# Patient Record
Sex: Female | Born: 1935 | Race: White | Hispanic: No | Smoking: Never smoker
Health system: Southern US, Community
[De-identification: ages and names within clinical notes are randomized; demographics above are authoritative.]

## PROBLEM LIST (undated history)

## (undated) DIAGNOSIS — I10 Essential (primary) hypertension: Secondary | ICD-10-CM

## (undated) DIAGNOSIS — E7800 Pure hypercholesterolemia, unspecified: Secondary | ICD-10-CM

## (undated) DIAGNOSIS — R251 Tremor, unspecified: Secondary | ICD-10-CM

## (undated) DIAGNOSIS — E119 Type 2 diabetes mellitus without complications: Secondary | ICD-10-CM

## (undated) HISTORY — PX: EYE SURGERY: SHX253

## (undated) HISTORY — PX: CHOLECYSTECTOMY: SHX55

## (undated) HISTORY — PX: BLADDER SUSPENSION: SHX72

## (undated) HISTORY — PX: BACK SURGERY: SHX140

## (undated) HISTORY — PX: TONSILLECTOMY: SUR1361

## (undated) HISTORY — PX: APPENDECTOMY: SHX54

## (undated) HISTORY — PX: ABDOMINAL HYSTERECTOMY: SHX81

---

## 2013-12-07 ENCOUNTER — Emergency Department (HOSPITAL_BASED_OUTPATIENT_CLINIC_OR_DEPARTMENT_OTHER)
Admission: EM | Admit: 2013-12-07 | Discharge: 2013-12-07 | Disposition: A | Discharge status: Home / Self Care | Payer: Medicare Other | Attending: Emergency Medicine | Admitting: Emergency Medicine

## 2013-12-07 ENCOUNTER — Emergency Department (HOSPITAL_BASED_OUTPATIENT_CLINIC_OR_DEPARTMENT_OTHER): Payer: Medicare Other

## 2013-12-07 ENCOUNTER — Encounter (HOSPITAL_BASED_OUTPATIENT_CLINIC_OR_DEPARTMENT_OTHER): Payer: Self-pay | Admitting: Emergency Medicine

## 2013-12-07 DIAGNOSIS — E119 Type 2 diabetes mellitus without complications: Secondary | ICD-10-CM | POA: Insufficient documentation

## 2013-12-07 DIAGNOSIS — S0993XA Unspecified injury of face, initial encounter: Secondary | ICD-10-CM

## 2013-12-07 DIAGNOSIS — Z79899 Other long term (current) drug therapy: Secondary | ICD-10-CM | POA: Insufficient documentation

## 2013-12-07 DIAGNOSIS — Z23 Encounter for immunization: Secondary | ICD-10-CM | POA: Insufficient documentation

## 2013-12-07 DIAGNOSIS — I10 Essential (primary) hypertension: Secondary | ICD-10-CM | POA: Insufficient documentation

## 2013-12-07 DIAGNOSIS — R930 Abnormal findings on diagnostic imaging of skull and head, not elsewhere classified: Secondary | ICD-10-CM | POA: Insufficient documentation

## 2013-12-07 DIAGNOSIS — E7800 Pure hypercholesterolemia, unspecified: Secondary | ICD-10-CM | POA: Insufficient documentation

## 2013-12-07 DIAGNOSIS — S199XXA Unspecified injury of neck, initial encounter: Secondary | ICD-10-CM

## 2013-12-07 DIAGNOSIS — W1809XA Striking against other object with subsequent fall, initial encounter: Secondary | ICD-10-CM | POA: Insufficient documentation

## 2013-12-07 DIAGNOSIS — S51809A Unspecified open wound of unspecified forearm, initial encounter: Secondary | ICD-10-CM | POA: Insufficient documentation

## 2013-12-07 DIAGNOSIS — Y9289 Other specified places as the place of occurrence of the external cause: Secondary | ICD-10-CM | POA: Insufficient documentation

## 2013-12-07 DIAGNOSIS — S51819A Laceration without foreign body of unspecified forearm, initial encounter: Secondary | ICD-10-CM

## 2013-12-07 DIAGNOSIS — Y9389 Activity, other specified: Secondary | ICD-10-CM | POA: Insufficient documentation

## 2013-12-07 HISTORY — DX: Type 2 diabetes mellitus without complications: E11.9

## 2013-12-07 HISTORY — DX: Pure hypercholesterolemia, unspecified: E78.00

## 2013-12-07 HISTORY — DX: Essential (primary) hypertension: I10

## 2013-12-07 MED ORDER — ACETAMINOPHEN 500 MG PO TABS
1000.0000 mg | ORAL_TABLET | Freq: Once | ORAL | Status: AC
  Administered 2013-12-07: 1000 mg via ORAL
  Filled 2013-12-07: qty 2

## 2013-12-07 MED ORDER — TETANUS-DIPHTH-ACELL PERTUSSIS 5-2.5-18.5 LF-MCG/0.5 IM SUSP
0.5000 mL | Freq: Once | INTRAMUSCULAR | Status: AC
  Administered 2013-12-07: 0.5 mL via INTRAMUSCULAR
  Filled 2013-12-07: qty 0.5

## 2013-12-07 NOTE — ED Notes (Signed)
Patient fell in her bathroom and has a wound to her right arm with pain in arm and shoulder, hit her head on tile floor.

## 2013-12-07 NOTE — Discharge Instructions (Signed)
Take tylenol for pain.   Wound check with your doctor in a week. Steri strips may fall off on its own.   Keep wound clean and dry.   Return to ER if you have purulent drainage from the wound, fever, severe pain, headache.

## 2013-12-07 NOTE — ED Notes (Signed)
I irrigated wound with normal saline, then weak solution of iodine with saline under slight pressure with tubing from i.v. and syringe. I pat dried the wound with 4x4 material, I then pulled skin flaps back into place with Steri-strips. I completed dressing the wound with non-adherant pad with a heavy wrap of large cotton webril, secured with tape. Patient's comfort greatly improved.

## 2013-12-07 NOTE — ED Provider Notes (Signed)
CSN: [id number]     Arrival date & time 12/07/13  1841 History  This chart was scribed for Richardean Canal, MD by Danella Maiers, ED Scribe. This patient was seen in room MH11/MH11 and the patient's care was started at 8:29 PM.    Chief Complaint  Patient presents with  . Fall   The history is provided by the patient. No language interpreter was used.   HPI Comments: Kiara Blair is a 80 y.o. female with a h/o DM who presents to the Emergency Department complaining of falling on the floor while getting out of the bathtub today. She states she hit her head on the bathroom tile floor but denies LOC. She also reports a wound to her left forearm. She is unsure of the date of her last tetanus vaccine. She is not on any blood thinners. She states her sugars have been good.   Past Medical History  Diagnosis Date  . Diabetes mellitus without complication   . Hypertension   . Hypercholesteremia    No past surgical history on file. No family history on file. History  Substance Use Topics  . Smoking status: Not on file  . Smokeless tobacco: Not on file  . Alcohol Use: Not on file   OB History   Grav Para Term Preterm Abortions TAB SAB Ect Mult Living                 Review of Systems  Skin: Positive for wound.  All other systems reviewed and are negative.      Allergies  Review of patient's allergies indicates no known allergies.  Home Medications   Current Outpatient Rx  Name  Route  Sig  Dispense  Refill  . amLODipine-benazepril (LOTREL) 5-20 MG per capsule   Oral   Take 1 capsule by mouth daily.         Marland Kitchen glyBURIDE-metformin (GLUCOVANCE) 5-500 MG per tablet   Oral   Take 1 tablet by mouth daily with breakfast.          BP 105/88  Pulse 82  Temp(Src) 98.6 F (37 C) (Oral)  Resp 18  Ht 5\' 3"  (1.6 m)  Wt 137 lb (62.143 kg)  BMI 24.27 kg/m2  SpO2 99% Physical Exam  Nursing note and vitals reviewed. Constitutional: She is oriented to person, place, and time. She  appears well-developed and well-nourished. No distress.  HENT:  Head: Normocephalic and atraumatic.  Mild tenderness on the left forehead and left cheek area but no obvious deformity.  Eyes: EOM are normal.  Neck: Normal range of motion. Neck supple.  Cardiovascular: Normal rate.   Pulmonary/Chest: Effort normal and breath sounds normal. No respiratory distress.  Musculoskeletal: Normal range of motion.  Bilateral hips normal ROM  Lymphadenopathy:    She has no cervical adenopathy.  Neurological: She is alert and oriented to person, place, and time.  Skin: Skin is warm and dry.  Wound to the left forearm 1 inch that goes down into the fat with surrounding skin abrasion. muscle fascia is intact.  Psychiatric: She has a normal mood and affect. Her behavior is normal.    ED Course  Procedures (including critical care time) Medications  Tdap (BOOSTRIX) injection 0.5 mL (0.5 mLs Intramuscular Given 12/07/13 2113)  acetaminophen (TYLENOL) tablet 1,000 mg (1,000 mg Oral Given 12/07/13 2112)    DIAGNOSTIC STUDIES: Oxygen Saturation is 99% on RA, normal by my interpretation.    COORDINATION OF CARE: 8:36 PM- Discussed treatment plan with  pt. Pt agrees to plan.  LACERATION REPAIR Performed by: Chaney Mallingavid Otis Burress, MD Consent: Verbal consent obtained. Risks and benefits: risks, benefits and alternatives were discussed Patient identity confirmed: provided demographic data Time out performed prior to procedure Prepped and Draped in normal sterile fashion Wound explored Laceration Location: left forearm Laceration Length: 3 cm No Foreign Bodies seen or palpated Anesthesia: none Irrigation method: syringe Amount of cleaning: standard Skin closure: steri strips Patient tolerance: Patient tolerated the procedure well with no immediate complications.     Labs Review Labs Reviewed - No data to display Imaging Review Dg Forearm Left  12/07/2013   CLINICAL DATA:  Fall.  Left forearm injury.  Left  forearm pain.  EXAM: LEFT FOREARM - 2 VIEW  COMPARISON:  None.  FINDINGS: Bandage is present over the radial aspect of the forearm. Osteopenia. Radius and ulna intact. No fracture is identified. 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  IMPRESSION: No osseous injury.   Electronically Signed   By: Andreas NewportGeoffrey  Lamke M.D.   On: 12/07/2013 19:30   Ct Head Wo Contrast  12/07/2013   CLINICAL DATA:  Fall.  EXAM: CT HEAD WITHOUT CONTRAST  CT MAXILLOFACIAL WITHOUT CONTRAST  TECHNIQUE: Multidetector CT imaging of the head and maxillofacial structures were performed using the standard protocol without intravenous contrast. Multiplanar CT image reconstructions of the maxillofacial structures were also generated.  COMPARISON:  None.  FINDINGS: CT HEAD FINDINGS  Skull and Sinuses:No calvarial fracture.  Sinuses described below.  Orbits: No acute abnormality.  Brain: No evidence of acute abnormality, such as acute infarction, hemorrhage, hydrocephalus, or mass lesion/mass effect. Age appropriate generalized cerebral volume loss.  CT MAXILLOFACIAL FINDINGS  Negative for acute facial fracture. No acute globe or retrobulbar injury. No significant soft tissue findings in the face.  Inflammatory mucosal thickening the paranasal sinuses, especially the frontoethmoidal recesses and anterior ethmoids. No sinus effusion. No bony erosion.  IMPRESSION: 1. No evidence of acute intracranial injury or facial fracture. 2. Chronic inflammatory paranasal sinus disease.   Electronically Signed   By: Tiburcio PeaJonathan  Watts M.D.   On: 12/07/2013 21:31   Ct Maxillofacial Wo Cm  12/07/2013   CLINICAL DATA:  Fall.  EXAM: CT HEAD WITHOUT CONTRAST  CT MAXILLOFACIAL WITHOUT CONTRAST  TECHNIQUE: Multidetector CT imaging of the head and maxillofacial structures were performed using the standard protocol without intravenous contrast. Multiplanar CT image reconstructions of the maxillofacial structures were also generated.   COMPARISON:  None.  FINDINGS: CT HEAD FINDINGS  Skull and Sinuses:No calvarial fracture.  Sinuses described below.  Orbits: No acute abnormality.  Brain: No evidence of acute abnormality, such as acute infarction, hemorrhage, hydrocephalus, or mass lesion/mass effect. Age appropriate generalized cerebral volume loss.  CT MAXILLOFACIAL FINDINGS  Negative for acute facial fracture. No acute globe or retrobulbar injury. No significant soft tissue findings in the face.  Inflammatory mucosal thickening the paranasal sinuses, especially the frontoethmoidal recesses and anterior ethmoids. No sinus effusion. No bony erosion.  IMPRESSION: 1. No evidence of acute intracranial injury or facial fracture. 2. Chronic inflammatory paranasal sinus disease.   Electronically Signed   By: Tiburcio PeaJonathan  Watts M.D.   On: 12/07/2013 21:31     EKG Interpretation None      MDM   Final diagnoses:  None  Kiara Blair is a 80 y.o. female here with mechanical fall with facial injury and L forearm laceration. CT head/face unremarkable. L forearm laceration had some skin missing. It approximates well so steri  strips applied after irrigation. Wound check in a week. Tetanus updated.   I personally performed the services described in this documentation, which was scribed in my presence. The recorded information has been reviewed and is accurate.   Richardean Canal, MD 12/07/13 2139

## 2014-07-15 ENCOUNTER — Emergency Department (HOSPITAL_BASED_OUTPATIENT_CLINIC_OR_DEPARTMENT_OTHER)
Admission: EM | Admit: 2014-07-15 | Discharge: 2014-07-15 | Disposition: A | Discharge status: Home / Self Care | Payer: Medicare Other | Attending: Emergency Medicine | Admitting: Emergency Medicine

## 2014-07-15 ENCOUNTER — Emergency Department (HOSPITAL_BASED_OUTPATIENT_CLINIC_OR_DEPARTMENT_OTHER): Payer: Medicare Other

## 2014-07-15 ENCOUNTER — Encounter (HOSPITAL_BASED_OUTPATIENT_CLINIC_OR_DEPARTMENT_OTHER): Payer: Self-pay | Admitting: Emergency Medicine

## 2014-07-15 DIAGNOSIS — W1830XA Fall on same level, unspecified, initial encounter: Secondary | ICD-10-CM | POA: Diagnosis not present

## 2014-07-15 DIAGNOSIS — R51 Headache: Secondary | ICD-10-CM | POA: Insufficient documentation

## 2014-07-15 DIAGNOSIS — S0993XA Unspecified injury of face, initial encounter: Secondary | ICD-10-CM | POA: Diagnosis present

## 2014-07-15 DIAGNOSIS — I10 Essential (primary) hypertension: Secondary | ICD-10-CM | POA: Diagnosis not present

## 2014-07-15 DIAGNOSIS — E041 Nontoxic single thyroid nodule: Secondary | ICD-10-CM | POA: Diagnosis not present

## 2014-07-15 DIAGNOSIS — S0083XA Contusion of other part of head, initial encounter: Secondary | ICD-10-CM

## 2014-07-15 DIAGNOSIS — Y92000 Kitchen of unspecified non-institutional (private) residence as  the place of occurrence of the external cause: Secondary | ICD-10-CM | POA: Insufficient documentation

## 2014-07-15 DIAGNOSIS — S0093XA Contusion of unspecified part of head, initial encounter: Secondary | ICD-10-CM | POA: Insufficient documentation

## 2014-07-15 DIAGNOSIS — Y9301 Activity, walking, marching and hiking: Secondary | ICD-10-CM | POA: Insufficient documentation

## 2014-07-15 DIAGNOSIS — E119 Type 2 diabetes mellitus without complications: Secondary | ICD-10-CM | POA: Insufficient documentation

## 2014-07-15 MED ORDER — TRAMADOL HCL 50 MG PO TABS: 50.0000 mg | ORAL_TABLET | Freq: Four times a day (QID) | ORAL | Status: DC | PRN

## 2014-07-15 NOTE — ED Notes (Addendum)
Pt reports falling at home and hit face against floor. Pt presents with left side bruises and injury to upper maxillary. Bleeding is controled. Pt denies dizziness, loss of consciousness or taking anticoagulants,. Hx of HTN and diabetes.

## 2014-07-15 NOTE — Discharge Instructions (Signed)
Contusion °A contusion is a deep bruise. Contusions are the result of an injury that caused bleeding under the skin. The contusion may turn blue, purple, or yellow. Minor injuries will give you a painless contusion, but more severe contusions may stay painful and swollen for a few weeks.  °CAUSES  °A contusion is usually caused by a blow, trauma, or direct force to an area of the body. °SYMPTOMS  °· Swelling and redness of the injured area. °· Bruising of the injured area. °· Tenderness and soreness of the injured area. °· Pain. °DIAGNOSIS  °The diagnosis can be made by taking a history and physical exam. An X-ray, CT scan, or MRI may be needed to determine if there were any associated injuries, such as fractures. °TREATMENT  °Specific treatment will depend on what area of the body was injured. In general, the best treatment for a contusion is resting, icing, elevating, and applying cold compresses to the injured area. Over-the-counter medicines may also be recommended for pain control. Ask your caregiver what the best treatment is for your contusion. °HOME CARE INSTRUCTIONS  °· Put ice on the injured area. °¨ Put ice in a plastic bag. °¨ Place a towel between your skin and the bag. °¨ Leave the ice on for 15-20 minutes, 3-4 times a day, or as directed by your health care provider. °· Only take over-the-counter or prescription medicines for pain, discomfort, or fever as directed by your caregiver. Your caregiver may recommend avoiding anti-inflammatory medicines (aspirin, ibuprofen, and naproxen) for 48 hours because these medicines may increase bruising. °· Rest the injured area. °· If possible, elevate the injured area to reduce swelling. °SEEK IMMEDIATE MEDICAL CARE IF:  °· You have increased bruising or swelling. °· You have pain that is getting worse. °· Your swelling or pain is not relieved with medicines. °MAKE SURE YOU:  °· Understand these instructions. °· Will watch your condition. °· Will get help right  away if you are not doing well or get worse. °Document Released: 07/05/2005 Document Revised: 09/30/2013 Document Reviewed: 07/31/2011 °[employer]® Patient Information ©2015 [employer]. This information is not intended to replace advice given to you by your health care provider. Make sure you discuss any questions you have with your health care provider. ° °

## 2014-07-15 NOTE — ED Provider Notes (Signed)
CSN: [id number]     Arrival date & time 07/15/14  4098 History   First MD Initiated Contact with Patient 07/15/14 [phone]     Chief Complaint  Patient presents with  . Fall  . Facial Injury    HPI Comments: The patient was walking around this morning in the kitchen. She was walking around on a linoleum floor in her stocking feet. She slipped and fell striking her face on the ground. She has a mild headache but primarily is having pain on the right side of her mouth in the maxillary region. She had blood in her mouth when she first got up. She denies any neck pain, confusion, numbness or weakness. She denies any other pain in her extremities. She has been able to walk without difficulty.  Patient is a 80 y.o. female presenting with fall and facial injury. The history is provided by the patient.  Fall This is a new problem. Episode onset: This morning. Associated symptoms include headaches.  Facial Injury Mechanism of injury:  Fall Location:  Face and mouth Mouth location:  Lip(s), teeth and gum(s) Pain details:    Quality:  Aching and sharp   Severity:  Mild   Timing:  Constant Chronicity:  New Foreign body present:  No foreign bodies Worsened by:  Pressure Associated symptoms: headaches   Associated symptoms: no altered mental status, no difficulty breathing, no double vision, no epistaxis, no malocclusion, no neck pain, no trismus and no vomiting  Loss of consciousness: she is not sure.     Past Medical History  Diagnosis Date  . Diabetes mellitus without complication   . Hypertension   . Hypercholesteremia    No past surgical history on file. No family history on file. History  Substance Use Topics  . Smoking status: Never Smoker   . Smokeless tobacco: Not on file  . Alcohol Use: Not on file   OB History   Grav Para Term Preterm Abortions TAB SAB Ect Mult Living                 Review of Systems  HENT: Negative for nosebleeds.   Eyes: Negative for double vision.    Gastrointestinal: Negative for vomiting.  Musculoskeletal: Negative for neck pain.  Neurological: Positive for headaches. Loss of consciousness: she is not sure.  All other systems reviewed and are negative.     Allergies  Review of patient's allergies indicates no known allergies.  Home Medications   Prior to Admission medications   Medication Sig Start Date End Date Taking? Authorizing Provider  amLODipine-benazepril (LOTREL) 5-20 MG per capsule Take 1 capsule by mouth daily.    Historical Provider, MD  glyBURIDE-metformin (GLUCOVANCE) 5-500 MG per tablet Take 1 tablet by mouth daily with breakfast.    Historical Provider, MD  traMADol (ULTRAM) 50 MG tablet Take 1 tablet (50 mg total) by mouth every 6 (six) hours as needed. 07/15/14   Linwood Dibbles, MD   BP 155/72  Pulse 78  Temp(Src) 98.2 F (36.8 C) (Oral)  Resp 16  Ht 5\' 3"  (1.6 m)  Wt 129 lb (58.514 kg)  BMI 22.86 kg/m2  SpO2 97% Physical Exam  Nursing note and vitals reviewed. Constitutional: She appears well-developed and well-nourished. No distress.  HENT:  Head: Normocephalic and atraumatic.    Right Ear: External ear normal.  Left Ear: External ear normal.  Small area of bruising and edema of the right upper lip, small superficial mucosal laceration in that same area, no gingival  bleeding, no displaced teeth  Eyes: Conjunctivae are normal. Right eye exhibits no discharge. Left eye exhibits no discharge. No scleral icterus.  Neck: Neck supple. No spinous process tenderness present. No tracheal deviation present.  Cardiovascular: Normal rate, regular rhythm and intact distal pulses.   Pulmonary/Chest: Effort normal and breath sounds normal. No stridor. No respiratory distress. She has no wheezes. She has no rales.  Abdominal: Soft. Bowel sounds are normal. She exhibits no distension. There is no tenderness. There is no rebound and no guarding.  Musculoskeletal: She exhibits no edema and no tenderness.       Cervical  back: Normal.       Thoracic back: Normal.       Lumbar back: Normal.  Full range of motion in all extremities without pain or discomfort  Neurological: She is alert. She has normal strength. No cranial nerve deficit (no facial droop, extraocular movements intact, no slurred speech) or sensory deficit. She exhibits normal muscle tone. She displays no seizure activity. Coordination normal.  Skin: Skin is warm and dry. No rash noted.  Psychiatric: She has a normal mood and affect.    ED Course  Procedures (including critical care time) Labs Review Labs Reviewed - No data to display  Imaging Review Ct Head Wo Contrast  07/15/2014   CLINICAL DATA:  Status post fall this morning striking the right side of the faint now will now with swelling of the right cheek and upper lip  EXAM: CT HEAD WITHOUT CONTRAST  CT MAXILLOFACIAL WITHOUT CONTRAST  CT CERVICAL SPINE WITHOUT CONTRAST  TECHNIQUE: Multidetector CT imaging of the head, cervical spine, and maxillofacial structures were performed using the standard protocol without intravenous contrast. Multiplanar CT image reconstructions of the cervical spine and maxillofacial structures were also generated.  COMPARISON:  CT of the brain and facial bones dated December 07, 2013.  FINDINGS: CT HEAD FINDINGS  The ventricles are normal in size and position. There is no intracranial hemorrhage nor intracranial mass effect. There is no acute ischemic change. The cerebellum and brainstem are normal.  The observed paranasal sinuses exhibit no air-fluid levels. There is mucoperiosteal thickening within the right and left frontal, several ethmoid, and sphenoid sinus cells. The mastoid air cells are well pneumatized. There is no acute skull fracture.  CT MAXILLOFACIAL FINDINGS  The nasal bone and nasal septum are intact. The zygomatic arches and pterygoid plates are intact. The bony orbits are intact. The paranasal sinus walls are normal. There are degenerative changes of the  temporomandibular joints bilaterally. There is no acute mandibular fracture.  CT CERVICAL SPINE FINDINGS  The cervical vertebral bodies are preserved in height. The prevertebral soft tissue spaces are normal. There is degenerative disc space narrowing at C2-3, C4-5, and C5-6. There are small endplate osteophytes anteriorly and laterally at these levels. There is no perched facet nor spinous process fracture. The odontoid is intact. The pulmonary apices are clear. There is marked enlargement of the right thyroid lobe with a dominant hypodense structure occupying much of its volume measuring 2.9 cm AP x 2.3 cm transversely x 3.6 cm longitudinally.  IMPRESSION: 1. There is no acute intracranial hemorrhage nor other acute intracranial abnormality. There is no acute skull fracture. 2. There is no acute fracture of the bony orbits nor of the other facial bones. 3. There is no acute cervical spine fracture nor dislocation. There is mild degenerative disc change at multiple levels. 4. There is enlargement of the right thyroid lobe consistent with a multinodular  goiter. The most recent ultrasound all record is from August of 2012. Elective follow-up ultrasound is recommended.   Electronically Signed   By: David  [place]   On: 07/15/2014 11:14   Ct Cervical Spine Wo Contrast  07/15/2014   CLINICAL DATA:  Initial encounter for fall this morning with swelling to right cheek and upper lip. Also with shoulder pain.  EXAM: CT CERVICAL SPINE WITHOUT CONTRAST  TECHNIQUE: Multidetector CT imaging of the cervical spine was performed without intravenous contrast. Multiplanar CT image reconstructions were also generated.  COMPARISON:  None.  FINDINGS: Imaging was obtained from the skullbase through the T2 vertebral body. No fracture. No subluxation. Loss of disc height is seen at C4-5 and C5-6. There is facet degeneration bilaterally at C4-5 with bilateral uncinate spurring at C5-6 causing bilateral neural foraminal encroachment at  C5-6. No prevertebral soft tissue swelling. Normal cervical lordosis is preserved.  3.0 cm right thyroid mass is evident. There is probably a smaller 10 mm nodule in the left thyroid gland.  IMPRESSION: Degenerative changes in the midcervical spine without cervical spine fracture.  3 cm right thyroid mass. Thyroid ultrasound recommended to further evaluate.   Electronically Signed   By: Kennith Center M.D.   On: 07/15/2014 11:12   Ct Maxillofacial Wo Cm  07/15/2014   CLINICAL DATA:  Status post fall this morning striking the right side of the faint now will now with swelling of the right cheek and upper lip  EXAM: CT HEAD WITHOUT CONTRAST  CT MAXILLOFACIAL WITHOUT CONTRAST  CT CERVICAL SPINE WITHOUT CONTRAST  TECHNIQUE: Multidetector CT imaging of the head, cervical spine, and maxillofacial structures were performed using the standard protocol without intravenous contrast. Multiplanar CT image reconstructions of the cervical spine and maxillofacial structures were also generated.  COMPARISON:  CT of the brain and facial bones dated December 07, 2013.  FINDINGS: CT HEAD FINDINGS  The ventricles are normal in size and position. There is no intracranial hemorrhage nor intracranial mass effect. There is no acute ischemic change. The cerebellum and brainstem are normal.  The observed paranasal sinuses exhibit no air-fluid levels. There is mucoperiosteal thickening within the right and left frontal, several ethmoid, and sphenoid sinus cells. The mastoid air cells are well pneumatized. There is no acute skull fracture.  CT MAXILLOFACIAL FINDINGS  The nasal bone and nasal septum are intact. The zygomatic arches and pterygoid plates are intact. The bony orbits are intact. The paranasal sinus walls are normal. There are degenerative changes of the temporomandibular joints bilaterally. There is no acute mandibular fracture.  CT CERVICAL SPINE FINDINGS  The cervical vertebral bodies are preserved in height. The prevertebral soft  tissue spaces are normal. There is degenerative disc space narrowing at C2-3, C4-5, and C5-6. There are small endplate osteophytes anteriorly and laterally at these levels. There is no perched facet nor spinous process fracture. The odontoid is intact. The pulmonary apices are clear. There is marked enlargement of the right thyroid lobe with a dominant hypodense structure occupying much of its volume measuring 2.9 cm AP x 2.3 cm transversely x 3.6 cm longitudinally.  IMPRESSION: 1. There is no acute intracranial hemorrhage nor other acute intracranial abnormality. There is no acute skull fracture. 2. There is no acute fracture of the bony orbits nor of the other facial bones. 3. There is no acute cervical spine fracture nor dislocation. There is mild degenerative disc change at multiple levels. 4. There is enlargement of the right thyroid lobe consistent with a multinodular  goiter. The most recent ultrasound all record is from August of 2012. Elective follow-up ultrasound is recommended.   Electronically Signed   By: David  [place]   On: 07/15/2014 11:14     EKG Interpretation None      MDM   Final diagnoses:  Facial contusion, initial encounter  Thyroid nodule    Follow up with PCP regarding thyroid nodule.  No facial fracture or serious injury associated with her fall.  Ultram prn.  Ice to help with swelling    Linwood Dibbles, MD 07/15/14 1126

## 2015-01-02 ENCOUNTER — Emergency Department (HOSPITAL_COMMUNITY): Payer: Medicare Other

## 2015-01-02 ENCOUNTER — Emergency Department (HOSPITAL_COMMUNITY)
Admission: EM | Admit: 2015-01-02 | Discharge: 2015-01-02 | Disposition: A | Discharge status: Home / Self Care | Payer: Medicare Other | Attending: Emergency Medicine | Admitting: Emergency Medicine

## 2015-01-02 ENCOUNTER — Encounter (HOSPITAL_COMMUNITY): Payer: Self-pay | Admitting: *Deleted

## 2015-01-02 DIAGNOSIS — I10 Essential (primary) hypertension: Secondary | ICD-10-CM | POA: Diagnosis not present

## 2015-01-02 DIAGNOSIS — E119 Type 2 diabetes mellitus without complications: Secondary | ICD-10-CM | POA: Insufficient documentation

## 2015-01-02 DIAGNOSIS — Z79899 Other long term (current) drug therapy: Secondary | ICD-10-CM | POA: Insufficient documentation

## 2015-01-02 DIAGNOSIS — R220 Localized swelling, mass and lump, head: Secondary | ICD-10-CM | POA: Insufficient documentation

## 2015-01-02 LAB — CBC
HCT: 39.9 % (ref 36.0–46.0)
Hemoglobin: 13 g/dL (ref 12.0–15.0)
MCH: 28.5 pg (ref 26.0–34.0)
MCHC: 32.6 g/dL (ref 30.0–36.0)
MCV: 87.5 fL (ref 78.0–100.0)
Platelets: 227 10*3/uL (ref 150–400)
RBC: 4.56 MIL/uL (ref 3.87–5.11)
RDW: 14.1 % (ref 11.5–15.5)
WBC: 6.4 10*3/uL (ref 4.0–10.5)

## 2015-01-02 LAB — BASIC METABOLIC PANEL
ANION GAP: 7 (ref 5–15)
BUN: 22 mg/dL (ref 6–23)
CO2: 26 mmol/L (ref 19–32)
CREATININE: 0.77 mg/dL (ref 0.50–1.10)
Calcium: 9.6 mg/dL (ref 8.4–10.5)
Chloride: 104 mmol/L (ref 96–112)
GFR calc Af Amer: 88 mL/min — ABNORMAL LOW (ref >90)
GFR calc non Af Amer: 76 mL/min — ABNORMAL LOW (ref >90)
GLUCOSE: 155 mg/dL — AB (ref 70–99)
Potassium: 4 mmol/L (ref 3.5–5.1)
SODIUM: 137 mmol/L (ref 135–145)

## 2015-01-02 MED ORDER — IOHEXOL 300 MG/ML  SOLN
100.0000 mL | Freq: Once | INTRAMUSCULAR | Status: AC | PRN
  Administered 2015-01-02: 100 mL via INTRAVENOUS

## 2015-01-02 MED ORDER — PENICILLIN V POTASSIUM 500 MG PO TABS: 500.0000 mg | ORAL_TABLET | Freq: Three times a day (TID) | ORAL | Status: DC

## 2015-01-02 NOTE — Discharge Instructions (Signed)
Return to the ED with any concerns including fever/chills, increased swelling, redness of face or eye, vomiting and not able to keep down liquids or pain medication, decreased level of alertness/lethargy, or any other alarming symptoms

## 2015-01-02 NOTE — ED Provider Notes (Signed)
CSN: [id number]     Arrival date & time 01/02/15  [id number] History   First MD Initiated Contact with Patient 01/02/15 1032     Chief Complaint  Patient presents with  . Facial Swelling     (Consider location/radiation/quality/duration/timing/severity/associated sxs/prior Treatment) HPI  Pt presenting with c/o right sided facial swelling when she awoke this morning.  She saw her right cheek swollen and puffy as well as her right eyelid and right upper lip.  She has a small amount of pain behind her right eye and small amount of pain in her right upper gum.  No fever/chills.  No changes in vision.  No pain with eye movements.  She has not had any treatment prior to arrival.  The swelling has mostly resolved after waking up today.  No blurry or double vision.  No focal weakness or numbness.  There are no other associated systemic symptoms, there are no other alleviating or modifying factors.   Past Medical History  Diagnosis Date  . Diabetes mellitus without complication   . Hypertension   . Hypercholesteremia    Past Surgical History  Procedure Laterality Date  . Abdominal hysterectomy    . Tonsillectomy    . Bladder suspension    . Appendectomy    . Cholecystectomy     No family history on file. History  Substance Use Topics  . Smoking status: Never Smoker   . Smokeless tobacco: Not on file  . Alcohol Use: No   OB History    No data available     Review of Systems  ROS reviewed and all otherwise negative except for mentioned in HPI    Allergies  Review of patient's allergies indicates no known allergies.  Home Medications   Prior to Admission medications   Medication Sig Start Date End Date Taking? Authorizing Provider  amLODipine-benazepril (LOTREL) 5-20 MG per capsule Take 1 capsule by mouth daily.   Yes Historical Provider, MD  Multiple Vitamin (MULTIVITAMIN WITH MINERALS) TABS tablet Take 1 tablet by mouth daily. Dr. Linton FlemingsWhitakers Vitamins.   Yes Historical Provider, MD   PROTEIN PO Take 1 each by mouth daily. Premium Protein shake.   Yes Historical Provider, MD  glyBURIDE-metformin (GLUCOVANCE) 5-500 MG per tablet Take 1 tablet by mouth daily with breakfast.    Historical Provider, MD  penicillin v potassium (VEETID) 500 MG tablet Take 1 tablet (500 mg total) by mouth 3 (three) times daily. 01/02/15   Jerelyn ScottMartha Linker, MD  traMADol (ULTRAM) 50 MG tablet Take 1 tablet (50 mg total) by mouth every 6 (six) hours as needed. Patient not taking: Reported on 01/02/2015 07/15/14   Linwood DibblesJon Knapp, MD   BP 148/51 mmHg  Pulse 69  Temp(Src) 98.4 F (36.9 C) (Oral)  Resp 17  SpO2 98%  Vitals reviewed Physical Exam  Physical Examination: General appearance - alert, well appearing, and in no distress Mental status - alert, oriented to person, place, and time Eyes - pupils equal and reactive, extraocular eye movements intact, no conjunctival injection Face- slight puffiness of right side of face- cheek/lip/eyelid, no erythema Mouth - mucous membranes moist, pharynx normal without lesions, ttp at superior margin of gingiva over lateral incisor- no frank abscess Neck - supple, no significant adenopathy Chest - clear to auscultation, no wheezes, rales or rhonchi, symmetric air entry Heart - normal rate, regular rhythm, normal S1, S2, no murmurs, rubs, clicks or gallops Neurological - alert, oriented x3, normal speech, no cranial nerve defect, strength 5/5 in extremities x  4, sensation intact Extremities - peripheral pulses normal, no pedal edema, no clubbing or cyanosis Skin - normal coloration and turgor, no rashes  ED Course  Procedures (including critical care time)  2:47 PM CT scan read did not cross over into epic.  D/w radiology- they have read CT scan as normal.  Labs Review Labs Reviewed  BASIC METABOLIC PANEL - Abnormal; Notable for the following:    Glucose, Bld 155 (*)    GFR calc non Af Amer 76 (*)    GFR calc Af Amer 88 (*)    All other components within normal  limits  CBC    Imaging Review Ct Orbits W/cm  01/02/2015   CLINICAL DATA:  Patient with facial swelling and right eye pain.  EXAM: CT ORBITS WITH CONTRAST  TECHNIQUE: Multidetector CT imaging of the orbits was performed following the bolus administration of intravenous contrast.  CONTRAST:  OMNIPAQUE IOHEXOL 300 MG/ML  SOLN  COMPARISON:  CT brain 07/15/2014  FINDINGS: The orbits are unremarkable bilaterally. There is no periorbital soft tissue swelling. The optic nerves are symmetric in size. No retrocrural fat stranding. There is mucosal thickening involving the frontal sinus, ethmoid air cells and right maxillary sinus. The calvarium is intact.  IMPRESSION: Grossly unremarkable examination. No periorbital or retrocrural soft tissue stranding/swelling.  Paranasal sinus mucosal thickening.   Electronically Signed   By: Annia Belt M.D.   On: 01/02/2015 13:20     EKG Interpretation None      MDM   Final diagnoses:  Facial swelling    Pt awoke this morning with facial swelling which has mostly resolved at this time.  She did have some eye redness- CT orbits show no orbital cellulitis.  There is small area of pain in upper gingiva just under nose- this may be  Source of infection or small abscess that caused swelling.  Pt placed on penicillin and advised to f/u with dentist.  Neuro exam is intact, no cranial nerve defect to suggest stroke.  No facial cellulitis.  Discharged with strict return precautions.  Pt agreeable with plan.    Jerelyn Scott, MD 01/03/15 [phone]

## 2015-01-02 NOTE — ED Notes (Addendum)
Pt reports waking up with R sided facial swelling and numbness and R eye "just not feeling right." Sts swelling has gone down and numbness has lessened but remains around R mouth and nose. Felt fine going to bed last night. Pt was concerned because her husband recently died shortly after having a stroke. Stroke screen negative in triage.

## 2015-10-22 DIAGNOSIS — G250 Essential tremor: Secondary | ICD-10-CM | POA: Insufficient documentation

## 2015-10-22 DIAGNOSIS — E785 Hyperlipidemia, unspecified: Secondary | ICD-10-CM | POA: Diagnosis present

## 2016-01-10 DIAGNOSIS — E1142 Type 2 diabetes mellitus with diabetic polyneuropathy: Secondary | ICD-10-CM | POA: Diagnosis present

## 2016-01-31 ENCOUNTER — Emergency Department (HOSPITAL_BASED_OUTPATIENT_CLINIC_OR_DEPARTMENT_OTHER): Payer: Medicare Other

## 2016-01-31 ENCOUNTER — Encounter (HOSPITAL_BASED_OUTPATIENT_CLINIC_OR_DEPARTMENT_OTHER): Payer: Self-pay | Admitting: *Deleted

## 2016-01-31 DIAGNOSIS — I10 Essential (primary) hypertension: Secondary | ICD-10-CM | POA: Insufficient documentation

## 2016-01-31 DIAGNOSIS — E119 Type 2 diabetes mellitus without complications: Secondary | ICD-10-CM | POA: Diagnosis not present

## 2016-01-31 DIAGNOSIS — Y939 Activity, unspecified: Secondary | ICD-10-CM | POA: Diagnosis not present

## 2016-01-31 DIAGNOSIS — S5011XA Contusion of right forearm, initial encounter: Secondary | ICD-10-CM | POA: Insufficient documentation

## 2016-01-31 DIAGNOSIS — Y929 Unspecified place or not applicable: Secondary | ICD-10-CM | POA: Diagnosis not present

## 2016-01-31 DIAGNOSIS — Y999 Unspecified external cause status: Secondary | ICD-10-CM | POA: Diagnosis not present

## 2016-01-31 DIAGNOSIS — W010XXA Fall on same level from slipping, tripping and stumbling without subsequent striking against object, initial encounter: Secondary | ICD-10-CM | POA: Insufficient documentation

## 2016-01-31 DIAGNOSIS — R42 Dizziness and giddiness: Secondary | ICD-10-CM | POA: Diagnosis present

## 2016-01-31 NOTE — ED Notes (Signed)
Dizziness x 2 weeks. She tripped and fell 2 weeks ago and sustained an injury to her right arm. Bruising and abrasion noted. She has had 20 pound weight loss since February. She came to the ED tonight because she feels nervous and not quite right in the head. No confusion just feels dizzy.

## 2016-02-01 ENCOUNTER — Emergency Department (HOSPITAL_BASED_OUTPATIENT_CLINIC_OR_DEPARTMENT_OTHER)
Admission: EM | Admit: 2016-02-01 | Discharge: 2016-02-01 | Disposition: A | Discharge status: Home / Self Care | Payer: Medicare Other | Attending: Emergency Medicine | Admitting: Emergency Medicine

## 2016-02-01 ENCOUNTER — Emergency Department (HOSPITAL_BASED_OUTPATIENT_CLINIC_OR_DEPARTMENT_OTHER): Payer: Medicare Other

## 2016-02-01 DIAGNOSIS — R42 Dizziness and giddiness: Secondary | ICD-10-CM

## 2016-02-01 DIAGNOSIS — S5011XA Contusion of right forearm, initial encounter: Secondary | ICD-10-CM | POA: Diagnosis not present

## 2016-02-01 LAB — COMPREHENSIVE METABOLIC PANEL
ALT: 16 U/L (ref 14–54)
ANION GAP: 9 (ref 5–15)
AST: 22 U/L (ref 15–41)
Albumin: 4.1 g/dL (ref 3.5–5.0)
Alkaline Phosphatase: 96 U/L (ref 38–126)
BUN: 23 mg/dL — ABNORMAL HIGH (ref 6–20)
CHLORIDE: 106 mmol/L (ref 101–111)
CO2: 22 mmol/L (ref 22–32)
CREATININE: 0.69 mg/dL (ref 0.44–1.00)
Calcium: 9.7 mg/dL (ref 8.9–10.3)
GFR calc Af Amer: >60 mL/min (ref >60)
GFR calc non Af Amer: >60 mL/min (ref >60)
Glucose, Bld: 126 mg/dL — ABNORMAL HIGH (ref 65–99)
Potassium: 3.7 mmol/L (ref 3.5–5.1)
SODIUM: 137 mmol/L (ref 135–145)
Total Bilirubin: 0.5 mg/dL (ref 0.3–1.2)
Total Protein: 6.9 g/dL (ref 6.5–8.1)

## 2016-02-01 LAB — CBC WITH DIFFERENTIAL/PLATELET
BASOS PCT: 1 %
Basophils Absolute: 0 10*3/uL (ref 0.0–0.1)
Eosinophils Absolute: 0.3 10*3/uL (ref 0.0–0.7)
Eosinophils Relative: 4 %
HEMATOCRIT: 38.4 % (ref 36.0–46.0)
HEMOGLOBIN: 13 g/dL (ref 12.0–15.0)
LYMPHS ABS: 2.4 10*3/uL (ref 0.7–4.0)
Lymphocytes Relative: 35 %
MCH: 28.8 pg (ref 26.0–34.0)
MCHC: 33.9 g/dL (ref 30.0–36.0)
MCV: 85 fL (ref 78.0–100.0)
MONOS PCT: 10 %
Monocytes Absolute: 0.7 10*3/uL (ref 0.1–1.0)
NEUTROS ABS: 3.3 10*3/uL (ref 1.7–7.7)
NEUTROS PCT: 50 %
Platelets: 242 10*3/uL (ref 150–400)
RBC: 4.52 MIL/uL (ref 3.87–5.11)
RDW: 14.7 % (ref 11.5–15.5)
WBC: 6.6 10*3/uL (ref 4.0–10.5)

## 2016-02-01 LAB — URINE MICROSCOPIC-ADD ON: RBC / HPF: NONE SEEN RBC/hpf (ref 0–5)

## 2016-02-01 LAB — URINALYSIS, ROUTINE W REFLEX MICROSCOPIC
Bilirubin Urine: NEGATIVE
Glucose, UA: NEGATIVE mg/dL
Hgb urine dipstick: NEGATIVE
Ketones, ur: NEGATIVE mg/dL
Nitrite: NEGATIVE
PROTEIN: NEGATIVE mg/dL
Specific Gravity, Urine: 1.007 (ref 1.005–1.030)
pH: 6.5 (ref 5.0–8.0)

## 2016-02-01 LAB — TROPONIN I

## 2016-02-01 MED ORDER — MECLIZINE HCL 25 MG PO TABS
25.0000 mg | ORAL_TABLET | Freq: Once | ORAL | Status: AC
  Administered 2016-02-01: 25 mg via ORAL
  Filled 2016-02-01: qty 1

## 2016-02-01 MED ORDER — SODIUM CHLORIDE 0.9 % IV BOLUS (SEPSIS)
500.0000 mL | Freq: Once | INTRAVENOUS | Status: AC
  Administered 2016-02-01: 500 mL via INTRAVENOUS

## 2016-02-01 MED ORDER — MECLIZINE HCL 25 MG PO TABS: 25.0000 mg | ORAL_TABLET | Freq: Three times a day (TID) | ORAL | Status: DC | PRN

## 2016-02-01 NOTE — Discharge Instructions (Signed)
Meclizine as prescribed as needed for dizziness.  Follow-up with your primary Dr. if not improving in the next 3-4 days, and return to the ER if symptoms significantly worsen or change.   Dizziness Dizziness is a common problem. It is a feeling of unsteadiness or light-headedness. You may feel like you are about to faint. Dizziness can lead to injury if you stumble or fall. Anyone can become dizzy, but dizziness is more common in older adults. This condition can be caused by a number of things, including medicines, dehydration, or illness. HOME CARE INSTRUCTIONS Taking these steps may help with your condition: Eating and Drinking  Drink enough fluid to keep your urine clear or pale yellow. This helps to keep you from becoming dehydrated. Try to drink more clear fluids, such as water.  Do not drink alcohol.  Limit your caffeine intake if directed by your health care provider.  Limit your salt intake if directed by your health care provider. Activity  Avoid making quick movements.  Rise slowly from chairs and steady yourself until you feel okay.  In the morning, first sit up on the side of the bed. When you feel okay, stand slowly while you hold onto something until you know that your balance is fine.  Move your legs often if you need to stand in one place for a long time. Tighten and relax your muscles in your legs while you are standing.  Do not drive or operate heavy machinery if you feel dizzy.  Avoid bending down if you feel dizzy. Place items in your home so that they are easy for you to reach without leaning over. Lifestyle  Do not use any tobacco products, including cigarettes, chewing tobacco, or electronic cigarettes. If you need help quitting, ask your health care provider.  Try to reduce your stress level, such as with yoga or meditation. Talk with your health care provider if you need help. General Instructions  Watch your dizziness for any changes.  Take medicines  only as directed by your health care provider. Talk with your health care provider if you think that your dizziness is caused by a medicine that you are taking.  Tell a friend or a family member that you are feeling dizzy. If he or she notices any changes in your behavior, have this person call your health care provider.  Keep all follow-up visits as directed by your health care provider. This is important. SEEK MEDICAL CARE IF:  Your dizziness does not go away.  Your dizziness or light-headedness gets worse.  You feel nauseous.  You have reduced hearing.  You have new symptoms.  You are unsteady on your feet or you feel like the room is spinning. SEEK IMMEDIATE MEDICAL CARE IF:  You vomit or have diarrhea and are unable to eat or drink anything.  You have problems talking, walking, swallowing, or using your arms, hands, or legs.  You feel generally weak.  You are not thinking clearly or you have trouble forming sentences. It may take a friend or family member to notice this.  You have chest pain, abdominal pain, shortness of breath, or sweating.  Your vision changes.  You notice any bleeding.  You have a headache.  You have neck pain or a stiff neck.  You have a fever.   This information is not intended to replace advice given to you by your health care provider. Make sure you discuss any questions you have with your health care provider.   Document  Released: 03/21/2001 Document Revised: 02/09/2015 Document Reviewed: 09/21/2014 Elsevier Interactive Patient Education [employer].

## 2016-02-01 NOTE — ED Provider Notes (Signed)
CSN: [id number]     Arrival date & time 01/31/16  2208 History   First MD Initiated Contact with Patient 02/01/16 0117     Chief Complaint  Patient presents with  . Dizziness     (Consider location/radiation/quality/duration/timing/severity/associated sxs/prior Treatment) HPI Comments: Patient is an 82 year old female with past mental history of diabetes, hypertension, high cholesterol. She presents for evaluation of dizziness. This has been ongoing for the past 2 weeks and is worsening. It occurs intermittently, usually when she stands or changes position. She is unable to describe the quality of her dizziness, just that she feels off balance. She denies any syncope or loss of consciousness. She denies any palpitations.  Her husband passed away approximately one year ago and since that time the patient has lost her appetite and has not been eating. Her son at bedside states that she has lost proximally 20 pounds since this time.  Patient is a 82 y.o. female presenting with dizziness. The history is provided by the patient.  Dizziness Quality:  Head spinning and imbalance Severity:  Moderate Onset quality:  Gradual Duration:  2 weeks Timing:  Intermittent Progression:  Worsening Chronicity:  New Relieved by:  Nothing Worsened by:  Movement, standing up and turning head Ineffective treatments:  None tried Associated symptoms: no headaches, no nausea, no palpitations, no shortness of breath and no vision changes     Past Medical History  Diagnosis Date  . Diabetes mellitus without complication (HCC)   . Hypertension   . Hypercholesteremia    Past Surgical History  Procedure Laterality Date  . Abdominal hysterectomy    . Tonsillectomy    . Bladder suspension    . Appendectomy    . Cholecystectomy     No family history on file. Social History  Substance Use Topics  . Smoking status: Never Smoker   . Smokeless tobacco: None  . Alcohol Use: No   OB History    No data  available     Review of Systems  Respiratory: Negative for shortness of breath.   Cardiovascular: Negative for palpitations.  Gastrointestinal: Negative for nausea.  Neurological: Positive for dizziness. Negative for headaches.  All other systems reviewed and are negative.     Allergies  Review of patient's allergies indicates no known allergies.  Home Medications   Prior to Admission medications   Medication Sig Start Date End Date Taking? Authorizing Provider  amLODipine-benazepril (LOTREL) 5-20 MG per capsule Take 1 capsule by mouth daily.    Historical Provider, MD  glyBURIDE-metformin (GLUCOVANCE) 5-500 MG per tablet Take 1 tablet by mouth daily with breakfast.    Historical Provider, MD  Multiple Vitamin (MULTIVITAMIN WITH MINERALS) TABS tablet Take 1 tablet by mouth daily. Dr. Linton FlemingsWhitakers Vitamins.    Historical Provider, MD  penicillin v potassium (VEETID) 500 MG tablet Take 1 tablet (500 mg total) by mouth 3 (three) times daily. 01/02/15   Jerelyn ScottMartha Linker, MD  PROTEIN PO Take 1 each by mouth daily. Premium Protein shake.    Historical Provider, MD  traMADol (ULTRAM) 50 MG tablet Take 1 tablet (50 mg total) by mouth every 6 (six) hours as needed. Patient not taking: Reported on 01/02/2015 07/15/14   Linwood DibblesJon Knapp, MD   BP 167/84 mmHg  Pulse 69  Temp(Src) 97.8 F (36.6 C) (Oral)  Resp 20  Ht 5' 1.5" (1.562 m)  Wt 118 lb (53.524 kg)  BMI 21.94 kg/m2  SpO2 97% Physical Exam  Constitutional: She is oriented to person, place, and  time. She appears well-developed and well-nourished. No distress.  HENT:  Head: Normocephalic and atraumatic.  Mouth/Throat: Oropharynx is clear and moist.  TMs are clear bilaterally.  Eyes: EOM are normal. Pupils are equal, round, and reactive to light.  Neck: Normal range of motion. Neck supple.  Cardiovascular: Normal rate and regular rhythm.  Exam reveals no gallop and no friction rub.   No murmur heard. Pulmonary/Chest: Effort normal and breath  sounds normal. No respiratory distress. She has no wheezes.  Abdominal: Soft. Bowel sounds are normal. She exhibits no distension. There is no tenderness.  Musculoskeletal: Normal range of motion.  Neurological: She is alert and oriented to person, place, and time. No cranial nerve deficit. She exhibits normal muscle tone. Coordination normal.  Skin: Skin is warm and dry. She is not diaphoretic.  Nursing note and vitals reviewed.   ED Course  Procedures (including critical care time) Labs Review Labs Reviewed  COMPREHENSIVE METABOLIC PANEL  CBC WITH DIFFERENTIAL/PLATELET  TROPONIN I  URINALYSIS, ROUTINE W REFLEX MICROSCOPIC (NOT AT [facility])    Imaging Review Dg Elbow Complete Right  01/31/2016  CLINICAL DATA:  Fall 4 days ago with posterior forearm nonhealing wound. Initial encounter. EXAM: RIGHT ELBOW - COMPLETE 3+ VIEW COMPARISON:  None. FINDINGS: There is no evidence of fracture, dislocation, or joint effusion. No opaque foreign body. IMPRESSION: Negative. Electronically Signed   By: Marnee Spring M.D.   On: 01/31/2016 22:58   Dg Forearm Right  01/31/2016  CLINICAL DATA:  Fall 4 days ago with nonhealing posterior forearm wound. Initial encounter. EXAM: RIGHT FOREARM - 2 VIEW COMPARISON:  None. FINDINGS: No acute fracture or dislocation. Ulnar negative variance with ulnar head spurring. No opaque foreign body. Osteopenia. IMPRESSION: No fracture or opaque foreign body. Electronically Signed   By: Marnee Spring M.D.   On: 01/31/2016 22:59   I have personally reviewed and evaluated these images and lab results as part of my medical decision-making.   EKG Interpretation   Date/Time:  Monday January 31 2016 22:33:26 EDT Ventricular Rate:  61 PR Interval:  132 QRS Duration: 100 QT Interval:  400 QTC Calculation: 402 R Axis:   75 Text Interpretation:  Normal sinus rhythm Normal ECG Confirmed by Saanvi Hakala   MD, Rechel Delosreyes ([id number]) on 02/01/2016 2:49:46 AM      MDM   Final diagnoses:   None    Patient presents with complaints of dizziness and disequilibrium that has been worsening over the past 2 weeks. It occurs worse with motion and change in position. Her workup reveals no anemia or electrolyte disturbance, urinalysis is clear, and head CT is negative. This may well be related to a peripheral vertigo. She will be treated with meclizine and when necessary return.    Geoffery Lyons, MD 02/01/16 [phone]

## 2016-06-26 DIAGNOSIS — K219 Gastro-esophageal reflux disease without esophagitis: Secondary | ICD-10-CM | POA: Diagnosis present

## 2016-12-31 ENCOUNTER — Encounter (HOSPITAL_BASED_OUTPATIENT_CLINIC_OR_DEPARTMENT_OTHER): Payer: Self-pay | Admitting: Emergency Medicine

## 2016-12-31 ENCOUNTER — Emergency Department (HOSPITAL_BASED_OUTPATIENT_CLINIC_OR_DEPARTMENT_OTHER)
Admission: EM | Admit: 2016-12-31 | Discharge: 2016-12-31 | Disposition: A | Discharge status: Home / Self Care | Payer: Medicare Other | Attending: Emergency Medicine | Admitting: Emergency Medicine

## 2016-12-31 ENCOUNTER — Emergency Department (HOSPITAL_BASED_OUTPATIENT_CLINIC_OR_DEPARTMENT_OTHER): Payer: Medicare Other

## 2016-12-31 DIAGNOSIS — Z79899 Other long term (current) drug therapy: Secondary | ICD-10-CM | POA: Insufficient documentation

## 2016-12-31 DIAGNOSIS — B9789 Other viral agents as the cause of diseases classified elsewhere: Secondary | ICD-10-CM

## 2016-12-31 DIAGNOSIS — I10 Essential (primary) hypertension: Secondary | ICD-10-CM | POA: Diagnosis not present

## 2016-12-31 DIAGNOSIS — E119 Type 2 diabetes mellitus without complications: Secondary | ICD-10-CM | POA: Insufficient documentation

## 2016-12-31 DIAGNOSIS — Z7984 Long term (current) use of oral hypoglycemic drugs: Secondary | ICD-10-CM | POA: Insufficient documentation

## 2016-12-31 DIAGNOSIS — J069 Acute upper respiratory infection, unspecified: Secondary | ICD-10-CM | POA: Diagnosis not present

## 2016-12-31 DIAGNOSIS — R05 Cough: Secondary | ICD-10-CM | POA: Diagnosis present

## 2016-12-31 MED ORDER — HYDROCOD POLST-CPM POLST ER 10-8 MG/5ML PO SUER
5.0000 mL | Freq: Once | ORAL | Status: AC
  Administered 2016-12-31: 5 mL via ORAL
  Filled 2016-12-31: qty 5

## 2016-12-31 MED ORDER — BENZONATATE 100 MG PO CAPS: 100.0000 mg | ORAL_CAPSULE | Freq: Three times a day (TID) | ORAL | 0 refills | Status: DC

## 2016-12-31 MED ORDER — ACETAMINOPHEN 500 MG PO TABS
1000.0000 mg | ORAL_TABLET | Freq: Once | ORAL | Status: AC
  Administered 2016-12-31: 1000 mg via ORAL
  Filled 2016-12-31: qty 2

## 2016-12-31 MED ORDER — IBUPROFEN 800 MG PO TABS
800.0000 mg | ORAL_TABLET | Freq: Once | ORAL | Status: AC
  Administered 2016-12-31: 800 mg via ORAL
  Filled 2016-12-31: qty 1

## 2016-12-31 NOTE — ED Notes (Signed)
ED Provider at bedside. 

## 2016-12-31 NOTE — ED Provider Notes (Signed)
[facility] Provider Note   CSN: [id number] Arrival date & time: 12/31/16  1217     History   Chief Complaint Chief Complaint  Patient presents with  . Sore Throat  . Nasal Congestion  . Cough    HPI Kiara Blair is a 83 y.o. female.  83 yo F with a chief complaint of cough congestion and sore throat subjective fevers. Going on for the past couple days. Denies myalgias denies vomiting or diarrhea. Denies abdominal pain. Patient has some left-sided sharp chest pain when she coughs. Denies sick contacts.   The history is provided by the patient.  Sore Throat  This is a new problem. The current episode started 2 days ago. The problem occurs constantly. The problem has not changed since onset.Pertinent negatives include no chest pain, no headaches and no shortness of breath. Nothing aggravates the symptoms. Nothing relieves the symptoms. She has tried nothing for the symptoms. The treatment provided no relief.  Cough  Associated symptoms include sore throat. Pertinent negatives include no chest pain, no chills, no headaches, no rhinorrhea, no myalgias, no shortness of breath, no wheezing and no eye redness.    Past Medical History:  Diagnosis Date  . Diabetes mellitus without complication (HCC)   . Hypercholesteremia   . Hypertension     There are no active problems to display for this patient.   Past Surgical History:  Procedure Laterality Date  . ABDOMINAL HYSTERECTOMY    . APPENDECTOMY    . BLADDER SUSPENSION    . CHOLECYSTECTOMY    . TONSILLECTOMY      OB History    No data available       Home Medications    Prior to Admission medications   Medication Sig Start Date End Date Taking? Authorizing Provider  amLODipine-benazepril (LOTREL) 5-20 MG per capsule Take 1 capsule by mouth daily.    Historical Provider, MD  benzonatate (TESSALON) 100 MG capsule Take 1 capsule (100 mg total) by mouth every 8 (eight) hours. 12/31/16   Melene Planan Zsofia Prout, DO    glyBURIDE-metformin (GLUCOVANCE) 5-500 MG per tablet Take 1 tablet by mouth daily with breakfast.    Historical Provider, MD  meclizine (ANTIVERT) 25 MG tablet Take 1 tablet (25 mg total) by mouth 3 (three) times daily as needed for dizziness. 02/01/16   Geoffery Lyonsouglas Delo, MD  Multiple Vitamin (MULTIVITAMIN WITH MINERALS) TABS tablet Take 1 tablet by mouth daily. Dr. Linton FlemingsWhitakers Vitamins.    Historical Provider, MD  penicillin v potassium (VEETID) 500 MG tablet Take 1 tablet (500 mg total) by mouth 3 (three) times daily. 01/02/15   Jerelyn ScottMartha Linker, MD  PROTEIN PO Take 1 each by mouth daily. Premium Protein shake.    Historical Provider, MD  traMADol (ULTRAM) 50 MG tablet Take 1 tablet (50 mg total) by mouth every 6 (six) hours as needed. Patient not taking: Reported on 01/02/2015 07/15/14   Linwood DibblesJon Knapp, MD    Family History History reviewed. No pertinent family history.  Social History Social History  Substance Use Topics  . Smoking status: Never Smoker  . Smokeless tobacco: Never Used  . Alcohol use No     Allergies   Patient has no known allergies.   Review of Systems Review of Systems  Constitutional: Positive for fever. Negative for chills.  HENT: Positive for congestion and sore throat. Negative for rhinorrhea.   Eyes: Negative for redness and visual disturbance.  Respiratory: Positive for cough. Negative for shortness of breath and wheezing.  Cardiovascular: Negative for chest pain and palpitations.  Gastrointestinal: Negative for nausea and vomiting.  Genitourinary: Negative for dysuria and urgency.  Musculoskeletal: Negative for arthralgias and myalgias.  Skin: Negative for pallor and wound.  Neurological: Negative for dizziness and headaches.     Physical Exam Updated Vital Signs BP 124/75 (BP Location: Right Arm)   Pulse 80   Temp 99.1 F (37.3 C) (Oral) Comment: Took tylenol this AM for fever  Resp 20   Ht 5\' 1"  (1.549 m)   Wt 115 lb (52.2 kg)   SpO2 97%   BMI 21.73  kg/m   Physical Exam  Constitutional: She is oriented to person, place, and time. She appears well-developed and well-nourished. No distress.  HENT:  Head: Normocephalic and atraumatic.  Swollen turbinates, posterior nasal drip, mild erythema to posterior oropharynx, no noted sinus ttp, tm normal bilaterally.    Eyes: EOM are normal. Pupils are equal, round, and reactive to light.  Neck: Normal range of motion. Neck supple.  Cardiovascular: Normal rate and regular rhythm.  Exam reveals no gallop and no friction rub.   No murmur heard. Pulmonary/Chest: Effort normal. She has no wheezes. She has no rales. She exhibits no tenderness.  Abdominal: Soft. She exhibits no distension. There is no tenderness.  Musculoskeletal: She exhibits no edema or tenderness.  Neurological: She is alert and oriented to person, place, and time.  Skin: Skin is warm and dry. She is not diaphoretic.  Psychiatric: She has a normal mood and affect. Her behavior is normal.  Nursing note and vitals reviewed.    ED Treatments / Results  Labs (all labs ordered are listed, but only abnormal results are displayed) Labs Reviewed - No data to display  EKG  EKG Interpretation None       Radiology Dg Chest 2 View  Result Date: 12/31/2016 CLINICAL DATA:  Patient with cough.  Fever. EXAM: CHEST  2 VIEW COMPARISON:  None. FINDINGS: Normal cardiac and mediastinal contours. No consolidative pulmonary opacities. No pleural effusion or pneumothorax. Thoracic spine degenerative changes. Aortic vascular calcifications. IMPRESSION: No acute cardiopulmonary process.  Aortic vascular calcifications. Electronically Signed   By: Annia Belt M.D.   On: 12/31/2016 13:21    Procedures Procedures (including critical care time)  Medications Ordered in ED Medications  acetaminophen (TYLENOL) tablet 1,000 mg (1,000 mg Oral Given 12/31/16 1256)  ibuprofen (ADVIL,MOTRIN) tablet 800 mg (800 mg Oral Given 12/31/16 1256)    chlorpheniramine-HYDROcodone (TUSSIONEX) 10-8 MG/5ML suspension 5 mL (5 mLs Oral Given 12/31/16 1256)     Initial Impression / Assessment and Plan / ED Course  I have reviewed the triage vital signs and the nursing notes.  Pertinent labs & imaging results that were available during my care of the patient were reviewed by me and considered in my medical decision making (see chart for details).     83 yo F With a chief complaint of an upper respiratory infection. Well-appearing and nontoxic on my exam. Clear lung sounds. Will obtain a chest x-ray to rule out pneumonia. Treat symptomatically.  CXR negative.  D/c home.   2:41 PM:  I have discussed the diagnosis/risks/treatment options with the patient and family and believe the pt to be eligible for discharge home to follow-up with PCP. We also discussed returning to the ED immediately if new or worsening sx occur. We discussed the sx which are most concerning (e.g., sudden worsening pain, fever, inability to tolerate by mouth) that necessitate immediate return. Medications administered to the  patient during their visit and any new prescriptions provided to the patient are listed below.  Medications given during this visit Medications  acetaminophen (TYLENOL) tablet 1,000 mg (1,000 mg Oral Given 12/31/16 1256)  ibuprofen (ADVIL,MOTRIN) tablet 800 mg (800 mg Oral Given 12/31/16 1256)  chlorpheniramine-HYDROcodone (TUSSIONEX) 10-8 MG/5ML suspension 5 mL (5 mLs Oral Given 12/31/16 1256)     The patient appears reasonably screen and/or stabilized for discharge and I doubt any other medical condition or other [facility] requiring further screening, evaluation, or treatment in the ED at this time prior to discharge.    Final Clinical Impressions(s) / ED Diagnoses   Final diagnoses:  Viral URI with cough    New Prescriptions Discharge Medication List as of 12/31/2016  1:24 PM    START taking these medications   Details  benzonatate (TESSALON) 100  MG capsule Take 1 capsule (100 mg total) by mouth every 8 (eight) hours., Starting Sun 12/31/2016, Print         Melene Plan, DO 12/31/16 1441

## 2016-12-31 NOTE — ED Triage Notes (Signed)
Patient reports Thursday night she began having bilateral ear pain, sore throat, states "my head doesn't feel right".  Reports congestion and cough as well.  Reports episodes of vomiting from coughing so much.  Reports low grade fevers at home.

## 2016-12-31 NOTE — Discharge Instructions (Signed)
Take tylenol 2 pills 4 times a day and motrin 2 pills 3 times a day.  Drink plenty of fluids.  Return for worsening shortness of breath, headache, confusion. Follow up with your family doctor.   

## 2017-01-08 ENCOUNTER — Emergency Department (HOSPITAL_BASED_OUTPATIENT_CLINIC_OR_DEPARTMENT_OTHER)
Admission: EM | Admit: 2017-01-08 | Discharge: 2017-01-08 | Disposition: A | Discharge status: Home / Self Care | Payer: Medicare Other | Attending: Emergency Medicine | Admitting: Emergency Medicine

## 2017-01-08 ENCOUNTER — Encounter (HOSPITAL_BASED_OUTPATIENT_CLINIC_OR_DEPARTMENT_OTHER): Payer: Self-pay | Admitting: Emergency Medicine

## 2017-01-08 DIAGNOSIS — Z79899 Other long term (current) drug therapy: Secondary | ICD-10-CM | POA: Insufficient documentation

## 2017-01-08 DIAGNOSIS — R42 Dizziness and giddiness: Secondary | ICD-10-CM | POA: Insufficient documentation

## 2017-01-08 DIAGNOSIS — I10 Essential (primary) hypertension: Secondary | ICD-10-CM | POA: Insufficient documentation

## 2017-01-08 DIAGNOSIS — E119 Type 2 diabetes mellitus without complications: Secondary | ICD-10-CM | POA: Insufficient documentation

## 2017-01-08 MED ORDER — MECLIZINE HCL 25 MG PO TABS: 25.0000 mg | ORAL_TABLET | Freq: Three times a day (TID) | ORAL | 0 refills | Status: DC | PRN

## 2017-01-08 MED ORDER — MECLIZINE HCL 25 MG PO TABS
25.0000 mg | ORAL_TABLET | Freq: Once | ORAL | Status: AC
  Administered 2017-01-08: 25 mg via ORAL
  Filled 2017-01-08: qty 1

## 2017-01-08 NOTE — ED Provider Notes (Signed)
[facility] Provider Note: Kiara Dell, MD, FACEP  CSN: [id number] MRN: [id number] ARRIVAL: 01/08/17 at 0048 ROOM: [facility]09/[facility]09   CHIEF COMPLAINT  Dizziness   HISTORY OF PRESENT ILLNESS  Kiara Blair is a 83 y.o. female who complains of dizziness that began yesterday afternoon. The dizziness has been intermittent. It is described as the room spinning and a sense of being off balance. Symptoms are exacerbated by movement of the head or standing. Symptoms are improved by lying still. She is asymptomatic at the present time. Symptoms have been severe enough that she felt she would fall if she tried to walk. She has not taken any medication for this. There is been no associated nausea or vomiting. She denies tinnitus. 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 She was recently treated for cough with benzonatate.   Past Medical History:  Diagnosis Date  . Diabetes mellitus without complication (HCC)   . Hypercholesteremia   . Hypertension     Past Surgical History:  Procedure Laterality Date  . ABDOMINAL HYSTERECTOMY    . APPENDECTOMY    . BLADDER SUSPENSION    . CHOLECYSTECTOMY    . TONSILLECTOMY      No family history on file.  Social History  Substance Use Topics  . Smoking status: Never Smoker  . Smokeless tobacco: Never Used  . Alcohol use No    Prior to Admission medications   Medication Sig Start Date End Date Taking? Authorizing Provider  amLODipine-benazepril (LOTREL) 5-20 MG per capsule Take 1 capsule by mouth daily.    Historical Provider, MD  benzonatate (TESSALON) 100 MG capsule Take 1 capsule (100 mg total) by mouth every 8 (eight) hours. 12/31/16   Melene Plan, DO  glyBURIDE-metformin (GLUCOVANCE) 5-500 MG per tablet Take 1 tablet by mouth daily with breakfast.    Historical Provider, MD  meclizine (ANTIVERT) 25 MG tablet Take 1 tablet (25 mg total) by mouth 3 (three) times daily as needed for dizziness. 02/01/16    Geoffery Lyons, MD  Multiple Vitamin (MULTIVITAMIN WITH MINERALS) TABS tablet Take 1 tablet by mouth daily. Dr. Linton Flemings Vitamins.    Historical Provider, MD  penicillin v potassium (VEETID) 500 MG tablet Take 1 tablet (500 mg total) by mouth 3 (three) times daily. 01/02/15   Jerelyn Scott, MD  PROTEIN PO Take 1 each by mouth daily. Premium Protein shake.    Historical Provider, MD  traMADol (ULTRAM) 50 MG tablet Take 1 tablet (50 mg total) by mouth every 6 (six) hours as needed. Patient not taking: Reported on 01/02/2015 07/15/14   Linwood Dibbles, MD    Allergies Patient has no known allergies.   REVIEW OF SYSTEMS  Negative except as noted here or in the History of Present Illness.   PHYSICAL EXAMINATION  Initial Vital Signs Blood pressure (!) 165/61, pulse 71, temperature 98.1 F (36.7 C), temperature source Oral, resp. rate 20, SpO2 96 %.  Examination General: Well-developed, well-nourished female in no acute distress; appearance consistent with age of record HENT: normocephalic; atraumatic; TMs normal; deaf in right ear Eyes: pupils equal, round and reactive to light; extraocular muscles intact; no nystagmus Neck: supple Heart: regular rate and rhythm Lungs: clear to auscultation bilaterally Abdomen: soft; nondistended; nontender; no masses or hepatosplenomegaly; bowel sounds present Extremities: Arthritic changes; pulses normal Neurologic: Awake, alert and oriented; motor function intact in all extremities and symmetric; no facial droop; bilateral tremor Skin: Warm and dry Psychiatric: Normal  mood and affect   RESULTS  Summary of this visit's results, reviewed by myself:   EKG Interpretation  Date/Time:  Monday January 08 2017 00:55:59 EDT Ventricular Rate:  69 PR Interval:  124 QRS Duration: 94 QT Interval:  374 QTC Calculation: 400 R Axis:   75 Text Interpretation:  Normal sinus rhythm Normal ECG No significant change was found Confirmed by Khiya Friese  MD, Jonny Ruiz ([id number]) on  01/08/2017 1:28:40 AM      Laboratory Studies: No results found for this or any previous visit (from the past 24 hour(s)). Imaging Studies: No results found.  ED COURSE  Nursing notes and initial vitals signs, including pulse oximetry, reviewed.  Vitals:   01/08/17 0057 01/08/17 0130  BP: (!) 165/61 (!) 148/84  Pulse: 71 73  Resp: 20 16  Temp: 98.1 F (36.7 C)   TempSrc: Oral   SpO2: 96% 93%   2:19 AM Vertigo improved after meclizine. She has been able to ambulate with only mild symptomatology. We will prescribe meclizine and have her follow-up with her primary care physician.  PROCEDURES    ED DIAGNOSES     ICD-9-CM ICD-10-CM   1. Vertigo 780.4 R42        Paula Libra, MD 01/08/17 [phone]

## 2017-01-08 NOTE — ED Notes (Addendum)
EDP in to see pt. Pt/family deny questions or needs. Reports dizziness and cough improved. Denies nausea. Given Rx x1. VSS.

## 2017-01-08 NOTE — ED Triage Notes (Signed)
PT reports dizziness that started around 8pm tonight.

## 2017-01-08 NOTE — ED Notes (Signed)
Alert, NAD, calm, interactive, resps e/u, speaking in clear complete sentences, no dyspnea noted, skin W&D, VSS, c/o dizziness, being treated for eye infection and cough (septra and prednisone), (denies: pain, sob, nausea). Family at [facility]. Pt up to b/r with assist.

## 2017-01-08 NOTE — ED Notes (Signed)
EDP into room, prior to RN assessment, see MD notes, orders received and initiated.   

## 2017-03-13 DIAGNOSIS — H903 Sensorineural hearing loss, bilateral: Secondary | ICD-10-CM | POA: Insufficient documentation

## 2017-06-09 ENCOUNTER — Emergency Department (HOSPITAL_BASED_OUTPATIENT_CLINIC_OR_DEPARTMENT_OTHER): Payer: Medicare Other

## 2017-06-09 ENCOUNTER — Emergency Department (HOSPITAL_BASED_OUTPATIENT_CLINIC_OR_DEPARTMENT_OTHER)
Admission: EM | Admit: 2017-06-09 | Discharge: 2017-06-09 | Disposition: A | Discharge status: Home / Self Care | Payer: Medicare Other | Attending: Emergency Medicine | Admitting: Emergency Medicine

## 2017-06-09 ENCOUNTER — Encounter (HOSPITAL_BASED_OUTPATIENT_CLINIC_OR_DEPARTMENT_OTHER): Payer: Self-pay | Admitting: *Deleted

## 2017-06-09 DIAGNOSIS — I10 Essential (primary) hypertension: Secondary | ICD-10-CM | POA: Diagnosis not present

## 2017-06-09 DIAGNOSIS — R197 Diarrhea, unspecified: Secondary | ICD-10-CM | POA: Diagnosis not present

## 2017-06-09 DIAGNOSIS — T383X5A Adverse effect of insulin and oral hypoglycemic [antidiabetic] drugs, initial encounter: Secondary | ICD-10-CM | POA: Insufficient documentation

## 2017-06-09 DIAGNOSIS — T50905A Adverse effect of unspecified drugs, medicaments and biological substances, initial encounter: Secondary | ICD-10-CM

## 2017-06-09 DIAGNOSIS — Z79899 Other long term (current) drug therapy: Secondary | ICD-10-CM | POA: Insufficient documentation

## 2017-06-09 DIAGNOSIS — E119 Type 2 diabetes mellitus without complications: Secondary | ICD-10-CM | POA: Diagnosis not present

## 2017-06-09 DIAGNOSIS — Z7984 Long term (current) use of oral hypoglycemic drugs: Secondary | ICD-10-CM | POA: Insufficient documentation

## 2017-06-09 DIAGNOSIS — R1084 Generalized abdominal pain: Secondary | ICD-10-CM | POA: Diagnosis present

## 2017-06-09 LAB — COMPREHENSIVE METABOLIC PANEL
ALK PHOS: 101 U/L (ref 38–126)
ALT: 18 U/L (ref 14–54)
AST: 25 U/L (ref 15–41)
Albumin: 4 g/dL (ref 3.5–5.0)
Anion gap: 9 (ref 5–15)
BILIRUBIN TOTAL: 0.6 mg/dL (ref 0.3–1.2)
BUN: 25 mg/dL — AB (ref 6–20)
CALCIUM: 9.6 mg/dL (ref 8.9–10.3)
CO2: 25 mmol/L (ref 22–32)
CREATININE: 0.71 mg/dL (ref 0.44–1.00)
Chloride: 102 mmol/L (ref 101–111)
GFR calc Af Amer: >60 mL/min (ref >60)
Glucose, Bld: 238 mg/dL — ABNORMAL HIGH (ref 65–99)
Potassium: 3.9 mmol/L (ref 3.5–5.1)
Sodium: 136 mmol/L (ref 135–145)
TOTAL PROTEIN: 6.4 g/dL — AB (ref 6.5–8.1)

## 2017-06-09 LAB — CBC
HCT: 37.5 % (ref 36.0–46.0)
Hemoglobin: 12.6 g/dL (ref 12.0–15.0)
MCH: 28.1 pg (ref 26.0–34.0)
MCHC: 33.6 g/dL (ref 30.0–36.0)
MCV: 83.5 fL (ref 78.0–100.0)
PLATELETS: 227 10*3/uL (ref 150–400)
RBC: 4.49 MIL/uL (ref 3.87–5.11)
RDW: 14.1 % (ref 11.5–15.5)
WBC: 7.4 10*3/uL (ref 4.0–10.5)

## 2017-06-09 LAB — URINALYSIS, ROUTINE W REFLEX MICROSCOPIC
Bilirubin Urine: NEGATIVE
GLUCOSE, UA: 500 mg/dL — AB
Hgb urine dipstick: NEGATIVE
KETONES UR: NEGATIVE mg/dL
LEUKOCYTES UA: NEGATIVE
NITRITE: NEGATIVE
PROTEIN: NEGATIVE mg/dL
Specific Gravity, Urine: 1.01 (ref 1.005–1.030)
pH: 6.5 (ref 5.0–8.0)

## 2017-06-09 LAB — CBG MONITORING, ED: Glucose-Capillary: 221 mg/dL — ABNORMAL HIGH (ref 65–99)

## 2017-06-09 LAB — LIPASE, BLOOD: Lipase: 27 U/L (ref 11–51)

## 2017-06-09 LAB — TROPONIN I: Troponin I: <0.03 ng/mL (ref <0.03)

## 2017-06-09 LAB — I-STAT CG4 LACTIC ACID, ED: LACTIC ACID, VENOUS: 0.77 mmol/L (ref 0.5–1.9)

## 2017-06-09 MED ORDER — SODIUM CHLORIDE 0.9 % IV BOLUS (SEPSIS)
1000.0000 mL | Freq: Once | INTRAVENOUS | Status: AC
Start: 2017-06-09 — End: 2017-06-09
  Administered 2017-06-09: 1000 mL via INTRAVENOUS

## 2017-06-09 MED ORDER — IOPAMIDOL (ISOVUE-300) INJECTION 61%
100.0000 mL | Freq: Once | INTRAVENOUS | Status: AC | PRN
  Administered 2017-06-09: 80 mL via INTRAVENOUS

## 2017-06-09 NOTE — Discharge Instructions (Signed)
Your diarrhea is likely related to the metformin that you just started taking. Your CT scan and blood work is very reassuring.   Please stop taking your new diabetes medication, and please call your primary care doctor on Tuesday to discuss what you should do regarding your diabetes medication  Return without fail for worsening symptoms, including fever, confusion, escalating pain, or any other symptoms concerning to you.

## 2017-06-09 NOTE — ED Notes (Signed)
Pt is drinking her contrast, notified her that urine sample is needed, she states that she voided PTA.  I will check with her after fluid bolus

## 2017-06-09 NOTE — ED Provider Notes (Signed)
[facility] Provider [facility]   CSN: [id number] Arrival date & time: 06/09/17  1318     History   Chief Complaint Chief Complaint  Patient presents with  . Abdominal Pain    HPI Kiara Blair is a 83 y.o. female.  The history is provided by the patient.  Abdominal Pain   This is a new problem. The current episode started yesterday. The problem occurs hourly. The problem has been gradually worsening. The pain is associated with an unknown factor. The pain is located in the generalized abdominal region and epigastric region. The pain is moderate. Associated symptoms include diarrhea. Pertinent negatives include fever, hematochezia, melena, nausea, vomiting, constipation and frequency. Nothing aggravates the symptoms.   83 year old female who presents with abdominal pain. She has a history of hypertension, hyperlipidemia and diabetes. Also has previous history of abdominal hysterectomy, appendectomy and cholecystectomy. Reports that she was started on a new diabetes medication, glyburide/metformin yesterday. She took her first dose at 7:45 AM, and shortly afterwards began having severe abdominal cramping and diarrhea. Cramping gradually worsening, associated with diaphoresis. Denies any nausea, vomiting, fevers or chills. Denies any melena or hematochezia. States that pain generalized and in the epigastrium. Denies any current pain. Denies dysuria, urinary frequency. Reports back pain, but nothing severe or different than chronic back pain. Has not tried any medications for her symptoms. No alleviating or aggravating factors. Past Medical History:  Diagnosis Date  . Diabetes mellitus without complication (HCC)   . Hypercholesteremia   . Hypertension     There are no active problems to display for this patient.   Past Surgical History:  Procedure Laterality Date  . ABDOMINAL HYSTERECTOMY    . APPENDECTOMY    . BLADDER SUSPENSION    . CHOLECYSTECTOMY    . TONSILLECTOMY       OB History    No data available       Home Medications    Prior to Admission medications   Medication Sig Start Date End Date Taking? Authorizing Provider  glyBURIDE-metformin (GLUCOVANCE) 2.5-500 MG tablet Take 1 tablet by mouth.   Yes [provider]  amLODipine-benazepril (LOTREL) 5-20 MG per capsule Take 1 capsule by mouth daily.    [provider]  benzonatate (TESSALON) 100 MG capsule Take 1 capsule (100 mg total) by mouth every 8 (eight) hours. 12/31/16   Melene PlanFloyd, Dan, DO  glyBURIDE-metformin (GLUCOVANCE) 5-500 MG per tablet Take 1 tablet by mouth daily with breakfast.    [provider]  meclizine (ANTIVERT) 25 MG tablet Take 1 tablet (25 mg total) by mouth 3 (three) times daily as needed for dizziness. 02/01/16   Geoffery Lyonselo, Douglas, MD  meclizine (ANTIVERT) 25 MG tablet Take 1 tablet (25 mg total) by mouth 3 (three) times daily as needed for dizziness. 01/08/17   Molpus, John, MD  Multiple Vitamin (MULTIVITAMIN WITH MINERALS) TABS tablet Take 1 tablet by mouth daily. Dr. Linton FlemingsWhitakers Vitamins.    [provider]  penicillin v potassium (VEETID) 500 MG tablet Take 1 tablet (500 mg total) by mouth 3 (three) times daily. 01/02/15   Phillis HaggisMabe, Martha L, MD  PROTEIN PO Take 1 each by mouth daily. Premium Protein shake.    [provider]    Family History No family history on file.  Social History Social History  Substance Use Topics  . Smoking status: Never Smoker  . Smokeless tobacco: Never Used  . Alcohol use No     Allergies   Patient has no known  allergies.   Review of Systems Review of Systems  Constitutional: Negative for fever.  Gastrointestinal: Positive for abdominal pain and diarrhea. Negative for constipation, hematochezia, melena, nausea and vomiting.  Genitourinary: Negative for frequency.  All other systems reviewed and are negative.    Physical Exam Updated Vital Signs BP (!) 136/93 (BP Location: Right Arm)   Pulse 74    Temp 97.7 F (36.5 C) (Oral)   Resp 17   SpO2 98%   Physical Exam Physical Exam  Nursing note and vitals reviewed. Constitutional: Elderly woman, non-toxic, and in no acute distress Head: Normocephalic and atraumatic.  Mouth/Throat: Oropharynx is clear and moist.  Neck: Normal range of motion. Neck supple.  Cardiovascular: Normal rate and regular rhythm.   Pulmonary/Chest: Effort normal and breath sounds normal.  Abdominal: Soft. There is no tenderness. There is no rebound and no guarding.  Musculoskeletal: Normal range of motion. no edema Neurological: Alert, no facial droop, fluent speech, moves all extremities symmetrically Skin: Skin is warm and dry.  Psychiatric: Cooperative   ED Treatments / Results  Labs (all labs ordered are listed, but only abnormal results are displayed) Labs Reviewed  COMPREHENSIVE METABOLIC PANEL - Abnormal; Notable for the following:       Result Value   Glucose, Bld 238 (*)    BUN 25 (*)    Total Protein 6.4 (*)    All other components within normal limits  URINALYSIS, ROUTINE W REFLEX MICROSCOPIC - Abnormal; Notable for the following:    Color, Urine GREEN (*)    Glucose, UA 500 (*)    All other components within normal limits  CBG MONITORING, ED - Abnormal; Notable for the following:    Glucose-Capillary 221 (*)    All other components within normal limits  LIPASE, BLOOD  CBC  TROPONIN I  I-STAT CG4 LACTIC ACID, ED    EKG  EKG Interpretation  Date/Time:  Saturday June 09 2017 13:29:25 EDT Ventricular Rate:  74 PR Interval:    QRS Duration: 125 QT Interval:  396 QTC Calculation: 440 R Axis:   102 Text Interpretation:  Sinus rhythm Nonspecific intraventricular conduction delay Probable anteroseptal infarct, old Borderline ST depression, diffuse leads Baseline wander in lead(s) I III aVL similar to previous EKG  Confirmed by Crista Curb [id number] on 06/09/2017 1:45:17 PM       Radiology Ct Abdomen Pelvis W Contrast  Result  Date: 06/09/2017 CLINICAL DATA:  Acute abdominal pain with diarrhea. Previous cholecystectomy, appendectomy and hysterectomy EXAM: CT ABDOMEN AND PELVIS WITH CONTRAST TECHNIQUE: Multidetector CT imaging of the abdomen and pelvis was performed using the standard protocol following bolus administration of intravenous contrast. CONTRAST:  80mL ISOVUE-300 IOPAMIDOL (ISOVUE-300) INJECTION 61% COMPARISON:  None available FINDINGS: Lower chest: Minor basilar atelectasis. Moderately large hiatal hernia. Normal heart size. No pericardial or pleural effusion. Hepatobiliary: Remote cholecystectomy. Diffuse biliary prominence, suspect postcholecystectomy related. Patent hepatic and portal veins. No other focal hepatic abnormality. Pancreas: Diffuse pancreatic atrophy. No inflammatory process or fluid collection. No ductal dilatation. Spleen: Normal in size without focal abnormality. Adrenals/Urinary Tract: Normal adrenal glands for age. No focal renal abnormality or obstruction. No hydronephrosis or hydroureter. Negative for obstructing ureteral calculus. Urinary bladder unremarkable. Stomach/Bowel: Negative for bowel obstruction, significant dilatation, ileus, or free air. Scattered colonic diverticulosis. Several portions of the colon are underdistended, accounting for wall prominence. This remains nonspecific. No definite acute inflammatory process. No fluid collection or abscess. Vascular/Lymphatic: Mesenteric and renal vasculature appear patent. Aortoiliac atherosclerosis and tortuosity noted. Negative  for aneurysm or occlusive process. No retroperitoneal hemorrhage or hematoma. No adenopathy. Reproductive: Remote hysterectomy. No adnexal mass. No pelvic free fluid or fluid collection. Other: No inguinal or abdominal wall hernia. Musculoskeletal: Bones are osteopenic. Degenerative changes of the spine. No acute osseous finding. IMPRESSION: No acute intra-abdominal or pelvic finding by CT. Large hiatal hernia Remote  cholecystectomy, appendectomy and hysterectomy Aortoiliac atherosclerosis Biliary prominence, suspect postcholecystectomy related. No definite obstruction. Scattered colonic diverticulosis without definite acute inflammatory process. Several portions of the colon are underdistended accounting for wall prominence. This remains nonspecific. Electronically Signed   By: Judie Petit.  Shick M.D.   On: 06/09/2017 15:19    Procedures Procedures (including critical care time)  Medications Ordered in ED Medications  sodium chloride 0.9 % bolus 1,000 mL (1,000 mLs Intravenous New Bag/Given 06/09/17 1354)  iopamidol (ISOVUE-300) 61 % injection 100 mL (80 mLs Intravenous Contrast Given 06/09/17 1441)     Initial Impression / Assessment and Plan / ED Course  I have reviewed the triage vital signs and the nursing notes.  Pertinent labs & imaging results that were available during my care of the patient were reviewed by me and considered in my medical decision making (see chart for details).     Thinning with intermittent abdominal pain perfused diarrhea after taking metformin. She states that she had the same reaction to this medication before, but was recommended by her daughter to try this combination pill.  At this time my evaluation, she is well-appearing and in no acute distress with normal vital signs. Her abdomen is soft and she is nontender. Her when CT abdomen and pelvis, which is visualized and shows no acute intra-abdominal process. Blood work is reassuring. There is normal lactic acid and no signs of major metabolic or electrolyte derangements.  She has remained pain free in ED, and continues to be well appearing.  Since she has had similar reaction to metformin in the past with likely adverse medication reaction. She is instructed to discontinue this medication, and call her primary care doctor's office on Tuesday to discuss changes in her medication. Strict return and follow-up instructions reviewed. She  expressed understanding of all discharge instructions and felt comfortable with the plan of care.   Final Clinical Impressions(s) / ED Diagnoses   Final diagnoses:  Adverse effect of drug, initial encounter  Diarrhea, unspecified type  Generalized abdominal pain    New Prescriptions New Prescriptions   No medications on file     Lavera Guise, MD 06/09/17 [phone]

## 2017-06-09 NOTE — ED Triage Notes (Signed)
Pt states that she thinks she is having a adverse reaction to a new medication.  She states that she started taking Glyburide/Metformin 2.5/500mg  yesterday.  She took her first dose in the am ( and took a second dose in the pm) and at noon she began having abdominal pain and diarrhea.  Pt states that her stomach was hurting very "hard and sharp". Pt states that she had 10 episodes of diarrhea and the pain was so severe that it caused her to cry.  Pt states that she is having generalized abdominal pain and epigastric pain.  Pt also states that she is so shaky that she can barely stand.  Pt also states that she had some diaphoresis.  Pt denies any GU symptoms, no nausea.  Pt is otherwise in good health and lives on her own and still drives and is independent.

## 2017-07-13 ENCOUNTER — Encounter (HOSPITAL_BASED_OUTPATIENT_CLINIC_OR_DEPARTMENT_OTHER): Payer: Self-pay | Admitting: *Deleted

## 2017-07-13 ENCOUNTER — Emergency Department (HOSPITAL_BASED_OUTPATIENT_CLINIC_OR_DEPARTMENT_OTHER): Payer: Medicare Other

## 2017-07-13 ENCOUNTER — Emergency Department (HOSPITAL_BASED_OUTPATIENT_CLINIC_OR_DEPARTMENT_OTHER)
Admission: EM | Admit: 2017-07-13 | Discharge: 2017-07-13 | Disposition: A | Discharge status: Home / Self Care | Payer: Medicare Other | Attending: Emergency Medicine | Admitting: Emergency Medicine

## 2017-07-13 DIAGNOSIS — Z79899 Other long term (current) drug therapy: Secondary | ICD-10-CM | POA: Diagnosis not present

## 2017-07-13 DIAGNOSIS — Y9389 Activity, other specified: Secondary | ICD-10-CM | POA: Insufficient documentation

## 2017-07-13 DIAGNOSIS — Z7984 Long term (current) use of oral hypoglycemic drugs: Secondary | ICD-10-CM | POA: Insufficient documentation

## 2017-07-13 DIAGNOSIS — W01198A Fall on same level from slipping, tripping and stumbling with subsequent striking against other object, initial encounter: Secondary | ICD-10-CM | POA: Insufficient documentation

## 2017-07-13 DIAGNOSIS — Y999 Unspecified external cause status: Secondary | ICD-10-CM | POA: Diagnosis not present

## 2017-07-13 DIAGNOSIS — S46912A Strain of unspecified muscle, fascia and tendon at shoulder and upper arm level, left arm, initial encounter: Secondary | ICD-10-CM | POA: Insufficient documentation

## 2017-07-13 DIAGNOSIS — I10 Essential (primary) hypertension: Secondary | ICD-10-CM | POA: Insufficient documentation

## 2017-07-13 DIAGNOSIS — Y92017 Garden or yard in single-family (private) house as the place of occurrence of the external cause: Secondary | ICD-10-CM | POA: Insufficient documentation

## 2017-07-13 DIAGNOSIS — S0003XA Contusion of scalp, initial encounter: Secondary | ICD-10-CM | POA: Insufficient documentation

## 2017-07-13 DIAGNOSIS — S80211A Abrasion, right knee, initial encounter: Secondary | ICD-10-CM | POA: Diagnosis not present

## 2017-07-13 DIAGNOSIS — E119 Type 2 diabetes mellitus without complications: Secondary | ICD-10-CM | POA: Diagnosis not present

## 2017-07-13 DIAGNOSIS — S0990XA Unspecified injury of head, initial encounter: Secondary | ICD-10-CM | POA: Diagnosis present

## 2017-07-13 DIAGNOSIS — W19XXXA Unspecified fall, initial encounter: Secondary | ICD-10-CM

## 2017-07-13 LAB — CBG MONITORING, ED: Glucose-Capillary: 201 mg/dL — ABNORMAL HIGH (ref 65–99)

## 2017-07-13 MED ORDER — DICLOFENAC SODIUM 1 % TD GEL: 2.0000 g | Freq: Two times a day (BID) | TRANSDERMAL | 0 refills | Status: DC | PRN

## 2017-07-13 MED ORDER — ACETAMINOPHEN 325 MG PO TABS
650.0000 mg | ORAL_TABLET | Freq: Once | ORAL | Status: AC
  Administered 2017-07-13: 650 mg via ORAL
  Filled 2017-07-13: qty 2

## 2017-07-13 NOTE — ED Triage Notes (Signed)
She tripped over a hose while in the garage and fell. She hit the back of her head on the garage floor. No LOC. Pain in her right knee and right elbow. Pain in her back. She was able to call her family to come help her get off the floor.

## 2017-07-13 NOTE — ED Notes (Signed)
Pt verbalizes understanding of d/c instructions and denies any further needs at this time. 

## 2017-07-13 NOTE — ED Notes (Signed)
Patient transported to X-ray 

## 2017-07-13 NOTE — Discharge Instructions (Signed)
Continue tylenol as needed for pain/.   Use voltaren gel as needed for back pain.   See your doctor. Follow up with orthopedic doctor regarding your shoulder   Return to ER if you have worse back and shoulder pain, trouble walking, headaches, vomiting, weakness.

## 2017-07-13 NOTE — ED Provider Notes (Signed)
[facility] Provider Note   CSN: [id number] Arrival date & time: 07/13/17  1545     History   Chief Complaint Chief Complaint  Patient presents with  . Fall    HPI Kiara Blair is a 83 y.o. female history of diabetes, high cholesterol, hypertension here presenting with fall. Patient states that she was in the garden and got tripped up with a hose and fell backwards and hit her head and her right knee and right elbow. Patient denies passing out or chest pain prior to this. Complains of right elbow pain, right knee pain, left shoulder pain and back pain and neck pain. No meds prior to arrival.  The history is provided by the patient.    Past Medical History:  Diagnosis Date  . Diabetes mellitus without complication (HCC)   . Hypercholesteremia   . Hypertension     There are no active problems to display for this patient.   Past Surgical History:  Procedure Laterality Date  . ABDOMINAL HYSTERECTOMY    . APPENDECTOMY    . BLADDER SUSPENSION    . CHOLECYSTECTOMY    . TONSILLECTOMY      OB History    No data available       Home Medications    Prior to Admission medications   Medication Sig Start Date End Date Taking? Authorizing Provider  amLODipine-benazepril (LOTREL) 5-20 MG per capsule Take 1 capsule by mouth daily.    [provider]  benzonatate (TESSALON) 100 MG capsule Take 1 capsule (100 mg total) by mouth every 8 (eight) hours. 12/31/16   Melene Plan, DO  glyBURIDE-metformin (GLUCOVANCE) 2.5-500 MG tablet Take 1 tablet by mouth.    [provider]  glyBURIDE-metformin (GLUCOVANCE) 5-500 MG per tablet Take 1 tablet by mouth daily with breakfast.    [provider]  meclizine (ANTIVERT) 25 MG tablet Take 1 tablet (25 mg total) by mouth 3 (three) times daily as needed for dizziness. 02/01/16   Geoffery Lyons, MD  meclizine (ANTIVERT) 25 MG tablet Take 1 tablet (25 mg total) by mouth 3 (three) times daily as needed for  dizziness. 01/08/17   Molpus, John, MD  Multiple Vitamin (MULTIVITAMIN WITH MINERALS) TABS tablet Take 1 tablet by mouth daily. Dr. Linton Flemings Vitamins.    [provider]  penicillin v potassium (VEETID) 500 MG tablet Take 1 tablet (500 mg total) by mouth 3 (three) times daily. 01/02/15   Phillis Haggis, MD  PROTEIN PO Take 1 each by mouth daily. Premium Protein shake.    [provider]    Family History No family history on file.  Social History Social History  Substance Use Topics  . Smoking status: Never Smoker  . Smokeless tobacco: Never Used  . Alcohol use No     Allergies   Patient has no known allergies.   Review of Systems Review of Systems  Musculoskeletal:       R elbow, R knee, L shoulder pain   All other systems reviewed and are negative.    Physical Exam Updated Vital Signs BP (!) 148/65 (BP Location: Right Arm)   Pulse 63   Temp 98.6 F (37 C) (Oral)   Resp 18   Ht 5' (1.524 m)   Wt 51.3 kg (113 lb)   SpO2 98%   BMI 22.07 kg/m   Physical Exam  Constitutional: She is oriented to person, place, and time. She appears well-developed.  HENT:  Head: Normocephalic.  Mouth/Throat: Oropharynx is  clear and moist.  ? Small posterior scalp hematoma, no laceration   Eyes: Pupils are equal, round, and reactive to light. Conjunctivae and EOM are normal.  Neck: Normal range of motion. Neck supple.  Cardiovascular: Normal rate, regular rhythm and normal heart sounds.   Pulmonary/Chest: Effort normal and breath sounds normal. No respiratory distress. She has no wheezes.  Abdominal: Soft. Bowel sounds are normal. She exhibits no distension. There is no tenderness.  Musculoskeletal:  Mild tenderness L trapezius, dec ROM L shoulder but no deformity. Abrasion R elbow but nl ROM. Abrasion R knee but nl ROM. No obvious spinal tenderness   Neurological: She is alert and oriented to person, place, and time. No cranial nerve deficit. Coordination normal.    Skin: Skin is warm.  Psychiatric: She has a normal mood and affect.  Nursing note and vitals reviewed.    ED Treatments / Results  Labs (all labs ordered are listed, but only abnormal results are displayed) Labs Reviewed  CBG MONITORING, ED - Abnormal; Notable for the following:       Result Value   Glucose-Capillary 201 (*)    All other components within normal limits    EKG  EKG Interpretation None       Radiology Dg Chest 2 View  Result Date: 07/13/2017 CLINICAL DATA:  Fall EXAM: CHEST  2 VIEW COMPARISON:  12/31/2016 FINDINGS: Hyperinflation. No acute consolidation or effusion. Normal cardiomediastinal silhouette with aortic atherosclerosis. No pneumothorax. Degenerative changes of the spine. Surgical clips in the upper abdomen IMPRESSION: No active cardiopulmonary disease. Electronically Signed   By: Jasmine Pang M.D.   On: 07/13/2017 18:43   Dg Pelvis 1-2 Views  Result Date: 07/13/2017 CLINICAL DATA:  Fall EXAM: PELVIS - 1-2 VIEW COMPARISON:  CT 06/09/2017 FINDINGS: SI joints are symmetric. Mild SI joint arthritis. Pubic symphysis and rami appear intact. No fracture or malalignment IMPRESSION: No acute osseous abnormality Electronically Signed   By: Jasmine Pang M.D.   On: 07/13/2017 18:44   Dg Elbow Complete Right  Result Date: 07/13/2017 CLINICAL DATA:  Right elbow pain after fall. EXAM: RIGHT ELBOW - COMPLETE 3+ VIEW COMPARISON:  01/31/2016 right elbow radiographs. FINDINGS: No fracture, joint effusion or dislocation. No significant arthropathy. Tiny enthesophytes at the medial and lateral epicondyles of the right distal humerus. No radiopaque foreign body. IMPRESSION: No right elbow fracture, joint effusion or malalignment. Electronically Signed   By: Delbert Phenix M.D.   On: 07/13/2017 16:49   Ct Head Wo Contrast  Result Date: 07/13/2017 CLINICAL DATA:  Larey Seat today, got caught up in a hose in the garage, fell striking head, no loss of consciousness, history diabetes  mellitus, hypertension EXAM: CT HEAD WITHOUT CONTRAST CT CERVICAL SPINE WITHOUT CONTRAST TECHNIQUE: Multidetector CT imaging of the head and cervical spine was performed following the standard protocol without intravenous contrast. Multiplanar CT image reconstructions of the cervical spine were also generated. COMPARISON:  CT head 02/01/2016, CT cervical spine 07/15/2014 FINDINGS: CT HEAD FINDINGS Brain: Generalized atrophy. Normal ventricular morphology. No midline shift or mass effect. Small vessel chronic ischemic changes of deep cerebral white matter. No intracranial hemorrhage, mass lesion, evidence of acute infarction, or extra-axial fluid collection. Vascular: Atherosclerotic calcification of internal carotid and vertebral arteries at skullbase Skull: Intact Sinuses/Orbits: Clear Other: N/A CT CERVICAL SPINE FINDINGS Alignment: Normal Skull base and vertebrae: Visualized skullbase intact. Diffuse osseous demineralization. Vertebral body more heights maintained. No fracture, subluxation or bone destruction. Scattered mild facet degenerative changes. Soft tissues and spinal  canal: Prevertebral soft tissues normal thickness. Scattered beam hardening artifacts of dental origin. Large mass within RIGHT thyroid lobe again seen 3.7 x 2.5 cm slightly increased. Disc levels: Multilevel disc space narrowing and endplate spur formation. Uncovertebral spurs encroach upon the C5-C6 neural foramina bilaterally greater on LEFT. Upper chest: Lung apices clear Other: Atherosclerotic calcifications at the aortic arch and in the proximal great vessels. IMPRESSION: Atrophy with small vessel chronic ischemic changes of deep cerebral white matter. No acute intracranial abnormalities. Osseous demineralization with degenerative disc and facet disease changes cervical spine. No acute cervical spine abnormalities. Extensive atherosclerotic calcifications as above. Aortic Atherosclerosis (ICD10-I70.0). Electronically Signed   By: Ulyses Southward M.D.   On: 07/13/2017 18:32   Ct Cervical Spine Wo Contrast  Result Date: 07/13/2017 CLINICAL DATA:  Larey Seat today, got caught up in a hose in the garage, fell striking head, no loss of consciousness, history diabetes mellitus, hypertension EXAM: CT HEAD WITHOUT CONTRAST CT CERVICAL SPINE WITHOUT CONTRAST TECHNIQUE: Multidetector CT imaging of the head and cervical spine was performed following the standard protocol without intravenous contrast. Multiplanar CT image reconstructions of the cervical spine were also generated. COMPARISON:  CT head 02/01/2016, CT cervical spine 07/15/2014 FINDINGS: CT HEAD FINDINGS Brain: Generalized atrophy. Normal ventricular morphology. No midline shift or mass effect. Small vessel chronic ischemic changes of deep cerebral white matter. No intracranial hemorrhage, mass lesion, evidence of acute infarction, or extra-axial fluid collection. Vascular: Atherosclerotic calcification of internal carotid and vertebral arteries at skullbase Skull: Intact Sinuses/Orbits: Clear Other: N/A CT CERVICAL SPINE FINDINGS Alignment: Normal Skull base and vertebrae: Visualized skullbase intact. Diffuse osseous demineralization. Vertebral body more heights maintained. No fracture, subluxation or bone destruction. Scattered mild facet degenerative changes. Soft tissues and spinal canal: Prevertebral soft tissues normal thickness. Scattered beam hardening artifacts of dental origin. Large mass within RIGHT thyroid lobe again seen 3.7 x 2.5 cm slightly increased. Disc levels: Multilevel disc space narrowing and endplate spur formation. Uncovertebral spurs encroach upon the C5-C6 neural foramina bilaterally greater on LEFT. Upper chest: Lung apices clear Other: Atherosclerotic calcifications at the aortic arch and in the proximal great vessels. IMPRESSION: Atrophy with small vessel chronic ischemic changes of deep cerebral white matter. No acute intracranial abnormalities. Osseous demineralization  with degenerative disc and facet disease changes cervical spine. No acute cervical spine abnormalities. Extensive atherosclerotic calcifications as above. Aortic Atherosclerosis (ICD10-I70.0). Electronically Signed   By: Ulyses Southward M.D.   On: 07/13/2017 18:32   Dg Shoulder Left  Result Date: 07/13/2017 CLINICAL DATA:  Left shoulder injury EXAM: LEFT SHOULDER - 2+ VIEW COMPARISON:  None. FINDINGS: No acute displaced fracture or malalignment. Glenohumeral and AC joint degenerative change. High-riding humeral head suggesting rotator cuff disease IMPRESSION: 1. No acute fracture or dislocation 2. High-riding humeral head suggesting rotator cuff disease 3. Degenerative changes Electronically Signed   By: Jasmine Pang M.D.   On: 07/13/2017 18:42   Dg Knee Complete 4 Views Right  Result Date: 07/13/2017 CLINICAL DATA:  Fall. EXAM: RIGHT KNEE - COMPLETE 4+ VIEW COMPARISON:  None. FINDINGS: No acute fracture or malalignment. No significant joint effusion. Mild tricompartmental degenerative changes with joint space narrowing and small marginal osteophytes. The bones are diffusely osteopenic. Atherosclerotic vascular calcifications. IMPRESSION: No acute osseous abnormality. Mild tricompartmental degenerative changes. Electronically Signed   By: Obie Dredge M.D.   On: 07/13/2017 16:49    Procedures Procedures (including critical care time)  Medications Ordered in ED Medications  acetaminophen (TYLENOL) tablet 650 mg (650  mg Oral Given 07/13/17 1753)     Initial Impression / Assessment and Plan / ED Course  I have reviewed the triage vital signs and the nursing notes.  Pertinent labs & imaging results that were available during my care of the patient were reviewed by me and considered in my medical decision making (see chart for details).     Kiara Blair is a 83 y.o. female here with fall. Mechanical fall. Will get CT head/neck, xrays.   7:16 PM Xrays showed no fracture. CT head/neck  unremarkable. Given tylenol and felt better. Request Voltaren gel for pain.    Final Clinical Impressions(s) / ED Diagnoses   Final diagnoses:  None    New Prescriptions New Prescriptions   No medications on file     Charlynne Pander, MD 07/13/17 1916

## 2018-03-29 DIAGNOSIS — H2512 Age-related nuclear cataract, left eye: Secondary | ICD-10-CM | POA: Insufficient documentation

## 2018-04-26 ENCOUNTER — Emergency Department (HOSPITAL_BASED_OUTPATIENT_CLINIC_OR_DEPARTMENT_OTHER): Payer: Medicare Other

## 2018-04-26 ENCOUNTER — Other Ambulatory Visit: Payer: Self-pay

## 2018-04-26 ENCOUNTER — Encounter (HOSPITAL_BASED_OUTPATIENT_CLINIC_OR_DEPARTMENT_OTHER): Payer: Self-pay | Admitting: *Deleted

## 2018-04-26 ENCOUNTER — Emergency Department (HOSPITAL_BASED_OUTPATIENT_CLINIC_OR_DEPARTMENT_OTHER)
Admission: EM | Admit: 2018-04-26 | Discharge: 2018-04-26 | Disposition: A | Discharge status: Home / Self Care | Payer: Medicare Other | Attending: Emergency Medicine | Admitting: Emergency Medicine

## 2018-04-26 DIAGNOSIS — E119 Type 2 diabetes mellitus without complications: Secondary | ICD-10-CM | POA: Insufficient documentation

## 2018-04-26 DIAGNOSIS — Y929 Unspecified place or not applicable: Secondary | ICD-10-CM | POA: Insufficient documentation

## 2018-04-26 DIAGNOSIS — Y999 Unspecified external cause status: Secondary | ICD-10-CM | POA: Insufficient documentation

## 2018-04-26 DIAGNOSIS — Y939 Activity, unspecified: Secondary | ICD-10-CM | POA: Diagnosis not present

## 2018-04-26 DIAGNOSIS — Z79899 Other long term (current) drug therapy: Secondary | ICD-10-CM | POA: Diagnosis not present

## 2018-04-26 DIAGNOSIS — W01198A Fall on same level from slipping, tripping and stumbling with subsequent striking against other object, initial encounter: Secondary | ICD-10-CM | POA: Diagnosis not present

## 2018-04-26 DIAGNOSIS — S0000XA Unspecified superficial injury of scalp, initial encounter: Secondary | ICD-10-CM | POA: Diagnosis present

## 2018-04-26 DIAGNOSIS — I10 Essential (primary) hypertension: Secondary | ICD-10-CM | POA: Diagnosis not present

## 2018-04-26 DIAGNOSIS — S0003XA Contusion of scalp, initial encounter: Secondary | ICD-10-CM | POA: Insufficient documentation

## 2018-04-26 DIAGNOSIS — W19XXXA Unspecified fall, initial encounter: Secondary | ICD-10-CM

## 2018-04-26 NOTE — ED Triage Notes (Signed)
Pt c/o fall from standing x 1 hr ago landing on wood floor, hitting head on baseboard and left shoulder

## 2018-04-26 NOTE — ED Provider Notes (Signed)
[facility] EMERGENCY DEPARTMENT Provider Note   CSN: [id number] Arrival date & time: 04/26/18  1222     History   Chief Complaint Chief Complaint  Patient presents with  . Fall    HPI Kiara Blair is a 84 y.o. female with a past medical history, hypertension hypercholesterolemia who presents the emergency department for mechanical fall.  Patient states that she got her foot caught on a step and fell hard against the baseboard of her wall.  The pain in the left shoulder and a little bit of a "funny feeling" in her head.  She denies any headache, changes in vision, loss of consciousness, neck pain, paresthesia of the upper extremities, weakness, nausea, vomiting, changes in vision, unilateral weakness, difficulty with speech or swallowing.  She has a recent cataract surgery.  She denies syncope as the cause.  She denies chest pain or shortness of breath.  Patient is not on any blood thinning medications  HPI  Past Medical History:  Diagnosis Date  . Diabetes mellitus without complication (HCC)   . Hypercholesteremia   . Hypertension     There are no active problems to display for this patient.   Past Surgical History:  Procedure Laterality Date  . ABDOMINAL HYSTERECTOMY    . APPENDECTOMY    . BLADDER SUSPENSION    . CHOLECYSTECTOMY    . TONSILLECTOMY       OB History   None      Home Medications    Prior to Admission medications   Medication Sig Start Date End Date Taking? Authorizing Provider  amLODipine-benazepril (LOTREL) 5-20 MG per capsule Take 1 capsule by mouth daily.    [provider]  benzonatate (TESSALON) 100 MG capsule Take 1 capsule (100 mg total) by mouth every 8 (eight) hours. 12/31/16   Melene Plan, DO  diclofenac sodium (VOLTAREN) 1 % GEL Apply 2 g topically 2 (two) times daily as needed. 07/13/17   Charlynne Pander, MD  glyBURIDE-metformin (GLUCOVANCE) 2.5-500 MG tablet Take 1 tablet by mouth.    [provider]    glyBURIDE-metformin (GLUCOVANCE) 5-500 MG per tablet Take 1 tablet by mouth daily with breakfast.    [provider]  meclizine (ANTIVERT) 25 MG tablet Take 1 tablet (25 mg total) by mouth 3 (three) times daily as needed for dizziness. 02/01/16   Geoffery Lyons, MD  meclizine (ANTIVERT) 25 MG tablet Take 1 tablet (25 mg total) by mouth 3 (three) times daily as needed for dizziness. 01/08/17   Molpus, John, MD  Multiple Vitamin (MULTIVITAMIN WITH MINERALS) TABS tablet Take 1 tablet by mouth daily. Dr. Linton Flemings Vitamins.    [provider]  PROTEIN PO Take 1 each by mouth daily. Premium Protein shake.    [provider]    Family History History reviewed. No pertinent family history.  Social History Social History   Tobacco Use  . Smoking status: Never Smoker  . Smokeless tobacco: Never Used  Substance Use Topics  . Alcohol use: No  . Drug use: No     Allergies   Patient has no known allergies.   Review of Systems Review of Systems Ten systems reviewed and are negative for acute change, except as noted in the HPI.    Physical Exam Updated Vital Signs BP (!) 165/54   Pulse 65   Temp 98.4 F (36.9 C)   Resp 18   Ht 5\' 1"  (1.549 m)   Wt 46.7 kg (103 lb)   SpO2  100%   BMI 19.46 kg/m   Physical Exam  Constitutional: She is oriented to person, place, and time. She appears well-developed and well-nourished. No distress.  HENT:  Head: Normocephalic and atraumatic.  No obvious bruising or hematomas  Eyes: Pupils are equal, round, and reactive to light. Conjunctivae and EOM are normal. No scleral icterus.  Neck: Normal range of motion.  Cardiovascular: Normal rate, regular rhythm, normal heart sounds and intact distal pulses. Exam reveals no gallop and no friction rub.  No murmur heard. Pulmonary/Chest: Effort normal and breath sounds normal. No respiratory distress. She exhibits no tenderness.  Abdominal: Soft. Bowel sounds are normal. She exhibits  no distension and no mass. There is no tenderness. There is no guarding.  Musculoskeletal: Normal range of motion. She exhibits no edema, tenderness or deformity.  Neurological: She is alert and oriented to person, place, and time.  Skin: Skin is warm and dry. She is not diaphoretic.  Psychiatric: Her behavior is normal.  Nursing note and vitals reviewed.    ED Treatments / Results  Labs (all labs ordered are listed, but only abnormal results are displayed) Labs Reviewed - No data to display  EKG None  Radiology Ct Head Wo Contrast  Result Date: 04/26/2018 CLINICAL DATA:  Pain following fall EXAM: CT HEAD WITHOUT CONTRAST CT CERVICAL SPINE WITHOUT CONTRAST TECHNIQUE: Multidetector CT imaging of the head and cervical spine was performed following the standard protocol without intravenous contrast. Multiplanar CT image reconstructions of the cervical spine were also generated. COMPARISON:  CT head and CT cervical spine July 13, 2017 FINDINGS: CT HEAD FINDINGS Brain: Age related volume loss is stable. There is no intracranial mass, hemorrhage, extra-axial fluid collection, or midline shift. There is mild small vessel disease in the centra semiovale bilaterally. Elsewhere gray-white compartments appear normal. No evident acute infarct. Vascular: No hyperdense vessel. There is calcification in each distal vertebral artery and carotid siphon region. Skull: Bony calvarium appears intact. Sinuses/Orbits: There is mucosal thickening in several ethmoid air cells bilaterally with opacification in anterior ethmoid air cells bilaterally. Other visualized paranasal sinuses are clear. Orbits appear symmetric bilaterally. Other: Mastoid air cells are clear. CT CERVICAL SPINE FINDINGS Alignment: There is 2 mm of anterolisthesis of C7 on T1. No other spondylolisthesis evident. Skull base and vertebrae: Skull base and craniocervical junction regions appear normal. There is mild pannus posterior to the odontoid  which is not causing appreciable impression on the craniocervical junction. No fracture is demonstrable. There are no blastic or lytic bone lesions. Soft tissues and spinal canal: Prevertebral soft tissues and predental space regions are normal. No paraspinous lesion is evident. There is no cord or canal hematoma. Disc levels: There is moderate disc space narrowing at C4-5, C5-6, and C7-T1. There is multilevel facet hypertrophy. There is exit foraminal narrowing at C4-5, C5-6, and C6-7 bilaterally were effacement of exiting nerve roots at C5-6 bilaterally due to bony hypertrophy. There is no frank disc extrusion or high-grade stenosis. Upper chest: Visualized upper lung regions are clear. Other: There are foci of carotid artery calcification bilaterally. There is again noted a dominant mass in the right lobe of the thyroid measuring 2.8 x 2.3 cm which has been noted previously and by report has undergone previous fine-needle aspiration in 2016. IMPRESSION: CT head: Stable age related volume loss with mild patchy periventricular small vessel disease. No mass or hemorrhage. No acute infarct. There are foci of arterial vascular calcification. There is ethmoid sinus disease. CT cervical spine: No fracture. Slight  spondylolisthesis at C7-T1 is felt to be due to underlying spondylosis. Multiple foci of osteoarthritic change noted facet hypertrophy at multiple levels. Effacement of exiting nerve roots at C5-6 bilaterally due to bony hypertrophy. There are foci carotid artery calcification bilaterally. Dominant right lobe thyroid mass by report has undergone previous fine-needle aspiration in 2016. Mass appears similar to prior study. Electronically Signed   By: Bretta Bang III M.D.   On: 04/26/2018 13:54   Ct Cervical Spine Wo Contrast  Result Date: 04/26/2018 CLINICAL DATA:  Pain following fall EXAM: CT HEAD WITHOUT CONTRAST CT CERVICAL SPINE WITHOUT CONTRAST TECHNIQUE: Multidetector CT imaging of the head and  cervical spine was performed following the standard protocol without intravenous contrast. Multiplanar CT image reconstructions of the cervical spine were also generated. COMPARISON:  CT head and CT cervical spine July 13, 2017 FINDINGS: CT HEAD FINDINGS Brain: Age related volume loss is stable. There is no intracranial mass, hemorrhage, extra-axial fluid collection, or midline shift. There is mild small vessel disease in the centra semiovale bilaterally. Elsewhere gray-white compartments appear normal. No evident acute infarct. Vascular: No hyperdense vessel. There is calcification in each distal vertebral artery and carotid siphon region. Skull: Bony calvarium appears intact. Sinuses/Orbits: There is mucosal thickening in several ethmoid air cells bilaterally with opacification in anterior ethmoid air cells bilaterally. Other visualized paranasal sinuses are clear. Orbits appear symmetric bilaterally. Other: Mastoid air cells are clear. CT CERVICAL SPINE FINDINGS Alignment: There is 2 mm of anterolisthesis of C7 on T1. No other spondylolisthesis evident. Skull base and vertebrae: Skull base and craniocervical junction regions appear normal. There is mild pannus posterior to the odontoid which is not causing appreciable impression on the craniocervical junction. No fracture is demonstrable. There are no blastic or lytic bone lesions. Soft tissues and spinal canal: Prevertebral soft tissues and predental space regions are normal. No paraspinous lesion is evident. There is no cord or canal hematoma. Disc levels: There is moderate disc space narrowing at C4-5, C5-6, and C7-T1. There is multilevel facet hypertrophy. There is exit foraminal narrowing at C4-5, C5-6, and C6-7 bilaterally were effacement of exiting nerve roots at C5-6 bilaterally due to bony hypertrophy. There is no frank disc extrusion or high-grade stenosis. Upper chest: Visualized upper lung regions are clear. Other: There are foci of carotid artery  calcification bilaterally. There is again noted a dominant mass in the right lobe of the thyroid measuring 2.8 x 2.3 cm which has been noted previously and by report has undergone previous fine-needle aspiration in 2016. IMPRESSION: CT head: Stable age related volume loss with mild patchy periventricular small vessel disease. No mass or hemorrhage. No acute infarct. There are foci of arterial vascular calcification. There is ethmoid sinus disease. CT cervical spine: No fracture. Slight spondylolisthesis at C7-T1 is felt to be due to underlying spondylosis. Multiple foci of osteoarthritic change noted facet hypertrophy at multiple levels. Effacement of exiting nerve roots at C5-6 bilaterally due to bony hypertrophy. There are foci carotid artery calcification bilaterally. Dominant right lobe thyroid mass by report has undergone previous fine-needle aspiration in 2016. Mass appears similar to prior study. Electronically Signed   By: Bretta Bang III M.D.   On: 04/26/2018 13:54   Dg Shoulder Left  Result Date: 04/26/2018 CLINICAL DATA: Fall. EXAM: LEFT SHOULDER - 2+ VIEW COMPARISON:  Left shoulder series. FINDINGS: Acromioclavicular and glenohumeral degenerative change. Subacromial spurring. High-riding left shoulder consistent chronic rotator cuff tear. No acute bony abnormality identified. No evidence of fracture, dislocation, or  separation. IMPRESSION: 1. Acromioclavicular and glenohumeral degenerative change. Subacromial spurring. High-riding left shoulder consistent chronic rotator cuff tear. 2. No acute bony or joint abnormality. No evidence of fracture or dislocation. Electronically Signed   By: Maisie Fus  Register   On: 04/26/2018 13:56    Procedures Procedures (including critical care time)  Medications Ordered in ED Medications - No data to display   Initial Impression / Assessment and Plan / ED Course  I have reviewed the triage vital signs and the nursing notes.  Pertinent labs & imaging  results that were available during my care of the patient were reviewed by me and considered in my medical decision making (see chart for details).     Patient imaging negative for any acute abnormality.  Patient appears appropriate for discharge at this time.  Seen and shared visit with Dr. Anitra Lauth who agrees with assessment and plan for discharge  Final Clinical Impressions(s) / ED Diagnoses   Final diagnoses:  None    ED Discharge Orders    None       Arthor Captain, PA-C 04/26/18 1949    Gwyneth Sprout, MD 04/27/18 [phone]

## 2018-08-27 ENCOUNTER — Encounter (HOSPITAL_BASED_OUTPATIENT_CLINIC_OR_DEPARTMENT_OTHER): Payer: Self-pay | Admitting: *Deleted

## 2018-08-27 ENCOUNTER — Emergency Department (HOSPITAL_BASED_OUTPATIENT_CLINIC_OR_DEPARTMENT_OTHER): Payer: Medicare Other

## 2018-08-27 ENCOUNTER — Emergency Department (HOSPITAL_BASED_OUTPATIENT_CLINIC_OR_DEPARTMENT_OTHER)
Admission: EM | Admit: 2018-08-27 | Discharge: 2018-08-27 | Disposition: A | Discharge status: Home / Self Care | Payer: Medicare Other | Attending: Emergency Medicine | Admitting: Emergency Medicine

## 2018-08-27 ENCOUNTER — Other Ambulatory Visit: Payer: Self-pay

## 2018-08-27 DIAGNOSIS — Y9301 Activity, walking, marching and hiking: Secondary | ICD-10-CM | POA: Diagnosis not present

## 2018-08-27 DIAGNOSIS — Y999 Unspecified external cause status: Secondary | ICD-10-CM | POA: Insufficient documentation

## 2018-08-27 DIAGNOSIS — M25562 Pain in left knee: Secondary | ICD-10-CM

## 2018-08-27 DIAGNOSIS — E119 Type 2 diabetes mellitus without complications: Secondary | ICD-10-CM | POA: Diagnosis not present

## 2018-08-27 DIAGNOSIS — M25561 Pain in right knee: Secondary | ICD-10-CM | POA: Insufficient documentation

## 2018-08-27 DIAGNOSIS — W0110XA Fall on same level from slipping, tripping and stumbling with subsequent striking against unspecified object, initial encounter: Secondary | ICD-10-CM | POA: Diagnosis not present

## 2018-08-27 DIAGNOSIS — S299XXA Unspecified injury of thorax, initial encounter: Secondary | ICD-10-CM | POA: Diagnosis present

## 2018-08-27 DIAGNOSIS — Y92000 Kitchen of unspecified non-institutional (private) residence as  the place of occurrence of the external cause: Secondary | ICD-10-CM | POA: Insufficient documentation

## 2018-08-27 DIAGNOSIS — S20212A Contusion of left front wall of thorax, initial encounter: Secondary | ICD-10-CM

## 2018-08-27 DIAGNOSIS — W19XXXA Unspecified fall, initial encounter: Secondary | ICD-10-CM

## 2018-08-27 DIAGNOSIS — R51 Headache: Secondary | ICD-10-CM | POA: Insufficient documentation

## 2018-08-27 DIAGNOSIS — Y92009 Unspecified place in unspecified non-institutional (private) residence as the place of occurrence of the external cause: Secondary | ICD-10-CM

## 2018-08-27 DIAGNOSIS — S0990XA Unspecified injury of head, initial encounter: Secondary | ICD-10-CM

## 2018-08-27 DIAGNOSIS — I10 Essential (primary) hypertension: Secondary | ICD-10-CM | POA: Insufficient documentation

## 2018-08-27 MED ORDER — LIDOCAINE 5 % EX PTCH: 1.0000 | MEDICATED_PATCH | CUTANEOUS | 0 refills | Status: DC

## 2018-08-27 NOTE — Discharge Instructions (Signed)
We believe that your symptoms are caused by musculoskeletal strain.  Please read through the included information about additional care such as heating pads, over-the-counter pain medicine.  If you were provided a prescription please use it only as needed and as instructed.  Remember that early mobility and using the affected part of your body is actually better than keeping it immobile.  Use the incentive spirometer at least once every 1-2 hours while awake. Return to the ED with worsening pain, fever, chills, or coughing.   Follow-up with the doctor listed as recommended or return to the emergency department with new or worsening symptoms that concern you.

## 2018-08-27 NOTE — ED Provider Notes (Signed)
Emergency Department Provider Note   I have reviewed the triage vital signs and the nursing notes.   HISTORY  Chief Complaint Fall   HPI Kiara Blair is a 84 y.o. female with PMH of DM, HLD, and HTN presents to the emergency department for evaluation after mechanical fall last night.  The patient states that she turned to suddenly walked into her kitchen.  There is a single step up into the kitchen which she caught with her right foot and fell forward.  She was holding a book in both hands when she fell so did not have a FOOSH.  She did strike her forehead on the book she fell but did not experience a loss of consciousness.  She landed on her knees and has been having left lateral chest wall pain since the fall yesterday.  The patient was eventually able to get up under her own power.  She applied ice through the evening but is had continued pain in multiple areas.  No fevers, chills, shortness of breath.  She did have a fall in the last month which also resulted in head injury.  No vision changes.   Past Medical History:  Diagnosis Date  . Diabetes mellitus without complication (HCC)   . Hypercholesteremia   . Hypertension     There are no active problems to display for this patient.   Past Surgical History:  Procedure Laterality Date  . ABDOMINAL HYSTERECTOMY    . APPENDECTOMY    . BLADDER SUSPENSION    . CHOLECYSTECTOMY    . TONSILLECTOMY     Allergies Patient has no allergy information on record.  No family history on file.  Social History Social History   Tobacco Use  . Smoking status: Never Smoker  . Smokeless tobacco: Never Used  Substance Use Topics  . Alcohol use: No  . Drug use: No    Review of Systems  Constitutional: No fever/chills Eyes: No visual changes. ENT: No sore throat. Cardiovascular: Denies chest pain. Respiratory: Denies shortness of breath. Gastrointestinal: No abdominal pain.  No nausea, no vomiting.  No diarrhea.  No  constipation. Genitourinary: Negative for dysuria. Musculoskeletal: Negative for back pain. Positive left chest wall pain and bilateral knee pain.  Skin: Negative for rash. Neurological: Negative for focal weakness or numbness. Positive HA.   10-point ROS otherwise negative.  ____________________________________________   PHYSICAL EXAM:  VITAL SIGNS: ED Triage Vitals  Enc Vitals Group     BP 08/27/18 1721 (!) 153/64     Pulse Rate 08/27/18 1721 77     Resp 08/27/18 1721 20     Temp 08/27/18 1721 98.4 F (36.9 C)     Temp Source 08/27/18 1721 Oral     SpO2 08/27/18 1721 98 %     Weight 08/27/18 1718 106 lb (48.1 kg)     Height 08/27/18 1718 5\' 1"  (1.549 m)     Pain Score 08/27/18 1717 0   Constitutional: Alert and oriented. Well appearing and in no acute distress. Eyes: Conjunctivae are normal. PERRL.  Head: Atraumatic. Mild tenderness over the forehead. No hematoma or laceration.  Nose: No congestion/rhinnorhea. Mouth/Throat: Mucous membranes are moist.  Neck: No stridor. No cervical spine tenderness to palpation. Cardiovascular: Normal rate, regular rhythm. Good peripheral circulation. Grossly normal heart sounds.   Respiratory: Normal respiratory effort.  No retractions. Lungs CTAB. Gastrointestinal: Soft and nontender. No distention.  Musculoskeletal: Bilateral knee tenderness without effusion, bruising, or laceration. Normal ROM of bilateral hips. Tenderness to  palpation of the left lateral chest wall. No bruising or crepitus.  Neurologic:  Normal speech and language. No gross focal neurologic deficits are appreciated.  Skin:  Skin is warm, dry and intact. No rash noted.  ____________________________________________  RADIOLOGY  Dg Ribs Unilateral W/chest Left  Result Date: 08/27/2018 CLINICAL DATA:  Patient fell yesterday hitting forehead and left anterior chest wall. EXAM: LEFT RIBS AND CHEST - 3+ VIEW COMPARISON:  CXR 07/13/2017 FINDINGS: No fracture or other bone  lesions are seen involving the ribs. There is no evidence of pneumothorax or pleural effusion. Both lungs are clear. Heart size and mediastinal contours are within normal limits. Atherosclerosis of the aortic arch without aneurysm. 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 Thoracic spondylosis is. IMPRESSION: 1. No acute displaced rib fracture. Should symptoms persist without improvement, repeat imaging in 7-10 days may help reveal a radiographically occult fracture. 2. No active pulmonary disease. 3. Aortic atherosclerosis. Electronically Signed   By: Tollie Eth M.D.   On: 08/27/2018 18:36   Ct Head Wo Contrast  Result Date: 08/27/2018 CLINICAL DATA:  Patient fell at home last evening hitting head. EXAM: CT HEAD WITHOUT CONTRAST TECHNIQUE: Contiguous axial images were obtained from the base of the skull through the vertex without intravenous contrast. COMPARISON:  04/26/2018 FINDINGS: Brain: Chronic stable involutional changes of brain with small vessel ischemic disease. No acute intracranial hemorrhage, midline shift or edema. No hydrocephalus. Basal cisterns are patent. Midline fourth ventricle. Cerebellum brainstem are intact. No large vascular territory infarct. No extra-axial fluid collections. Vascular: Atherosclerosis of carotid siphons and both vertebral arteries. No hyperdense vessel sign. Skull: Intact Sinuses/Orbits: No acute finding.  Bilateral lens replacements. Other: No significant calvarial soft tissue swelling. IMPRESSION: Chronic stable involutional changes of the brain with chronic small vessel ischemic disease. No acute intracranial abnormality. Electronically Signed   By: Tollie Eth M.D.   On: 08/27/2018 18:11   Dg Knee Complete 4 Views Left  Result Date: 08/27/2018 CLINICAL DATA:  Pain after fall yesterday. EXAM: LEFT KNEE - COMPLETE 4+ VIEW COMPARISON:  None. FINDINGS: Mild femorotibial and patellofemoral joint space narrowing without fracture or  joint effusion. Enthesopathy off the upper pole of patella. Popliteal arteriosclerosis is noted. No soft tissue mass nor mineralization. No significant soft tissue swelling. IMPRESSION: Mild degenerative joint space narrowing of the femorotibial and patellofemoral compartments. No acute osseous abnormality. Electronically Signed   By: Tollie Eth M.D.   On: 08/27/2018 18:37   Dg Knee Complete 4 Views Right  Result Date: 08/27/2018 CLINICAL DATA:  Pain after fall EXAM: RIGHT KNEE - COMPLETE 4+ VIEW COMPARISON:  07/13/2017 FINDINGS: Moderate femorotibial and patellofemoral joint space narrowing with trace suprapatellar joint effusion. No fracture, intra-articular loose body nor bone destruction. No joint dislocation. Soft tissues are unremarkable apart from minimal calcifications along the course of the distal patellar tendon seen on lateral view. IMPRESSION: Moderate femorotibial and patellofemoral joint space narrowing with trace suprapatellar joint effusion. No acute osseous abnormality. Suspect mild patellar calcific tendinopathy versus overlying soft tissue debris. Electronically Signed   By: Tollie Eth M.D.   On: 08/27/2018 18:38    ____________________________________________   PROCEDURES  Procedure(s) performed:   Procedures  None ____________________________________________   INITIAL IMPRESSION / ASSESSMENT AND PLAN / ED COURSE  Pertinent labs & imaging results that were available during my care of the patient were reviewed by me and considered in my medical decision making (see chart for details).  Patient presents to the emergency department for  evaluation after mechanical fall.  She is having pain in the left lateral chest wall without bruising, crepitus, or obvious deformity.  She is also having bilateral knee pain.  Patient struck her head while falling.  She is not anticoagulated.  Plan for CT imaging of the head with multiple recent falls resulting in head injury along with  plain films of the bilateral knees and chest with lateral ribs.   Imaging reviewed. No acute findings. Patient feeling well. Incentive spirometer given and lidoderm patch prescribed.   At this time, I do not feel there is any life-threatening condition present. I have reviewed and discussed all results (EKG, imaging, lab, urine as appropriate), exam findings with patient. I have reviewed nursing notes and appropriate previous records.  I feel the patient is safe to be discharged home without further emergent workup. Discussed usual and customary return precautions. Patient and family (if present) verbalize understanding and are comfortable with this plan.  Patient will follow-up with their primary care provider. If they do not have a primary care provider, information for follow-up has been provided to them. All questions have been answered.  ____________________________________________  FINAL CLINICAL IMPRESSION(S) / ED DIAGNOSES  Final diagnoses:  Injury of head, initial encounter  Contusion of left chest wall, initial encounter  Acute pain of both knees  Fall in home, initial encounter    NEW OUTPATIENT MEDICATIONS STARTED DURING THIS VISIT:  Discharge Medication List as of 08/27/2018  7:02 PM    START taking these medications   Details  lidocaine (LIDODERM) 5 % Place 1 patch onto the skin daily. Remove & Discard patch within 12 hours or as directed by MD, Starting Tue 08/27/2018, Normal        Note:  This document was prepared using Dragon voice recognition software and may include unintentional dictation errors.  Alona BeneJoshua Wiley Magan, MD Emergency Medicine    Jermiah Soderman, Arlyss RepressJoshua G, MD 08/28/18 [phone]

## 2018-08-27 NOTE — ED Triage Notes (Signed)
She turned and fell last night. Injury to her right knee and left ribs. She hit her forehead but does not have an injury. She is alert and oriented.

## 2018-08-27 NOTE — ED Notes (Signed)
Patient transported to X-ray 

## 2018-08-30 IMAGING — DX DG CHEST 2V
2 series · 2 of 2 positions shown · non-contrast
Comparison: 12/31/2016

CLINICAL DATA: Fall

EXAM:
CHEST  2 VIEW

[chest pa]
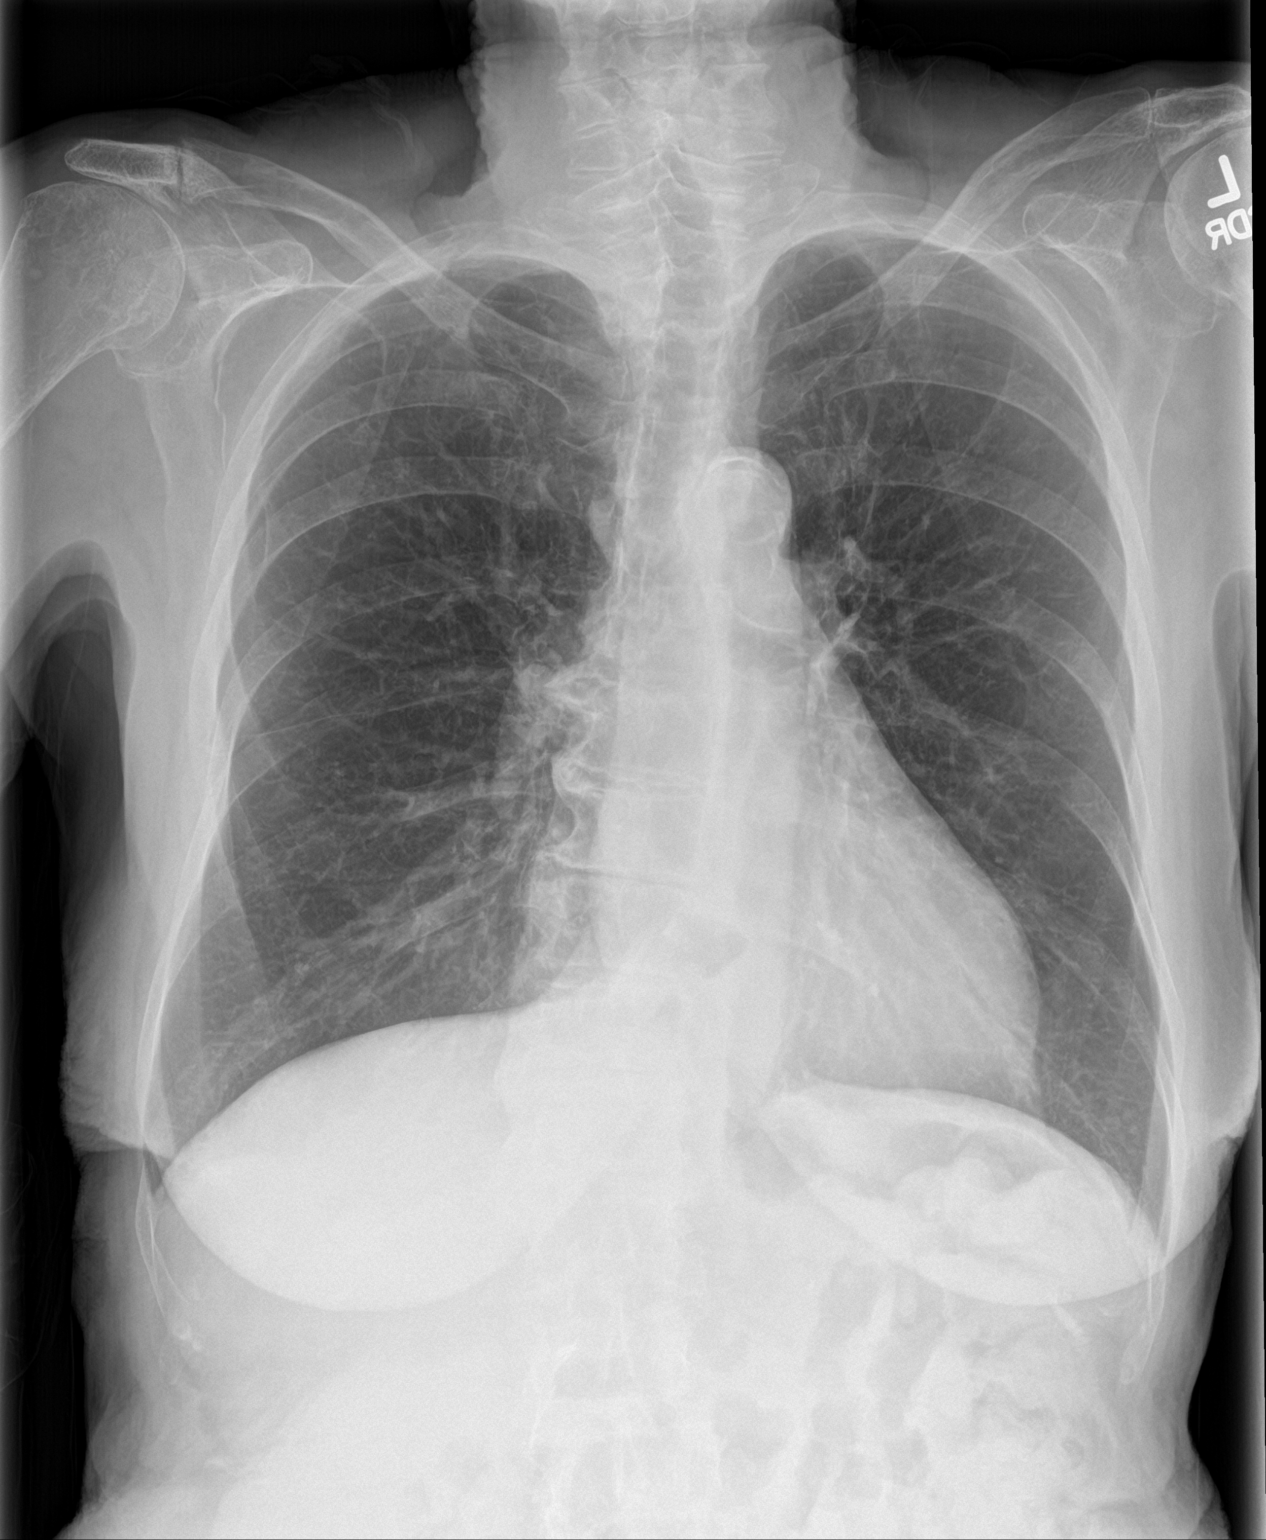

[chest lat]
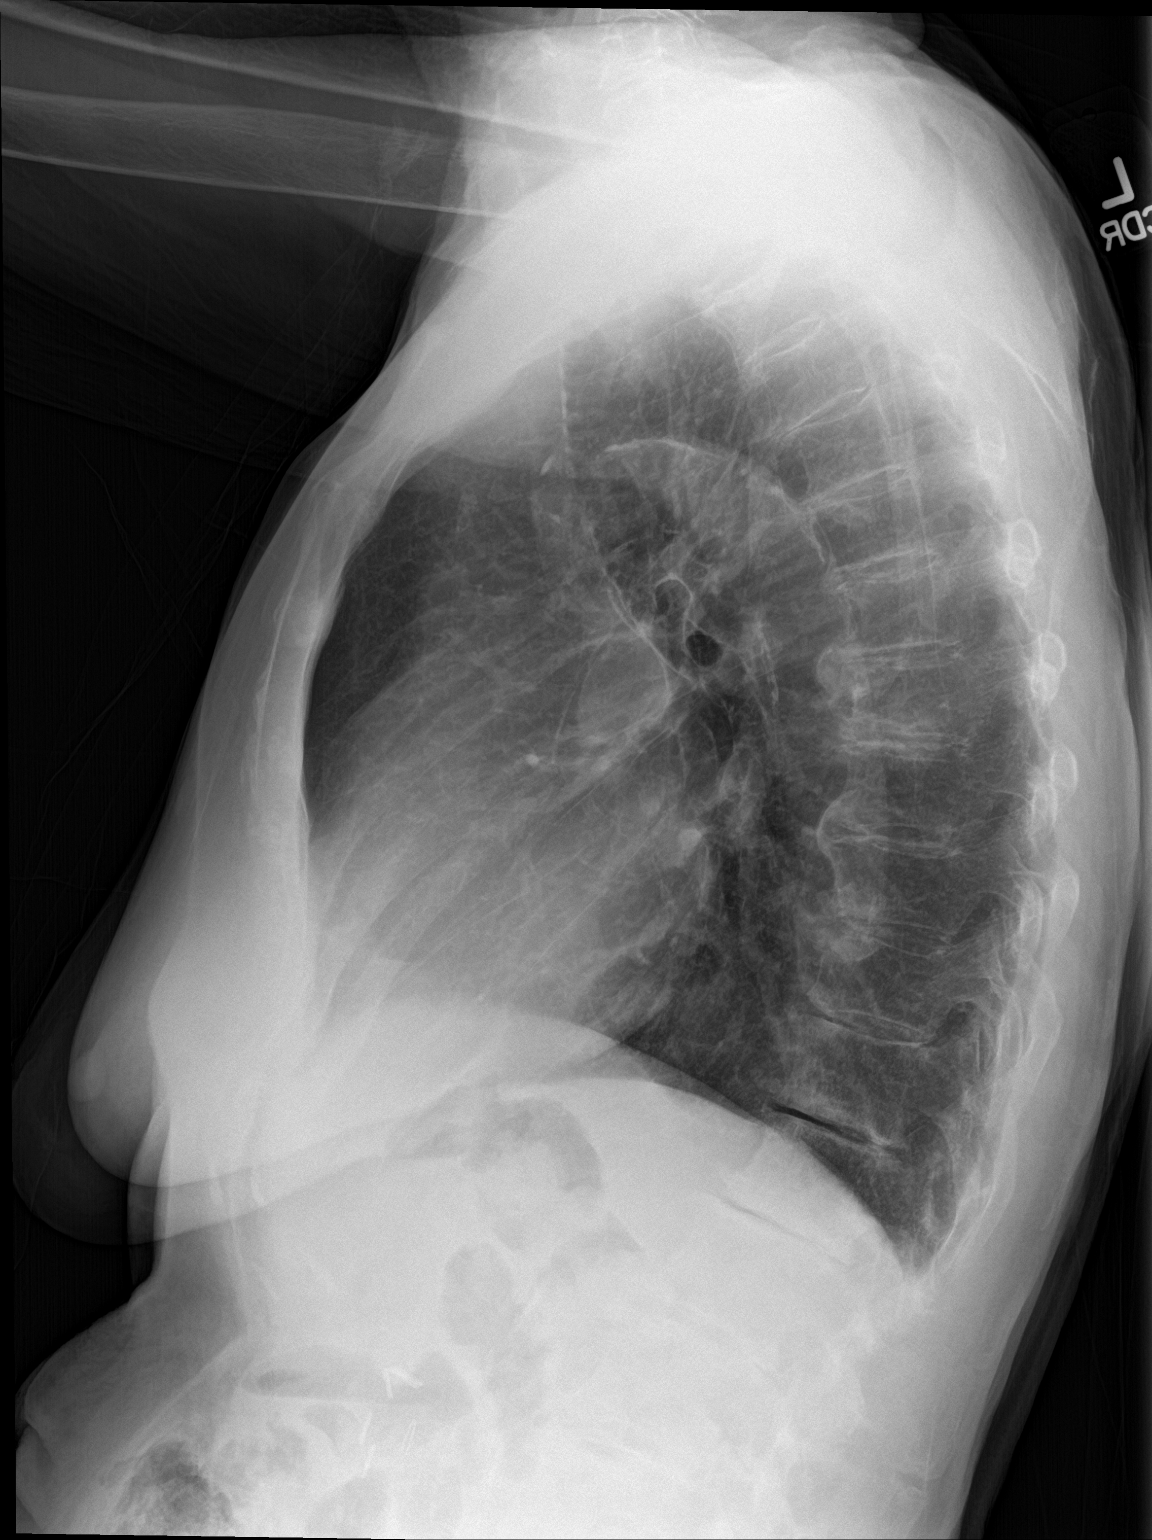

[2 of 2 positions shown; findings below may reference images not displayed]

FINDINGS: Hyperinflation. No acute consolidation or effusion. Normal
cardiomediastinal silhouette with aortic atherosclerosis. No
pneumothorax. Degenerative changes of the spine. Surgical clips in
the upper abdomen
IMPRESSION: No active cardiopulmonary disease.

## 2018-08-30 IMAGING — DX DG ELBOW COMPLETE 3+V*R*
4 series · 4 of 4 positions shown · non-contrast
Comparison: 01/31/2016 right elbow radiographs.

CLINICAL DATA: Right elbow pain after fall.

EXAM:
RIGHT ELBOW - COMPLETE 3+ VIEW

[elbow ap]
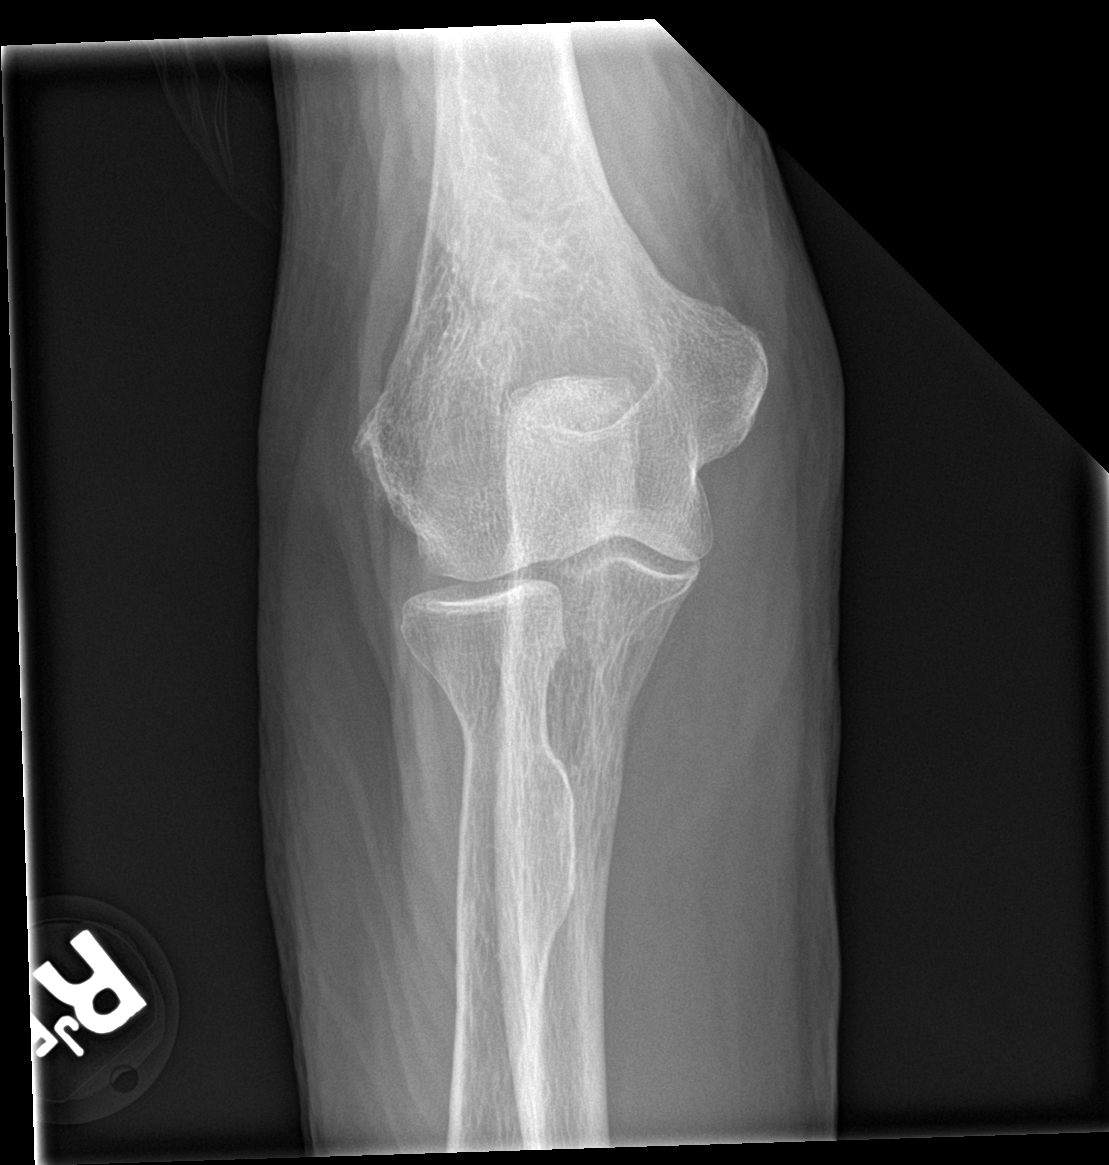

[elbow obl (1 of 2)]
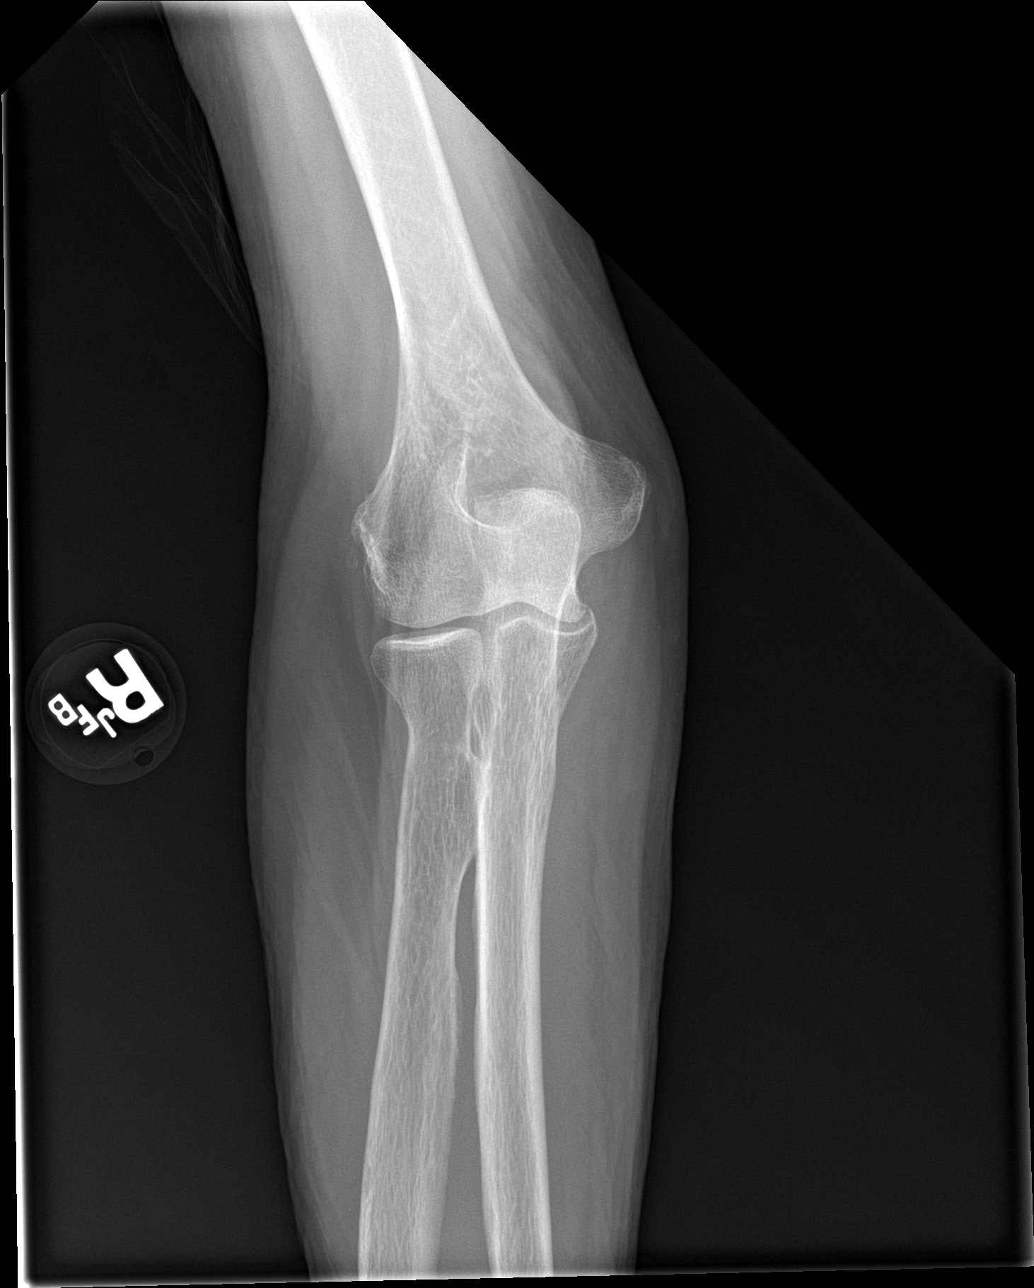

[elbow obl (2 of 2)]
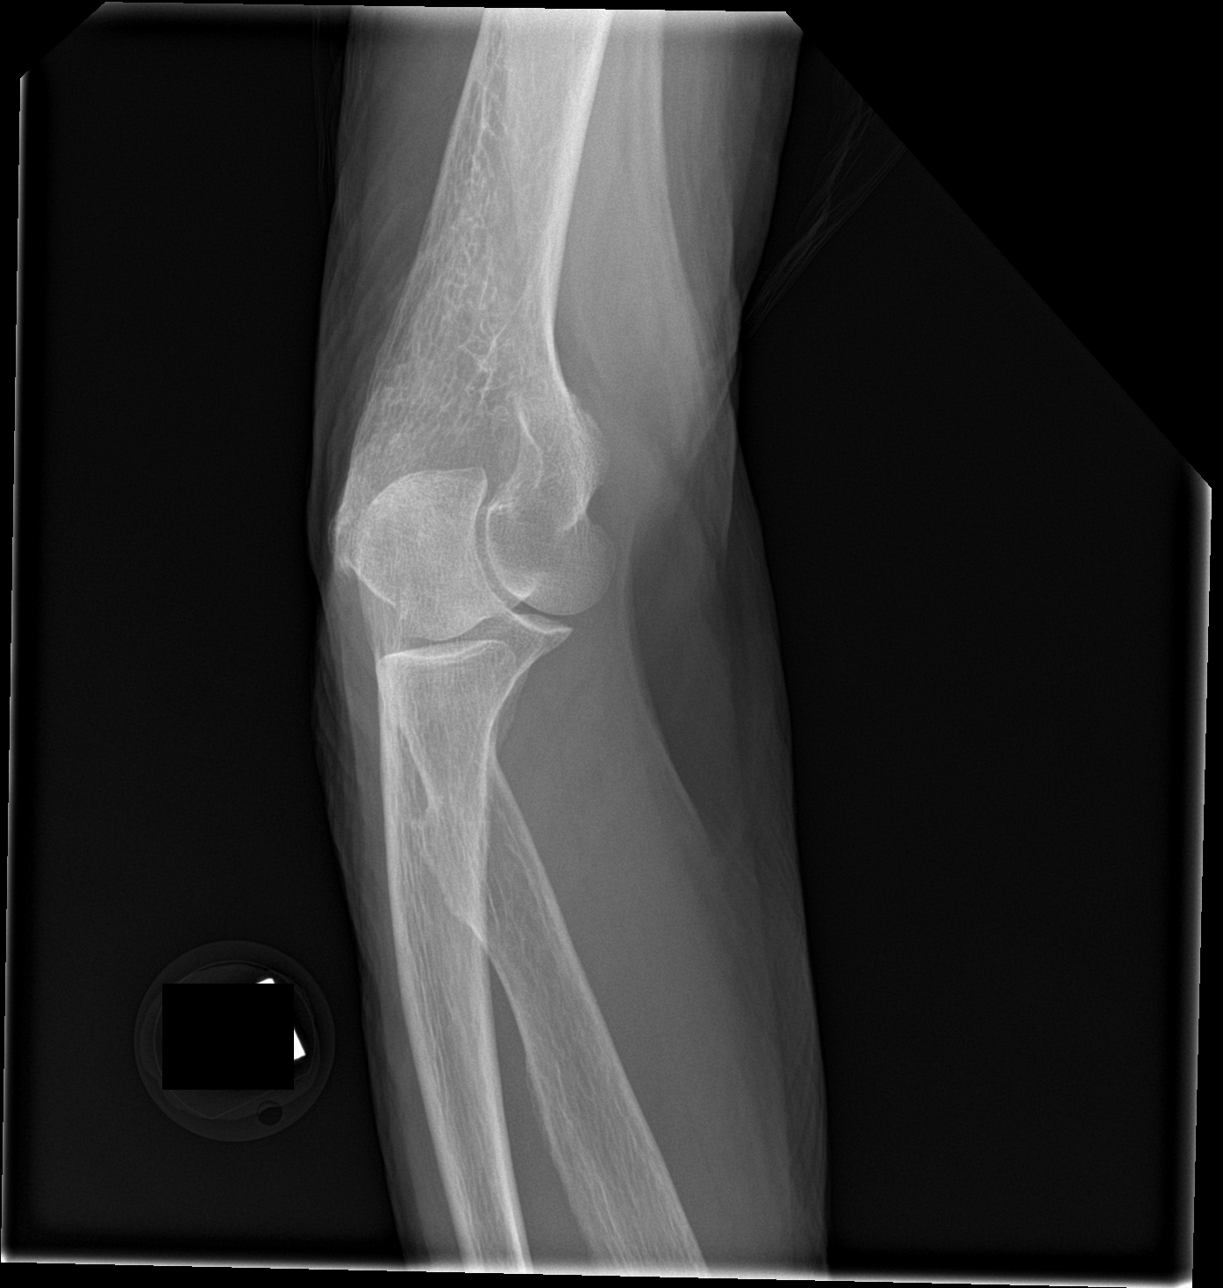

[elbow lat]
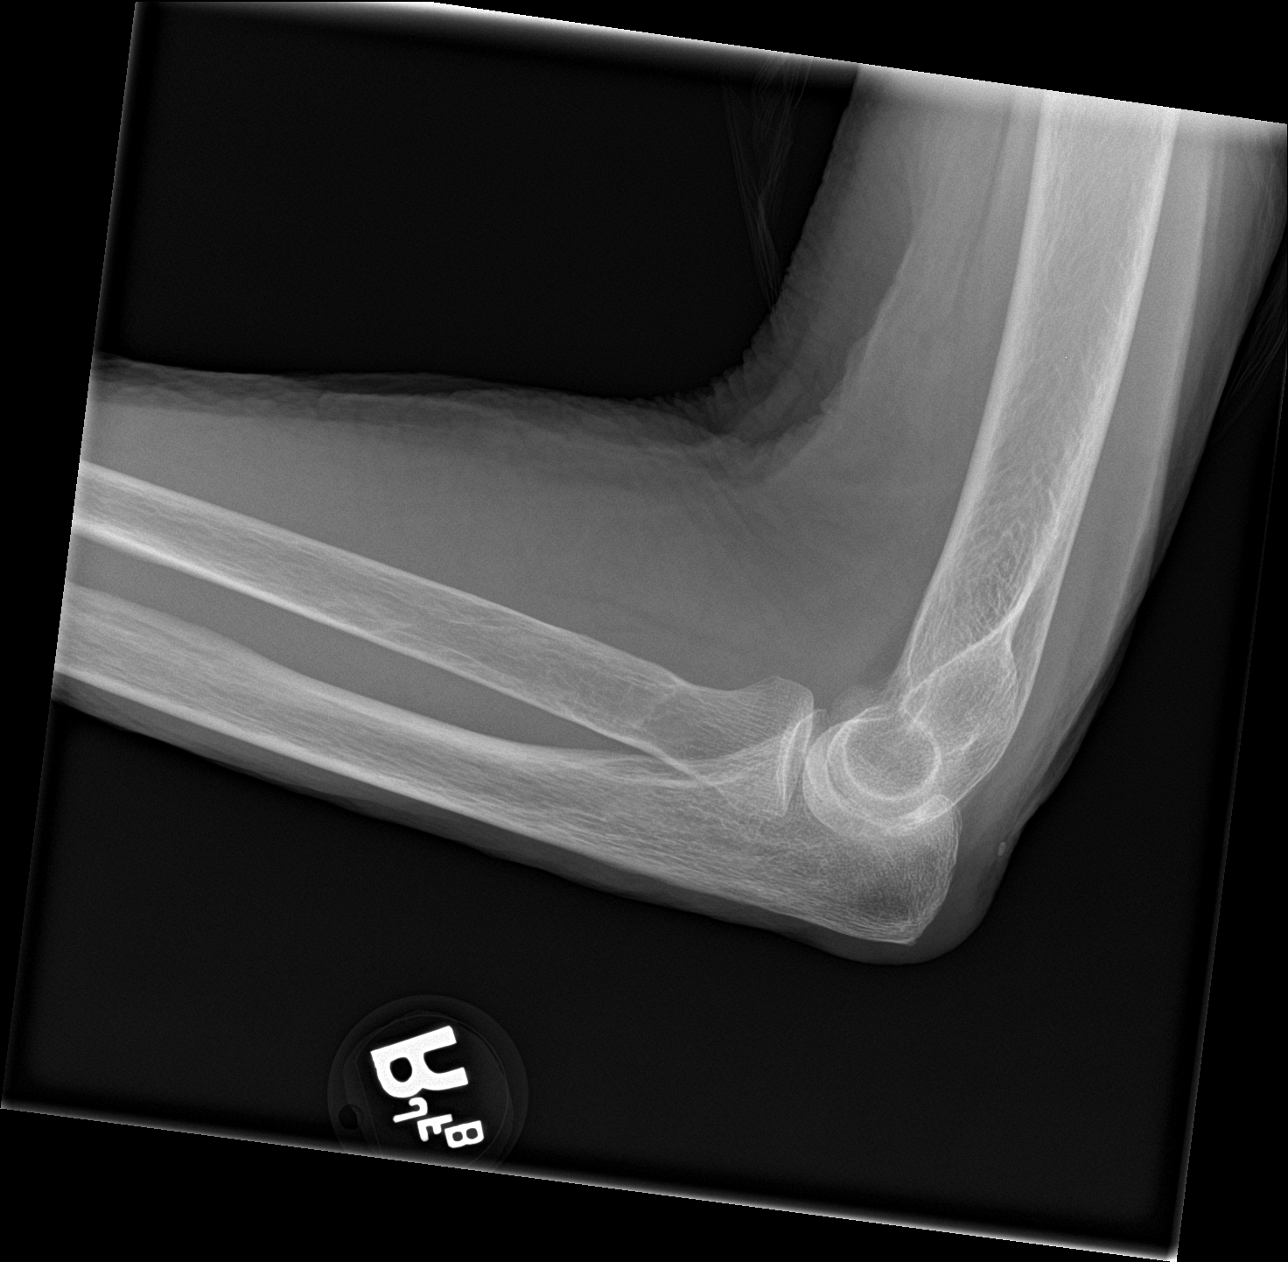

[4 of 4 positions shown; findings below may reference images not displayed]

FINDINGS: No fracture, joint effusion or dislocation. No significant
arthropathy. Tiny enthesophytes at the medial and lateral
epicondyles of the right distal humerus. No radiopaque foreign body.
IMPRESSION: No right elbow fracture, joint effusion or malalignment.

## 2018-08-30 IMAGING — CT CT CERVICAL SPINE W/O CM
4 of 7 series · 15 of 33 positions shown, 16 images · non-contrast
Comparison: CT head 02/01/2016, CT cervical spine 07/15/2014

ADDENDUM:
Omitted from the impression of the report is presence of a RIGHT
thyroid mass slightly increased in size since 2348. This lesion has
been previously assessed by sonography and underwent
ultrasound-guided FNA on 09/22/2015; recommend correlation with
prior cytology.
CLINICAL DATA: Fell today, got caught up in a hose in the garage,
fell striking head, no loss of consciousness, history diabetes
mellitus, hypertension

EXAM:
CT HEAD WITHOUT CONTRAST
CT CERVICAL SPINE WITHOUT CONTRAST
TECHNIQUE: Multidetector CT imaging of the head and cervical spine was
performed following the standard protocol without intravenous
contrast. Multiplanar CT image reconstructions of the cervical spine
were also generated.

[Series 4: head 3.0 mpr cor · coronal · 0.31mm/px · 3 of 62 slices shown]
[im 16/62  bone]
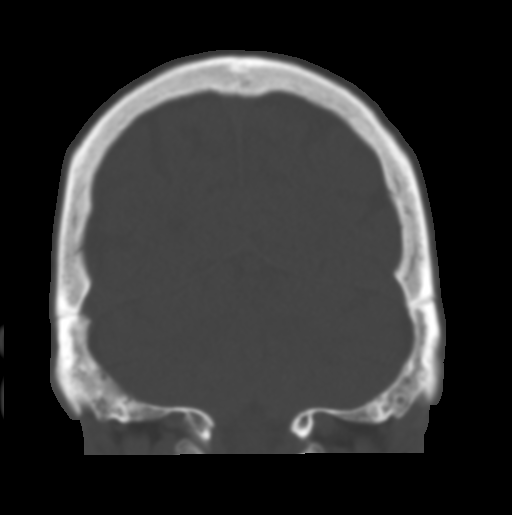
[im 31/62  bone]
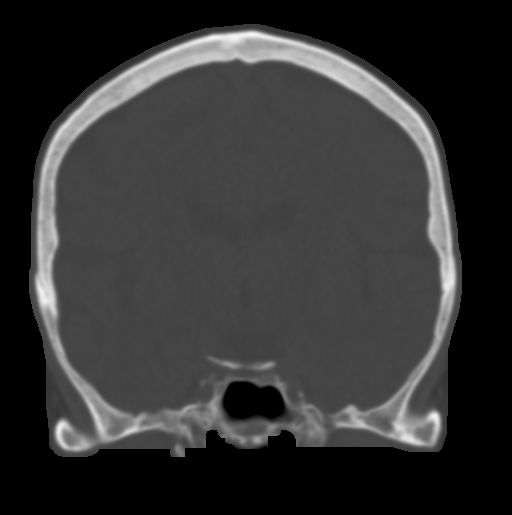
[im 46/62  bone]
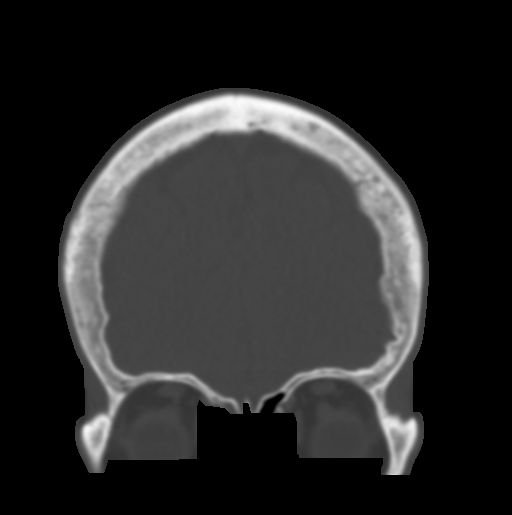

[Series 7: c_spine 2.0 i30s 3 · axial · 0.52mm/px · z∈[-287,-179]mm · 4 of 90 slices shown, 5 images]
[im 18/90  soft-tissue]
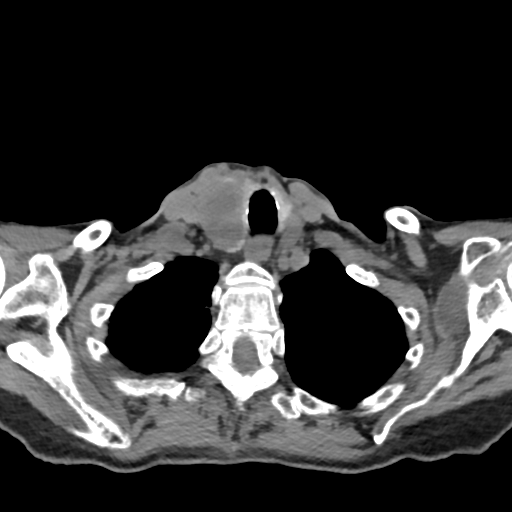
[im 18/90  bone]
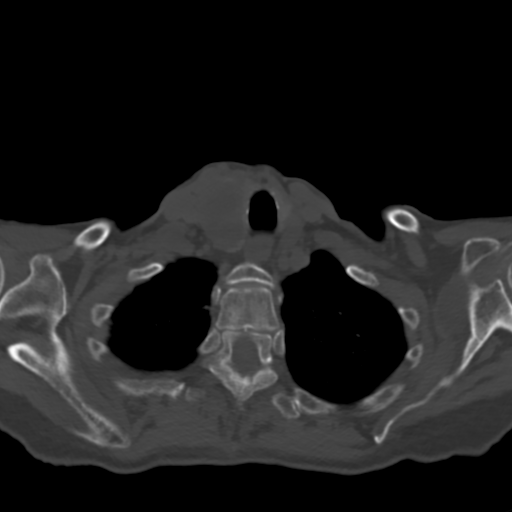
[im 36/90  bone]
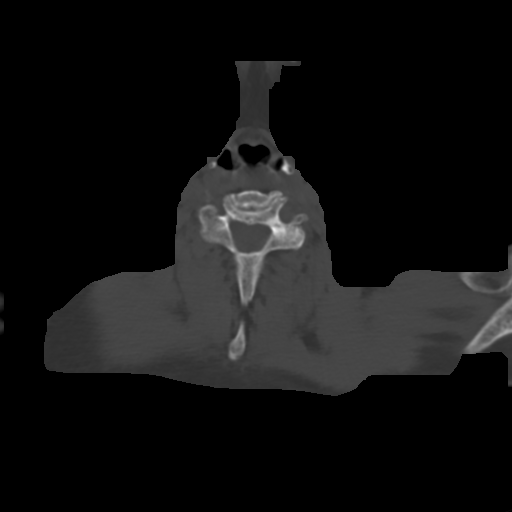
[im 54/90  bone]
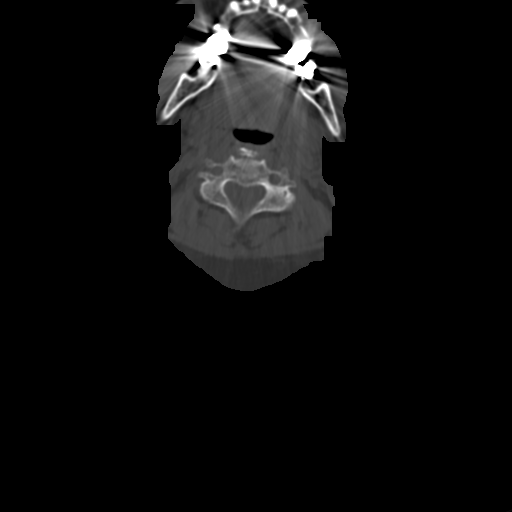
[im 72/90  bone]
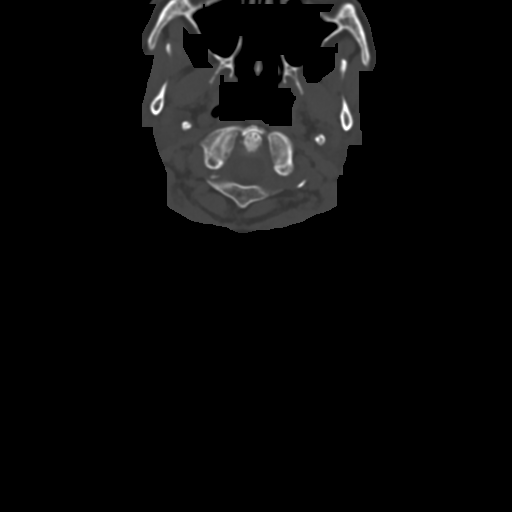

[Series 10: sagittals · sagittal · 0.35mm/px · 5 of 63 slices shown]
[im 11/63  bone]
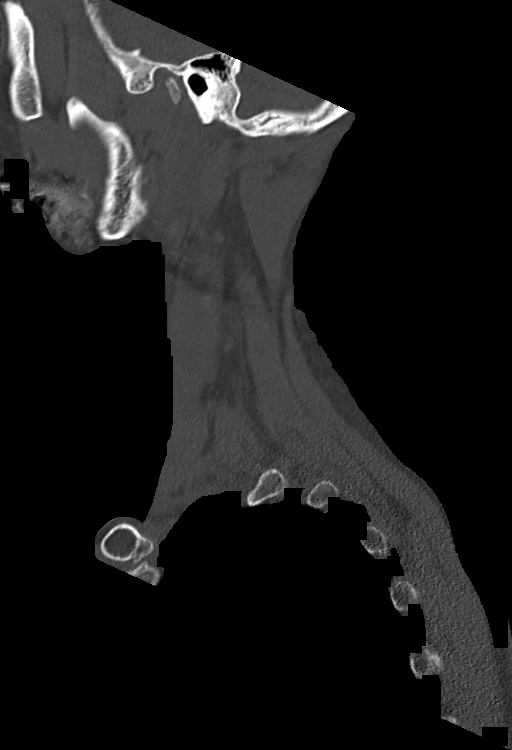
[im 21/63  bone]
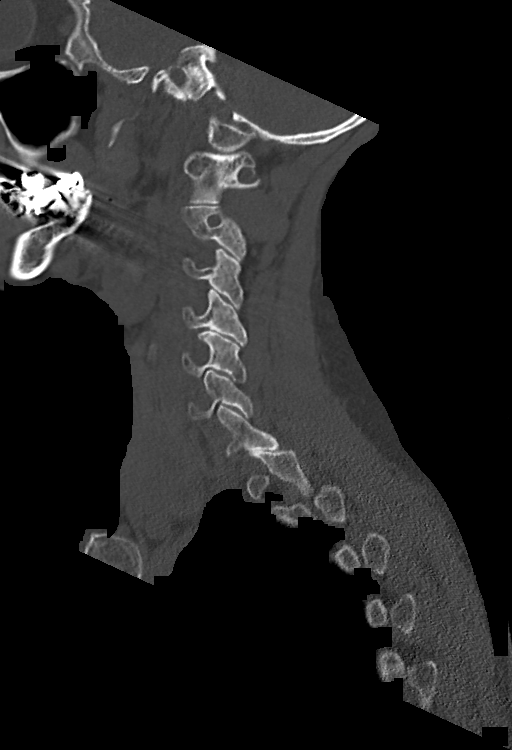
[im 32/63  bone]
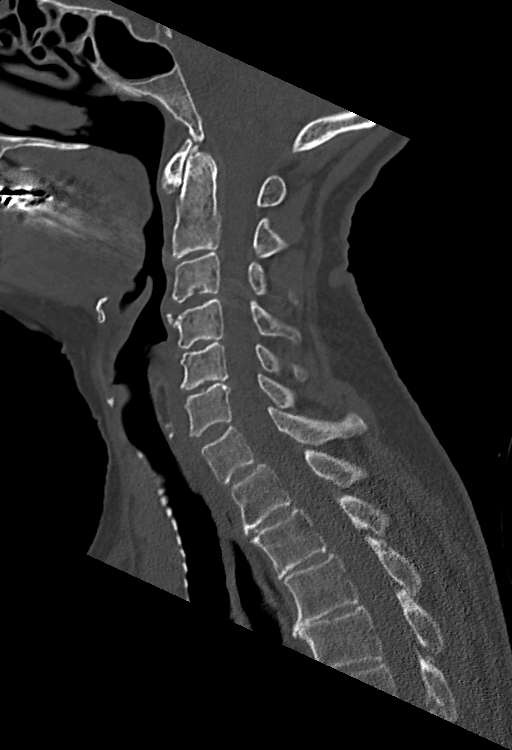
[im 42/63  bone]
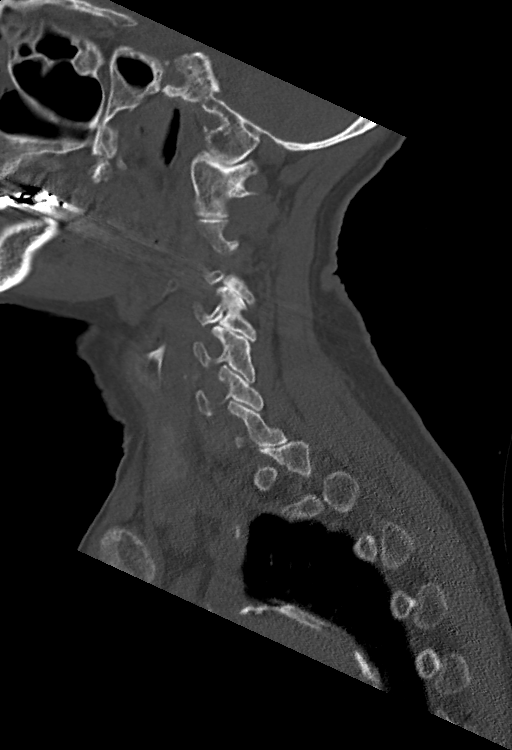
[im 52/63  bone]
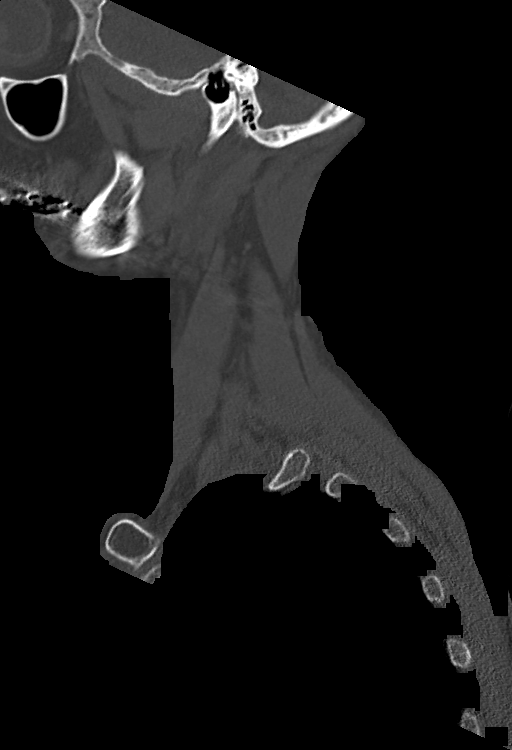

[Series 11: orthogonals · axial · 0.30mm/px · z∈[-298,-230]mm · 3 of 93 slices shown]
[im 19/93  bone]
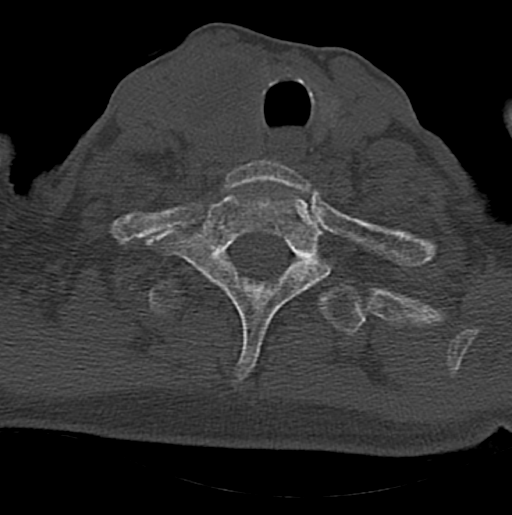
[im 37/93  bone]
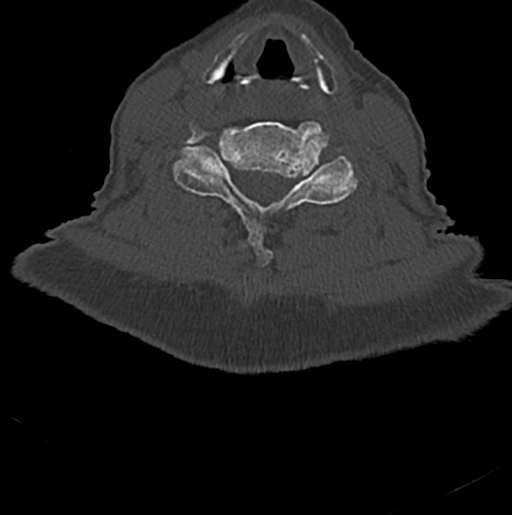
[im 56/93  bone]
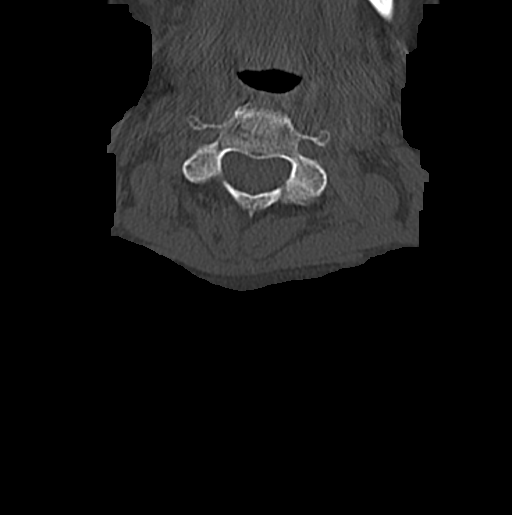

[15 of 33 positions shown; findings below may reference images not displayed]

FINDINGS: CT HEAD FINDINGS

Brain: Generalized atrophy. Normal ventricular morphology. No
midline shift or mass effect. Small vessel chronic ischemic changes
of deep cerebral white matter. No intracranial hemorrhage, mass
lesion, evidence of acute infarction, or extra-axial fluid
collection.

Vascular: Atherosclerotic calcification of internal carotid and
vertebral arteries at skullbase

Skull: Intact

Sinuses/Orbits: Clear

Other: N/A

CT CERVICAL SPINE FINDINGS

Alignment: Normal

Skull base and vertebrae: Visualized skullbase intact. Diffuse
osseous demineralization. Vertebral body more heights maintained. No
fracture, subluxation or bone destruction. Scattered mild facet
degenerative changes.

Soft tissues and spinal canal: Prevertebral soft tissues normal
thickness. Scattered beam hardening artifacts of dental origin.
Large mass within RIGHT thyroid lobe again seen 3.7 x 2.5 cm
slightly increased.

Disc levels: Multilevel disc space narrowing and endplate spur
formation. Uncovertebral spurs encroach upon the C5-C6 neural
foramina bilaterally greater on LEFT.

Upper chest: Lung apices clear

Other: Atherosclerotic calcifications at the aortic arch and in the
proximal great vessels.
IMPRESSION: Atrophy with small vessel chronic ischemic changes of deep cerebral
white matter.

No acute intracranial abnormalities.

Osseous demineralization with degenerative disc and facet disease
changes cervical spine.

No acute cervical spine abnormalities.

Extensive atherosclerotic calcifications as above.

Aortic Atherosclerosis ([id number]).

## 2018-09-02 ENCOUNTER — Emergency Department (HOSPITAL_BASED_OUTPATIENT_CLINIC_OR_DEPARTMENT_OTHER): Payer: Medicare Other

## 2018-09-02 ENCOUNTER — Other Ambulatory Visit: Payer: Self-pay

## 2018-09-02 ENCOUNTER — Encounter (HOSPITAL_BASED_OUTPATIENT_CLINIC_OR_DEPARTMENT_OTHER): Payer: Self-pay

## 2018-09-02 ENCOUNTER — Emergency Department (HOSPITAL_BASED_OUTPATIENT_CLINIC_OR_DEPARTMENT_OTHER)
Admission: EM | Admit: 2018-09-02 | Discharge: 2018-09-02 | Disposition: A | Discharge status: Home / Self Care | Payer: Medicare Other | Attending: Emergency Medicine | Admitting: Emergency Medicine

## 2018-09-02 DIAGNOSIS — I10 Essential (primary) hypertension: Secondary | ICD-10-CM | POA: Insufficient documentation

## 2018-09-02 DIAGNOSIS — Z7984 Long term (current) use of oral hypoglycemic drugs: Secondary | ICD-10-CM | POA: Diagnosis not present

## 2018-09-02 DIAGNOSIS — W01198A Fall on same level from slipping, tripping and stumbling with subsequent striking against other object, initial encounter: Secondary | ICD-10-CM | POA: Insufficient documentation

## 2018-09-02 DIAGNOSIS — Y92000 Kitchen of unspecified non-institutional (private) residence as  the place of occurrence of the external cause: Secondary | ICD-10-CM | POA: Insufficient documentation

## 2018-09-02 DIAGNOSIS — R519 Headache, unspecified: Secondary | ICD-10-CM

## 2018-09-02 DIAGNOSIS — R51 Headache: Secondary | ICD-10-CM | POA: Diagnosis not present

## 2018-09-02 DIAGNOSIS — S0990XD Unspecified injury of head, subsequent encounter: Secondary | ICD-10-CM

## 2018-09-02 DIAGNOSIS — Z79899 Other long term (current) drug therapy: Secondary | ICD-10-CM | POA: Diagnosis not present

## 2018-09-02 DIAGNOSIS — Y998 Other external cause status: Secondary | ICD-10-CM | POA: Diagnosis not present

## 2018-09-02 DIAGNOSIS — E119 Type 2 diabetes mellitus without complications: Secondary | ICD-10-CM | POA: Diagnosis not present

## 2018-09-02 DIAGNOSIS — Z9049 Acquired absence of other specified parts of digestive tract: Secondary | ICD-10-CM | POA: Diagnosis not present

## 2018-09-02 DIAGNOSIS — Y9389 Activity, other specified: Secondary | ICD-10-CM | POA: Diagnosis not present

## 2018-09-02 NOTE — ED Notes (Signed)
Pt reports she fell a week ago and 2 days after the fall she started to have a throbbing pain that feels like her heartbeat in her head.

## 2018-09-02 NOTE — Discharge Instructions (Addendum)
Please see the information and instructions below regarding your visit.  Your diagnoses today include:  1. Injury of head, initial encounter   2. Nonintractable headache, unspecified chronicity pattern, unspecified headache type     You were seen and treated in the emergency department today for headache. Fortunately, your vitals, exam, and work-up is reassuring with no apparent emergent cause for your headache at this time.  Tests performed today include: See side panel of your discharge paperwork for testing performed today. Vital signs are listed at the bottom of these instructions.   Medications prescribed:    I recommend Tylenol for headaches.  You may take 650 mg every 6 hours as needed for pain.  Do not exceed 4000 mg in 1 day.  Take any prescribed medications only as prescribed, and any over the counter medications only as directed on the packaging.  Home care instructions:   Drink plenty of fluids at home. This will help with your headache. Be cautious with caffeine use, as this can cause your headache to rebound when the effects wear off. If you drink more than 2 cups of coffee/caffeinated tea, or caffeinated soda per day, I suggest you wean down that amount.  Please follow any educational materials contained in this packet.   Follow-up instructions: Please follow-up with your primary care provider in 5-7 days for further evaluation of your symptoms if they are not completely improved.   Return instructions:  Please return to the Emergency Department if you experience worsening symptoms. It is VERY important that you monitor your symptoms at home. If you develop worsening headache, new fever, new neck stiffness, rash, focal weakness or numbness, or any other new or concerning symptoms, please return to the ED immediately, as these may be signs that your headache has become a potentially serious and life-threatening condition.  Please return if you have any other emergent  concerns.  Additional Information:   Your vital signs today were: BP (!) 169/68    Pulse 68    Temp 98 F (36.7 C) (Oral)    Resp 18    Ht 5\' 1"  (1.549 m)    Wt 46.7 kg    SpO2 97%    BMI 19.46 kg/m  If your blood pressure (BP) was elevated on multiple readings during this visit above 130 for the top number or above 80 for the bottom number, please have this repeated by your primary care provider within one month. --------------  Thank you for allowing us to participate in your care today.

## 2018-09-02 NOTE — ED Notes (Signed)
ED Provider at bedside. 

## 2018-09-02 NOTE — ED Triage Notes (Signed)
Pt states she has cont'd to have pain to right parietal area and having posterior neck pain since head injury 11/19-pt NAD-steady gait

## 2018-09-02 NOTE — ED Notes (Signed)
Patient verbalizes understanding of discharge instructions. Opportunity for questioning and answers were provided. Armband removed by staff, pt discharged from ED via POV with family.  

## 2018-09-03 NOTE — ED Provider Notes (Signed)
[facility] EMERGENCY DEPARTMENT Provider Note   CSN: [id number] Arrival date & time: 09/02/18  1825     History   Chief Complaint Chief Complaint  Patient presents with  . Head Injury    HPI Kiara Blair is a 84 y.o. female.  HPI   Patient is a 38 female with a history of diabetes mellitus, hypercholesterolemia, hypertension presenting for head pain to palpation in the right parietal region.  Patient reports that this began 1 day if she was evaluated for a head injury after a mechanical fall in her kitchen.  She reports that late in the evening on 08-26-2018, she had a mechanical fall when she missed the step going up into her kitchen.  Patient but she was holding a book at the time, and fell on outstretched hands.  She reports that she hit the front of her head on the ground.  She reports that her musculoskeletal pains have resolved, and she sustained no fractures in incident based on her evaluation on 08-27-2018 in the ED, however the following day, she began experience and daily headaches.  She reports that they begin in the right parietal region, radiate down the neck, and are worse when palpated.  They do not interfere with sleep.  Patient denies any visual disturbance, diplopia, vertigo, lightheadedness, speech disturbance, weakness or numbness in extremities, difficulty walking, nausea or vomiting..  Patient's son does report that she has had some slight short-term memory difficulty particularly with word finding.  Patient does not take any blood thinners nor antiplatelet agents.  Patient denies taking any medications for pain, she reports that she does not like to take medication.  Past Medical History:  Diagnosis Date  . Diabetes mellitus without complication (HCC)   . Hypercholesteremia   . Hypertension     There are no active problems to display for this patient.   Past Surgical History:  Procedure Laterality Date  . ABDOMINAL HYSTERECTOMY    . APPENDECTOMY     . BLADDER SUSPENSION    . CHOLECYSTECTOMY    . TONSILLECTOMY       OB History   None      Home Medications    Prior to Admission medications   Medication Sig Start Date End Date Taking? Authorizing Provider  amLODipine-benazepril (LOTREL) 5-20 MG per capsule Take 1 capsule by mouth daily.    [provider]  benzonatate (TESSALON) 100 MG capsule Take 1 capsule (100 mg total) by mouth every 8 (eight) hours. 12/31/16   Melene Plan, DO  diclofenac sodium (VOLTAREN) 1 % GEL Apply 2 g topically 2 (two) times daily as needed. 07/13/17   Charlynne Pander, MD  glyBURIDE-metformin (GLUCOVANCE) 2.5-500 MG tablet Take 1 tablet by mouth.    [provider]  glyBURIDE-metformin (GLUCOVANCE) 5-500 MG per tablet Take 1 tablet by mouth daily with breakfast.    [provider]  lidocaine (LIDODERM) 5 % Place 1 patch onto the skin daily. Remove & Discard patch within 12 hours or as directed by MD 08/27/18   Long, Arlyss Repress, MD  meclizine (ANTIVERT) 25 MG tablet Take 1 tablet (25 mg total) by mouth 3 (three) times daily as needed for dizziness. 02/01/16   Geoffery Lyons, MD  meclizine (ANTIVERT) 25 MG tablet Take 1 tablet (25 mg total) by mouth 3 (three) times daily as needed for dizziness. 01/08/17   Molpus, John, MD  Multiple Vitamin (MULTIVITAMIN WITH MINERALS) TABS tablet Take 1 tablet by mouth daily. Dr. Linton Flemings Vitamins.  [provider]  PROTEIN PO Take 1 each by mouth daily. Premium Protein shake.    [provider]    Family History No family history on file.  Social History Social History   Tobacco Use  . Smoking status: Never Smoker  . Smokeless tobacco: Never Used  Substance Use Topics  . Alcohol use: No  . Drug use: No     Allergies   Other   Review of Systems Review of Systems  Constitutional: Negative for chills and fever.  HENT: Negative for congestion and sore throat.   Eyes: Negative for visual disturbance.  Respiratory:  Negative for cough, chest tightness and shortness of breath.   Cardiovascular: Negative for chest pain, palpitations and leg swelling.  Gastrointestinal: Negative for abdominal pain, nausea and vomiting.  Genitourinary: Negative for dysuria and flank pain.  Musculoskeletal: Positive for neck pain. Negative for back pain and myalgias.  Skin: Negative for rash.  Neurological: Positive for headaches. Negative for dizziness, syncope and light-headedness.     Physical Exam Updated Vital Signs BP (!) 169/68   Pulse 68   Temp 98 F (36.7 C) (Oral)   Resp 18   Ht 5\' 1"  (1.549 m)   Wt 46.7 kg   SpO2 97%   BMI 19.46 kg/m   Physical Exam  Constitutional: She is oriented to person, place, and time. She appears well-developed and well-nourished. No distress.  HENT:  Head: Normocephalic and atraumatic.  Mouth/Throat: Oropharynx is clear and moist.  Patient is tenderness to palpation of the scalp over the right parietal region.  Does not appear to have any traumatic lesions underlying. No pain over temporal arteries bilaterally. No battle sign.  Eyes: Pupils are equal, round, and reactive to light. Conjunctivae and EOM are normal.  Neck: Normal range of motion. Neck supple.  No nuchal rigidity.  No meningismus. No erythematous lesions extending down into the neck.  Cardiovascular: Normal rate, regular rhythm, S1 normal and S2 normal.  No murmur heard. Pulmonary/Chest: Effort normal and breath sounds normal. She has no wheezes. She has no rales.  Abdominal: Soft. She exhibits no distension. There is no tenderness. There is no guarding.  Musculoskeletal: Normal range of motion. She exhibits no edema or deformity.  Neurological: She is alert and oriented to person, place, and time.  Mental Status:  Alert, oriented, thought content appropriate, able to give a coherent history. Speech fluent without evidence of aphasia. Able to follow 2 step commands without difficulty.  Cranial Nerves:  II:   Peripheral visual fields grossly normal, pupils equal, round, reactive to light III,IV, VI: ptosis not present, extra-ocular motions intact bilaterally  V,VII: smile symmetric, facial light touch sensation equal VIII: hearing grossly normal to voice  X: uvula elevates symmetrically  XI: bilateral shoulder shrug symmetric and strong XII: midline tongue extension without fassiculations Motor:  Normal tone. 5/5 in upper and lower extremities bilaterally including strong and equal grip strength and dorsiflexion/plantar flexion Sensory: Pinprick and light touch normal in all extremities.  Cerebellar: normal finger-to-nose with bilateral upper extremities Gait: normal gait and balance. Ambulates with assistance of her son. Stance: No pronator drift and good coordination, strength, and position sense with tapping of bilateral arms (performed in sitting position). CV: distal pulses palpable throughout   Skin: Skin is warm and dry. No rash noted. No erythema.  Psychiatric: She has a normal mood and affect. Her behavior is normal. Judgment and thought content normal.  Nursing note and vitals reviewed.    ED  Treatments / Results  Labs (all labs ordered are listed, but only abnormal results are displayed) Labs Reviewed - No data to display  EKG None  Radiology Ct Head Wo Contrast  Result Date: 09/02/2018 CLINICAL DATA:  Fall with headache EXAM: CT HEAD WITHOUT CONTRAST CT CERVICAL SPINE WITHOUT CONTRAST TECHNIQUE: Multidetector CT imaging of the head and cervical spine was performed following the standard protocol without intravenous contrast. Multiplanar CT image reconstructions of the cervical spine were also generated. COMPARISON:  CT brain 08/27/2018, CT cervical spine 04/26/2018 FINDINGS: CT HEAD FINDINGS Brain: No acute territorial infarction, hemorrhage or intracranial mass. Stable ventricle size. Mild atrophy and small vessel ischemic changes of the white matter Vascular: No hyperdense  vessels.  Carotid vascular calcification Skull: Normal. Negative for fracture or focal lesion. Sinuses/Orbits: No acute finding. Other: None CT CERVICAL SPINE FINDINGS Alignment: Trace retrolisthesis C4 on C5 and anterolisthesis C7 on T1. Facet alignment is normal Skull base and vertebrae: No acute fracture. No primary bone lesion or focal pathologic process. Soft tissues and spinal canal: No prevertebral fluid or swelling. No visible canal hematoma. Disc levels: Moderate degenerative changes C2-C3, C4-C5 and C5-C6 with mild degenerative change at C3-C4. Multiple level bilateral facet arthropathy results in bilateral foraminal stenosis. Upper chest: Large hypodense mass in the right lobe of the thyroid, stable. Negative lung apices Other: None IMPRESSION: 1. No CT evidence for acute intracranial abnormality. Atrophy and small vessel ischemic changes of the white matter 2. Stable cervical spine alignment without acute fracture. 3. Again noted is a large hypodense mass in the right lobe of thyroid, corresponding to abnormalities noted on prior thyroid ultrasound. Electronically Signed   By: Jasmine Pang M.D.   On: 09/02/2018 20:49   Ct Cervical Spine Wo Contrast  Result Date: 09/02/2018 CLINICAL DATA:  Fall with headache EXAM: CT HEAD WITHOUT CONTRAST CT CERVICAL SPINE WITHOUT CONTRAST TECHNIQUE: Multidetector CT imaging of the head and cervical spine was performed following the standard protocol without intravenous contrast. Multiplanar CT image reconstructions of the cervical spine were also generated. COMPARISON:  CT brain 08/27/2018, CT cervical spine 04/26/2018 FINDINGS: CT HEAD FINDINGS Brain: No acute territorial infarction, hemorrhage or intracranial mass. Stable ventricle size. Mild atrophy and small vessel ischemic changes of the white matter Vascular: No hyperdense vessels.  Carotid vascular calcification Skull: Normal. Negative for fracture or focal lesion. Sinuses/Orbits: No acute finding. Other:  None CT CERVICAL SPINE FINDINGS Alignment: Trace retrolisthesis C4 on C5 and anterolisthesis C7 on T1. Facet alignment is normal Skull base and vertebrae: No acute fracture. No primary bone lesion or focal pathologic process. Soft tissues and spinal canal: No prevertebral fluid or swelling. No visible canal hematoma. Disc levels: Moderate degenerative changes C2-C3, C4-C5 and C5-C6 with mild degenerative change at C3-C4. Multiple level bilateral facet arthropathy results in bilateral foraminal stenosis. Upper chest: Large hypodense mass in the right lobe of the thyroid, stable. Negative lung apices Other: None IMPRESSION: 1. No CT evidence for acute intracranial abnormality. Atrophy and small vessel ischemic changes of the white matter 2. Stable cervical spine alignment without acute fracture. 3. Again noted is a large hypodense mass in the right lobe of thyroid, corresponding to abnormalities noted on prior thyroid ultrasound. Electronically Signed   By: Jasmine Pang M.D.   On: 09/02/2018 20:49    Procedures Procedures (including critical care time)  Medications Ordered in ED Medications - No data to display   Initial Impression / Assessment and Plan / ED Course  I have reviewed  the triage vital signs and the nursing notes.  Pertinent labs & imaging results that were available during my care of the patient were reviewed by me and considered in my medical decision making (see chart for details).   Patient well-appearing, neurologically intact, and in no acute distress on my examination.  Differential diagnosis includes subdural hematoma, concussion, scalp hyperesthesia secondary to neuropathy, tension headache.  Doubt temporal arteritis, as patient is having no pain over temporal arteries, and no visual disturbance, and patient symptoms have been occurring for approximately 5 days without change.  Doubt meningitis, as patient is afebrile, nontoxic-appearing, and has no nuchal rigidity.  Doubt  subarachnoid hemorrhage given the 5-day course of symptoms, normal neurologic exam without changes in mental status, and the focal nature of her headache.  Repeat CT scan without any evidence of intracranial hemorrhage.  CT scan of cervical spine with extensive degenerative changes but no evidence of acute fracture.  Patient has a known large right thyroid nodule, which is followed by her endocrinologist.  With normal imaging, normal neurologic exam, suspect the patient is experiencing concussion given the association with her fall.  Alternatively, suspect possible scalp hyperesthesia from a neuropathic cause.  Herpes zoster lesions are not visualized, and would suspect patient to begin to have zoster lesions at this point in her symptoms, however cannot fully rule this out as cause.  No evidence of Ramsay Hunt syndrome.  Patient is referred to neurology for further work-up.  Recommended to patient to take Tylenol as needed for her symptoms.  Return precautions were given for any worsening headache, headache with any neurologic change such as visual disturbance, changes in mentation, weakness or numbness in extremities, or vertigo.  Patient and her son are in understanding and agree with the plan of care.  This is a shared visit with Dr. Kennis CarinaMichael Bero. Patient was independently evaluated by this attending physician. Attending physician consulted in evaluation and discharge management.  Final Clinical Impressions(s) / ED Diagnoses   Final diagnoses:  Injury of head, initial encounter  Nonintractable headache, unspecified chronicity pattern, unspecified headache type    ED Discharge Orders         Ordered    Ambulatory referral to Neurology    Comments:  An appointment is requested in approximately: 2 weeks   09/02/18 2224           Delia ChimesMurray, Kinta Martis B, PA-C 09/03/18 [id number]    Sabas SousBero, Michael M, MD 09/03/18 2317

## 2018-09-09 ENCOUNTER — Telehealth: Payer: Self-pay | Admitting: *Deleted

## 2018-09-09 ENCOUNTER — Ambulatory Visit: Payer: Medicare Other | Admitting: Neurology

## 2018-09-09 NOTE — Telephone Encounter (Signed)
Appt canceled by son @ 07:56 AM on 09/09/2018 per front office staff.

## 2018-09-10 ENCOUNTER — Encounter: Payer: Self-pay | Admitting: Neurology

## 2018-11-06 ENCOUNTER — Ambulatory Visit: Payer: Medicare Other | Admitting: Neurology

## 2019-09-02 ENCOUNTER — Encounter (HOSPITAL_BASED_OUTPATIENT_CLINIC_OR_DEPARTMENT_OTHER): Payer: Self-pay

## 2019-09-02 ENCOUNTER — Other Ambulatory Visit: Payer: Self-pay

## 2019-09-02 ENCOUNTER — Emergency Department (HOSPITAL_BASED_OUTPATIENT_CLINIC_OR_DEPARTMENT_OTHER)
Admission: EM | Admit: 2019-09-02 | Discharge: 2019-09-02 | Disposition: A | Discharge status: Home / Self Care | Payer: Medicare Other | Attending: Emergency Medicine | Admitting: Emergency Medicine

## 2019-09-02 ENCOUNTER — Emergency Department (HOSPITAL_BASED_OUTPATIENT_CLINIC_OR_DEPARTMENT_OTHER): Payer: Medicare Other

## 2019-09-02 DIAGNOSIS — M25511 Pain in right shoulder: Secondary | ICD-10-CM | POA: Insufficient documentation

## 2019-09-02 DIAGNOSIS — R030 Elevated blood-pressure reading, without diagnosis of hypertension: Secondary | ICD-10-CM

## 2019-09-02 DIAGNOSIS — Z79899 Other long term (current) drug therapy: Secondary | ICD-10-CM | POA: Diagnosis not present

## 2019-09-02 DIAGNOSIS — I10 Essential (primary) hypertension: Secondary | ICD-10-CM | POA: Diagnosis not present

## 2019-09-02 DIAGNOSIS — E119 Type 2 diabetes mellitus without complications: Secondary | ICD-10-CM | POA: Insufficient documentation

## 2019-09-02 DIAGNOSIS — Z7984 Long term (current) use of oral hypoglycemic drugs: Secondary | ICD-10-CM | POA: Insufficient documentation

## 2019-09-02 LAB — PROTIME-INR
INR: 0.9 (ref 0.8–1.2)
Prothrombin Time: 11.9 seconds (ref 11.4–15.2)

## 2019-09-02 LAB — COMPREHENSIVE METABOLIC PANEL
ALT: 17 U/L (ref 0–44)
AST: 24 U/L (ref 15–41)
Albumin: 4 g/dL (ref 3.5–5.0)
Alkaline Phosphatase: 108 U/L (ref 38–126)
Anion gap: 7 (ref 5–15)
BUN: 25 mg/dL — ABNORMAL HIGH (ref 8–23)
CO2: 25 mmol/L (ref 22–32)
Calcium: 9.8 mg/dL (ref 8.9–10.3)
Chloride: 101 mmol/L (ref 98–111)
Creatinine, Ser: 0.62 mg/dL (ref 0.44–1.00)
GFR calc Af Amer: >60 mL/min (ref >60)
GFR calc non Af Amer: >60 mL/min (ref >60)
Glucose, Bld: 139 mg/dL — ABNORMAL HIGH (ref 70–99)
Potassium: 4.2 mmol/L (ref 3.5–5.1)
Sodium: 133 mmol/L — ABNORMAL LOW (ref 135–145)
Total Bilirubin: 0.5 mg/dL (ref 0.3–1.2)
Total Protein: 6.4 g/dL — ABNORMAL LOW (ref 6.5–8.1)

## 2019-09-02 LAB — CBC WITH DIFFERENTIAL/PLATELET
Abs Immature Granulocytes: 0.03 10*3/uL (ref 0.00–0.07)
Basophils Absolute: 0 10*3/uL (ref 0.0–0.1)
Basophils Relative: 0 %
Eosinophils Absolute: 0.1 10*3/uL (ref 0.0–0.5)
Eosinophils Relative: 1 %
HCT: 38.8 % (ref 36.0–46.0)
Hemoglobin: 12.4 g/dL (ref 12.0–15.0)
Immature Granulocytes: 0 %
Lymphocytes Relative: 24 %
Lymphs Abs: 1.8 10*3/uL (ref 0.7–4.0)
MCH: 28.3 pg (ref 26.0–34.0)
MCHC: 32 g/dL (ref 30.0–36.0)
MCV: 88.6 fL (ref 80.0–100.0)
Monocytes Absolute: 0.6 10*3/uL (ref 0.1–1.0)
Monocytes Relative: 8 %
Neutro Abs: 5 10*3/uL (ref 1.7–7.7)
Neutrophils Relative %: 67 %
Platelets: 223 10*3/uL (ref 150–400)
RBC: 4.38 MIL/uL (ref 3.87–5.11)
RDW: 14.2 % (ref 11.5–15.5)
WBC: 7.6 10*3/uL (ref 4.0–10.5)
nRBC: 0 % (ref 0.0–0.2)

## 2019-09-02 LAB — CK: Total CK: 89 U/L (ref 38–234)

## 2019-09-02 MED ORDER — ACETAMINOPHEN 325 MG PO TABS
650.0000 mg | ORAL_TABLET | Freq: Once | ORAL | Status: AC
  Administered 2019-09-02: 650 mg via ORAL
  Filled 2019-09-02: qty 2

## 2019-09-02 NOTE — ED Notes (Signed)
Vitals not obtained per MD, States patient has been having pain in arms and was fine with baseline BP

## 2019-09-02 NOTE — ED Notes (Signed)
Patient transported to XR. 

## 2019-09-02 NOTE — ED Notes (Signed)
ED Provider at bedside. 

## 2019-09-02 NOTE — Discharge Instructions (Addendum)
You have been diagnosed today with right shoulder pain.  At this time there does not appear to be the presence of an emergent medical condition, however there is always the potential for conditions to change. Please read and follow the below instructions.  Please return to the Emergency Department immediately for any new or worsening symptoms. Please be sure to follow up with your Primary Care Provider within one week regarding your visit today; please call their office to schedule an appointment even if you are feeling better for a follow-up visit.  Please use your MyChart account to review all labs and imaging with your primary care provider that were performed today at your next visit. 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  Please discuss these findings with your primary care provider at your next visit.  You have also been given referral to an orthopedic doctor, Dr. Erlinda Hong, you may call today to schedule a follow-up appointment for your chronic right shoulder pain. You may use the sling provided to you today to help with your pain.  You may use over-the-counter anti-inflammatories such as Tylenol as directed on the packaging to help with your symptoms. Additionally your blood pressure was elevated today, please be sure to take your medications as prescribed by your primary care doctor.  Please call your primary care doctor today to schedule a follow-up appointment for blood pressure recheck and medication management within 1 week.  Get help right away if: Your arm, hand, or fingers: Tingle. Are numb. Are swollen. Are painful. Turn white or blue. Get a very bad headache. Start to feel mixed up (confused). Feel weak or numb. Feel faint. Have very bad pain in your: Chest. Belly (abdomen). Throw up more than once. Have trouble breathing. You have any new/concerning or worsening symptoms  Please read the  additional information packets attached to your discharge summary.  Do not take your medicine if  develop an itchy rash, swelling in your mouth or lips, or difficulty breathing; call 911 and seek immediate emergency medical attention if this occurs.  Note: Portions of this text may have been transcribed using voice recognition software. Every effort was made to ensure accuracy; however, inadvertent computerized transcription errors may still be present.

## 2019-09-02 NOTE — ED Triage Notes (Signed)
Pt c/o pain to right UE-states pain started 4-5 days after injection to right shoulder joint at ortho 11/5 for rotator cuff pain-noticed bruising and swelling to UE this am-NAD-slow gait-son with pt

## 2019-09-02 NOTE — ED Provider Notes (Addendum)
[facility] EMERGENCY DEPARTMENT Provider Note   CSN: [id number] Arrival date & time: 09/02/19  1340     History   Chief Complaint Chief Complaint  Patient presents with  . Arm Pain    HPI Kiara Blair is a 85 y.o. female history diabetes, high cholesterol, hypertension presents today for acute on chronic right shoulder pain.  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  She reports that her shoulder has "bone-on-bone" causing her pain.  She describes a sharp pain that is normally mild-moderate in intensity worsened with movement and improved with rest, nonradiating.  She reports that on August 14, 2019 she received a steroid injection in the right shoulder for her chronic pain, she reports that she receives these injections every 3 months.  She reports that on August 19, 2019 she developed increasing pain.  She now describes her pain as severe sharp constant worsened with movement nonradiating and without alleviating factors.  She has attempted Voltaren gel without relief of her symptoms, no other medications.  Additionally patient reports that over the past few days she has noticed bruising and swelling to her right upper arm that is new for her.  Patient denies fever/chills, fall/injury, numbness/tingling, weakness, chest pain/shortness of breath, neck pain, back pain, blood thinner use or any additional concerns today.     HPI  Past Medical History:  Diagnosis Date  . Diabetes mellitus without complication (HCC)   . Hypercholesteremia   . Hypertension     There are no active problems to display for this patient.   Past Surgical History:  Procedure Laterality Date  . ABDOMINAL HYSTERECTOMY    . APPENDECTOMY    . BLADDER SUSPENSION    . CHOLECYSTECTOMY    . TONSILLECTOMY       OB History   No obstetric history on file.      Home Medications    Prior to Admission medications   Medication Sig Start Date End  Date Taking? Authorizing Provider  amLODipine-benazepril (LOTREL) 5-20 MG per capsule Take 1 capsule by mouth daily.    [provider]  benzonatate (TESSALON) 100 MG capsule Take 1 capsule (100 mg total) by mouth every 8 (eight) hours. 12/31/16   Melene PlanFloyd, Dan, DO  diclofenac sodium (VOLTAREN) 1 % GEL Apply 2 g topically 2 (two) times daily as needed. 07/13/17   Charlynne PanderYao, David Hsienta, MD  glyBURIDE-metformin (GLUCOVANCE) 2.5-500 MG tablet Take 1 tablet by mouth.    [provider]  glyBURIDE-metformin (GLUCOVANCE) 5-500 MG per tablet Take 1 tablet by mouth daily with breakfast.    [provider]  lidocaine (LIDODERM) 5 % Place 1 patch onto the skin daily. Remove & Discard patch within 12 hours or as directed by MD 08/27/18   Long, Arlyss RepressJoshua G, MD  meclizine (ANTIVERT) 25 MG tablet Take 1 tablet (25 mg total) by mouth 3 (three) times daily as needed for dizziness. 02/01/16   Geoffery Lyonselo, Douglas, MD  meclizine (ANTIVERT) 25 MG tablet Take 1 tablet (25 mg total) by mouth 3 (three) times daily as needed for dizziness. 01/08/17   Molpus, John, MD  Multiple Vitamin (MULTIVITAMIN WITH MINERALS) TABS tablet Take 1 tablet by mouth daily. Dr. Linton FlemingsWhitakers Vitamins.    [provider]  PROTEIN PO Take 1 each by mouth daily. Premium Protein shake.    [provider]    Family History No family history on file.  Social History Social History   Tobacco Use  .  Smoking status: Never Smoker  . Smokeless tobacco: Never Used  Substance Use Topics  . Alcohol use: No  . Drug use: No     Allergies   Other   Review of Systems Review of Systems Ten systems are reviewed and are negative for acute change except as noted in the HPI   Physical Exam Updated Vital Signs BP (!) 176/76 (BP Location: Left Arm)   Pulse 72   Temp 98.2 F (36.8 C) (Oral)   Resp 18   Ht 5' (1.524 m)   Wt 46.7 kg   SpO2 99%   BMI 20.12 kg/m   Physical Exam Constitutional:      General: She is not  in acute distress.    Appearance: Normal appearance. She is well-developed. She is not ill-appearing or diaphoretic.  HENT:     Head: Normocephalic and atraumatic.     Right Ear: External ear normal.     Left Ear: External ear normal.     Nose: Nose normal.  Eyes:     General: Vision grossly intact. Gaze aligned appropriately.     Pupils: Pupils are equal, round, and reactive to light.  Neck:     Musculoskeletal: Normal range of motion.     Trachea: Trachea and phonation normal. No tracheal deviation.  Cardiovascular:     Rate and Rhythm: Normal rate and regular rhythm.     Pulses:          Radial pulses are 2+ on the right side and 2+ on the left side.  Pulmonary:     Effort: Pulmonary effort is normal. No respiratory distress.  Abdominal:     General: There is no distension.     Palpations: Abdomen is soft.     Tenderness: There is no abdominal tenderness. There is no guarding or rebound.  Musculoskeletal:     Right shoulder: She exhibits decreased range of motion and tenderness. She exhibits no swelling and no deformity.     Right elbow: Normal.    Right wrist: Normal.     Cervical back: Normal.     Thoracic back: Normal.       Arms:     Right hand: Normal.     Comments: No midline C/T/L spinal tenderness to palpation, no paraspinal muscle tenderness, no deformity, crepitus, or step-off noted. No sign of injury to the neck or back. - Right shoulder: Brown/yellow ecchymosis of the anterior right shoulder.  Tenderness to palpation of the deltoid muscle, decreased range of motion secondary to pain.  No induration, fluctuance or increased warmth.  No noticeable swelling when compared to the left shoulder.  Range of motion of elbow, wrist and hand intact without pain.  Radial pulses equal and intact bilaterally.  Capillary refill and sensation intact to all fingers.  Compartments are soft.  Skin:    General: Skin is warm and dry.  Neurological:     Mental Status: She is alert.      GCS: GCS eye subscore is 4. GCS verbal subscore is 5. GCS motor subscore is 6.     Comments: Speech is clear and goal oriented, follows commands Major Cranial nerves without deficit, no facial droop Moves extremities without ataxia, coordination intact  Psychiatric:        Behavior: Behavior normal.    ED Treatments / Results  Labs (all labs ordered are listed, but only abnormal results are displayed) Labs Reviewed  COMPREHENSIVE METABOLIC PANEL - Abnormal; Notable for the following components:  Result Value   Sodium 133 (*)    Glucose, Bld 139 (*)    BUN 25 (*)    Total Protein 6.4 (*)    All other components within normal limits  CBC WITH DIFFERENTIAL/PLATELET  PROTIME-INR  CK    EKG None  Radiology Dg Shoulder Right  Result Date: 09/02/2019 CLINICAL DATA:  Chronic pain with acute exacerbation EXAM: RIGHT SHOULDER - 2+ VIEW COMPARISON:  None. FINDINGS: Oblique, Y scapular, and axillary images were obtained. There is no acute fracture or dislocation. There is superior migration of the right humeral head. There is moderate generalized osteoarthritic change in the glenohumeral and acromioclavicular joints. There is no erosive change or intra-articular calcification. Visualized right lung clear. IMPRESSION: Generalized osteoarthritic change. Superior migration of the right humeral head is likely indicative of chronic rotator cuff tear. No acute fracture or dislocation. Electronically Signed   By: Bretta Bang III M.D.   On: 09/02/2019 14:45    Procedures Procedures (including critical care time)  Medications Ordered in ED Medications  acetaminophen (TYLENOL) tablet 650 mg (650 mg Oral Given 09/02/19 1447)     Initial Impression / Assessment and Plan / ED Course  I have reviewed the triage vital signs and the nursing notes.  Pertinent labs & imaging results that were available during my care of the patient were reviewed by me and considered in my medical decision  making (see chart for details).     DG Right Shoulder:  IMPRESSION:  Generalized osteoarthritic change. Superior migration of the right  humeral head is likely indicative of chronic rotator cuff tear. No  acute fracture or dislocation.   CBC within normal limits PT INR within normal limits CK within normal limits CMP sodium 133; glucose 139; BUN 25; Protein 6.4 - On reevaluation patient is well-appearing no acute distress.  She states understanding of work-up today and is agreeable for discharge and outpatient PCP and orthopedics follow-up.  She has no further questions or concerns.  There is no evidence of DVT, cellulitis, septic arthritis, compartment syndrome or other acute pathology at this time, I suspect acute on chronic pain secondary to arthritis possibly exacerbated by injection 19 days ago, no evidence of infection or other acute pathologies requiring further evaluation or treatment at this time.  Additionally patient noted to be hypertensive today, she is asymptomatic.  She has been informed to take blood pressure medications as prescribed by PCP and follow-up with them for blood pressure recheck and medication management within 1 week.  Patient informed of signs/symptoms of hypertensive urgency/emergency and to return to the ER immediately if they occur.  At this time there does not appear to be any evidence of an acute emergency medical condition and the patient appears stable for discharge with appropriate outpatient follow up. Diagnosis was discussed with patient who verbalizes understanding of care plan and is agreeable to discharge. I have discussed return precautions with patient and family who verbalizes understanding of return precautions. Patient encouraged to follow-up with their PCP and ortho. All questions answered. Patient has been discharged in good condition.  Patient was seen and evaluated by Dr. Renaye Rakers during this visit who agrees with discharge with sling and  orthopedic/PCP follow-up.  Note: Portions of this report may have been transcribed using voice recognition software. Every effort was made to ensure accuracy; however, inadvertent computerized transcription errors may still be present. Final Clinical Impressions(s) / ED Diagnoses   Final diagnoses:  Right shoulder pain, unspecified chronicity  Elevated blood pressure  reading    ED Discharge Orders    None        Elizabeth Palau 09/02/19 1542    Terald Sleeper, MD 09/02/19 1606

## 2020-03-14 ENCOUNTER — Emergency Department (HOSPITAL_BASED_OUTPATIENT_CLINIC_OR_DEPARTMENT_OTHER)
Admission: EM | Admit: 2020-03-14 | Discharge: 2020-03-14 | Disposition: A | Discharge status: Home / Self Care | Payer: Medicare Other | Attending: Emergency Medicine | Admitting: Emergency Medicine

## 2020-03-14 ENCOUNTER — Other Ambulatory Visit: Payer: Self-pay

## 2020-03-14 ENCOUNTER — Encounter (HOSPITAL_BASED_OUTPATIENT_CLINIC_OR_DEPARTMENT_OTHER): Payer: Self-pay | Admitting: Emergency Medicine

## 2020-03-14 ENCOUNTER — Emergency Department (HOSPITAL_BASED_OUTPATIENT_CLINIC_OR_DEPARTMENT_OTHER): Payer: Medicare Other

## 2020-03-14 DIAGNOSIS — Y9301 Activity, walking, marching and hiking: Secondary | ICD-10-CM | POA: Diagnosis not present

## 2020-03-14 DIAGNOSIS — W109XXA Fall (on) (from) unspecified stairs and steps, initial encounter: Secondary | ICD-10-CM | POA: Insufficient documentation

## 2020-03-14 DIAGNOSIS — I10 Essential (primary) hypertension: Secondary | ICD-10-CM | POA: Insufficient documentation

## 2020-03-14 DIAGNOSIS — Z79899 Other long term (current) drug therapy: Secondary | ICD-10-CM | POA: Insufficient documentation

## 2020-03-14 DIAGNOSIS — E119 Type 2 diabetes mellitus without complications: Secondary | ICD-10-CM | POA: Diagnosis not present

## 2020-03-14 DIAGNOSIS — S40021A Contusion of right upper arm, initial encounter: Secondary | ICD-10-CM | POA: Insufficient documentation

## 2020-03-14 DIAGNOSIS — S0990XA Unspecified injury of head, initial encounter: Secondary | ICD-10-CM | POA: Insufficient documentation

## 2020-03-14 DIAGNOSIS — Y929 Unspecified place or not applicable: Secondary | ICD-10-CM | POA: Diagnosis not present

## 2020-03-14 DIAGNOSIS — W19XXXA Unspecified fall, initial encounter: Secondary | ICD-10-CM

## 2020-03-14 DIAGNOSIS — Z7984 Long term (current) use of oral hypoglycemic drugs: Secondary | ICD-10-CM | POA: Diagnosis not present

## 2020-03-14 DIAGNOSIS — Y999 Unspecified external cause status: Secondary | ICD-10-CM | POA: Insufficient documentation

## 2020-03-14 NOTE — ED Triage Notes (Signed)
S/p mechanical fall. Pt states she did hit her head and is unsure about loss of consciousness. States "when I came to I couldn't get up". Pt states she was down for ~20-30 minutes, she thinks. She does report hitting her head, scratch to R elbow. Denies blood thinners.

## 2020-03-14 NOTE — ED Notes (Signed)
Pt to room, undressed into gown.

## 2020-03-14 NOTE — ED Provider Notes (Signed)
[name] EMERGENCY DEPARTMENT Provider Note   CSN: [id number] Arrival date & time: 03/14/20  1903     History Chief Complaint  Patient presents with  . Fall    Kiara Blair is a 86 y.o. female.  Patient is 86 year old female who presents after a fall.  She said she was going up some steps and her feet got tripped up on the last step and she fell forward hitting her head on a bench.  She had a possible loss of consciousness.  She is not sure.  She does have a headache.  She denies any neck pain.  She has some lower back pain that she says is chronic and not sure if it is changed.  She has some pain in her right arm.  She denies any other injuries from the fall.  She is not on anticoagulants.        Past Medical History:  Diagnosis Date  . Diabetes mellitus without complication ([place])   . Hypercholesteremia   . Hypertension     There are no problems to display for this patient.   Past Surgical History:  Procedure Laterality Date  . ABDOMINAL HYSTERECTOMY    . APPENDECTOMY    . BLADDER SUSPENSION    . CHOLECYSTECTOMY    . TONSILLECTOMY       OB History   No obstetric history on file.     No family history on file.  Social History   Tobacco Use  . Smoking status: Never Smoker  . Smokeless tobacco: Never Used  Substance Use Topics  . Alcohol use: No  . Drug use: No    Home Medications Prior to Admission medications   Medication Sig Start Date End Date Taking? Authorizing Provider  propranolol (INDERAL) 10 MG tablet Take 10 mg by mouth 3 (three) times daily.   Yes [provider]  sitaGLIPtin-metformin (JANUMET) 50-500 MG tablet Take 1 tablet by mouth 2 (two) times daily with a meal.   Yes [provider]  amLODipine-benazepril (LOTREL) 5-20 MG per capsule Take 1 capsule by mouth daily.    [provider]  benzonatate (TESSALON) 100 MG capsule Take 1 capsule (100 mg total) by mouth every 8 (eight) hours. 12/31/16   Deno Etienne, DO  diclofenac sodium (VOLTAREN) 1 % GEL Apply 2 g topically 2 (two) times daily as needed. 07/13/17   Drenda Freeze, MD  glyBURIDE-metformin (GLUCOVANCE) 2.5-500 MG tablet Take 1 tablet by mouth.    [provider]  glyBURIDE-metformin (GLUCOVANCE) 5-500 MG per tablet Take 1 tablet by mouth daily with breakfast.    [provider]  lidocaine (LIDODERM) 5 % Place 1 patch onto the skin daily. Remove & Discard patch within 12 hours or as directed by MD 08/27/18   Long, Wonda Olds, MD  meclizine (ANTIVERT) 25 MG tablet Take 1 tablet (25 mg total) by mouth 3 (three) times daily as needed for dizziness. 02/01/16   Veryl Speak, MD  meclizine (ANTIVERT) 25 MG tablet Take 1 tablet (25 mg total) by mouth 3 (three) times daily as needed for dizziness. 01/08/17   Molpus, John, MD  Multiple Vitamin (MULTIVITAMIN WITH MINERALS) TABS tablet Take 1 tablet by mouth daily. Dr. Lenon Oms Vitamins.    [provider]  PROTEIN PO Take 1 each by mouth daily. Premium Protein shake.    [provider]    Allergies    Other  Review of Systems   Review of Systems  Constitutional: Negative  for chills, diaphoresis, fatigue and fever.  HENT: Negative for congestion, rhinorrhea and sneezing.   Eyes: Negative.   Respiratory: Negative for cough, chest tightness and shortness of breath.   Cardiovascular: Negative for chest pain and leg swelling.  Gastrointestinal: Negative for abdominal pain, blood in stool, diarrhea, nausea and vomiting.  Genitourinary: Negative for difficulty urinating, flank pain, frequency and hematuria.  Musculoskeletal: Positive for arthralgias and back pain.  Skin: Negative for rash.  Neurological: Positive for headaches. Negative for dizziness, speech difficulty, weakness and numbness.    Physical Exam Updated Vital Signs BP (!) 159/63   Pulse 65   Resp 16   Ht 5' (1.524 m)   Wt 46.7 kg   SpO2 100%   BMI 20.12 kg/m   Physical  Exam Constitutional:      Appearance: She is well-developed.  HENT:     Head: Normocephalic and atraumatic.  Eyes:     Pupils: Pupils are equal, round, and reactive to light.  Neck:     Comments: Positive tenderness in the lower cervical spine.  No pain to thoracic spine.  There are some mild tenderness to the mid and lower lumbosacral spine.  No step-offs or deformities are noted. Cardiovascular:     Rate and Rhythm: Normal rate and regular rhythm.     Heart sounds: Normal heart sounds.  Pulmonary:     Effort: Pulmonary effort is normal. No respiratory distress.     Breath sounds: Normal breath sounds. No wheezing or rales.  Chest:     Chest wall: No tenderness.  Abdominal:     General: Bowel sounds are normal.     Palpations: Abdomen is soft.     Tenderness: There is no abdominal tenderness. There is no guarding or rebound.  Musculoskeletal:        General: Normal range of motion.     Comments: She complains of pain throughout her right arm although I do not elicit any specific area of tenderness.  She points to the area of her upper arm and then also her lower arm.  There is no specific tenderness on range of motion of the elbow or shoulder.  No pain in the hand or wrist.  There is no other pain on palpation or range of motion of the joints.  Lymphadenopathy:     Cervical: No cervical adenopathy.  Skin:    General: Skin is warm and dry.     Findings: No rash.  Neurological:     General: No focal deficit present.     Mental Status: She is alert and oriented to person, place, and time.     ED Results / Procedures / Treatments   Labs (all labs ordered are listed, but only abnormal results are displayed) Labs Reviewed - No data to display  EKG None  Radiology DG Lumbar Spine Complete  Result Date: 03/14/2020 CLINICAL DATA:  Larey Seat, low back pain EXAM: LUMBAR SPINE - COMPLETE 4+ VIEW COMPARISON:  None. FINDINGS: Frontal, bilateral oblique, and lateral views of the lumbar  spine are obtained. There are 5 non-rib-bearing lumbar type vertebral bodies identified, with mild right convex scoliosis at the thoracolumbar junction. Otherwise alignment is anatomic. There are no acute displaced fractures. There is extensive spondylosis throughout the visualized thoracolumbar spine, greatest at L1/L2 and L5/S1. There is diffuse facet hypertrophy. Sacroiliac joints are normal. IMPRESSION: 1. No acute fracture. 2. Extensive multilevel spondylosis and facet hypertrophy, with mild right convex scoliosis at the thoracolumbar junction. Electronically Signed  By: Sharlet Salina M.D.   On: 03/14/2020 20:49   DG Forearm Right  Result Date: 03/14/2020 CLINICAL DATA:  Larey Seat, pain EXAM: RIGHT FOREARM - 2 VIEW COMPARISON:  07/13/2017 FINDINGS: Frontal and lateral views of the right forearm are obtained. Bones are osteopenic. No acute displaced fracture. Alignment of the right elbow and wrist is anatomic. Soft tissues are normal. IMPRESSION: 1. Unremarkable right forearm. Electronically Signed   By: Sharlet Salina M.D.   On: 03/14/2020 20:48   CT Head Wo Contrast  Result Date: 03/14/2020 CLINICAL DATA:  Larey Seat, headache EXAM: CT HEAD WITHOUT CONTRAST TECHNIQUE: Contiguous axial images were obtained from the base of the skull through the vertex without intravenous contrast. COMPARISON:  08/27/2018 FINDINGS: Brain: No acute infarct or hemorrhage. Lateral ventricles and midline structures are unremarkable. No acute extra-axial fluid collections. No mass effect. Vascular: No hyperdense vessel or unexpected calcification. Skull: Normal. Negative for fracture or focal lesion. Sinuses/Orbits: No acute finding. Other: None. IMPRESSION: 1. Stable exam, no acute process. Electronically Signed   By: Sharlet Salina M.D.   On: 03/14/2020 20:51   DG Humerus Right  Result Date: 03/14/2020 CLINICAL DATA:  Larey Seat, arm pain EXAM: RIGHT HUMERUS - 2+ VIEW COMPARISON:  09/02/2019 FINDINGS: Frontal and lateral views of the  right humerus demonstrate no acute displaced fracture. Complete loss of acromial humeral interval again noted consistent with chronic rotator cuff tear. Soft tissues are unremarkable. Right chest is clear. IMPRESSION: 1. No acute displaced fracture. Electronically Signed   By: Sharlet Salina M.D.   On: 03/14/2020 20:47    Procedures Procedures (including critical care time)  Medications Ordered in ED Medications - No data to display  ED Course  I have reviewed the triage vital signs and the nursing notes.  Pertinent labs & imaging results that were available during my care of the patient were reviewed by me and considered in my medical decision making (see chart for details).    MDM Rules/Calculators/A&P                      Patient presents after mechanical fall.  Head CT shows no evidence of acute abnormality.  She had x-rays of her right arm and lower back which showed no bony injuries.  This was reviewed by me as well.  I had wanted to do a CT scan of her cervical spine but she states she was told because of some gross that she has in her throat that she is not supposed to have any kind of x-rays or radiation to her neck.  I explained to her that we were able to to evaluate whether she has a broken bone in her neck without doing the imaging studies.  I explained to her the risk of an undetected cervical spine fracture.  She does not feel like it hurts very much and does not want any imaging studies done of her neck.  She is excepting the risk and refusing imaging.  Her family members also at bedside.  She was discharged home in good condition.  She was advised in symptomatic care.  Return precautions were given. Final Clinical Impression(s) / ED Diagnoses Final diagnoses:  Fall, initial encounter  Injury of head, initial encounter  Contusion of right upper extremity, initial encounter    Rx / DC Orders ED Discharge Orders    None       Rolan Bucco, MD 03/14/20 2207

## 2020-05-01 ENCOUNTER — Encounter (HOSPITAL_BASED_OUTPATIENT_CLINIC_OR_DEPARTMENT_OTHER): Payer: Self-pay | Admitting: Emergency Medicine

## 2020-05-01 ENCOUNTER — Other Ambulatory Visit: Payer: Self-pay

## 2020-05-01 ENCOUNTER — Emergency Department (HOSPITAL_BASED_OUTPATIENT_CLINIC_OR_DEPARTMENT_OTHER): Payer: Medicare Other

## 2020-05-01 ENCOUNTER — Emergency Department (HOSPITAL_BASED_OUTPATIENT_CLINIC_OR_DEPARTMENT_OTHER)
Admission: EM | Admit: 2020-05-01 | Discharge: 2020-05-01 | Disposition: A | Discharge status: Home / Self Care | Payer: Medicare Other | Attending: Internal Medicine | Admitting: Internal Medicine

## 2020-05-01 DIAGNOSIS — I10 Essential (primary) hypertension: Secondary | ICD-10-CM | POA: Insufficient documentation

## 2020-05-01 DIAGNOSIS — E119 Type 2 diabetes mellitus without complications: Secondary | ICD-10-CM | POA: Diagnosis not present

## 2020-05-01 DIAGNOSIS — Z79899 Other long term (current) drug therapy: Secondary | ICD-10-CM | POA: Diagnosis not present

## 2020-05-01 DIAGNOSIS — R202 Paresthesia of skin: Secondary | ICD-10-CM | POA: Diagnosis present

## 2020-05-01 DIAGNOSIS — R200 Anesthesia of skin: Secondary | ICD-10-CM | POA: Insufficient documentation

## 2020-05-01 DIAGNOSIS — Z7984 Long term (current) use of oral hypoglycemic drugs: Secondary | ICD-10-CM | POA: Diagnosis not present

## 2020-05-01 HISTORY — DX: Tremor, unspecified: R25.1

## 2020-05-01 LAB — CBC WITH DIFFERENTIAL/PLATELET
Abs Immature Granulocytes: 0.04 10*3/uL (ref 0.00–0.07)
Basophils Absolute: 0 10*3/uL (ref 0.0–0.1)
Basophils Relative: 0 %
Eosinophils Absolute: 0.3 10*3/uL (ref 0.0–0.5)
Eosinophils Relative: 4 %
HCT: 37.8 % (ref 36.0–46.0)
Hemoglobin: 12.4 g/dL (ref 12.0–15.0)
Immature Granulocytes: 1 %
Lymphocytes Relative: 29 %
Lymphs Abs: 1.7 10*3/uL (ref 0.7–4.0)
MCH: 28.2 pg (ref 26.0–34.0)
MCHC: 32.8 g/dL (ref 30.0–36.0)
MCV: 85.9 fL (ref 80.0–100.0)
Monocytes Absolute: 0.7 10*3/uL (ref 0.1–1.0)
Monocytes Relative: 11 %
Neutro Abs: 3.4 10*3/uL (ref 1.7–7.7)
Neutrophils Relative %: 55 %
Platelets: 251 10*3/uL (ref 150–400)
RBC: 4.4 MIL/uL (ref 3.87–5.11)
RDW: 14 % (ref 11.5–15.5)
WBC: 6.1 10*3/uL (ref 4.0–10.5)
nRBC: 0 % (ref 0.0–0.2)

## 2020-05-01 LAB — URINALYSIS, ROUTINE W REFLEX MICROSCOPIC
Bilirubin Urine: NEGATIVE
Glucose, UA: NEGATIVE mg/dL
Hgb urine dipstick: NEGATIVE
Ketones, ur: NEGATIVE mg/dL
Nitrite: NEGATIVE
Protein, ur: NEGATIVE mg/dL
Specific Gravity, Urine: 1.01 (ref 1.005–1.030)
pH: 7 (ref 5.0–8.0)

## 2020-05-01 LAB — COMPREHENSIVE METABOLIC PANEL
ALT: 16 U/L (ref 0–44)
AST: 23 U/L (ref 15–41)
Albumin: 3.8 g/dL (ref 3.5–5.0)
Alkaline Phosphatase: 95 U/L (ref 38–126)
Anion gap: 11 (ref 5–15)
BUN: 19 mg/dL (ref 8–23)
CO2: 26 mmol/L (ref 22–32)
Calcium: 9.1 mg/dL (ref 8.9–10.3)
Chloride: 101 mmol/L (ref 98–111)
Creatinine, Ser: 0.7 mg/dL (ref 0.44–1.00)
GFR calc Af Amer: >60 mL/min (ref >60)
GFR calc non Af Amer: >60 mL/min (ref >60)
Glucose, Bld: 168 mg/dL — ABNORMAL HIGH (ref 70–99)
Potassium: 3.9 mmol/L (ref 3.5–5.1)
Sodium: 138 mmol/L (ref 135–145)
Total Bilirubin: 0.4 mg/dL (ref 0.3–1.2)
Total Protein: 6.6 g/dL (ref 6.5–8.1)

## 2020-05-01 LAB — MAGNESIUM: Magnesium: 1.7 mg/dL (ref 1.7–2.4)

## 2020-05-01 LAB — URINALYSIS, MICROSCOPIC (REFLEX)

## 2020-05-01 MED ORDER — LORAZEPAM 2 MG/ML IJ SOLN
0.5000 mg | Freq: Once | INTRAMUSCULAR | Status: AC
  Administered 2020-05-01: 0.5 mg via INTRAVENOUS
  Filled 2020-05-01: qty 1

## 2020-05-01 NOTE — Consult Note (Signed)
[facility] Tele[facility]urology Consult Services  Stat Consult  Date of Service:   05/01/2020 07:09:55  Impression:     .  Numbness - r/o stroke vs  seizures  Comments/Sign-Out: 86 y/o woman with h/o HTN, DM and hyperlipidemia who presents with waxing and waning and left arm for "about 3 weeks" and now worse over the past week. Possible Jacksonian seizure?  CT HEAD: Reviewed  Metrics: [facility] Notification Time: 05/01/2020 07:07:50 Stamp Time: 05/01/2020 07:09:55 Callback Response Time: 05/01/2020 07:15:01  Our recommendations are outlined below.  Recommendations:     .  EEG     .  ASA     .  Covid testing  Imaging Studies:     .  MRI Head Without Contrast     .  MRA Head Without Contrast  Disposition: Neurology Follow Up Recommended  Sign Out:     .  Discussed with Emergency Department Provider  ----------------------------------------------------------------------------------------------------  Chief Complaint: left arm and face numbness  History of Present Illness:  86 y/o woman with h/o HTN, DM and hyperlipidemia who presents with waxing and waning and left arm for "about 3 weeks" and now worse over the past week. STAT teleneurology consult requested. Son at bedside says this has been a chronic issues where it would start in her left hand and travel to her face. Son says to this has been ongoing since she was 15 years. CT brain reviewed and case discussed with ED attending. Patient not vaccinated against Covid. Patient denies seizure. She denies finger or arm twitching when she has the numbness. NIHSS 0       Examination: BP(128/55), Pulse(68), Blood Glucose(201) 1A: Level of Consciousness - Alert; keenly responsive + 0 1B: Ask Month and Age - Both Questions Right + 0 1C: Blink Eyes & Squeeze Hands - Performs Both Tasks + 0 2: Test Horizontal Extraocular Movements - Normal + 0 3: Test Visual Fields - No Visual Loss + 0 4: Test Facial  Palsy (Use Grimace if Obtunded) - Normal symmetry + 0 5A: Test Left Arm Motor Drift - No Drift for 10 Seconds + 0 5B: Test Right Arm Motor Drift - No Drift for 10 Seconds + 0 6A: Test Left Leg Motor Drift - No Drift for 5 Seconds + 0 6B: Test Right Leg Motor Drift - No Drift for 5 Seconds + 0 7: Test Limb Ataxia (FNF/Heel-Shin) - No Ataxia + 0 8: Test Sensation - Normal; No sensory loss + 0 9: Test Language/Aphasia - Normal; No aphasia + 0 10: Test Dysarthria - Normal + 0 11: Test Extinction/Inattention - No abnormality + 0  NIHSS Score: 0   Patient/Family was informed the Neurology Consult would occur via TeleHealth consult by way of interactive audio and video telecommunications and consented to receiving care in this manner.  Patient is being evaluated for possible acute neurologic impairment and high probability of imminent or life-threatening deterioration. I spent total of 15 minutes providing care to this patient, including time for face to face visit via telemedicine, review of medical records, imaging studies and discussion of findings with providers, the patient and/or family.   Dr Cleatrice Burke   [facility] [phone]  Case [id number]

## 2020-05-01 NOTE — ED Notes (Signed)
Pt transported to MRI 

## 2020-05-01 NOTE — ED Notes (Signed)
ED Provider at bedside. 

## 2020-05-01 NOTE — ED Triage Notes (Signed)
Left arm numbness for about a week. This morning she began to complain of numbness to left face and shoulder. Described as "being asleep."

## 2020-05-01 NOTE — ED Notes (Signed)
Pt and son having questions about admission Kiara Alvine MD made aware.

## 2020-05-01 NOTE — ED Notes (Signed)
Pt reports chronic bilateral shoulder pain. Also reports history of having numbness to left hand radiating into left shoulder states that this numbness was different because it started in her shoulder and went into her left face and down her left arm.

## 2020-05-01 NOTE — ED Notes (Signed)
MRI not ready for pt at this time, will give ativan prior to MRI

## 2020-05-01 NOTE — Discharge Instructions (Signed)
It was recommended that you stay and be admitted to the hospital for further work-up, especially for seizure.  If you change your mind or develop any new or worsening or recurrent symptoms, return to this ER or especially [facility] ER.  Otherwise follow-up with your primary care physician and the neurologist.

## 2020-05-01 NOTE — ED Provider Notes (Signed)
[facility]-EMERGENCY DEPT Provider Note: Kiara DellJ. Lane Kamren Heskett, MD, FACEP  CSN: [id number] MRN: [id number] ARRIVAL: 05/01/20 at 0516 ROOM: [facility]01/[facility]01   CHIEF COMPLAINT  Numbness   HISTORY OF PRESENT ILLNESS  05/01/20 5:32 AM [place] LionsLois Blair is a 86 y.o. female with a longstanding (years) history of intermittent numbness/paresthesias in the left arm.  She is here with numbness that began about 330 this morning, or at least she noticed at 330 this morning, when she got up to use the bathroom.  She also felt the numbness involving her face as well.  After about an hour she called her son who brought her to this ED.  Her symptoms have subsequently resolved.  The patient thinks she had some left-sided facial droop earlier but the son states he saw no droop.  She denies any peripheral weakness or numbness in her leg.  Nothing made the symptoms better or worse.  She is unable to say how her symptoms were but her arm was not completely insensate.  Past Medical History:  Diagnosis Date  . Diabetes mellitus without complication (HCC)   . Hypercholesteremia   . Hypertension   . Tremor    right arm    Past Surgical History:  Procedure Laterality Date  . ABDOMINAL HYSTERECTOMY    . APPENDECTOMY    . BLADDER SUSPENSION    . CHOLECYSTECTOMY    . EYE SURGERY    . TONSILLECTOMY      History reviewed. No pertinent family history.  Social History   Tobacco Use  . Smoking status: Never Smoker  . Smokeless tobacco: Never Used  Vaping Use  . Vaping Use: Never used  Substance Use Topics  . Alcohol use: No  . Drug use: No    Prior to Admission medications   Medication Sig Start Date End Date Taking? Authorizing Provider  amLODipine-benazepril (LOTREL) 5-20 MG per capsule Take 1 capsule by mouth daily.    [provider]  benzonatate (TESSALON) 100 MG capsule Take 1 capsule (100 mg total) by mouth every 8 (eight) hours. 12/31/16   Melene PlanFloyd, Dan, DO  diclofenac sodium (VOLTAREN) 1 % GEL Apply 2 g  topically 2 (two) times daily as needed. 07/13/17   Charlynne PanderYao, David Hsienta, MD  glyBURIDE-metformin (GLUCOVANCE) 2.5-500 MG tablet Take 1 tablet by mouth.    [provider]  glyBURIDE-metformin (GLUCOVANCE) 5-500 MG per tablet Take 1 tablet by mouth daily with breakfast.    [provider]  KAZANO 12.5-500 MG TABS Take 1 tablet by mouth 2 (two) times daily. 04/21/20   [provider]  lidocaine (LIDODERM) 5 % Place 1 patch onto the skin daily. Remove & Discard patch within 12 hours or as directed by MD 08/27/18   Long, Arlyss RepressJoshua G, MD  meclizine (ANTIVERT) 25 MG tablet Take 1 tablet (25 mg total) by mouth 3 (three) times daily as needed for dizziness. 02/01/16   Geoffery Lyonselo, Douglas, MD  meclizine (ANTIVERT) 25 MG tablet Take 1 tablet (25 mg total) by mouth 3 (three) times daily as needed for dizziness. 01/08/17   Morrigan Wickens, MD  Multiple Vitamin (MULTIVITAMIN WITH MINERALS) TABS tablet Take 1 tablet by mouth daily. Dr. Linton FlemingsWhitakers Vitamins.    [provider]  propranolol (INDERAL) 10 MG tablet Take 10 mg by mouth 3 (three) times daily.    [provider]  PROTEIN PO Take 1 each by mouth daily. Premium Protein shake.    [provider]  sitaGLIPtin-metformin (JANUMET) 50-500 MG tablet Take 1 tablet by  mouth 2 (two) times daily with a meal.    [provider]    Allergies Doxycycline hyclate, Levofloxacin, Meloxicam, Naproxen-esomeprazole, Nitrofurantoin, Other, and Pneumococcal polysaccharide vaccine   REVIEW OF SYSTEMS  Negative except as noted here or in the History of Present Illness.   PHYSICAL EXAMINATION  Initial Vital Signs Blood pressure (!) 176/59, pulse 76, temperature 98.3 F (36.8 C), temperature source Oral, resp. rate 22, weight 50.4 kg, SpO2 97 %.  Examination General: Well-developed, well-nourished female in no acute distress; appearance consistent with age of record HENT: normocephalic; atraumatic Eyes: Pupils pinpoint, right  pupil slightly irregular; extraocular muscles intact Neck: supple; no carotid bruit Heart: regular rate and rhythm Lungs: clear to auscultation bilaterally Abdomen: soft; nondistended; nontender; bowel sounds present Extremities: Arthritic changes; pulses normal  Neurologic: Awake, alert and oriented; motor function intact in all extremities and symmetric; sensation intact and symmetric; no facial droop Skin: Warm and dry Psychiatric: Normal mood and affect   RESULTS  Summary of this visit's results, reviewed and interpreted by myself:   EKG Interpretation  Date/Time:  Saturday May 01 2020 05:29:30 EDT Ventricular Rate:  74 PR Interval:    QRS Duration: 118 QT Interval:  414 QTC Calculation: 460 R Axis:   48 Text Interpretation: Sinus rhythm Borderline short PR interval Nonspecific intraventricular conduction delay Low voltage, extremity leads Baseline wander in lead(s) I III aVL aVF V1 No significant change was found Confirmed by Paula Libra ([id number]) on 05/01/2020 5:35:21 AM      Laboratory Studies: Results for orders placed or performed during the hospital encounter of 05/01/20 (from the past 24 hour(s))  CBC with Differential/Platelet     Status: None   Collection Time: 05/01/20  6:09 AM  Result Value Ref Range   WBC 6.1 4.0 - 10.5 K/uL   RBC 4.40 3.87 - 5.11 MIL/uL   Hemoglobin 12.4 12.0 - 15.0 g/dL   HCT 30.0 36 - 46 %   MCV 85.9 80.0 - 100.0 fL   MCH 28.2 26.0 - 34.0 pg   MCHC 32.8 30.0 - 36.0 g/dL   RDW 92.3 30.0 - 76.2 %   Platelets 251 150 - 400 K/uL   nRBC 0.0 0.0 - 0.2 %   Neutrophils Relative % 55 %   Neutro Abs 3.4 1.7 - 7.7 K/uL   Lymphocytes Relative 29 %   Lymphs Abs 1.7 0.7 - 4.0 K/uL   Monocytes Relative 11 %   Monocytes Absolute 0.7 0 - 1 K/uL   Eosinophils Relative 4 %   Eosinophils Absolute 0.3 0 - 0 K/uL   Basophils Relative 0 %   Basophils Absolute 0.0 0 - 0 K/uL   Immature Granulocytes 1 %   Abs Immature Granulocytes 0.04 0.00 - 0.07 K/uL   Comprehensive metabolic panel     Status: Abnormal   Collection Time: 05/01/20  6:09 AM  Result Value Ref Range   Sodium 138 135 - 145 mmol/L   Potassium 3.9 3.5 - 5.1 mmol/L   Chloride 101 98 - 111 mmol/L   CO2 26 22 - 32 mmol/L   Glucose, Bld 168 (H) 70 - 99 mg/dL   BUN 19 8 - 23 mg/dL   Creatinine, Ser 2.63 0.44 - 1.00 mg/dL   Calcium 9.1 8.9 - 33.5 mg/dL   Total Protein 6.6 6.5 - 8.1 g/dL   Albumin 3.8 3.5 - 5.0 g/dL   AST 23 15 - 41 U/L   ALT 16 0 - 44 U/L  Alkaline Phosphatase 95 38 - 126 U/L   Total Bilirubin 0.4 0.3 - 1.2 mg/dL   GFR calc non Af Amer >60 >60 mL/min   GFR calc Af Amer >60 >60 mL/min   Anion gap 11 5 - 15  Magnesium     Status: None   Collection Time: 05/01/20  6:09 AM  Result Value Ref Range   Magnesium 1.7 1.7 - 2.4 mg/dL  Urinalysis, Routine w reflex microscopic     Status: Abnormal   Collection Time: 05/01/20  6:22 AM  Result Value Ref Range   Color, Urine YELLOW YELLOW   APPearance CLEAR CLEAR   Specific Gravity, Urine 1.010 1.005 - 1.030   pH 7.0 5.0 - 8.0   Glucose, UA NEGATIVE NEGATIVE mg/dL   Hgb urine dipstick NEGATIVE NEGATIVE   Bilirubin Urine NEGATIVE NEGATIVE   Ketones, ur NEGATIVE NEGATIVE mg/dL   Protein, ur NEGATIVE NEGATIVE mg/dL   Nitrite NEGATIVE NEGATIVE   Leukocytes,Ua SMALL (A) NEGATIVE  Urinalysis, Microscopic (reflex)     Status: Abnormal   Collection Time: 05/01/20  6:22 AM  Result Value Ref Range   RBC / HPF 0-5 0 - 5 RBC/hpf   WBC, UA 0-5 0 - 5 WBC/hpf   Bacteria, UA RARE (A) NONE SEEN   Squamous Epithelial / LPF 0-5 0 - 5   Imaging Studies: CT Head Wo Contrast  Result Date: 05/01/2020 CLINICAL DATA:  Left arm numbness for 1 week EXAM: CT HEAD WITHOUT CONTRAST TECHNIQUE: Contiguous axial images were obtained from the base of the skull through the vertex without intravenous contrast. COMPARISON:  Head CT 03/14/2020 FINDINGS: Brain: There is no mass, hemorrhage or extra-axial collection. The size and configuration of  the ventricles and extra-axial CSF spaces are normal. The brain parenchyma is normal, without acute or chronic infarction. Vascular: No abnormal hyperdensity of the major intracranial arteries or dural venous sinuses. No intracranial atherosclerosis. Skull: The visualized skull base, calvarium and extracranial soft tissues are normal. Sinuses/Orbits: No fluid levels or advanced mucosal thickening of the visualized paranasal sinuses. No mastoid or middle ear effusion. The orbits are normal. IMPRESSION: Normal head CT. Electronically Signed   By: Deatra Robinson M.D.   On: 05/01/2020 06:11   MR BRAIN WO CONTRAST  Result Date: 05/01/2020 CLINICAL DATA:  Left facial numbness beginning today. Left arm numbness for 3 weeks. EXAM: MRI HEAD WITHOUT CONTRAST TECHNIQUE: Multiplanar, multiecho pulse sequences of the brain and surrounding structures were obtained without intravenous contrast. COMPARISON:  Head CT 05/01/2020 FINDINGS: Brain: There is no evidence of acute infarct, intracranial hemorrhage, mass, midline shift, or extra-axial fluid collection. Scattered small foci of T2 hyperintensity in the cerebral white matter bilaterally and in the pons are nonspecific but compatible with mild chronic small vessel ischemic disease. The ventricles and sulci are normal for age. There are 1 or 2 tiny chronic infarcts in the left cerebellar hemisphere. There are also chronic lacunar infarcts in the basal ganglia and thalami. Vascular: Major intracranial vascular flow voids are preserved. Skull and upper cervical spine: Unremarkable bone marrow signal. Sinuses/Orbits: Bilateral cataract extraction. Scattered mild mucosal thickening in the paranasal sinuses. Clear mastoid air cells. Other: None. IMPRESSION: 1. No acute intracranial abnormality. 2. 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 Electronically Signed   By: Sebastian Ache M.D.   On: 05/01/2020 11:09    ED COURSE and MDM  Nursing  notes, initial and subsequent vitals signs, including pulse oximetry, reviewed and interpreted by  myself.  Vitals:   05/01/20 0952 05/01/20 1030 05/01/20 1100 05/01/20 1130  BP: (!) 153/70 (!) 138/56 (!) 129/56 (!) 137/48  Pulse: 63 62 60 56  Resp: 15 15 19 19   Temp:      TempSrc:      SpO2: 97% 98% 98% 92%  Weight:       Medications  LORazepam (ATIVAN) injection 0.5 mg (0.5 mg Intravenous Given 05/01/20 0941)    5:36 AM On arrival patient stroke score is 0.  Code stroke was not called.  7:00 AM Signed out to Dr. 05/03/20.  Teleneurology consult pending.  PROCEDURES  Procedures   ED DIAGNOSES     ICD-10-CM   1. Left sided numbness  R20.0        Kingsly Kloepfer, MD 05/01/20 2239

## 2020-05-01 NOTE — ED Provider Notes (Addendum)
7:53 AM Discussed with patient and teleneurology.  The patient has been having this on and off for quite some time.  Neuro feels that stroke needs to be ruled out but is much less likely.  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  Needs EEG and admission is recommended.  I have discussed with patient and she is okay with being admitted.  We will consult hospitalist.  8:17 AM Discussed with Dr. Katrinka Blazing for admission. Will check a magnesium as well.   Pricilla Loveless, MD 05/01/20 0817  11:25 AM Patient is currently waiting on a bed.  Son has talked with patient and they do not want to stay and be admitted.  The MRI has been completed and is negative for acute abnormalities.  I discussed this is not enough to rule out seizure and we discussed the need for EEG and that this will take a lot longer as an outpatient.  Family and patient seemed understand.  They would still like to be discharged home we have discussed return precautions and need for outpatient follow-up.   Pricilla Loveless, MD 05/01/20 1126

## 2021-01-03 ENCOUNTER — Other Ambulatory Visit: Payer: Self-pay

## 2021-01-03 ENCOUNTER — Emergency Department (HOSPITAL_BASED_OUTPATIENT_CLINIC_OR_DEPARTMENT_OTHER): Payer: Medicare Other

## 2021-01-03 ENCOUNTER — Inpatient Hospital Stay (HOSPITAL_BASED_OUTPATIENT_CLINIC_OR_DEPARTMENT_OTHER)
Admission: EM | Admit: 2021-01-03 | Discharge: 2021-01-05 | DRG: 069 | Disposition: A | Discharge status: Home Health | Payer: Medicare Other | Attending: Internal Medicine | Admitting: Internal Medicine

## 2021-01-03 ENCOUNTER — Observation Stay (HOSPITAL_COMMUNITY): Payer: Medicare Other

## 2021-01-03 ENCOUNTER — Encounter (HOSPITAL_BASED_OUTPATIENT_CLINIC_OR_DEPARTMENT_OTHER): Payer: Self-pay

## 2021-01-03 DIAGNOSIS — R200 Anesthesia of skin: Secondary | ICD-10-CM | POA: Diagnosis not present

## 2021-01-03 DIAGNOSIS — Z20822 Contact with and (suspected) exposure to covid-19: Secondary | ICD-10-CM | POA: Diagnosis present

## 2021-01-03 DIAGNOSIS — E119 Type 2 diabetes mellitus without complications: Secondary | ICD-10-CM | POA: Diagnosis not present

## 2021-01-03 DIAGNOSIS — M24411 Recurrent dislocation, right shoulder: Secondary | ICD-10-CM | POA: Diagnosis not present

## 2021-01-03 DIAGNOSIS — E785 Hyperlipidemia, unspecified: Secondary | ICD-10-CM | POA: Diagnosis present

## 2021-01-03 DIAGNOSIS — G459 Transient cerebral ischemic attack, unspecified: Secondary | ICD-10-CM

## 2021-01-03 DIAGNOSIS — R197 Diarrhea, unspecified: Secondary | ICD-10-CM | POA: Diagnosis not present

## 2021-01-03 DIAGNOSIS — G250 Essential tremor: Secondary | ICD-10-CM | POA: Diagnosis not present

## 2021-01-03 DIAGNOSIS — R001 Bradycardia, unspecified: Secondary | ICD-10-CM | POA: Diagnosis present

## 2021-01-03 DIAGNOSIS — E1165 Type 2 diabetes mellitus with hyperglycemia: Secondary | ICD-10-CM | POA: Diagnosis not present

## 2021-01-03 DIAGNOSIS — Z9181 History of falling: Secondary | ICD-10-CM | POA: Diagnosis not present

## 2021-01-03 DIAGNOSIS — I10 Essential (primary) hypertension: Secondary | ICD-10-CM | POA: Diagnosis present

## 2021-01-03 DIAGNOSIS — Z7984 Long term (current) use of oral hypoglycemic drugs: Secondary | ICD-10-CM | POA: Diagnosis not present

## 2021-01-03 DIAGNOSIS — Z8673 Personal history of transient ischemic attack (TIA), and cerebral infarction without residual deficits: Secondary | ICD-10-CM | POA: Diagnosis present

## 2021-01-03 DIAGNOSIS — I639 Cerebral infarction, unspecified: Secondary | ICD-10-CM | POA: Diagnosis present

## 2021-01-03 HISTORY — DX: Transient cerebral ischemic attack, unspecified: G45.9

## 2021-01-03 LAB — CBC
HCT: 42.8 % (ref 36.0–46.0)
Hemoglobin: 14.3 g/dL (ref 12.0–15.0)
MCH: 29.3 pg (ref 26.0–34.0)
MCHC: 33.4 g/dL (ref 30.0–36.0)
MCV: 87.7 fL (ref 80.0–100.0)
Platelets: 270 10*3/uL (ref 150–400)
RBC: 4.88 MIL/uL (ref 3.87–5.11)
RDW: 14.4 % (ref 11.5–15.5)
WBC: 9.6 10*3/uL (ref 4.0–10.5)
nRBC: 0 % (ref 0.0–0.2)

## 2021-01-03 LAB — COMPREHENSIVE METABOLIC PANEL
ALT: 20 U/L (ref 0–44)
AST: 23 U/L (ref 15–41)
Albumin: 3.9 g/dL (ref 3.5–5.0)
Alkaline Phosphatase: 95 U/L (ref 38–126)
Anion gap: 9 (ref 5–15)
BUN: 32 mg/dL — ABNORMAL HIGH (ref 8–23)
CO2: 24 mmol/L (ref 22–32)
Calcium: 9.9 mg/dL (ref 8.9–10.3)
Chloride: 102 mmol/L (ref 98–111)
Creatinine, Ser: 0.62 mg/dL (ref 0.44–1.00)
GFR, Estimated: >60 mL/min (ref >60)
Glucose, Bld: 145 mg/dL — ABNORMAL HIGH (ref 70–99)
Potassium: 3.8 mmol/L (ref 3.5–5.1)
Sodium: 135 mmol/L (ref 135–145)
Total Bilirubin: 0.2 mg/dL — ABNORMAL LOW (ref 0.3–1.2)
Total Protein: 6.8 g/dL (ref 6.5–8.1)

## 2021-01-03 LAB — TROPONIN I (HIGH SENSITIVITY): Troponin I (High Sensitivity): 5 ng/L (ref <18)

## 2021-01-03 LAB — RESP PANEL BY RT-PCR (FLU A&B, COVID) ARPGX2
Influenza A by PCR: NEGATIVE
Influenza B by PCR: NEGATIVE
SARS Coronavirus 2 by RT PCR: NEGATIVE

## 2021-01-03 LAB — RAPID URINE DRUG SCREEN, HOSP PERFORMED
Amphetamines: NOT DETECTED
Barbiturates: NOT DETECTED
Benzodiazepines: NOT DETECTED
Cocaine: NOT DETECTED
Opiates: NOT DETECTED
Tetrahydrocannabinol: NOT DETECTED

## 2021-01-03 LAB — URINALYSIS, ROUTINE W REFLEX MICROSCOPIC
Bilirubin Urine: NEGATIVE
Glucose, UA: NEGATIVE mg/dL
Hgb urine dipstick: NEGATIVE
Ketones, ur: NEGATIVE mg/dL
Nitrite: NEGATIVE
Protein, ur: NEGATIVE mg/dL
Specific Gravity, Urine: 1.01 (ref 1.005–1.030)
pH: 5 (ref 5.0–8.0)

## 2021-01-03 LAB — URINALYSIS, MICROSCOPIC (REFLEX)

## 2021-01-03 LAB — PROTIME-INR
INR: 0.9 (ref 0.8–1.2)
Prothrombin Time: 12 seconds (ref 11.4–15.2)

## 2021-01-03 MED ORDER — ACETAMINOPHEN 650 MG RE SUPP: 650.0000 mg | RECTAL | Status: DC | PRN

## 2021-01-03 MED ORDER — ENOXAPARIN SODIUM 30 MG/0.3ML ~~LOC~~ SOLN
30.0000 mg | Freq: Every day | SUBCUTANEOUS | Status: DC
  Administered 2021-01-04 – 2021-01-05 (×2): 30 mg via SUBCUTANEOUS
  Filled 2021-01-03 (×2): qty 0.3

## 2021-01-03 MED ORDER — ALPRAZOLAM 0.25 MG PO TABS
0.2500 mg | ORAL_TABLET | Freq: Once | ORAL | Status: AC
  Administered 2021-01-03: 0.25 mg via ORAL
  Filled 2021-01-03: qty 1

## 2021-01-03 MED ORDER — ACETAMINOPHEN 160 MG/5ML PO SOLN: 650.0000 mg | ORAL | Status: DC | PRN

## 2021-01-03 MED ORDER — PROPRANOLOL HCL 10 MG PO TABS
10.0000 mg | ORAL_TABLET | Freq: Three times a day (TID) | ORAL | Status: DC
Start: 2021-01-03 — End: 2021-01-04
  Administered 2021-01-03: 10 mg via ORAL
  Filled 2021-01-03 (×2): qty 1

## 2021-01-03 MED ORDER — ASPIRIN 300 MG RE SUPP: 300.0000 mg | Freq: Every day | RECTAL | Status: DC

## 2021-01-03 MED ORDER — ACETAMINOPHEN 325 MG PO TABS
650.0000 mg | ORAL_TABLET | ORAL | Status: DC | PRN
  Administered 2021-01-03: 650 mg via ORAL
  Filled 2021-01-03: qty 2

## 2021-01-03 MED ORDER — INSULIN ASPART 100 UNIT/ML ~~LOC~~ SOLN
0.0000 [IU] | Freq: Three times a day (TID) | SUBCUTANEOUS | Status: DC
  Administered 2021-01-04 (×2): 1 [IU] via SUBCUTANEOUS
  Administered 2021-01-05 (×2): 2 [IU] via SUBCUTANEOUS

## 2021-01-03 MED ORDER — HYDRALAZINE HCL 20 MG/ML IJ SOLN: 5.0000 mg | INTRAMUSCULAR | Status: DC | PRN

## 2021-01-03 MED ORDER — SODIUM CHLORIDE 0.9 % IV SOLN: INTRAVENOUS | Status: DC

## 2021-01-03 MED ORDER — STROKE: EARLY STAGES OF RECOVERY BOOK
Freq: Once | Status: AC
  Filled 2021-01-03: qty 1

## 2021-01-03 MED ORDER — ASPIRIN 325 MG PO TABS
325.0000 mg | ORAL_TABLET | Freq: Every day | ORAL | Status: DC
  Administered 2021-01-04 – 2021-01-05 (×2): 325 mg via ORAL
  Filled 2021-01-03 (×2): qty 1

## 2021-01-03 NOTE — ED Provider Notes (Signed)
[facility] EMERGENCY DEPARTMENT Provider Note   CSN: [id number] Arrival date & time: 01/03/21  1415     History Chief Complaint  Patient presents with  . Facial Droop     Kiara Blair is a 87 y.o. female.  HPI Patient is an 87 year old female with a history of diabetes mellitus, HLD, hypertension, essential tremor, who presents the emergency department due to numbness.  Patient states that she was eating dinner around 3 PM yesterday and began having numbness to the left side of her face as well as her left hand up to the middle of the left forearm.  She states that while she was eating she did not realize but she was biting her tongue as well as her left lower lip.  She went to the bathroom after eating and realized that her lip was swollen and mildly bleeding.  She states that a few hours later her symptoms resolved and she went to bed around 9 PM.  She woke up this morning and still had swelling to her left lower lip and discussed her symptoms with her son who brought her into the emergency department.  He states she is not anticoagulated.  No history of CVA or TIA.  No history of MI.  Patient has no other complaints at this time.  Her son also notes that she has chronic pain in her bilateral shoulders.  He states she had a fall about 2 weeks ago and has been experiencing acute on chronic right shoulder pain since then.  Her pain worsens with movement of the arm.    Past Medical History:  Diagnosis Date  . Diabetes mellitus without complication (HCC)   . Hypercholesteremia   . Hypertension   . Tremor    right arm    Patient Active Problem List   Diagnosis Date Noted  . Paresthesia 05/01/2020    Past Surgical History:  Procedure Laterality Date  . ABDOMINAL HYSTERECTOMY    . APPENDECTOMY    . BLADDER SUSPENSION    . CHOLECYSTECTOMY    . EYE SURGERY    . TONSILLECTOMY       OB History   No obstetric history on file.     History reviewed. No pertinent  family history.  Social History   Tobacco Use  . Smoking status: Never Smoker  . Smokeless tobacco: Never Used  Vaping Use  . Vaping Use: Never used  Substance Use Topics  . Alcohol use: No  . Drug use: No    Home Medications Prior to Admission medications   Medication Sig Start Date End Date Taking? Authorizing Provider  amLODipine-benazepril (LOTREL) 5-20 MG per capsule Take 1 capsule by mouth daily.   Yes [provider]  KAZANO 12.5-500 MG TABS Take 1 tablet by mouth 2 (two) times daily. 04/21/20  Yes [provider]  Multiple Vitamin (MULTIVITAMIN WITH MINERALS) TABS tablet Take 1 tablet by mouth daily. Dr. Linton FlemingsWhitakers Vitamins.   Yes [provider]  propranolol (INDERAL) 10 MG tablet Take 10 mg by mouth 3 (three) times daily.   Yes [provider]  benzonatate (TESSALON) 100 MG capsule Take 1 capsule (100 mg total) by mouth every 8 (eight) hours. 12/31/16   Melene PlanFloyd, Dan, DO  diclofenac sodium (VOLTAREN) 1 % GEL Apply 2 g topically 2 (two) times daily as needed. 07/13/17   Charlynne PanderYao, David Hsienta, MD  glyBURIDE-metformin (GLUCOVANCE) 2.5-500 MG tablet Take 1 tablet by mouth.    [provider]  glyBURIDE-metformin (GLUCOVANCE) 5-500  MG per tablet Take 1 tablet by mouth daily with breakfast.    [provider]  lidocaine (LIDODERM) 5 % Place 1 patch onto the skin daily. Remove & Discard patch within 12 hours or as directed by MD 08/27/18   Long, Arlyss Repress, MD  meclizine (ANTIVERT) 25 MG tablet Take 1 tablet (25 mg total) by mouth 3 (three) times daily as needed for dizziness. 02/01/16   Geoffery Lyons, MD  meclizine (ANTIVERT) 25 MG tablet Take 1 tablet (25 mg total) by mouth 3 (three) times daily as needed for dizziness. 01/08/17   Molpus, John, MD  PROTEIN PO Take 1 each by mouth daily. Premium Protein shake.    [provider]  sitaGLIPtin-metformin (JANUMET) 50-500 MG tablet Take 1 tablet by mouth 2 (two) times daily with a meal.     [provider]    Allergies    Doxycycline hyclate, Levofloxacin, Meloxicam, Naproxen-esomeprazole, Nitrofurantoin, Other, and Pneumococcal polysaccharide vaccine  Review of Systems   Review of Systems  All other systems reviewed and are negative. Ten systems reviewed and are negative for acute change, except as noted in the HPI.   Physical Exam Updated Vital Signs BP (!) 143/63   Pulse 68   Temp 98.1 F (36.7 C) (Oral)   Resp 17   Ht 5' (1.524 m)   Wt 43.1 kg   SpO2 99%   BMI 18.55 kg/m   Physical Exam Vitals and nursing note reviewed.  Constitutional:      General: She is not in acute distress.    Appearance: Normal appearance. She is normal weight. She is not ill-appearing, toxic-appearing or diaphoretic.  HENT:     Head: Normocephalic and atraumatic.     Right Ear: External ear normal.     Left Ear: External ear normal.     Nose: Nose normal.     Mouth/Throat:     Mouth: Mucous membranes are moist.     Pharynx: Oropharynx is clear. No oropharyngeal exudate or posterior oropharyngeal erythema.     Comments: Mild swelling noted to the left lower lip. Eyes:     Extraocular Movements: Extraocular movements intact.  Cardiovascular:     Rate and Rhythm: Normal rate and regular rhythm.     Pulses: Normal pulses.     Heart sounds: Normal heart sounds. No murmur heard. No friction rub. No gallop.   Pulmonary:     Effort: Pulmonary effort is normal. No respiratory distress.     Breath sounds: Normal breath sounds. No stridor. No wheezing, rhonchi or rales.  Abdominal:     General: Abdomen is flat.     Tenderness: There is no abdominal tenderness.  Musculoskeletal:        General: Normal range of motion.     Cervical back: Normal range of motion and neck supple. No tenderness.     Comments: Pain noted with ROM of the right arm. Mild TTP noted with palpation of the right anterior shoulder.  Skin:    General: Skin is warm and dry.  Neurological:     General:  No focal deficit present.     Mental Status: She is alert and oriented to person, place, and time.     Comments: Patient is oriented to person, place, and time. Patient phonates in clear, complete, and coherent sentences.  No slurring.  Mild facial droop noted on the left side when asking patient to smile.  No involvement around the eyes or forehead.  Patient able to  open and close eyes completely. Strength is 5/5 in all four extremities. Distal sensation intact in all four extremities.  Psychiatric:        Mood and Affect: Mood normal.        Behavior: Behavior normal.    ED Results / Procedures / Treatments   Labs (all labs ordered are listed, but only abnormal results are displayed) Labs Reviewed  COMPREHENSIVE METABOLIC PANEL - Abnormal; Notable for the following components:      Result Value   Glucose, Bld 145 (*)    BUN 32 (*)    Total Bilirubin 0.2 (*)    All other components within normal limits  URINALYSIS, ROUTINE W REFLEX MICROSCOPIC - Abnormal; Notable for the following components:   Leukocytes,Ua TRACE (*)    All other components within normal limits  URINALYSIS, MICROSCOPIC (REFLEX) - Abnormal; Notable for the following components:   Bacteria, UA RARE (*)    All other components within normal limits  RESP PANEL BY RT-PCR (FLU A&B, COVID) ARPGX2  RAPID URINE DRUG SCREEN, HOSP PERFORMED  CBC  PROTIME-INR  TROPONIN I (HIGH SENSITIVITY)  TROPONIN I (HIGH SENSITIVITY)    EKG EKG Interpretation  Date/Time:  Monday January 03 2021 15:18:28 EDT Ventricular Rate:  76 PR Interval:  130 QRS Duration: 106 QT Interval:  395 QTC Calculation: 445 R Axis:   79 Text Interpretation: Sinus rhythm Borderline ST elevation, anterior leads No significant change since last tracing Confirmed by Meridee Score [phone]) on 01/03/2021 3:25:50 PM   Radiology DG Chest 2 View  Result Date: 01/03/2021 CLINICAL DATA:  Facial droop EXAM: CHEST - 2 VIEW COMPARISON:  08/27/2018 FINDINGS: The  heart size and mediastinal contours are within normal limits. Both lungs are clear. Disc degenerative disease thoracic spine. IMPRESSION: No acute abnormality of the lungs. Electronically Signed   By: Lauralyn Primes M.D.   On: 01/03/2021 16:34   DG Shoulder Right  Result Date: 01/03/2021 CLINICAL DATA:  87 year old female with right shoulder pain after fall 1 week ago. EXAM: RIGHT SHOULDER - 2+ VIEW COMPARISON:  09/02/2019 FINDINGS: No acute fracture. Similar appearing superior subluxation of the humeral head relative to the glenoid fossa. Mild periarticular osteophyte formation and subchondral sclerosis, similar to 2020 comparison. Soft tissues are unremarkable. IMPRESSION: 1. No acute fracture or malalignment. 2. Similar appearing chronic superior subluxation of the humeral head relative to the glenoid fossa as could be seen with chronic rotator cuff abnormality. Electronically Signed   By: Marliss Coots MD   On: 01/03/2021 16:34   CT HEAD WO CONTRAST  Result Date: 01/03/2021 CLINICAL DATA:  Left face and arm numbness yesterday. EXAM: CT HEAD WITHOUT CONTRAST TECHNIQUE: Contiguous axial images were obtained from the base of the skull through the vertex without intravenous contrast. COMPARISON:  MRI brain and CT head dated May 01, 2020. FINDINGS: Brain: No evidence of acute infarction, hemorrhage, hydrocephalus, extra-axial collection or mass lesion/mass effect. Stable mild atrophy and chronic microvascular ischemic changes. Vascular: Calcified atherosclerosis at the skullbase. No hyperdense vessel. Skull: Normal. Negative for fracture or focal lesion. Sinuses/Orbits: No acute finding. Other: None. IMPRESSION: 1. No acute intracranial abnormality. 2. Stable mild atrophy and chronic microvascular ischemic changes. Electronically Signed   By: Obie Dredge M.D.   On: 01/03/2021 16:24   DG Shoulder Left  Result Date: 01/03/2021 CLINICAL DATA:  BILATERAL shoulder pain post fall 1 week ago EXAM: LEFT  SHOULDER - 2+ VIEW COMPARISON:  04/26/2018 FINDINGS: Osseous demineralization. Visualized LEFT ribs intact. LEFT  AC joint degenerative changes with inferior acromial spur formation. Glenohumeral joint space narrowing with spur formation and subchondral sclerosis, including bone-on-bone appearance on the axillary view. No acute fracture, dislocation, or bone destruction. Atherosclerotic calcifications aorta. IMPRESSION: Degenerative changes of LEFT glenohumeral and LEFT acromioclavicular joints. No acute abnormalities. Aortic Atherosclerosis (ICD10-I70.0). Electronically Signed   By: Ulyses Southward M.D.   On: 01/03/2021 16:37    Procedures Procedures   Medications Ordered in ED Medications - No data to display  ED Course  I have reviewed the triage vital signs and the nursing notes.  Pertinent labs & imaging results that were available during my care of the patient were reviewed by me and considered in my medical decision making (see chart for details).  Clinical Course as of 01/03/21 1730  Mon Jan 03, 2021  4734 87 year old female brought in by her son for an episode yesterday of some left face and left arm numbness.  She says this is different from another numbness that she gets from time to time.  She has no definite deficits at this moment.  Getting labs head CT and will need to reach out to neurology. [MB]  1636 CT HEAD WO CONTRAST No acute intracranial abnormalities. [LJ]    Clinical Course User Index [LJ] Placido Sou, PA-C [MB] Terrilee Files, MD   MDM Rules/Calculators/A&P                          Pt is a 87 y.o. female who presents the emergency department with an episode of numbness and left-sided facial droop that occurred yesterday afternoon.  Labs: CBC without abnormalities. PT/INR within normal limits. UDS negative. UA with trace leukocytes and rare bacteria. Troponin of 5. CMP with a glucose of 145, BUN of 32, total bilirubin of 0.2.  Imaging: CT scan of the  head is negative for any acute abnormalities. Chest x-ray is negative.  X-ray of the left shoulder shows degenerative changes of the left glenohumeral and left AC joints.  No acute abnormalities. X-ray of the right shoulder shows no acute fracture or malalignment.  There is similar appearing chronic superior subluxation of the humeral head relative to the glenoid fossa as could be seen with chronic rotator cuff abnormality.  I, Placido Sou, PA-C, personally reviewed and evaluated these images and lab results as part of my medical decision-making.  I spoke to Dr. Derry Lory with neurology.  Based on patient's records it appears that she had a similar episode in July of last year.  There was concern the patient had a possible partial seizure.  They recommended EEG but this was never obtained because patient did not want to stay in the hospital at that visit.   Neurology recommends admission for MRI with and without contrast.  They also recommend obtaining an EEG at this visit.  Will discuss with the medicine team at this time.  Note: Portions of this report may have been transcribed using voice recognition software. Every effort was made to ensure accuracy; however, inadvertent computerized transcription errors may be present.   Final Clinical Impression(s) / ED Diagnoses Final diagnoses:  Numbness   Rx / DC Orders ED Discharge Orders    None       Placido Sou, PA-C 01/03/21 1857    Terrilee Files, MD 01/04/21 1049

## 2021-01-03 NOTE — H&P (Signed)
History and Physical    Kiara Blair DJT:[id number] DOB: 1929-11-29 DOA: 01/03/2021  PCP: Heron Nay, PA  Patient coming from: Home.  Chief Complaint: Left facial and left upper extremity numbness.  HPI: Kiara Blair is a 87 y.o. female with history of diabetes mellitus type 2, hypertension presents to the ER with complaints of having had left facial numbness and left upper extremity numbness which happened about 3 PM on January 02 2021 days ago.  Symptoms lasted until 9 PM and by the time patient woke up in the morning symptoms have resolved.  Patient has had a fall about 2 weeks ago and since then has been hurting in the shoulders.  Denies any difficulty speaking swallowing or any visual symptoms.  During the episode patient also had bitten her lips.  ED Course: In the ER patient appears nonfocal.  CT head was unremarkable.  X-rays of the shoulder does not show anything acute except for chronic subluxation of the right shoulder.  Covid test was negative.  EKG shows normal sinus rhythm.  On-call neurologist was consulted and patient admitted for possible TIA and other etiologies including possibility of seizures.  Review of Systems: As per HPI, rest all negative.   Past Medical History:  Diagnosis Date  . Diabetes mellitus without complication (HCC)   . Hypercholesteremia   . Hypertension   . Tremor    right arm    Past Surgical History:  Procedure Laterality Date  . ABDOMINAL HYSTERECTOMY    . APPENDECTOMY    . BLADDER SUSPENSION    . CHOLECYSTECTOMY    . EYE SURGERY    . TONSILLECTOMY       reports that she has never smoked. She has never used smokeless tobacco. She reports that she does not drink alcohol and does not use drugs.  Allergies  Allergen Reactions  . Doxycycline Hyclate Nausea And Vomiting and Other (See Comments)  . Levofloxacin Nausea And Vomiting  . Meloxicam Nausea And Vomiting  . Naproxen-Esomeprazole Nausea And Vomiting  . Nitrofurantoin Nausea And  Vomiting  . Other     Pt states she is allergic to meds but does not know names  . Pneumococcal Polysaccharide Vaccine Itching    History reviewed. No pertinent family history.  Prior to Admission medications   Medication Sig Start Date End Date Taking? Authorizing Provider  amLODipine-benazepril (LOTREL) 5-20 MG per capsule Take 1 capsule by mouth daily.   Yes [provider]  KAZANO 12.5-500 MG TABS Take 1 tablet by mouth 2 (two) times daily. 04/21/20  Yes [provider]  Multiple Vitamin (MULTIVITAMIN WITH MINERALS) TABS tablet Take 1 tablet by mouth daily. Dr. Linton Flemings Vitamins.   Yes [provider]  propranolol (INDERAL) 10 MG tablet Take 10 mg by mouth 3 (three) times daily.   Yes [provider]  benzonatate (TESSALON) 100 MG capsule Take 1 capsule (100 mg total) by mouth every 8 (eight) hours. 12/31/16   Melene Plan, DO  diclofenac sodium (VOLTAREN) 1 % GEL Apply 2 g topically 2 (two) times daily as needed. 07/13/17   Charlynne Pander, MD  glyBURIDE-metformin (GLUCOVANCE) 2.5-500 MG tablet Take 1 tablet by mouth.    [provider]  glyBURIDE-metformin (GLUCOVANCE) 5-500 MG per tablet Take 1 tablet by mouth daily with breakfast.    [provider]  lidocaine (LIDODERM) 5 % Place 1 patch onto the skin daily. Remove & Discard patch within 12 hours or as directed by MD 08/27/18  Long, Arlyss Repress, MD  meclizine (ANTIVERT) 25 MG tablet Take 1 tablet (25 mg total) by mouth 3 (three) times daily as needed for dizziness. 02/01/16   Geoffery Lyons, MD  meclizine (ANTIVERT) 25 MG tablet Take 1 tablet (25 mg total) by mouth 3 (three) times daily as needed for dizziness. 01/08/17   Molpus, John, MD  PROTEIN PO Take 1 each by mouth daily. Premium Protein shake.    [provider]  sitaGLIPtin-metformin (JANUMET) 50-500 MG tablet Take 1 tablet by mouth 2 (two) times daily with a meal.    [provider]    Physical  Exam: Constitutional: Moderately built and nourished. Vitals:   01/03/21 1900 01/03/21 1930 01/03/21 2100 01/03/21 2219  BP: (!) 145/63 (!) 162/66 (!) 145/82 (!) 144/61  Pulse: 61 63  64  Resp: 20 (!) 22 18 17   Temp:    98.2 F (36.8 C)  TempSrc:    Oral  SpO2: 95% 96% 98% 98%  Weight:      Height:       Eyes: Anicteric no pallor. ENMT: No discharge from the ears eyes nose or mouth. Neck: No mass felt.  No neck rigidity. Respiratory: No rhonchi or crepitations. Cardiovascular: S1-S2 heard. Abdomen: Soft nontender bowel sounds present. Musculoskeletal: No edema. Skin: No rash. Neurologic: Alert awake oriented to time place and person.  Moves all extremities 5 x 5.  No facial asymmetry tongue is midline pupils equal and reactive to light. Psychiatric: Appears normal.  Normal affect.   Labs on Admission: I have personally reviewed following labs and imaging studies  CBC: Recent Labs  Lab 01/03/21 1533  WBC 9.6  HGB 14.3  HCT 42.8  MCV 87.7  PLT 270   Basic Metabolic Panel: Recent Labs  Lab 01/03/21 1533  NA 135  K 3.8  CL 102  CO2 24  GLUCOSE 145*  BUN 32*  CREATININE 0.62  CALCIUM 9.9   GFR: Estimated Creatinine Clearance: 33.1 mL/min (by C-G formula based on SCr of 0.62 mg/dL). Liver Function Tests: Recent Labs  Lab 01/03/21 1533  AST 23  ALT 20  ALKPHOS 95  BILITOT 0.2*  PROT 6.8  ALBUMIN 3.9   No results for input(s): LIPASE, AMYLASE in the last 168 hours. No results for input(s): AMMONIA in the last 168 hours. Coagulation Profile: Recent Labs  Lab 01/03/21 1533  INR 0.9   Cardiac Enzymes: No results for input(s): CKTOTAL, CKMB, CKMBINDEX, TROPONINI in the last 168 hours. BNP (last 3 results) No results for input(s): PROBNP in the last 8760 hours. HbA1C: No results for input(s): HGBA1C in the last 72 hours. CBG: No results for input(s): GLUCAP in the last 168 hours. Lipid Profile: No results for input(s): CHOL, HDL, LDLCALC, TRIG,  CHOLHDL, LDLDIRECT in the last 72 hours. Thyroid Function Tests: No results for input(s): TSH, T4TOTAL, FREET4, T3FREE, THYROIDAB in the last 72 hours. Anemia Panel: No results for input(s): VITAMINB12, FOLATE, FERRITIN, TIBC, IRON, RETICCTPCT in the last 72 hours. Urine analysis:    Component Value Date/Time   COLORURINE YELLOW 01/03/2021 1533   APPEARANCEUR CLEAR 01/03/2021 1533   LABSPEC 1.010 01/03/2021 1533   PHURINE 5.0 01/03/2021 1533   GLUCOSEU NEGATIVE 01/03/2021 1533   HGBUR NEGATIVE 01/03/2021 1533   BILIRUBINUR NEGATIVE 01/03/2021 1533   KETONESUR NEGATIVE 01/03/2021 1533   PROTEINUR NEGATIVE 01/03/2021 1533   NITRITE NEGATIVE 01/03/2021 1533   LEUKOCYTESUR TRACE (A) 01/03/2021 1533   Sepsis Labs: @LABRCNTIP (procalcitonin:4,lacticidven:4) ) Recent Results (from the past  240 hour(s))  Resp Panel by RT-PCR (Flu A&B, Covid) Nasopharyngeal Swab     Status: None   Collection Time: 01/03/21  5:27 PM   Specimen: Nasopharyngeal Swab; Nasopharyngeal(NP) swabs in vial transport medium  Result Value Ref Range Status   SARS Coronavirus 2 by RT PCR NEGATIVE NEGATIVE Final    Comment: (NOTE) SARS-CoV-2 target nucleic acids are NOT DETECTED.  The SARS-CoV-2 RNA is generally detectable in upper respiratory specimens during the acute phase of infection. The lowest concentration of SARS-CoV-2 viral copies this assay can detect is 138 copies/mL. A negative result does not preclude SARS-Cov-2 infection and should not be used as the sole basis for treatment or other patient management decisions. A negative result may occur with  improper specimen collection/handling, submission of specimen other than nasopharyngeal swab, presence of viral mutation(s) within the areas targeted by this assay, and inadequate number of viral copies(<138 copies/mL). A negative result must be combined with clinical observations, patient history, and epidemiological information. The expected result is  Negative.  Fact Sheet for Patients:  BloggerCourse.com  Fact Sheet for Healthcare Providers:  SeriousBroker.it  This test is no t yet approved or cleared by the [place] FDA and  has been authorized for detection and/or diagnosis of SARS-CoV-2 by FDA under an Emergency Use Authorization (EUA). This EUA will remain  in effect (meaning this test can be used) for the duration of the COVID-19 declaration under Section 564(b)(1) of the Act, 21 U.S.C.section 360bbb-3(b)(1), unless the authorization is terminated  or revoked sooner.       Influenza A by PCR NEGATIVE NEGATIVE Final   Influenza B by PCR NEGATIVE NEGATIVE Final    Comment: (NOTE) The Xpert Xpress SARS-CoV-2/FLU/RSV plus assay is intended as an aid in the diagnosis of influenza from Nasopharyngeal swab specimens and should not be used as a sole basis for treatment. Nasal washings and aspirates are unacceptable for Xpert Xpress SARS-CoV-2/FLU/RSV testing.  Fact Sheet for Patients: BloggerCourse.com  Fact Sheet for Healthcare Providers: SeriousBroker.it  This test is not yet approved or cleared by the [place] FDA and has been authorized for detection and/or diagnosis of SARS-CoV-2 by FDA under an Emergency Use Authorization (EUA). This EUA will remain in effect (meaning this test can be used) for the duration of the COVID-19 declaration under Section 564(b)(1) of the Act, 21 U.S.C. section 360bbb-3(b)(1), unless the authorization is terminated or revoked.  Performed at [facility], [address] [place], [place] [address]      Radiological Exams on Admission: DG Chest 2 View  Result Date: 01/03/2021 CLINICAL DATA:  Facial droop EXAM: CHEST - 2 VIEW COMPARISON:  08/27/2018 FINDINGS: The heart size and mediastinal contours are within normal limits. Both lungs are clear. Disc degenerative disease  thoracic spine. IMPRESSION: No acute abnormality of the lungs. Electronically Signed   By: Lauralyn Primes M.D.   On: 01/03/2021 16:34   DG Shoulder Right  Result Date: 01/03/2021 CLINICAL DATA:  87 year old female with right shoulder pain after fall 1 week ago. EXAM: RIGHT SHOULDER - 2+ VIEW COMPARISON:  09/02/2019 FINDINGS: No acute fracture. Similar appearing superior subluxation of the humeral head relative to the glenoid fossa. Mild periarticular osteophyte formation and subchondral sclerosis, similar to 2020 comparison. Soft tissues are unremarkable. IMPRESSION: 1. No acute fracture or malalignment. 2. Similar appearing chronic superior subluxation of the humeral head relative to the glenoid fossa as could be seen with chronic rotator cuff abnormality. Electronically Signed   By:  Marliss Cootsylan  Suttle MD   On: 01/03/2021 16:34   CT HEAD WO CONTRAST  Result Date: 01/03/2021 CLINICAL DATA:  Left face and arm numbness yesterday. EXAM: CT HEAD WITHOUT CONTRAST TECHNIQUE: Contiguous axial images were obtained from the base of the skull through the vertex without intravenous contrast. COMPARISON:  MRI brain and CT head dated May 01, 2020. FINDINGS: Brain: No evidence of acute infarction, hemorrhage, hydrocephalus, extra-axial collection or mass lesion/mass effect. Stable mild atrophy and chronic microvascular ischemic changes. Vascular: Calcified atherosclerosis at the skullbase. No hyperdense vessel. Skull: Normal. Negative for fracture or focal lesion. Sinuses/Orbits: No acute finding. Other: None. IMPRESSION: 1. No acute intracranial abnormality. 2. Stable mild atrophy and chronic microvascular ischemic changes. Electronically Signed   By: Obie DredgeWilliam T Derry M.D.   On: 01/03/2021 16:24   DG Shoulder Left  Result Date: 01/03/2021 CLINICAL DATA:  BILATERAL shoulder pain post fall 1 week ago EXAM: LEFT SHOULDER - 2+ VIEW COMPARISON:  04/26/2018 FINDINGS: Osseous demineralization. Visualized LEFT ribs intact. LEFT AC  joint degenerative changes with inferior acromial spur formation. Glenohumeral joint space narrowing with spur formation and subchondral sclerosis, including bone-on-bone appearance on the axillary view. No acute fracture, dislocation, or bone destruction. Atherosclerotic calcifications aorta. IMPRESSION: Degenerative changes of LEFT glenohumeral and LEFT acromioclavicular joints. No acute abnormalities. Aortic Atherosclerosis (ICD10-I70.0). Electronically Signed   By: Ulyses SouthwardMark  Boles M.D.   On: 01/03/2021 16:37    EKG: Independently reviewed.  Normal sinus rhythm.  Assessment/Plan Principal Problem:   TIA (transient ischemic attack) Active Problems:   Essential hypertension   Diabetes mellitus type 2 in nonobese (HCC)    1. Possible TIA -discussed with neurologist Dr. Amada JupiterKirkpatrick at this time MRI brain MRA of the head and neck has been ordered.  Other etiologies include possible seizures for which EEG has been ordered.  Patient did pass stroke swallow.  We will keep patient on neurochecks.  Check 2D echo.  Aspirin.  Physical therapy consult.  Check hemoglobin A1c lipid panel. 2. Hypertension we will allow for permissive hypertension we will keep patient on as needed IV hydralazine for systolic more than 220. 3. 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 4. Diabetes mellitus type 2 we will keep patient on sliding scale coverage.   DVT prophylaxis: Lovenox. Code Status: Full code. Family Communication: Discussed with patient. Disposition Plan: Home. Consults called: Neurologist. Admission status: Observation.   Eduard ClosArshad N Devontre Siedschlag MD [facility] Pager [phone].  If 7PM-7AM, please contact night-coverage [email or web address] Password [email or web address]  01/03/2021, 10:56 PM

## 2021-01-03 NOTE — Consult Note (Signed)
Neurology Consultation Reason for Consult: Paresthesia Referring Physician: Toniann Fail, a  CC: Left-sided numbness  History is obtained from: Patient  HPI: Kiara Blair is a 87 y.o. female with a history of hypertension, hyperlipidemia, diabetes who presents with left-sided numbness involving the face and arm that lasted for a few minutes.  It started yesterday around 3 PM, and she states it lasted for less than an hour.  She states that she has never had anything like this happen before, she has had her left shoulder go numb and has frequent problems with pain in her left shoulder.  She has complained of this in the past and was evaluated for this teleneurology in July of last year, however, in the time was noted that she does have some facial involvement as well back in July of last year.    LKW: 3/28 3 PM tpa given?: no, outside of window    ROS: A 14 point ROS was performed and is negative except as noted in the HPI.   Past Medical History:  Diagnosis Date  . Diabetes mellitus without complication (HCC)   . Hypercholesteremia   . Hypertension   . Tremor    right arm     History reviewed. No pertinent family history.   Social History:  reports that she has never smoked. She has never used smokeless tobacco. She reports that she does not drink alcohol and does not use drugs.   Exam: Current vital signs: BP (!) 144/61 (BP Location: Left Arm)   Pulse 64   Temp 98.2 F (36.8 C) (Oral)   Resp 17   Ht 5' (1.524 m)   Wt 43.1 kg   SpO2 98%   BMI 18.55 kg/m  Vital signs in last 24 hours: Temp:  [98.1 F (36.7 C)-98.2 F (36.8 C)] 98.2 F (36.8 C) (03/28 2219) Pulse Rate:  [61-76] 64 (03/28 2219) Resp:  [17-22] 17 (03/28 2219) BP: (141-174)/(53-100) 144/61 (03/28 2219) SpO2:  [95 %-100 %] 98 % (03/28 2219) Weight:  [43.1 kg] 43.1 kg (03/28 1433)   Physical Exam  Constitutional: Appears well-developed and well-nourished.  Psych: Affect appropriate to  situation Eyes: No scleral injection HENT: No OP obstruction MSK: no joint deformities.  Cardiovascular: Normal rate and regular rhythm.  Respiratory: Effort normal, non-labored breathing GI: Soft.  No distension. There is no tenderness.  Skin: WDI  Neuro: Mental Status: Patient is awake, alert, oriented to person, place, month, year, and situation. Patient is able to give a clear and coherent history. No signs of aphasia or neglect Cranial Nerves: II: Visual Fields are full. Pupils are equal, round, and reactive to light.   III,IV, VI: EOMI without ptosis or diploplia.  V: Facial sensation is slightly diminished on the left.  VII: Facial movement with ? Mild left facial weakness.  VIII: hearing is intact to voice X: Uvula elevates symmetrically XI: Shoulder shrug is symmetric. XII: tongue is midline without atrophy or fasciculations.  Motor: Tone is normal. Bulk is normal. 5/5 strength was present in all four extremities, though shoulder movement is limited on the lef tdue ot pain.  Sensory: Sensation is symmetric to light touch and temperature in the arms and legs. Cerebellar: Intentional tremor without ataxia bilaterally.    I have reviewed labs in epic and the results pertinent to this consultation are: Cr 0.62  I have reviewed the images obtained: CT head - negative  Impression: 87 year old female with transient left-sided numbness.  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  If MRI is again negative, may need to consider other etiologies.  With recurrent stereotyped spells, seizure would be a consideration, though I would consider this less likely at this point.  Recommendations: 1) MRI brain, MRA head and neck 2) A1c, lipid panel, echo 3) daily aspirin 4) EEG 5) neurology will follow  Ritta Slot, MD [facility] [phone]  If 7pm- 7am, please page neurology on call as listed in AMION.

## 2021-01-03 NOTE — ED Triage Notes (Signed)
Pt noticed at approx 1500 yesterday 3/27 that her L side of her face was drooped (Pt says she caught herself chewing on her lip during dinner).  Pt also c/o L facial numbess radiating to L arm.  Fall forward onto face from standing 3/15 with no obvious injury.  Pt went to sleep yesterday with sxs and woke up this morning without any symptoms (lip swelling from trauma still present).  Denies any slurred speech, CP, or SOB at this time.

## 2021-01-04 ENCOUNTER — Observation Stay (HOSPITAL_COMMUNITY): Payer: Medicare Other

## 2021-01-04 DIAGNOSIS — I10 Essential (primary) hypertension: Secondary | ICD-10-CM | POA: Diagnosis present

## 2021-01-04 DIAGNOSIS — E1165 Type 2 diabetes mellitus with hyperglycemia: Secondary | ICD-10-CM | POA: Diagnosis present

## 2021-01-04 DIAGNOSIS — Z9181 History of falling: Secondary | ICD-10-CM | POA: Diagnosis not present

## 2021-01-04 DIAGNOSIS — G250 Essential tremor: Secondary | ICD-10-CM | POA: Diagnosis present

## 2021-01-04 DIAGNOSIS — E785 Hyperlipidemia, unspecified: Secondary | ICD-10-CM | POA: Diagnosis present

## 2021-01-04 DIAGNOSIS — R001 Bradycardia, unspecified: Secondary | ICD-10-CM | POA: Diagnosis present

## 2021-01-04 DIAGNOSIS — M24411 Recurrent dislocation, right shoulder: Secondary | ICD-10-CM | POA: Diagnosis present

## 2021-01-04 DIAGNOSIS — G459 Transient cerebral ischemic attack, unspecified: Secondary | ICD-10-CM | POA: Diagnosis present

## 2021-01-04 DIAGNOSIS — Z8673 Personal history of transient ischemic attack (TIA), and cerebral infarction without residual deficits: Secondary | ICD-10-CM | POA: Diagnosis present

## 2021-01-04 DIAGNOSIS — R197 Diarrhea, unspecified: Secondary | ICD-10-CM | POA: Diagnosis present

## 2021-01-04 DIAGNOSIS — R200 Anesthesia of skin: Secondary | ICD-10-CM | POA: Diagnosis present

## 2021-01-04 DIAGNOSIS — Z20822 Contact with and (suspected) exposure to covid-19: Secondary | ICD-10-CM | POA: Diagnosis present

## 2021-01-04 DIAGNOSIS — Z7984 Long term (current) use of oral hypoglycemic drugs: Secondary | ICD-10-CM | POA: Diagnosis not present

## 2021-01-04 DIAGNOSIS — I639 Cerebral infarction, unspecified: Secondary | ICD-10-CM | POA: Diagnosis present

## 2021-01-04 LAB — ECHOCARDIOGRAM COMPLETE
AR max vel: 1.71 cm2
AV Area VTI: 1.94 cm2
AV Area mean vel: 1.84 cm2
AV Mean grad: 2 mmHg
AV Peak grad: 4.5 mmHg
Ao pk vel: 1.06 m/s
Area-P 1/2: 3.6 cm2
Height: 60 in
P 1/2 time: 649 msec
S' Lateral: 2.8 cm
Single Plane A4C EF: 65 %
Weight: 1520 oz

## 2021-01-04 LAB — GLUCOSE, CAPILLARY
Glucose-Capillary: 152 mg/dL — ABNORMAL HIGH (ref 70–99)
Glucose-Capillary: 168 mg/dL — ABNORMAL HIGH (ref 70–99)
Glucose-Capillary: 168 mg/dL — ABNORMAL HIGH (ref 70–99)
Glucose-Capillary: 194 mg/dL — ABNORMAL HIGH (ref 70–99)
Glucose-Capillary: 201 mg/dL — ABNORMAL HIGH (ref 70–99)
Glucose-Capillary: 266 mg/dL — ABNORMAL HIGH (ref 70–99)

## 2021-01-04 LAB — CREATININE, SERUM
Creatinine, Ser: 0.63 mg/dL (ref 0.44–1.00)
GFR, Estimated: >60 mL/min (ref >60)

## 2021-01-04 LAB — CBC
HCT: 36.2 % (ref 36.0–46.0)
Hemoglobin: 12.1 g/dL (ref 12.0–15.0)
MCH: 29 pg (ref 26.0–34.0)
MCHC: 33.4 g/dL (ref 30.0–36.0)
MCV: 86.8 fL (ref 80.0–100.0)
Platelets: 223 10*3/uL (ref 150–400)
RBC: 4.17 MIL/uL (ref 3.87–5.11)
RDW: 14.3 % (ref 11.5–15.5)
WBC: 7.5 10*3/uL (ref 4.0–10.5)
nRBC: 0 % (ref 0.0–0.2)

## 2021-01-04 LAB — LIPID PANEL
Cholesterol: 227 mg/dL — ABNORMAL HIGH (ref 0–200)
HDL: 93 mg/dL (ref >40)
LDL Cholesterol: 120 mg/dL — ABNORMAL HIGH (ref 0–99)
Total CHOL/HDL Ratio: 2.4 RATIO
Triglycerides: 72 mg/dL (ref <150)
VLDL: 14 mg/dL (ref 0–40)

## 2021-01-04 LAB — HEMOGLOBIN A1C
Hgb A1c MFr Bld: 8.6 % — ABNORMAL HIGH (ref 4.8–5.6)
Mean Plasma Glucose: 200.12 mg/dL

## 2021-01-04 MED ORDER — ATORVASTATIN CALCIUM 40 MG PO TABS
40.0000 mg | ORAL_TABLET | Freq: Every day | ORAL | Status: DC
  Administered 2021-01-04 – 2021-01-05 (×2): 40 mg via ORAL
  Filled 2021-01-04: qty 1

## 2021-01-04 MED ORDER — ALPRAZOLAM 0.25 MG PO TABS
0.2500 mg | ORAL_TABLET | Freq: Once | ORAL | Status: AC
  Administered 2021-01-05: 0.25 mg via ORAL
  Filled 2021-01-04: qty 1

## 2021-01-04 MED ORDER — LORAZEPAM 2 MG/ML IJ SOLN
0.2500 mg | Freq: Once | INTRAMUSCULAR | Status: AC
  Administered 2021-01-04: 0.25 mg via INTRAVENOUS
  Filled 2021-01-04: qty 1

## 2021-01-04 MED ORDER — PROPRANOLOL HCL 10 MG PO TABS
5.0000 mg | ORAL_TABLET | Freq: Two times a day (BID) | ORAL | Status: DC
  Administered 2021-01-04 – 2021-01-05 (×2): 5 mg via ORAL
  Filled 2021-01-04 (×3): qty 0.5

## 2021-01-04 MED ORDER — LOPERAMIDE HCL 2 MG PO CAPS: 2.0000 mg | ORAL_CAPSULE | ORAL | Status: DC | PRN

## 2021-01-04 NOTE — TOC Initial Note (Signed)
Transition of Care [facility]) - Initial/Assessment Note    Patient Details  Name: Kiara Blair MRN: [id number] Date of Birth: Dec 25, 1929  [facility] of [facility] CM/SW Contact:    Kermit Balo, RN Phone Number: 01/04/2021, 4:25 PM  Clinical Narrative:                 Patient lives at home alone but family checks on her at least 4 times a week.  Pt states she has DME at home from her late spouse.  Pt denies issues with home medications (other than she takes them how she wants to). CM sent MD a message about one of the meds she has decided she no longer wants to take. She denies issues with transportation. She is active with [facility] for [facility] PT and aide. She has not seen the PT yet but the aide has been out once.  Pt wants to continue with the current [facility] agency. Family is also looking at having some private caregivers help out at home.  TOC following.  Expected Discharge Plan: Home w Home Health Services Barriers to Discharge: Continued Medical Work up   Patient Goals and CMS Choice   CMS Medicare.gov Compare Post Acute Care list provided to:: Patient Choice offered to / list presented to : Patient  Expected Discharge Plan and Services Expected Discharge Plan: Home w Home Health Services   Discharge Planning Services: CM Consult Post Acute Care Choice: Home Health Living arrangements for the past 2 months: Single Family Home                           HH Arranged: PT,Nurse's Aide HH Agency: [facility] Date HH Agency Contacted: 01/04/21   Representative spoke with at [facility] Agency: Pearson Grippe  Prior Living Arrangements/Services Living arrangements for the past 2 months: Single Family Home Lives with:: Self Patient language and need for interpreter reviewed:: Yes Do you feel safe going back to the place where you live?: Yes      Need for Family Participation in Patient Care: Yes (Comment) (intermittent) Care giver support system in place?: Yes (comment)  (intermittent) Current home services: DME,Homehealth aide,Home PT Criminal Activity/Legal Involvement Pertinent to Current Situation/Hospitalization: No - Comment as needed  Activities of Daily Living      Permission Sought/Granted                  Emotional Assessment Appearance:: Appears stated age Attitude/Demeanor/Rapport: Engaged Affect (typically observed): Accepting Orientation: : Oriented to Self,Oriented to Place,Oriented to  Time,Oriented to Situation   Psych Involvement: No (comment)  Admission diagnosis:  TIA (transient ischemic attack) [G45.9] Numbness [R20.0] Ischemic cerebrovascular accident (CVA) [facility]) [I63.9] Patient Active Problem List   Diagnosis Date Noted  . Ischemic cerebrovascular accident (CVA) (HCC) 01/04/2021  . TIA (transient ischemic attack) 01/03/2021  . Essential hypertension 01/03/2021  . Diabetes mellitus type 2 in nonobese (HCC) 01/03/2021  . Paresthesia 05/01/2020   PCP:  Heron Nay, PA Pharmacy:   [id number] [place] [place] - [address] [place] [address] Phone: [phone] Fax: [phone]     Social Determinants of Health (SDOH) Interventions    Readmission Risk Interventions No flowsheet data found.

## 2021-01-04 NOTE — Progress Notes (Signed)
EEG complete - results pending 

## 2021-01-04 NOTE — Progress Notes (Signed)
RN called down from MRI as pt could not stay still for procedure. MD notified  0.25mg  ativan given  RN down in MRI with pt.

## 2021-01-04 NOTE — Progress Notes (Signed)
STROKE TEAM PROGRESS NOTE   INTERVAL HISTORY No acute events since arrival  Presented with transient episode of left facial and arm numbness lasting less than one hour.   EEG is underway during our rounds.  Has never taken ASA. Willing to take it.  Lives alone. Has monitoring pendant system.  PT cleared her for home health PT with intermittent supervision.  We discussed plan of care, work up and findings. Questions were addressed. Son at bedside.  Vitals:   01/03/21 2351 01/04/21 0235 01/04/21 0403 01/04/21 0600  BP: (!) 177/66 (!) 127/55 (!) 133/59 (!) 136/57  Pulse: (!) 59 (!) 51 (!) 52   Resp: 18 (!) 48 16 16  Temp: 97.9 F (36.6 C) 97.8 F (36.6 C) 97.7 F (36.5 C) 98.4 F (36.9 C)  TempSrc: Oral Oral Oral Oral  SpO2: 97% 97% 97% 98%  Weight:      Height:       CBC:  Recent Labs  Lab 01/03/21 1533 01/04/21 0303  WBC 9.6 7.5  HGB 14.3 12.1  HCT 42.8 36.2  MCV 87.7 86.8  PLT 270 223   Basic Metabolic Panel:  Recent Labs  Lab 01/03/21 1533 01/04/21 0303  NA 135  --   K 3.8  --   CL 102  --   CO2 24  --   GLUCOSE 145*  --   BUN 32*  --   CREATININE 0.62 0.63  CALCIUM 9.9  --    Lipid Panel:  Recent Labs  Lab 01/04/21 0303  CHOL 227*  TRIG 72  HDL 93  CHOLHDL 2.4  VLDL 14  LDLCALC 161120*   HgbA1c:  Recent Labs  Lab 01/04/21 0303  HGBA1C 8.6*   Urine Drug Screen:  Recent Labs  Lab 01/03/21 1533  LABOPIA NONE DETECTED  COCAINSCRNUR NONE DETECTED  LABBENZ NONE DETECTED  AMPHETMU NONE DETECTED  THCU NONE DETECTED  LABBARB NONE DETECTED    Alcohol Level No results for input(s): ETH in the last 168 hours.  IMAGING past 24 hours DG Chest 2 View  Result Date: 01/03/2021 CLINICAL DATA:  Facial droop EXAM: CHEST - 2 VIEW COMPARISON:  08/27/2018 FINDINGS: The heart size and mediastinal contours are within normal limits. Both lungs are clear. Disc degenerative disease thoracic spine. IMPRESSION: No acute abnormality of the lungs. Electronically  Signed   By: Lauralyn PrimesAlex  Bibbey M.D.   On: 01/03/2021 16:34   DG Shoulder Right  Result Date: 01/03/2021 CLINICAL DATA:  87 year old female with right shoulder pain after fall 1 week ago. EXAM: RIGHT SHOULDER - 2+ VIEW COMPARISON:  09/02/2019 FINDINGS: No acute fracture. Similar appearing superior subluxation of the humeral head relative to the glenoid fossa. Mild periarticular osteophyte formation and subchondral sclerosis, similar to 2020 comparison. Soft tissues are unremarkable. IMPRESSION: 1. No acute fracture or malalignment. 2. Similar appearing chronic superior subluxation of the humeral head relative to the glenoid fossa as could be seen with chronic rotator cuff abnormality. Electronically Signed   By: Marliss Cootsylan  Suttle MD   On: 01/03/2021 16:34   CT HEAD WO CONTRAST  Result Date: 01/03/2021 CLINICAL DATA:  Left face and arm numbness yesterday. EXAM: CT HEAD WITHOUT CONTRAST TECHNIQUE: Contiguous axial images were obtained from the base of the skull through the vertex without intravenous contrast. COMPARISON:  MRI brain and CT head dated May 01, 2020. FINDINGS: Brain: No evidence of acute infarction, hemorrhage, hydrocephalus, extra-axial collection or mass lesion/mass effect. Stable mild atrophy and chronic microvascular ischemic changes. Vascular: Calcified  atherosclerosis at the skullbase. No hyperdense vessel. Skull: Normal. Negative for fracture or focal lesion. Sinuses/Orbits: No acute finding. Other: None. IMPRESSION: 1. No acute intracranial abnormality. 2. Stable mild atrophy and chronic microvascular ischemic changes. Electronically Signed   By: Obie Dredge M.D.   On: 01/03/2021 16:24   MR ANGIO HEAD WO CONTRAST  Result Date: 01/04/2021 CLINICAL DATA:  Left-sided numbness EXAM: MRI HEAD WITHOUT CONTRAST MRA HEAD WITHOUT CONTRAST MRA NECK WITHOUT CONTRAST TECHNIQUE: Multiplanar, multiecho pulse sequences of the brain and surrounding structures were obtained without intravenous contrast.  Angiographic images of the Circle of Willis were obtained using MRA technique without intravenous contrast. Angiographic images of the neck were obtained using MRA technique without intravenous contrast. Carotid stenosis measurements (when applicable) are obtained utilizing NASCET criteria, using the distal internal carotid diameter as the denominator. COMPARISON:  Brain MRI 05/01/2020 FINDINGS: MRI HEAD FINDINGS Brain: No acute infarct, mass effect or extra-axial collection. No acute or chronic hemorrhage. There is multifocal hyperintense T2-weighted signal within the white matter. Parenchymal volume and CSF spaces are normal. The midline structures are normal. Vascular: Major flow voids are preserved. Skull and upper cervical spine: Normal calvarium and skull base. Visualized upper cervical spine and soft tissues are normal. Sinuses/Orbits:No paranasal sinus fluid levels or advanced mucosal thickening. No mastoid or middle ear effusion. Normal orbits. MRA HEAD FINDINGS POSTERIOR CIRCULATION: --Vertebral arteries: Normal --Inferior cerebellar arteries: Normal. --Basilar artery: Normal. --Superior cerebellar arteries: Normal. --Posterior cerebral arteries: Normal. ANTERIOR CIRCULATION: --Intracranial internal carotid arteries: Normal. --Anterior cerebral arteries (ACA): Normal. --Middle cerebral arteries (MCA): Normal. ANATOMIC VARIANTS: Fetal origin of the left PCA. MRA NECK FINDINGS Limited time-of-flight images of the neck. Both internal carotid arteries are patent proximally. There is attenuated signal within the right vertebral artery but otherwise assessment is severely limited. IMPRESSION: 1. No acute intracranial abnormality. 2. Normal intracranial MRA. 3. Limited time-of-flight MRA of the neck. Patent internal carotid arteries. Electronically Signed   By: Deatra Robinson M.D.   On: 01/04/2021 02:55   MR ANGIO NECK WO CONTRAST  Result Date: 01/04/2021 CLINICAL DATA:  Left-sided numbness EXAM: MRI HEAD  WITHOUT CONTRAST MRA HEAD WITHOUT CONTRAST MRA NECK WITHOUT CONTRAST TECHNIQUE: Multiplanar, multiecho pulse sequences of the brain and surrounding structures were obtained without intravenous contrast. Angiographic images of the Circle of Willis were obtained using MRA technique without intravenous contrast. Angiographic images of the neck were obtained using MRA technique without intravenous contrast. Carotid stenosis measurements (when applicable) are obtained utilizing NASCET criteria, using the distal internal carotid diameter as the denominator. COMPARISON:  Brain MRI 05/01/2020 FINDINGS: MRI HEAD FINDINGS Brain: No acute infarct, mass effect or extra-axial collection. No acute or chronic hemorrhage. There is multifocal hyperintense T2-weighted signal within the white matter. Parenchymal volume and CSF spaces are normal. The midline structures are normal. Vascular: Major flow voids are preserved. Skull and upper cervical spine: Normal calvarium and skull base. Visualized upper cervical spine and soft tissues are normal. Sinuses/Orbits:No paranasal sinus fluid levels or advanced mucosal thickening. No mastoid or middle ear effusion. Normal orbits. MRA HEAD FINDINGS POSTERIOR CIRCULATION: --Vertebral arteries: Normal --Inferior cerebellar arteries: Normal. --Basilar artery: Normal. --Superior cerebellar arteries: Normal. --Posterior cerebral arteries: Normal. ANTERIOR CIRCULATION: --Intracranial internal carotid arteries: Normal. --Anterior cerebral arteries (ACA): Normal. --Middle cerebral arteries (MCA): Normal. ANATOMIC VARIANTS: Fetal origin of the left PCA. MRA NECK FINDINGS Limited time-of-flight images of the neck. Both internal carotid arteries are patent proximally. There is attenuated signal within the right vertebral artery but otherwise assessment  is severely limited. IMPRESSION: 1. No acute intracranial abnormality. 2. Normal intracranial MRA. 3. Limited time-of-flight MRA of the neck. Patent  internal carotid arteries. Electronically Signed   By: Deatra Robinson M.D.   On: 01/04/2021 02:55   MR BRAIN WO CONTRAST  Result Date: 01/04/2021 CLINICAL DATA:  Left-sided numbness EXAM: MRI HEAD WITHOUT CONTRAST MRA HEAD WITHOUT CONTRAST MRA NECK WITHOUT CONTRAST TECHNIQUE: Multiplanar, multiecho pulse sequences of the brain and surrounding structures were obtained without intravenous contrast. Angiographic images of the Circle of Willis were obtained using MRA technique without intravenous contrast. Angiographic images of the neck were obtained using MRA technique without intravenous contrast. Carotid stenosis measurements (when applicable) are obtained utilizing NASCET criteria, using the distal internal carotid diameter as the denominator. COMPARISON:  Brain MRI 05/01/2020 FINDINGS: MRI HEAD FINDINGS Brain: No acute infarct, mass effect or extra-axial collection. No acute or chronic hemorrhage. There is multifocal hyperintense T2-weighted signal within the white matter. Parenchymal volume and CSF spaces are normal. The midline structures are normal. Vascular: Major flow voids are preserved. Skull and upper cervical spine: Normal calvarium and skull base. Visualized upper cervical spine and soft tissues are normal. Sinuses/Orbits:No paranasal sinus fluid levels or advanced mucosal thickening. No mastoid or middle ear effusion. Normal orbits. MRA HEAD FINDINGS POSTERIOR CIRCULATION: --Vertebral arteries: Normal --Inferior cerebellar arteries: Normal. --Basilar artery: Normal. --Superior cerebellar arteries: Normal. --Posterior cerebral arteries: Normal. ANTERIOR CIRCULATION: --Intracranial internal carotid arteries: Normal. --Anterior cerebral arteries (ACA): Normal. --Middle cerebral arteries (MCA): Normal. ANATOMIC VARIANTS: Fetal origin of the left PCA. MRA NECK FINDINGS Limited time-of-flight images of the neck. Both internal carotid arteries are patent proximally. There is attenuated signal within the  right vertebral artery but otherwise assessment is severely limited. IMPRESSION: 1. No acute intracranial abnormality. 2. Normal intracranial MRA. 3. Limited time-of-flight MRA of the neck. Patent internal carotid arteries. Electronically Signed   By: Deatra Robinson M.D.   On: 01/04/2021 02:55   DG Shoulder Left  Result Date: 01/03/2021 CLINICAL DATA:  BILATERAL shoulder pain post fall 1 week ago EXAM: LEFT SHOULDER - 2+ VIEW COMPARISON:  04/26/2018 FINDINGS: Osseous demineralization. Visualized LEFT ribs intact. LEFT AC joint degenerative changes with inferior acromial spur formation. Glenohumeral joint space narrowing with spur formation and subchondral sclerosis, including bone-on-bone appearance on the axillary view. No acute fracture, dislocation, or bone destruction. Atherosclerotic calcifications aorta. IMPRESSION: Degenerative changes of LEFT glenohumeral and LEFT acromioclavicular joints. No acute abnormalities. Aortic Atherosclerosis (ICD10-I70.0). Electronically Signed   By: Ulyses Southward M.D.   On: 01/03/2021 16:37   PHYSICAL EXAM Constitutional: Appears well-developed and well-nourished elderl lady. Sitting up in bed conversing with staff in NAD.  Psych: Affect appropriate to situation Eyes: No scleral injection HENT: No OP obstruction MSK: no joint deformities.  Cardiovascular: Normal rate and regular rhythm.  Respiratory: Effort normal, non-labored breathing GI: Soft.  No distension. There is no tenderness.  Skin: WDI  Neuro: Mental Status: Patient is awake, alert, oriented to person, place, month, year, and situation. Patient is able to give a clear and coherent history. No signs of aphasia or neglect Cranial Nerves: II: Visual Fields are full. Pupils are equal, round, and reactive to light.   III,IV, VI: EOMI without ptosis or diploplia.  V: Facial sensation is slightly diminished on the left.  VII: Facial movement with ? Mild left facial weakness.  VIII: hearing is intact  to voice X: Uvula elevates symmetrically XI: Shoulder shrug is symmetric. XII: tongue is midline without atrophy or fasciculations.  Motor: Tone is normal. Bulk is normal. 5/5 strength was present in all four extremities, though shoulder movement is limited on the lef tdue ot pain.  Sensory: Sensation is symmetric to light touch and temperature in the arms and legs. Cerebellar: Intentional tremor without ataxia bilaterally.    ASSESSMENT/PLAN  Kiara Blair is a 87 y.o. female with a significant risk factors for stroke including  hypertension, hyperlipidemia, diabetes who presents with left-sided numbness involving the face and arm that lasted for a few minutes.  It started 3/27 around 3 PM, lasting less than 1 hour. Evaluated by tele stroke for similar episode July last year.   Stroke like episode, less convincing for TIA.      Code Stroke CT head: No acute abnormality.   MRI:  No acute intracranial abnormality.Fetal origin of the left PCA  MRA head and neck:  Normal intracranial MRA.  2D Echo: 60-65% EF, +LVH, Grade I diastolic dysfunction. No thrombus, wall motion abnormality or shunt found.   EEG pending  LDL 120  HgbA1c 8.6  VTE prophylaxis -     Diet   Diet heart healthy/carb modified Room service appropriate? Yes; Fluid consistency: Thin     No anticoagulant or antiplatelet prior to admission  Therapy recommendations:  OT pending/PT cleared for home with HHPT.   Disposition:  TBD-likely home  Hypertension  UnsStable  Permissive hypertension (OK if < 220/120) but gradually normalize in 5-7 days . Long-term BP goal normotensive  Hyperlipidemia  Home meds: None   LDL 120, goal < 70  Add  Lipitor 40mg    Continue statin at discharge  Diabetes type II Uncontrolled  HgbA1c 8.6, goal < 7.0  CBGs Recent Labs    01/03/21 2359 01/04/21 0631  GLUCAP 201* 152*      SSI  Other Stroke Risk Factors  Advanced Age >/= 79   Other Active  Problems    Hospital day # 0 I have personally obtained history,examined this patient, reviewed notes, independently viewed imaging studies, participated in medical decision making and plan of care.ROS completed by me personally and pertinent positives fully documented  I have made any additions or clarifications directly to the above note. Agree with note above.  She presented with transient episode of left facial and upper extremity numbness possibly a TIA the brain imaging is negative.  Recommend aspirin for stroke prevention and continue ongoing stroke work-up and check EEG for seizures.  Long discussion with patient and son at the bedside and answered questions.  Greater than 50% time during the 35-minute visit was spent in counseling and coordination of care about TIA and stroke prevention and answering questions.  Discussed with Dr. 78, MD Medical Director [facility] Pager: [phone] 01/04/2021 2:42 PM   To contact Stroke Continuity provider, please refer to 01/06/2021. After hours, contact General Neurology

## 2021-01-04 NOTE — Progress Notes (Signed)
Kiara Blair  ZOX:[id number] DOB: August 04, 1930 DOA: 01/03/2021 PCP: Heron Nay, PA    Brief Narrative:  [phone] w/ a hx of DM2, HLD, and HTN who presented to the ER with left facial numbness and left upper extremity numbness which began the afternoon prior to her presentation.  In fact the morning of her presentation she awoke to find that her symptoms had resolved.  In the ER CT head was unremarkable.  Consultants:  Neurology  Code Status: FULL CODE  Antimicrobials:  None  DVT prophylaxis: Lovenox  Subjective: Since admission the patient has developed multiple episodes of high-volume watery diarrhea.  She denies abdominal cramps fevers or chills.  She denies abdominal pain.  This is not a common occurrence for her.  She also reports intermittent episodes of feeling slightly lightheaded and weak at home.  Her telemetry monitor has captured significant sinus bradycardia.  Assessment & Plan:  Possible TIA Neurology directing evaluation - MRI and MRA head/neck with no acute abnormalities -EEG pending -aspirin initiated  Sinus bradycardia Telemetry capturing heart rates as low as 47 during this hospital stay with preserved blood pressure -likely due to use of propranolol at home for benign essential tremor -signif lower dose of beta-blocker and monitor trend - check TSH - check magnesium  Watery diarrhea No concerning symptoms for an infectious etiology -trial of Imodium and clinical monitoring  HTN Permissive hypertension to be practiced during stroke work-up  Benign essential tremor As noted above, dose of BB signif decreased due to bradycardia - may have to stop it completely   Uncontrolled DM2 with hyperglycemia A1c 8.6 -CBG since admission reasonably controlled  Uncontrolled HLD LDL 120 -Lipitor added   Family Communication:  Status is: Observation  The patient remains OBS appropriate and will d/c before 2 midnights.  Dispo: The patient is from: Home               Anticipated d/c is to: Home              Patient currently is not medically stable to d/c.   Difficult to place patient No   Objective: Blood pressure (!) 157/63, pulse (!) 52, temperature 97.6 F (36.4 C), temperature source Oral, resp. rate 20, height 5' (1.524 m), weight 43.1 kg, SpO2 97 %.  Intake/Output Summary (Last 24 hours) at 01/04/2021 0832 Last data filed at 01/04/2021 0600 Gross per 24 hour  Intake 280 ml  Output --  Net 280 ml   Filed Weights   01/03/21 1433  Weight: 43.1 kg    Examination: General: No acute respiratory distress Lungs: Clear to auscultation bilaterally without wheezes or crackles Cardiovascular: Regular rate and rhythm without murmur  Abdomen: Nontender, nondistended, soft, bowel sounds positive, no rebound, no ascites, no appreciable mass Extremities: No significant cyanosis, clubbing, or edema bilateral lower extremities  CBC: Recent Labs  Lab 01/03/21 1533 01/04/21 0303  WBC 9.6 7.5  HGB 14.3 12.1  HCT 42.8 36.2  MCV 87.7 86.8  PLT 270 223   Basic Metabolic Panel: Recent Labs  Lab 01/03/21 1533 01/04/21 0303  NA 135  --   K 3.8  --   CL 102  --   CO2 24  --   GLUCOSE 145*  --   BUN 32*  --   CREATININE 0.62 0.63  CALCIUM 9.9  --    GFR: Estimated Creatinine Clearance: 33.1 mL/min (by C-G formula based on SCr of 0.63 mg/dL).  Liver Function Tests: Recent Labs  Lab 01/03/21  1533  AST 23  ALT 20  ALKPHOS 95  BILITOT 0.2*  PROT 6.8  ALBUMIN 3.9    Coagulation Profile: Recent Labs  Lab 01/03/21 1533  INR 0.9    HbA1C: Hgb A1c MFr Bld  Date/Time Value Ref Range Status  01/04/2021 03:03 AM 8.6 (H) 4.8 - 5.6 % Final    Comment:    (NOTE) Pre diabetes:          5.7%-6.4%  Diabetes:              >6.4%  Glycemic control for   <7.0% adults with diabetes     CBG: Recent Labs  Lab 01/03/21 2359 01/04/21 0631 01/04/21 0809  GLUCAP 201* 152* 168*    Recent Results (from the past 240 hour(s))  Resp Panel  by RT-PCR (Flu A&B, Covid) Nasopharyngeal Swab     Status: None   Collection Time: 01/03/21  5:27 PM   Specimen: Nasopharyngeal Swab; Nasopharyngeal(NP) swabs in vial transport medium  Result Value Ref Range Status   SARS Coronavirus 2 by RT PCR NEGATIVE NEGATIVE Final    Comment: (NOTE) SARS-CoV-2 target nucleic acids are NOT DETECTED.  The SARS-CoV-2 RNA is generally detectable in upper respiratory specimens during the acute phase of infection. The lowest concentration of SARS-CoV-2 viral copies this assay can detect is 138 copies/mL. A negative result does not preclude SARS-Cov-2 infection and should not be used as the sole basis for treatment or other patient management decisions. A negative result may occur with  improper specimen collection/handling, submission of specimen other than nasopharyngeal swab, presence of viral mutation(s) within the areas targeted by this assay, and inadequate number of viral copies(<138 copies/mL). A negative result must be combined with clinical observations, patient history, and epidemiological information. The expected result is Negative.  Fact Sheet for Patients:  BloggerCourse.com  Fact Sheet for Healthcare Providers:  SeriousBroker.it  This test is no t yet approved or cleared by the [place] FDA and  has been authorized for detection and/or diagnosis of SARS-CoV-2 by FDA under an Emergency Use Authorization (EUA). This EUA will remain  in effect (meaning this test can be used) for the duration of the COVID-19 declaration under Section 564(b)(1) of the Act, 21 U.S.C.section 360bbb-3(b)(1), unless the authorization is terminated  or revoked sooner.       Influenza A by PCR NEGATIVE NEGATIVE Final   Influenza B by PCR NEGATIVE NEGATIVE Final    Comment: (NOTE) The Xpert Xpress SARS-CoV-2/FLU/RSV plus assay is intended as an aid in the diagnosis of influenza from Nasopharyngeal swab  specimens and should not be used as a sole basis for treatment. Nasal washings and aspirates are unacceptable for Xpert Xpress SARS-CoV-2/FLU/RSV testing.  Fact Sheet for Patients: BloggerCourse.com  Fact Sheet for Healthcare Providers: SeriousBroker.it  This test is not yet approved or cleared by the [place] FDA and has been authorized for detection and/or diagnosis of SARS-CoV-2 by FDA under an Emergency Use Authorization (EUA). This EUA will remain in effect (meaning this test can be used) for the duration of the COVID-19 declaration under Section 564(b)(1) of the Act, 21 U.S.C. section 360bbb-3(b)(1), unless the authorization is terminated or revoked.  Performed at [facility], [address] [place], [place] [address]      Scheduled Meds: . aspirin  300 mg Rectal Daily   Or  . aspirin  325 mg Oral Daily  . enoxaparin (LOVENOX) injection  30 mg Subcutaneous Daily  . insulin aspart  0-6 Units Subcutaneous TID WC  . propranolol  10 mg Oral TID   Continuous Infusions: . sodium chloride 10 mL/hr at 01/04/21 0309     LOS: 0 days   Lonia Blood, MD [facility] Office  [phone] Pager - Text Page per Loretha Stapler  If 7PM-7AM, please contact night-coverage per Amion 01/04/2021, 8:32 AM

## 2021-01-04 NOTE — Progress Notes (Signed)
Pt off unit for MRI  Xanax 0.25 given prior for anxiety and claustro phobic.

## 2021-01-04 NOTE — Evaluation (Signed)
Speech Language Pathology Evaluation Patient Details Name: Kiara Blair MRN: [id number] DOB: 31-Jul-1930 Today's Date: 01/04/2021 Time: 4431-5400 SLP Time Calculation (min) (ACUTE ONLY): 22 min  Problem List:  Patient Active Problem List   Diagnosis Date Noted  . TIA (transient ischemic attack) 01/03/2021  . Essential hypertension 01/03/2021  . Diabetes mellitus type 2 in nonobese (HCC) 01/03/2021  . Paresthesia 05/01/2020   Past Medical History:  Past Medical History:  Diagnosis Date  . Diabetes mellitus without complication (HCC)   . Hypercholesteremia   . Hypertension   . Tremor    right arm   Past Surgical History:  Past Surgical History:  Procedure Laterality Date  . ABDOMINAL HYSTERECTOMY    . APPENDECTOMY    . BLADDER SUSPENSION    . CHOLECYSTECTOMY    . EYE SURGERY    . TONSILLECTOMY     HPI:  87 y.o. female with a significant risk factors for stroke including  hypertension, hyperlipidemia, diabetes who presents with left-sided numbness involving the face and arm that lasted for a few minutes. MRI/CT head negative for acute event.   Assessment / Plan / Recommendation Clinical Impression  Pt presents with mild short-term memory difficulty per assessment (word recall and difficulty recalling 911 to call emergency).  Speech was fluent and clear without dysarthria.  Expressive and receptive language were WNL.  Verbal problem solving, divergent naming/categorization, orientation were WNL. No SLP needs are identified. Pt may benefit from more frequent supervision initially upon D/C home until safety is ensured.    SLP Assessment  SLP Recommendation/Assessment: Patient does not need any further Speech Lanaguage Pathology Services SLP Visit Diagnosis: Cognitive communication deficit (R41.841)    Follow Up Recommendations  None    Frequency and Duration           SLP Evaluation Cognition  Overall Cognitive Status: Within Functional Limits for tasks  assessed Arousal/Alertness: Awake/alert Orientation Level: Oriented X4 Attention: Selective Selective Attention: Appears intact Memory: Appears intact Awareness: Appears intact Problem Solving: Appears intact Executive Function: Reasoning Safety/Judgment: Appears intact       Comprehension  Auditory Comprehension Overall Auditory Comprehension: Appears within functional limits for tasks assessed    Expression Expression Primary Mode of Expression: Verbal Verbal Expression Overall Verbal Expression: Appears within functional limits for tasks assessed Written Expression Dominant Hand: Right   Oral / Motor  Oral Motor/Sensory Function Overall Oral Motor/Sensory Function: Within functional limits Motor Speech Overall Motor Speech: Appears within functional limits for tasks assessed   GO                    Blenda Mounts Laurice 01/04/2021, 3:22 PM   Winna Golla L. Samson Frederic, MA CCC/SLP Acute Rehabilitation Services Office number [phone] Pager [phone]

## 2021-01-04 NOTE — Evaluation (Signed)
Occupational Therapy Evaluation Patient Details Name: Kiara Blair MRN: [id number] DOB: 03/01/1930 Today's Date: 01/04/2021    History of Present Illness Pt is 87 yo female who presents with L facial and LUE numbness. CT and MRI unremarkable. Pt had a fall ~2 wks ago and has had shoulder pain since that time. PMH: DM2, HTN, RUE tremor   Clinical Impression   PTA patient independent with ADLs, mobility using RW.  Reports increased difficulty with bathing and IADLs recently, but managing.  She was admitted for above and limited by problem list below, including B UE shoulder ROM, impaired balance, generalized weakness, decreased activity tolerance, and decreased safety.  Patient currently requires min guard for transfers and mobility using RW (cueing to not forget RW after grooming at sink), min guard for LB ADLs and grooming at sink; up to min assist for UB ADLs due to shoulder ROM and tremors.  She will benefit from further OT services while admitted and after dc at [facility] level to optimize independence and safety with ADLs, mobility.     Follow Up Recommendations  Home health OT;Other (comment);Supervision - Intermittent (aide)    Equipment Recommendations  Tub/shower bench    Recommendations for Other Services       Precautions / Restrictions Precautions Precautions: Fall Precaution Comments: has fallen a few times at home Restrictions Weight Bearing Restrictions: No      Mobility Bed Mobility Overal bed mobility: Needs Assistance Bed Mobility: Supine to Sit;Sit to Supine     Supine to sit: Supervision Sit to supine: Supervision   General bed mobility comments: supervision for safety, increased time    Transfers Overall transfer level: Needs assistance Equipment used: Rolling walker (2 wheeled) Transfers: Sit to/from Stand Sit to Stand: Min guard         General transfer comment: cueing for hand placement and safety, increased time to power up and mild unsteadiness     Balance Overall balance assessment: Needs assistance;History of Falls Sitting-balance support: Feet supported;No upper extremity supported Sitting balance-Leahy Scale: Fair     Standing balance support: No upper extremity supported;During functional activity Standing balance-Leahy Scale: Poor Standing balance comment: unsteady in standing without support                           ADL either performed or assessed with clinical judgement   ADL Overall ADL's : Needs assistance/impaired     Grooming: Min guard;Standing;Wash/dry hands;Oral care   Upper Body Bathing: Minimal assistance;Sitting   Lower Body Bathing: Sit to/from stand;Min guard   Upper Body Dressing : Sitting;Minimal assistance   Lower Body Dressing: Min guard;Sit to/from stand   Toilet Transfer: Min guard;Ambulation;RW   Toileting- Architect and Hygiene: Min guard;Sit to/from stand       Functional mobility during ADLs: Min guard;Rolling walker;Cueing for safety General ADL Comments: pt limited by balance, decreased functional use of B shoulders     Vision         Perception     Praxis      Pertinent Vitals/Pain Pain Assessment: Faces Faces Pain Scale: Hurts a little bit Pain Location: B shoulders (advanced OA) Pain Descriptors / Indicators: Aching Pain Intervention(s): Limited activity within patient's tolerance;Monitored during session;Repositioned     Hand Dominance Right   Extremity/Trunk Assessment Upper Extremity Assessment Upper Extremity Assessment: Generalized weakness (decreased ROM B shoulders due to OA, essential tremors)   Lower Extremity Assessment Lower Extremity Assessment: Defer to PT evaluation  Cervical / Trunk Assessment Cervical / Trunk Assessment: Kyphotic   Communication Communication Communication: No difficulties;HOH   Cognition Arousal/Alertness: Awake/alert Behavior During Therapy: WFL for tasks assessed/performed Overall Cognitive  Status: Within Functional Limits for tasks assessed                                 General Comments: min cueing for safety and not forgetting to utilize [employer] Comments  son present and supportive    Exercises     Shoulder Instructions      Home Living Family/patient expects to be discharged to:: Private residence Living Arrangements: Alone Available Help at Discharge: Family;Available 24 hours/day Type of Home: House Home Access: Level entry     Home Layout: Multi-level Alternate Level Stairs-Number of Steps: 2 steps to bedroom and basement but she doesn't have to use it Alternate Level Stairs-Rails: Right;Left;Can reach both Bathroom Shower/Tub: Chief Strategy Officer: Standard     Home Equipment: Environmental consultant - 2 wheels;Bedside commode   Additional Comments: family lives nearby and could stay with her if needed  Lives With: Alone    Prior Functioning/Environment Level of Independence: Independent        Comments: pt still drives to familiar places. Has been having some difficulty bathing per her son (because of her B shoulder OA)        OT Problem List: Decreased strength;Decreased activity tolerance;Impaired balance (sitting and/or standing);Decreased safety awareness;Decreased knowledge of use of DME or AE;Decreased knowledge of precautions;Pain;Impaired UE functional use;Decreased range of motion      OT Treatment/Interventions: Self-care/ADL training;DME and/or AE instruction;Therapeutic activities;Patient/family education;Balance training;Therapeutic exercise    OT Goals(Current goals can be found in the care plan section) Acute Rehab OT Goals Patient Stated Goal: return home OT Goal Formulation: With patient Time For Goal Achievement: 01/18/21 Potential to Achieve Goals: Good  OT Frequency: Min 2X/week   Barriers to D/C:            Co-evaluation              AM-PAC OT "6 Clicks" Daily Activity     Outcome Measure  Help from another person eating meals?: None Help from another person taking care of personal grooming?: A Little Help from another person toileting, which includes using toliet, bedpan, or urinal?: A Little Help from another person bathing (including washing, rinsing, drying)?: A Little Help from another person to put on and taking off regular upper body clothing?: A Little Help from another person to put on and taking off regular lower body clothing?: A Little 6 Click Score: 19   End of Session Equipment Utilized During Treatment: Rolling walker Nurse Communication: Mobility status;Other (comment) (IV occluded, heart monitor)  Activity Tolerance: Patient tolerated treatment well Patient left: in bed;with call bell/phone within reach;with bed alarm set;with family/visitor present  OT Visit Diagnosis: Other abnormalities of gait and mobility (R26.89);Muscle weakness (generalized) (M62.81);Pain;History of falling (Z91.81) Pain - part of body:  (Bil shoulders)                Time: 1510-1541 OT Time Calculation (min): 31 min Charges:  OT General Charges $OT Visit: 1 Visit OT Evaluation $OT Eval Moderate Complexity: 1 Mod OT Treatments $Self Care/Home Management : 8-22 mins  Barry Brunner, OT Acute Rehabilitation Services Pager [phone] Office [phone]   Kiara Blair 01/04/2021, 4:09 PM

## 2021-01-04 NOTE — Progress Notes (Signed)
Pt arrived to the unit Via stretcher by care -Link. from [facility] ED. Transferred to bed and made comfortable in bed. Initial assessment done Pt awake alert and oriented to person ,place  Month and year.  Vs done   cardiac monitor in use and  Admitting Hospitality made aware. About pt arrival. awaiting admitting orders, Pt MAE, endorsed slight numbness to bil Upper extreme ties.  tremors also noted to  BUE and face. Denied pain at present. RN will continue to monitor.

## 2021-01-04 NOTE — Progress Notes (Signed)
Pt went down for MRI via bed.

## 2021-01-04 NOTE — Evaluation (Signed)
Physical Therapy Evaluation Patient Details Name: Kiara Blair MRN: [id number] DOB: July 07, 1930 Today's Date: 01/04/2021   History of Present Illness  Pt is 87 yo female who presents with L facial and LUE numbness. CT and MRI unremarkable. Pt had a fall ~2 wks ago and has had shoulder pain since that time. PMH: DM2, HTN, RUE tremor  Clinical Impression  Pt admitted with above diagnosis. Pt received in bed, appears to be near baseline LOF. Pt ambulated 20' with RW and min-guard A. Unsteady without UE support but no LOB with RW. Per son pt is not always compliant with RW due to carrying things in her arms and has been having difficulty with bathing and dressing. Would benefit from HHPT as well as support of HHaide. Pt desires to remain in her own home.  Pt currently with functional limitations due to the deficits listed below (see PT Problem List). Pt will benefit from skilled PT to increase their independence and safety with mobility to allow discharge to the venue listed below.       Follow Up Recommendations Home health PT;Supervision - Intermittent [facility])    Equipment Recommendations       Recommendations for Other Services       Precautions / Restrictions Precautions Precautions: Fall Precaution Comments: has fallen a few times at home Restrictions Weight Bearing Restrictions: No      Mobility  Bed Mobility Overal bed mobility: Needs Assistance Bed Mobility: Supine to Sit;Sit to Supine     Supine to sit: Supervision Sit to supine: Min assist   General bed mobility comments: pt able to come to EOB with increased time, min A to LE's for return to bed    Transfers Overall transfer level: Needs assistance Equipment used: Rolling walker (2 wheeled) Transfers: Sit to/from Stand Sit to Stand: Min guard         General transfer comment: no physical assist needed to stand, min-guard for safety as pt was mildly unsteady without UE support  Ambulation/Gait Ambulation/Gait  assistance: Min guard Gait Distance (Feet): 20 Feet Assistive device: Rolling walker (2 wheeled) Gait Pattern/deviations: Step-through pattern;Trunk flexed Gait velocity: decreased Gait velocity interpretation: <1.8 ft/sec, indicate of risk for recurrent falls General Gait Details: pt supports self with UE's on RW but reports increased shoulder discomfort because of this. Distance limited by need to get back to bed for EEG. No LOB with RW but unsteady without  Stairs            Wheelchair Mobility    Modified Rankin (Stroke Patients Only) Modified Rankin (Stroke Patients Only) Pre-Morbid Rankin Score: Slight disability Modified Rankin: Moderate disability     Balance Overall balance assessment: Needs assistance;History of Falls Sitting-balance support: Feet supported;No upper extremity supported Sitting balance-Leahy Scale: Fair     Standing balance support: No upper extremity supported;During functional activity Standing balance-Leahy Scale: Poor Standing balance comment: unsteady in standing without support                             Pertinent Vitals/Pain Pain Assessment: Faces Faces Pain Scale: Hurts a little bit Pain Location: B shoulders (advanced OA) Pain Descriptors / Indicators: Aching Pain Intervention(s): Limited activity within patient's tolerance;Monitored during session    Home Living Family/patient expects to be discharged to:: Private residence Living Arrangements: Alone Available Help at Discharge: Family;Available 24 hours/day Type of Home: House Home Access: Level entry     Home Layout: Multi-level Home Equipment: Dan Humphreys -  2 wheels;Bedside commode Additional Comments: family lives nearby and could stay with her if needed    Prior Function Level of Independence: Independent         Comments: pt still drives to familiar places. Has been having some difficulty bathing per her son (because of her B shoulder OA)     Hand  Dominance   Dominant Hand: Right    Extremity/Trunk Assessment   Upper Extremity Assessment Upper Extremity Assessment: Defer to OT evaluation    Lower Extremity Assessment Lower Extremity Assessment: Generalized weakness    Cervical / Trunk Assessment Cervical / Trunk Assessment: Kyphotic  Communication   Communication: No difficulties;HOH  Cognition Arousal/Alertness: Awake/alert Behavior During Therapy: WFL for tasks assessed/performed Overall Cognitive Status: Within Functional Limits for tasks assessed                                        General Comments General comments (skin integrity, edema, etc.): pt's son reports that she has fallen several times because of holding things in both hands and not having RW. Pt has medical alert button    Exercises     Assessment/Plan    PT Assessment Patient needs continued PT services  PT Problem List Decreased strength;Decreased activity tolerance;Decreased balance;Decreased mobility;Decreased knowledge of use of DME;Decreased knowledge of precautions;Pain       PT Treatment Interventions DME instruction;Gait training;Stair training;Functional mobility training;Therapeutic activities;Therapeutic exercise;Balance training;Patient/family education    PT Goals (Current goals can be found in the Care Plan section)  Acute Rehab PT Goals Patient Stated Goal: return home PT Goal Formulation: With patient Time For Goal Achievement: 01/18/21 Potential to Achieve Goals: Good    Frequency Min 3X/week   Barriers to discharge        Co-evaluation               AM-PAC PT "6 Clicks" Mobility  Outcome Measure Help needed turning from your back to your side while in a flat bed without using bedrails?: None Help needed moving from lying on your back to sitting on the side of a flat bed without using bedrails?: None Help needed moving to and from a bed to a chair (including a wheelchair)?: None Help needed  standing up from a chair using your arms (e.g., wheelchair or bedside chair)?: A Little Help needed to walk in hospital room?: A Little Help needed climbing 3-5 steps with a railing? : A Little 6 Click Score: 21    End of Session   Activity Tolerance: Patient tolerated treatment well Patient left: in bed;with call bell/phone within reach;with bed alarm set;with family/visitor present Nurse Communication: Mobility status PT Visit Diagnosis: Unsteadiness on feet (R26.81);Repeated falls (R29.6)    Time: 1638-4536 PT Time Calculation (min) (ACUTE ONLY): 23 min   Charges:   PT Evaluation $PT Eval Moderate Complexity: 1 Mod PT Treatments $Gait Training: 8-22 mins        Lyanne Co, PT  Acute Rehab Services  Pager [phone] Office [phone]   Lawana Chambers Amila Callies 01/04/2021, 12:59 PM

## 2021-01-05 DIAGNOSIS — I10 Essential (primary) hypertension: Secondary | ICD-10-CM

## 2021-01-05 LAB — GLUCOSE, CAPILLARY
Glucose-Capillary: 209 mg/dL — ABNORMAL HIGH (ref 70–99)
Glucose-Capillary: 225 mg/dL — ABNORMAL HIGH (ref 70–99)

## 2021-01-05 LAB — BASIC METABOLIC PANEL
Anion gap: 4 — ABNORMAL LOW (ref 5–15)
BUN: 18 mg/dL (ref 8–23)
CO2: 26 mmol/L (ref 22–32)
Calcium: 9.1 mg/dL (ref 8.9–10.3)
Chloride: 106 mmol/L (ref 98–111)
Creatinine, Ser: 0.73 mg/dL (ref 0.44–1.00)
GFR, Estimated: >60 mL/min (ref >60)
Glucose, Bld: 216 mg/dL — ABNORMAL HIGH (ref 70–99)
Potassium: 3.8 mmol/L (ref 3.5–5.1)
Sodium: 136 mmol/L (ref 135–145)

## 2021-01-05 LAB — CBC
HCT: 36.8 % (ref 36.0–46.0)
Hemoglobin: 12.3 g/dL (ref 12.0–15.0)
MCH: 29.3 pg (ref 26.0–34.0)
MCHC: 33.4 g/dL (ref 30.0–36.0)
MCV: 87.6 fL (ref 80.0–100.0)
Platelets: 219 10*3/uL (ref 150–400)
RBC: 4.2 MIL/uL (ref 3.87–5.11)
RDW: 14.4 % (ref 11.5–15.5)
WBC: 7 10*3/uL (ref 4.0–10.5)
nRBC: 0 % (ref 0.0–0.2)

## 2021-01-05 LAB — MAGNESIUM: Magnesium: 1.6 mg/dL — ABNORMAL LOW (ref 1.7–2.4)

## 2021-01-05 LAB — TSH: TSH: 1.21 u[IU]/mL (ref 0.350–4.500)

## 2021-01-05 MED ORDER — ATORVASTATIN CALCIUM 40 MG PO TABS: 40.0000 mg | ORAL_TABLET | Freq: Every day | ORAL | 0 refills | Status: DC

## 2021-01-05 MED ORDER — ASPIRIN EC 81 MG PO TBEC: 81.0000 mg | DELAYED_RELEASE_TABLET | Freq: Every day | ORAL | 11 refills | Status: DC

## 2021-01-05 MED ORDER — PROPRANOLOL HCL 10 MG PO TABS: 5.0000 mg | ORAL_TABLET | Freq: Two times a day (BID) | ORAL | 0 refills | Status: DC

## 2021-01-05 NOTE — Progress Notes (Signed)
STROKE TEAM PROGRESS NOTE   INTERVAL HISTORY No acute events since arrival  Presented with transient episode of left facial and arm numbness lasting less than one hour. Less convincing for TIA.    EEG read is pending Sitting up in bed brushing her hair, excited to be going home.  We again discussed plan of care, work up and findings. Questions were addressed. Another son at bedside.  Vitals:   01/04/21 1550 01/04/21 2027 01/04/21 2307 01/05/21 0356  BP: (!) 160/59 (!) 179/65 (!) 167/55 (!) 159/63  Pulse: (!) 58 61 (!) 59 (!) 52  Resp: 20 17 19 18   Temp: 98.5 F (36.9 C) 98.1 F (36.7 C) 97.9 F (36.6 C) 98.3 F (36.8 C)  TempSrc: Oral Oral Oral   SpO2: 99% 99% 98% 98%  Weight:      Height:       CBC:  Recent Labs  Lab 01/04/21 0303 01/05/21 0502  WBC 7.5 7.0  HGB 12.1 12.3  HCT 36.2 36.8  MCV 86.8 87.6  PLT 223 219   Basic Metabolic Panel:  Recent Labs  Lab 01/03/21 1533 01/04/21 0303 01/05/21 0502  NA 135  --  136  K 3.8  --  3.8  CL 102  --  106  CO2 24  --  26  GLUCOSE 145*  --  216*  BUN 32*  --  18  CREATININE 0.62 0.63 0.73  CALCIUM 9.9  --  9.1  MG  --   --  1.6*   Lipid Panel:  Recent Labs  Lab 01/04/21 0303  CHOL 227*  TRIG 72  HDL 93  CHOLHDL 2.4  VLDL 14  LDLCALC 01/06/21*   HgbA1c:  Recent Labs  Lab 01/04/21 0303  HGBA1C 8.6*   Urine Drug Screen:  Recent Labs  Lab 01/03/21 1533  LABOPIA NONE DETECTED  COCAINSCRNUR NONE DETECTED  LABBENZ NONE DETECTED  AMPHETMU NONE DETECTED  THCU NONE DETECTED  LABBARB NONE DETECTED    Alcohol Level No results for input(s): ETH in the last 168 hours.  IMAGING past 24 hours ECHOCARDIOGRAM COMPLETE  Result Date: 01/04/2021    ECHOCARDIOGRAM REPORT   Patient Name:   Kiara Blair Date of Exam: 01/04/2021 Medical Rec #:  01/06/2021    Height:       60.0 in Accession #:    [id number]   Weight:       95.0 lb Date of Birth:  04-01-1930     BSA:          1.360 m Patient Age:    87 years     BP:            136/57 mmHg Patient Gender: F            HR:           49 bpm. Exam Location:  Inpatient Procedure: 2D Echo, Cardiac Doppler and Color Doppler Indications:    TIA  History:        Patient has no prior history of Echocardiogram examinations.                 Risk Factors:Hypertension, Dyslipidemia and Diabetes.  Sonographer:    05/11/1930 RDCS Referring Phys: [id number] ARSHAD N KAKRAKANDY IMPRESSIONS  1. Left ventricular ejection fraction, by estimation, is 60 to 65%. The left ventricle has normal function. The left ventricle has no regional wall motion abnormalities. There is moderate concentric left ventricular hypertrophy. Left ventricular diastolic parameters are consistent with Grade  I diastolic dysfunction (impaired relaxation).  2. Right ventricular systolic function is normal. The right ventricular size is normal. There is normal pulmonary artery systolic pressure.  3. Left atrial size was severely dilated.  4. The mitral valve is normal in structure. Trivial mitral valve regurgitation. No evidence of mitral stenosis.  5. The aortic valve is tricuspid. There is mild calcification of the aortic valve. There is mild thickening of the aortic valve. Aortic valve regurgitation is mild. No aortic stenosis is present.  6. The inferior vena cava is normal in size with greater than 50% respiratory variability, suggesting right atrial pressure of 3 mmHg. FINDINGS  Left Ventricle: Left ventricular ejection fraction, by estimation, is 60 to 65%. The left ventricle has normal function. The left ventricle has no regional wall motion abnormalities. The left ventricular internal cavity size was normal in size. There is  moderate concentric left ventricular hypertrophy. Left ventricular diastolic parameters are consistent with Grade I diastolic dysfunction (impaired relaxation). Indeterminate filling pressures. Right Ventricle: The right ventricular size is normal. No increase in right ventricular wall thickness. Right  ventricular systolic function is normal. There is normal pulmonary artery systolic pressure. The tricuspid regurgitant velocity is 2.05 m/s, and  with an assumed right atrial pressure of 3 mmHg, the estimated right ventricular systolic pressure is 19.8 mmHg. Left Atrium: Left atrial size was severely dilated. Right Atrium: Right atrial size was normal in size. Pericardium: There is no evidence of pericardial effusion. Mitral Valve: The mitral valve is normal in structure. Trivial mitral valve regurgitation. No evidence of mitral valve stenosis. Tricuspid Valve: The tricuspid valve is normal in structure. Tricuspid valve regurgitation is mild . No evidence of tricuspid stenosis. Aortic Valve: The aortic valve is tricuspid. There is mild calcification of the aortic valve. There is mild thickening of the aortic valve. Aortic valve regurgitation is mild. Aortic regurgitation PHT measures 649 msec. No aortic stenosis is present. Aortic valve mean gradient measures 2.0 mmHg. Aortic valve peak gradient measures 4.5 mmHg. Aortic valve area, by VTI measures 1.94 cm. Pulmonic Valve: The pulmonic valve was normal in structure. Pulmonic valve regurgitation is not visualized. No evidence of pulmonic stenosis. Aorta: The aortic root is normal in size and structure. Venous: The inferior vena cava is normal in size with greater than 50% respiratory variability, suggesting right atrial pressure of 3 mmHg. IAS/Shunts: No atrial level shunt detected by color flow Doppler.  LEFT VENTRICLE PLAX 2D LVIDd:         3.60 cm     Diastology LVIDs:         2.80 cm     LV e' medial:   5.18 cm/s LV PW:         1.60 cm     LV E/e' medial: 9.9 LV IVS:        1.40 cm LVOT diam:     2.10 cm LV SV:         44 LV SV Index:   33 LVOT Area:     3.46 cm  LV Volumes (MOD) LV vol d, MOD A2C: 47.5 ml LV vol d, MOD A4C: 85.5 ml LV vol s, MOD A4C: 29.9 ml LV SV MOD A4C:     85.5 ml RIGHT VENTRICLE TAPSE (M-mode): 2.8 cm  PULMONARY VEINS                          A Reversal Duration: 90.00 msec  A Reversal Velocity: 35.10 cm/s                         Diastolic Velocity:  27.40 cm/s                         S/D Velocity:        2.20                         Systolic Velocity:   60.40 cm/s LEFT ATRIUM           Index       RIGHT ATRIUM           Index LA diam:      3.60 cm 2.65 cm/m  RA Area:     12.20 cm LA Vol (A2C): 24.2 ml 17.79 ml/m RA Volume:   25.80 ml  18.97 ml/m LA Vol (A4C): 53.4 ml 39.25 ml/m  AORTIC VALVE                   PULMONIC VALVE AV Area (Vmax):    1.71 cm    PV Vmax:       0.70 m/s AV Area (Vmean):   1.84 cm    PV Vmean:      51.100 cm/s AV Area (VTI):     1.94 cm    PV VTI:        0.182 m AV Vmax:           106.00 cm/s PV Peak grad:  1.9 mmHg AV Vmean:          70.800 cm/s PV Mean grad:  1.0 mmHg AV VTI:            0.229 m AV Peak Grad:      4.5 mmHg AV Mean Grad:      2.0 mmHg LVOT Vmax:         52.20 cm/s LVOT Vmean:        37.600 cm/s LVOT VTI:          0.128 m LVOT/AV VTI ratio: 0.56 AI PHT:            649 msec  AORTA Ao Root diam: 2.90 cm MITRAL VALVE               TRICUSPID VALVE MV Area (PHT): 3.60 cm    TR Peak grad:   16.8 mmHg MV Decel Time: 211 msec    TR Vmax:        205.00 cm/s MV E velocity: 51.40 cm/s MV A velocity: 85.70 cm/s  SHUNTS MV E/A ratio:  0.60        Systemic VTI:  0.13 m                            Systemic Diam: 2.10 cm Chilton Si MD Electronically signed by Chilton Si MD Signature Date/Time: 01/04/2021/11:53:29 AM    Final    PHYSICAL EXAM Constitutional: Appears well-developed and well-nourished elderl lady. Sitting up in bed conversing with staff in NAD.  Psych: Affect appropriate to situation Eyes: No scleral injection HENT: No OP obstruction MSK: no joint deformities.  Cardiovascular: Normal rate and regular rhythm.  Respiratory: Effort normal, non-labored breathing GI: Soft.  No distension. There is no tenderness.  Skin: WDI  Neuro: Mental Status: Patient is  awake, alert, oriented to person,  place, month, year, and situation. Patient is able to give a clear and coherent history. No signs of aphasia or neglect Cranial Nerves: II: Visual Fields are full. Pupils are equal, round, and reactive to light.   III,IV, VI: EOMI without ptosis or diploplia.  V: Facial sensation is slightly diminished on the left.  VII: Facial movement with ? Mild left facial weakness.  VIII: hearing is intact to voice X: Uvula elevates symmetrically XI: Shoulder shrug is symmetric. XII: tongue is midline without atrophy or fasciculations.  Motor: Tone is normal. Bulk is normal. 5/5 strength was present in all four extremities, though shoulder movement is limited on the lef tdue ot pain.  Sensory: Sensation is symmetric to light touch and temperature in the arms and legs. Cerebellar: Intentional tremor without ataxia bilaterally.    ASSESSMENT/PLAN  Kiara Blair is a 87 y.o. female with a significant risk factors for stroke including  hypertension, hyperlipidemia, diabetes who presents with left-sided numbness involving the face and arm that lasted for a few minutes.  It started 3/27 around 3 PM, lasting less than 1 hour. Evaluated by tele stroke for similar episode July last year.   Stroke like episode, less convincing for TIA.      Code Stroke CT head: No acute abnormality.   MRI:  No acute intracranial abnormality.Fetal origin of the left PCA  MRA head and neck:  Normal intracranial MRA.  2D Echo: 60-65% EF, +LVH, Grade I diastolic dysfunction. No thrombus, wall motion abnormality or shunt found.   EEG pending  LDL 120  HgbA1c 8.6  VTE prophylaxis -     Diet   Diet heart healthy/carb modified Room service appropriate? Yes; Fluid consistency: Thin    No anticoagulant or antiplatelet prior to admission  Therapy recommendations:  OT pending/PT cleared for home with HHPT.   Disposition:  TBD-likely home  Hypertension  UnsStable  Permissive  hypertension (OK if < 220/120) but gradually normalize in 5-7 days . Long-term BP goal normotensive  Hyperlipidemia  Home meds: None   LDL 120, goal < 70  Add  Lipitor 40mg    Continue statin at discharge  Diabetes type II Uncontrolled  HgbA1c 8.6, goal < 7.0  CBGs Recent Labs    01/04/21 1552 01/04/21 2136 01/05/21 0629  GLUCAP 168* 266* 209*      SSI  Other Stroke Risk Factors  Advanced Age >/= 365   Other Active Problems   I have personally obtained history,examined this patient, reviewed notes, independently viewed imaging studies, participated in medical decision making and plan of care.ROS completed by me personally and pertinent positives fully documented  I have made any additions or clarifications directly to the above note. Agree with note above.  Plan check EEG results and discharge patient home today.  May follow-up as an outpatient in the stroke clinic if necessary.  Discussed with patient and family and answered questions.  Discussed with Dr. Jomarie LongsJoseph.  Greater than 50% time during this 25-minute visit was spent in counseling and coordination of care and discussion with care team.  Delia HeadyPramod Elfreda Blanchet, MD Medical Director [facility] Pager: [phone] 01/05/2021 2:36 PM  Hospital day # 1 To contact Stroke Continuity provider, please refer to [email or web address]. After hours, contact General Neurology

## 2021-01-05 NOTE — TOC Transition Note (Signed)
Transition of Care [facility]) - CM/SW Discharge Note   Patient Details  Name: Kiara Blair MRN: [id number] Date of Birth: May 14, 1930  Transition of Care [facility]) CM/SW Contact:  Kermit Balo, RN Phone Number: 01/05/2021, 3:47 PM   Clinical Narrative:    Pt is discharging home today with resumption of HH services.  Family to obtain tub seat outside the hospital setting.  Pt has transportation home.    Final next level of care: Home w Home Health Services Barriers to Discharge: No Barriers Identified   Patient Goals and CMS Choice   CMS Medicare.gov Compare Post Acute Care list provided to:: Patient Choice offered to / list presented to : Patient  Discharge Placement                       Discharge Plan and Services   Discharge Planning Services: CM Consult Post Acute Care Choice: Home Health                    HH Arranged: PT,Nurse's Aide [facility] Agency: [facility] Date [facility] Agency Contacted: 01/04/21   Representative spoke with at [facility] Agency: [facility] Grippe  Social Determinants of Health (SDOH) Interventions     Readmission Risk Interventions No flowsheet data found.

## 2021-01-05 NOTE — Plan of Care (Signed)
  Problem: Education: Goal: Knowledge of General Education information will improve Description: Including pain rating scale, medication(s)/side effects and non-pharmacologic comfort measures Outcome: Adequate for Discharge   Problem: Health Behavior/Discharge Planning: Goal: Ability to manage health-related needs will improve Outcome: Adequate for Discharge   Problem: Clinical Measurements: Goal: Ability to maintain clinical measurements within normal limits will improve Outcome: Adequate for Discharge Goal: Will remain free from infection Outcome: Adequate for Discharge Goal: Diagnostic test results will improve Outcome: Adequate for Discharge Goal: Respiratory complications will improve Outcome: Adequate for Discharge Goal: Cardiovascular complication will be avoided Outcome: Adequate for Discharge   Problem: Activity: Goal: Risk for activity intolerance will decrease Outcome: Adequate for Discharge   Problem: Nutrition: Goal: Adequate nutrition will be maintained Outcome: Adequate for Discharge   Problem: Coping: Goal: Level of anxiety will decrease Outcome: Adequate for Discharge   Problem: Elimination: Goal: Will not experience complications related to bowel motility Outcome: Adequate for Discharge Goal: Will not experience complications related to urinary retention Outcome: Adequate for Discharge   Problem: Pain Managment: Goal: General experience of comfort will improve Outcome: Adequate for Discharge   Problem: Safety: Goal: Ability to remain free from injury will improve Outcome: Adequate for Discharge   Problem: Skin Integrity: Goal: Risk for impaired skin integrity will decrease Outcome: Adequate for Discharge   Problem: Education: Goal: Knowledge of disease or condition will improve Outcome: Adequate for Discharge Goal: Knowledge of secondary prevention will improve Outcome: Adequate for Discharge Goal: Knowledge of patient specific risk factors  addressed and post discharge goals established will improve Outcome: Adequate for Discharge Goal: Individualized Educational Video(s) Outcome: Adequate for Discharge   Problem: Health Behavior/Discharge Planning: Goal: Ability to manage health-related needs will improve Outcome: Adequate for Discharge   Problem: Self-Care: Goal: Ability to participate in self-care as condition permits will improve Outcome: Adequate for Discharge Goal: Verbalization of feelings and concerns over difficulty with self-care will improve Outcome: Adequate for Discharge Goal: Ability to communicate needs accurately will improve Outcome: Adequate for Discharge   Problem: Nutrition: Goal: Risk of aspiration will decrease Outcome: Adequate for Discharge Goal: Dietary intake will improve Outcome: Adequate for Discharge   Problem: Intracerebral Hemorrhage Tissue Perfusion: Goal: Complications of Intracerebral Hemorrhage will be minimized Outcome: Adequate for Discharge   Problem: Ischemic Stroke/TIA Tissue Perfusion: Goal: Complications of ischemic stroke/TIA will be minimized Outcome: Adequate for Discharge   Problem: Spontaneous Subarachnoid Hemorrhage Tissue Perfusion: Goal: Complications of Spontaneous Subarachnoid Hemorrhage will be minimized Outcome: Adequate for Discharge

## 2021-01-05 NOTE — Progress Notes (Signed)
Physical Therapy Treatment Patient Details Name: Kiara Blair MRN: [id number] DOB: Jun 05, 1930 Today's Date: 01/05/2021    History of Present Illness Pt is 87 yo female who presents with L facial and LUE numbness. CT and MRI unremarkable. Pt had a fall ~2 wks ago and has had shoulder pain since that time. PMH: DM2, HTN, RUE tremor    PT Comments    Pt received sitting on EOB, eager to walk. Generally supervision to min guard for mobility. Cueing needed for pacing and safe navigation of RW. Pt with difficulty staying inside of RW, tending to stand outside of RW or shifted to the right side. Pt unaware of drift, stating she likely does the same thing at home. No overt LOB but pt with narrow BOS and slightly unsteady especially when trying to multitask during ambulation. Education provided on importance of using RW at home, pacing, and home set up to prevent fall. Son present and supportive. Pt likely close to baseline mobility. Will continue to follow pt acutely.   Follow Up Recommendations  Home health PT;Supervision - Intermittent [facility])     Equipment Recommendations       Recommendations for Other Services       Precautions / Restrictions Precautions Precautions: Fall Precaution Comments: has fallen a few times at home Restrictions Weight Bearing Restrictions: No    Mobility  Bed Mobility Overal bed mobility: Needs Assistance Bed Mobility: Supine to Sit     Supine to sit: Supervision Sit to supine: Supervision   General bed mobility comments: supervision for safety    Transfers Overall transfer level: Needs assistance Equipment used: Rolling walker (2 wheeled) Transfers: Sit to/from Stand Sit to Stand: Min guard            Ambulation/Gait Ambulation/Gait assistance: Min guard Gait Distance (Feet): 150 Feet (10+140) Assistive device: Rolling walker (2 wheeled) Gait Pattern/deviations: Step-through pattern;Trunk flexed;Narrow base of support;Decreased stride  length;Drifts right/left Gait velocity: decreased   General Gait Details: Cueing for safe navigation with RW and pacing. Drifts right/left when trying to multitask, stating she does the same at home   Stairs Stairs: Yes Stairs assistance: Min guard Stair Management: Alternating pattern;Forwards;Two rails Number of Stairs: 6 General stair comments: Reliant on rails, no LOB but slightly unsteady   Wheelchair Mobility    Modified Rankin (Stroke Patients Only)       Balance Overall balance assessment: Needs assistance;History of Falls Sitting-balance support: Feet supported;No upper extremity supported Sitting balance-Leahy Scale: Fair     Standing balance support: During functional activity;Bilateral upper extremity supported Standing balance-Leahy Scale: Fair Standing balance comment: Fairly steady in static standing, able to stand to clean self after using bathroom and to wash hands, highly benefits from UE support for ambulation                            Cognition Arousal/Alertness: Awake/alert Behavior During Therapy: WFL for tasks assessed/performed Overall Cognitive Status: Within Functional Limits for tasks assessed                                        Exercises      General Comments General comments (skin integrity, edema, etc.): Son present for session      Pertinent Vitals/Pain Pain Assessment: No/denies pain    Home Living  Prior Function            PT Goals (current goals can now be found in the care plan section) Acute Rehab PT Goals Patient Stated Goal: return home    Frequency    Min 3X/week      PT Plan Current plan remains appropriate    Co-evaluation              AM-PAC PT "6 Clicks" Mobility   Outcome Measure  Help needed turning from your back to your side while in a flat bed without using bedrails?: None Help needed moving from lying on your back to sitting on  the side of a flat bed without using bedrails?: None Help needed moving to and from a bed to a chair (including a wheelchair)?: None Help needed standing up from a chair using your arms (e.g., wheelchair or bedside chair)?: A Little Help needed to walk in hospital room?: A Little Help needed climbing 3-5 steps with a railing? : A Little 6 Click Score: 21    End of Session Equipment Utilized During Treatment: Gait belt Activity Tolerance: Patient tolerated treatment well Patient left: in bed;with call bell/phone within reach;with bed alarm set;with family/visitor present Nurse Communication: Mobility status PT Visit Diagnosis: Unsteadiness on feet (R26.81);Repeated falls (R29.6)     Time: 1100-1131 PT Time Calculation (min) (ACUTE ONLY): 31 min  Charges:  $Gait Training: 23-37 mins                     Conley Rolls, SPT

## 2021-01-05 NOTE — Discharge Summary (Signed)
Physician Discharge Summary  Kiara ChurnLois Blair ZOX:[id number] DOB: 12/09/1929 DOA: 01/03/2021  PCP: Kiara Blair, Kiara Blair  Admit date: 01/03/2021 Discharge date: 01/05/2021  Time spent: 35 minutes  Recommendations for Outpatient Follow-up:  1. PCP in 1 week 2. [place] [facility] Kiara Blair in 4 weeks   Discharge Diagnoses:  Principal Problem:   TIA (transient ischemic attack) Essential tremors   Essential hypertension   Diabetes mellitus type 2 in nonobese [facility](HCC)   Discharge Condition: Stable  Diet recommendation: Diabetic, heart healthy  Filed Weights   01/03/21 1433  Weight: 43.1 kg    History of present illness:  87yo w/ a hx of DM2, HLD, and HTN who presented to the ER with left facial numbness and left upper extremity numbness which began the afternoon prior to her presentation.  In fact the morning of her presentation she awoke to find that her symptoms had resolved.  In the ER CT head was unremarkable.  Hospital Course:   Possible TIA -Left facial and arm numbness, resolved -Followed by neurology this admission -MRI and MRA head/neck with no acute abnormalities  -EEG was unremarkable as well -LDL was 120 and hemoglobin A1c was 8.6 -Started on aspirin and statin daily per neurology recommendations -PT OT eval completed, home health PT recommended and set up prior to discharge  Sinus bradycardia Telemetry capturing heart rates as low as 47 during this hospital stay with preserved blood pressure -likely due to use of propranolol at home for benign essential tremor  -TSH was okay, propranolol dose decreased  HTN -resumed antihypertensives at discharge  Benign essential tremor As noted above, dose of BB signif decreased due to bradycardia  Uncontrolled DM2 with hyperglycemia A1c 8.6 - -continue oral hypoglycemic  Uncontrolled HLD LDL 120 -Lipitor added  Consultations:  Neurology Kiara Blair  Discharge Exam: Vitals:   01/05/21 1023 01/05/21 1240  BP: (!)  177/69 140/67  Pulse: 67 64  Resp:  17  Temp:  97.6 F (36.4 C)  SpO2:  97%    General: AAOx3, no distress Cardiovascular: S1-S2, regular rate rhythm Respiratory: Clear  Discharge Instructions   Discharge Instructions    Ambulatory referral to Neurology   Complete by: As directed    An appointment is requested in approximately: Kiara BlissMcClure NP or Kiara Blair in 6 weeks   Diet - low sodium heart healthy   Complete by: As directed    Increase activity slowly   Complete by: As directed      Allergies as of 01/05/2021      Reactions   Doxycycline Hyclate Nausea And Vomiting, Other (See Comments)   Levofloxacin Nausea And Vomiting   Meloxicam Nausea And Vomiting   Naproxen-esomeprazole Nausea And Vomiting   Nitrofurantoin Nausea And Vomiting   Other    Pt states she is allergic to meds but does not know names   Pneumococcal Polysaccharide Vaccine Itching      Medication List    TAKE these medications   amLODipine-benazepril 5-20 MG capsule Commonly known as: LOTREL Take 1 capsule by mouth daily.   aspirin EC 81 MG tablet Take 1 tablet (81 mg total) by mouth daily. Swallow whole.   atorvastatin 40 MG tablet Commonly known as: LIPITOR Take 1 tablet (40 mg total) by mouth daily. Start taking on: January 06, 2021   Kazano 12.5-500 MG Tabs Generic drug: Alogliptin-metFORMIN HCl Take 1 tablet by mouth 2 (two) times daily.   multivitamin with minerals Tabs tablet Take 1 tablet by mouth daily. Kiara Blair Vitamins.  propranolol 10 MG tablet Commonly known as: INDERAL Take 0.5 tablets (5 mg total) by mouth 2 (two) times daily.      Allergies  Allergen Reactions  . Doxycycline Hyclate Nausea And Vomiting and Other (See Comments)  . Levofloxacin Nausea And Vomiting  . Meloxicam Nausea And Vomiting  . Naproxen-Esomeprazole Nausea And Vomiting  . Nitrofurantoin Nausea And Vomiting  . Other     Pt states she is allergic to meds but does not know names  . Pneumococcal  Polysaccharide Vaccine Itching    Follow-up Information    Kiara Riley, Kiara Blair Follow up in 4 week(s).   Specialties: Neurology, Radiology Contact information: [address] [place] [address] [phone]        Kiara Nay, Blair. Schedule an appointment as soon as possible for a visit in 1 week(s).   Specialty: Physician Assistant Contact information: [address] [place] [address] [phone]                The results of significant diagnostics from this hospitalization (including imaging, microbiology, ancillary and laboratory) are listed below for reference.    Significant Diagnostic Studies: DG Chest 2 View  Result Date: 01/03/2021 CLINICAL DATA:  Facial droop EXAM: CHEST - 2 VIEW COMPARISON:  08/27/2018 FINDINGS: The heart size and mediastinal contours are within normal limits. Both lungs are clear. Disc degenerative disease thoracic spine. IMPRESSION: No acute abnormality of the lungs. Electronically Signed   By: Lauralyn Primes M.D.   On: 01/03/2021 16:34   DG Shoulder Right  Result Date: 01/03/2021 CLINICAL DATA:  87 year old female with right shoulder pain after fall 1 week ago. EXAM: RIGHT SHOULDER - 2+ VIEW COMPARISON:  09/02/2019 FINDINGS: No acute fracture. Similar appearing superior subluxation of the humeral head relative to the glenoid fossa. Mild periarticular osteophyte formation and subchondral sclerosis, similar to 2020 comparison. Soft tissues are unremarkable. IMPRESSION: 1. No acute fracture or malalignment. 2. Similar appearing chronic superior subluxation of the humeral head relative to the glenoid fossa as could be seen with chronic rotator cuff abnormality. Electronically Signed   By: Marliss Coots Kiara Blair   On: 01/03/2021 16:34   CT HEAD WO CONTRAST  Result Date: 01/03/2021 CLINICAL DATA:  Left face and arm numbness yesterday. EXAM: CT HEAD WITHOUT CONTRAST TECHNIQUE: Contiguous axial images were obtained from the base of the  skull through the vertex without intravenous contrast. COMPARISON:  MRI brain and CT head dated May 01, 2020. FINDINGS: Brain: No evidence of acute infarction, hemorrhage, hydrocephalus, extra-axial collection or mass lesion/mass effect. Stable mild atrophy and chronic microvascular ischemic changes. Vascular: Calcified atherosclerosis at the skullbase. No hyperdense vessel. Skull: Normal. Negative for fracture or focal lesion. Sinuses/Orbits: No acute finding. Other: None. IMPRESSION: 1. No acute intracranial abnormality. 2. Stable mild atrophy and chronic microvascular ischemic changes. Electronically Signed   By: Obie Dredge M.D.   On: 01/03/2021 16:24   MR ANGIO HEAD WO CONTRAST  Result Date: 01/04/2021 CLINICAL DATA:  Left-sided numbness EXAM: MRI HEAD WITHOUT CONTRAST MRA HEAD WITHOUT CONTRAST MRA NECK WITHOUT CONTRAST TECHNIQUE: Multiplanar, multiecho pulse sequences of the brain and surrounding structures were obtained without intravenous contrast. Angiographic images of the Circle of Willis were obtained using MRA technique without intravenous contrast. Angiographic images of the neck were obtained using MRA technique without intravenous contrast. Carotid stenosis measurements (when applicable) are obtained utilizing NASCET criteria, using the distal internal carotid diameter as the denominator. COMPARISON:  Brain MRI 05/01/2020  FINDINGS: MRI HEAD FINDINGS Brain: No acute infarct, mass effect or extra-axial collection. No acute or chronic hemorrhage. There is multifocal hyperintense T2-weighted signal within the white matter. Parenchymal volume and CSF spaces are normal. The midline structures are normal. Vascular: Major flow voids are preserved. Skull and upper cervical spine: Normal calvarium and skull base. Visualized upper cervical spine and soft tissues are normal. Sinuses/Orbits:No paranasal sinus fluid levels or advanced mucosal thickening. No mastoid or middle ear effusion. Normal orbits.  MRA HEAD FINDINGS POSTERIOR CIRCULATION: --Vertebral arteries: Normal --Inferior cerebellar arteries: Normal. --Basilar artery: Normal. --Superior cerebellar arteries: Normal. --Posterior cerebral arteries: Normal. ANTERIOR CIRCULATION: --Intracranial internal carotid arteries: Normal. --Anterior cerebral arteries (ACA): Normal. --Middle cerebral arteries (MCA): Normal. ANATOMIC VARIANTS: Fetal origin of the left PCA. MRA NECK FINDINGS Limited time-of-flight images of the neck. Both internal carotid arteries are patent proximally. There is attenuated signal within the right vertebral artery but otherwise assessment is severely limited. IMPRESSION: 1. No acute intracranial abnormality. 2. Normal intracranial MRA. 3. Limited time-of-flight MRA of the neck. Patent internal carotid arteries. Electronically Signed   By: Deatra Robinson M.D.   On: 01/04/2021 02:55   MR ANGIO NECK WO CONTRAST  Result Date: 01/04/2021 CLINICAL DATA:  Left-sided numbness EXAM: MRI HEAD WITHOUT CONTRAST MRA HEAD WITHOUT CONTRAST MRA NECK WITHOUT CONTRAST TECHNIQUE: Multiplanar, multiecho pulse sequences of the brain and surrounding structures were obtained without intravenous contrast. Angiographic images of the Circle of Willis were obtained using MRA technique without intravenous contrast. Angiographic images of the neck were obtained using MRA technique without intravenous contrast. Carotid stenosis measurements (when applicable) are obtained utilizing NASCET criteria, using the distal internal carotid diameter as the denominator. COMPARISON:  Brain MRI 05/01/2020 FINDINGS: MRI HEAD FINDINGS Brain: No acute infarct, mass effect or extra-axial collection. No acute or chronic hemorrhage. There is multifocal hyperintense T2-weighted signal within the white matter. Parenchymal volume and CSF spaces are normal. The midline structures are normal. Vascular: Major flow voids are preserved. Skull and upper cervical spine: Normal calvarium and  skull base. Visualized upper cervical spine and soft tissues are normal. Sinuses/Orbits:No paranasal sinus fluid levels or advanced mucosal thickening. No mastoid or middle ear effusion. Normal orbits. MRA HEAD FINDINGS POSTERIOR CIRCULATION: --Vertebral arteries: Normal --Inferior cerebellar arteries: Normal. --Basilar artery: Normal. --Superior cerebellar arteries: Normal. --Posterior cerebral arteries: Normal. ANTERIOR CIRCULATION: --Intracranial internal carotid arteries: Normal. --Anterior cerebral arteries (ACA): Normal. --Middle cerebral arteries (MCA): Normal. ANATOMIC VARIANTS: Fetal origin of the left PCA. MRA NECK FINDINGS Limited time-of-flight images of the neck. Both internal carotid arteries are patent proximally. There is attenuated signal within the right vertebral artery but otherwise assessment is severely limited. IMPRESSION: 1. No acute intracranial abnormality. 2. Normal intracranial MRA. 3. Limited time-of-flight MRA of the neck. Patent internal carotid arteries. Electronically Signed   By: Deatra Robinson M.D.   On: 01/04/2021 02:55   MR BRAIN WO CONTRAST  Result Date: 01/04/2021 CLINICAL DATA:  Left-sided numbness EXAM: MRI HEAD WITHOUT CONTRAST MRA HEAD WITHOUT CONTRAST MRA NECK WITHOUT CONTRAST TECHNIQUE: Multiplanar, multiecho pulse sequences of the brain and surrounding structures were obtained without intravenous contrast. Angiographic images of the Circle of Willis were obtained using MRA technique without intravenous contrast. Angiographic images of the neck were obtained using MRA technique without intravenous contrast. Carotid stenosis measurements (when applicable) are obtained utilizing NASCET criteria, using the distal internal carotid diameter as the denominator. COMPARISON:  Brain MRI 05/01/2020 FINDINGS: MRI HEAD FINDINGS Brain: No acute infarct, mass effect or extra-axial collection.  No acute or chronic hemorrhage. There is multifocal hyperintense T2-weighted signal within  the white matter. Parenchymal volume and CSF spaces are normal. The midline structures are normal. Vascular: Major flow voids are preserved. Skull and upper cervical spine: Normal calvarium and skull base. Visualized upper cervical spine and soft tissues are normal. Sinuses/Orbits:No paranasal sinus fluid levels or advanced mucosal thickening. No mastoid or middle ear effusion. Normal orbits. MRA HEAD FINDINGS POSTERIOR CIRCULATION: --Vertebral arteries: Normal --Inferior cerebellar arteries: Normal. --Basilar artery: Normal. --Superior cerebellar arteries: Normal. --Posterior cerebral arteries: Normal. ANTERIOR CIRCULATION: --Intracranial internal carotid arteries: Normal. --Anterior cerebral arteries (ACA): Normal. --Middle cerebral arteries (MCA): Normal. ANATOMIC VARIANTS: Fetal origin of the left PCA. MRA NECK FINDINGS Limited time-of-flight images of the neck. Both internal carotid arteries are patent proximally. There is attenuated signal within the right vertebral artery but otherwise assessment is severely limited. IMPRESSION: 1. No acute intracranial abnormality. 2. Normal intracranial MRA. 3. Limited time-of-flight MRA of the neck. Patent internal carotid arteries. Electronically Signed   By: Deatra Robinson M.D.   On: 01/04/2021 02:55   DG Shoulder Left  Result Date: 01/03/2021 CLINICAL DATA:  BILATERAL shoulder pain post fall 1 week ago EXAM: LEFT SHOULDER - 2+ VIEW COMPARISON:  04/26/2018 FINDINGS: Osseous demineralization. Visualized LEFT ribs intact. LEFT AC joint degenerative changes with inferior acromial spur formation. Glenohumeral joint space narrowing with spur formation and subchondral sclerosis, including bone-on-bone appearance on the axillary view. No acute fracture, dislocation, or bone destruction. Atherosclerotic calcifications aorta. IMPRESSION: Degenerative changes of LEFT glenohumeral and LEFT acromioclavicular joints. No acute abnormalities. Aortic Atherosclerosis (ICD10-I70.0).  Electronically Signed   By: Ulyses Southward M.D.   On: 01/03/2021 16:37   ECHOCARDIOGRAM COMPLETE  Result Date: 01/04/2021    ECHOCARDIOGRAM REPORT   Patient Name:   Kiara Blair Date of Exam: 01/04/2021 Medical Rec #:  [id number]    Height:       60.0 in Accession #:    [id number]   Weight:       95.0 lb Date of Birth:  14-Aug-1930     BSA:          1.360 m Patient Age:    87 years     BP:           136/57 mmHg Patient Gender: F            HR:           49 bpm. Exam Location:  Inpatient Procedure: 2D Echo, Cardiac Doppler and Color Doppler Indications:    TIA  History:        Patient has no prior history of Echocardiogram examinations.                 Risk Factors:Hypertension, Dyslipidemia and Diabetes.  Sonographer:    Neomia Dear RDCS Referring Phys: [id number] ARSHAD N KAKRAKANDY IMPRESSIONS  1. Left ventricular ejection fraction, by estimation, is 60 to 65%. The left ventricle has normal function. The left ventricle has no regional wall motion abnormalities. There is moderate concentric left ventricular hypertrophy. Left ventricular diastolic parameters are consistent with Grade I diastolic dysfunction (impaired relaxation).  2. Right ventricular systolic function is normal. The right ventricular size is normal. There is normal pulmonary artery systolic pressure.  3. Left atrial size was severely dilated.  4. The mitral valve is normal in structure. Trivial mitral valve regurgitation. No evidence of mitral stenosis.  5. The aortic valve is tricuspid. There is mild calcification of the aortic valve. There is  mild thickening of the aortic valve. Aortic valve regurgitation is mild. No aortic stenosis is present.  6. The inferior vena cava is normal in size with greater than 50% respiratory variability, suggesting right atrial pressure of 3 mmHg. FINDINGS  Left Ventricle: Left ventricular ejection fraction, by estimation, is 60 to 65%. The left ventricle has normal function. The left ventricle has no regional wall motion  abnormalities. The left ventricular internal cavity size was normal in size. There is  moderate concentric left ventricular hypertrophy. Left ventricular diastolic parameters are consistent with Grade I diastolic dysfunction (impaired relaxation). Indeterminate filling pressures. Right Ventricle: The right ventricular size is normal. No increase in right ventricular wall thickness. Right ventricular systolic function is normal. There is normal pulmonary artery systolic pressure. The tricuspid regurgitant velocity is 2.05 m/s, and  with an assumed right atrial pressure of 3 mmHg, the estimated right ventricular systolic pressure is 19.8 mmHg. Left Atrium: Left atrial size was severely dilated. Right Atrium: Right atrial size was normal in size. Pericardium: There is no evidence of pericardial effusion. Mitral Valve: The mitral valve is normal in structure. Trivial mitral valve regurgitation. No evidence of mitral valve stenosis. Tricuspid Valve: The tricuspid valve is normal in structure. Tricuspid valve regurgitation is mild . No evidence of tricuspid stenosis. Aortic Valve: The aortic valve is tricuspid. There is mild calcification of the aortic valve. There is mild thickening of the aortic valve. Aortic valve regurgitation is mild. Aortic regurgitation PHT measures 649 msec. No aortic stenosis is present. Aortic valve mean gradient measures 2.0 mmHg. Aortic valve peak gradient measures 4.5 mmHg. Aortic valve area, by VTI measures 1.94 cm. Pulmonic Valve: The pulmonic valve was normal in structure. Pulmonic valve regurgitation is not visualized. No evidence of pulmonic stenosis. Aorta: The aortic root is normal in size and structure. Venous: The inferior vena cava is normal in size with greater than 50% respiratory variability, suggesting right atrial pressure of 3 mmHg. IAS/Shunts: No atrial level shunt detected by color flow Doppler.  LEFT VENTRICLE PLAX 2D LVIDd:         3.60 cm     Diastology LVIDs:          2.80 cm     LV e' medial:   5.18 cm/s LV PW:         1.60 cm     LV E/e' medial: 9.9 LV IVS:        1.40 cm LVOT diam:     2.10 cm LV SV:         44 LV SV Index:   33 LVOT Area:     3.46 cm  LV Volumes (MOD) LV vol d, MOD A2C: 47.5 ml LV vol d, MOD A4C: 85.5 ml LV vol s, MOD A4C: 29.9 ml LV SV MOD A4C:     85.5 ml RIGHT VENTRICLE TAPSE (M-mode): 2.8 cm  PULMONARY VEINS                         A Reversal Duration: 90.00 msec                         A Reversal Velocity: 35.10 cm/s                         Diastolic Velocity:  27.40 cm/s  S/D Velocity:        2.20                         Systolic Velocity:   60.40 cm/s LEFT ATRIUM           Index       RIGHT ATRIUM           Index LA diam:      3.60 cm 2.65 cm/m  RA Area:     12.20 cm LA Vol (A2C): 24.2 ml 17.79 ml/m RA Volume:   25.80 ml  18.97 ml/m LA Vol (A4C): 53.4 ml 39.25 ml/m  AORTIC VALVE                   PULMONIC VALVE AV Area (Vmax):    1.71 cm    PV Vmax:       0.70 m/s AV Area (Vmean):   1.84 cm    PV Vmean:      51.100 cm/s AV Area (VTI):     1.94 cm    PV VTI:        0.182 m AV Vmax:           106.00 cm/s PV Peak grad:  1.9 mmHg AV Vmean:          70.800 cm/s PV Mean grad:  1.0 mmHg AV VTI:            0.229 m AV Peak Grad:      4.5 mmHg AV Mean Grad:      2.0 mmHg LVOT Vmax:         52.20 cm/s LVOT Vmean:        37.600 cm/s LVOT VTI:          0.128 m LVOT/AV VTI ratio: 0.56 AI PHT:            649 msec  AORTA Ao Root diam: 2.90 cm MITRAL VALVE               TRICUSPID VALVE MV Area (PHT): 3.60 cm    TR Peak grad:   16.8 mmHg MV Decel Time: 211 msec    TR Vmax:        205.00 cm/s MV E velocity: 51.40 cm/s MV A velocity: 85.70 cm/s  SHUNTS MV E/A ratio:  0.60        Systemic VTI:  0.13 m                            Systemic Diam: 2.10 cm Chilton Si Kiara Blair Electronically signed by Chilton Si Kiara Blair Signature Date/Time: 01/04/2021/11:53:29 AM    Final     Microbiology: Recent Results (from the past 240 hour(s))  Resp  Panel by RT-PCR (Flu A&B, Covid) Nasopharyngeal Swab     Status: None   Collection Time: 01/03/21  5:27 PM   Specimen: Nasopharyngeal Swab; Nasopharyngeal(NP) swabs in vial transport medium  Result Value Ref Range Status   SARS Coronavirus 2 by RT PCR NEGATIVE NEGATIVE Final    Comment: (NOTE) SARS-CoV-2 target nucleic acids are NOT DETECTED.  The SARS-CoV-2 RNA is generally detectable in upper respiratory specimens during the acute phase of infection. The lowest concentration of SARS-CoV-2 viral copies this assay can detect is 138 copies/mL. A negative result does not preclude SARS-Cov-2 infection and should not be used as the sole basis for treatment or other patient management decisions. A negative  result may occur with  improper specimen collection/handling, submission of specimen other than nasopharyngeal swab, presence of viral mutation(s) within the areas targeted by this assay, and inadequate number of viral copies(<138 copies/mL). A negative result must be combined with clinical observations, patient history, and epidemiological information. The expected result is Negative.  Fact Sheet for Patients:  BloggerCourse.com  Fact Sheet for Healthcare Providers:  SeriousBroker.it  This test is no t yet approved or cleared by the [place] FDA and  has been authorized for detection and/or diagnosis of SARS-CoV-2 by FDA under an Emergency Use Authorization (EUA). This EUA will remain  in effect (meaning this test can be used) for the duration of the COVID-19 declaration under Section 564(b)(1) of the Act, 21 U.S.C.section 360bbb-3(b)(1), unless the authorization is terminated  or revoked sooner.       Influenza A by PCR NEGATIVE NEGATIVE Final   Influenza B by PCR NEGATIVE NEGATIVE Final    Comment: (NOTE) The Xpert Xpress SARS-CoV-2/FLU/RSV plus assay is intended as an aid in the diagnosis of influenza from Nasopharyngeal  swab specimens and should not be used as a sole basis for treatment. Nasal washings and aspirates are unacceptable for Xpert Xpress SARS-CoV-2/FLU/RSV testing.  Fact Sheet for Patients: BloggerCourse.com  Fact Sheet for Healthcare Providers: SeriousBroker.it  This test is not yet approved or cleared by the [place] FDA and has been authorized for detection and/or diagnosis of SARS-CoV-2 by FDA under an Emergency Use Authorization (EUA). This EUA will remain in effect (meaning this test can be used) for the duration of the COVID-19 declaration under Section 564(b)(1) of the Act, 21 U.S.C. section 360bbb-3(b)(1), unless the authorization is terminated or revoked.  Performed at [facility], [address] [place], [place] [address]      Labs: Basic Metabolic Panel: Recent Labs  Lab 01/03/21 1533 01/04/21 0303 01/05/21 0502  NA 135  --  136  K 3.8  --  3.8  CL 102  --  106  CO2 24  --  26  GLUCOSE 145*  --  216*  BUN 32*  --  18  CREATININE 0.62 0.63 0.73  CALCIUM 9.9  --  9.1  MG  --   --  1.6*   Liver Function Tests: Recent Labs  Lab 01/03/21 1533  AST 23  ALT 20  ALKPHOS 95  BILITOT 0.2*  PROT 6.8  ALBUMIN 3.9   No results for input(s): LIPASE, AMYLASE in the last 168 hours. No results for input(s): AMMONIA in the last 168 hours. CBC: Recent Labs  Lab 01/03/21 1533 01/04/21 0303 01/05/21 0502  WBC 9.6 7.5 7.0  HGB 14.3 12.1 12.3  HCT 42.8 36.2 36.8  MCV 87.7 86.8 87.6  PLT 270 223 219   Cardiac Enzymes: No results for input(s): CKTOTAL, CKMB, CKMBINDEX, TROPONINI in the last 168 hours. BNP: BNP (last 3 results) No results for input(s): BNP in the last 8760 hours.  ProBNP (last 3 results) No results for input(s): PROBNP in the last 8760 hours.  CBG: Recent Labs  Lab 01/04/21 1113 01/04/21 1552 01/04/21 2136 01/05/21 0629 01/05/21 1247  GLUCAP 194* 168* 266* 209* 225*        Signed:  Zannie Cove Kiara Blair.  [facility] 01/05/2021, 3:26 PM

## 2021-01-05 NOTE — Progress Notes (Signed)
[facility] [place]       Electroencephalogram Procedure Note  [name] Date of Birth:  May 05, 1930 Medical Record Number:  [id number]   Indications: Diagnostic Date of Procedure 01/04/21 Medications: none Clinical history : 87 year old patient being evaluated for focal seizures Technical Description  This study was performed using 17 channel digital electroencephalographic recording equipment. International 10-20 electrode placement was used. The record was obtained with the patient awake and drowsy.  The record is of fair technical quality for purposes of interpretation.   Activation Procedures:  Not done. EEG Description Awake: Alpha Activity: The waking state record contains a well-defined bi-occipital alpha rhythm  of  low amplitude with a dominant frequency of 11Hz .  Intermittent 6 to 7 Hz generalized slowing is noted mostly during drowsiness.  Reactivity is uncertain.  No paroxsymal activity, spikes, or sharp waves are noted. Technical component of study is adequate with some movement artefacts. EKG tracing shows sinus rhytmn Length of this recording is 1 ht and 4 mins  Sleep: With drowsiness, there is attenuation of the background alpha activity. As the patient enters into light sleep, vertex waves and symmetrical spindles are noted. K complexes are noted in sleep. Transition to the waking state is unremarkable.   Result of Activation Procedures: Hyperventilation: N/A. Photo Stimulation: N/A.  Summary Normal electroencephalogram, awake, asleep and without activation procedures. There are no focal lateralizing or epileptiform features.

## 2021-02-08 ENCOUNTER — Ambulatory Visit (INDEPENDENT_AMBULATORY_CARE_PROVIDER_SITE_OTHER): Payer: Medicare Other | Admitting: Adult Health

## 2021-02-08 ENCOUNTER — Encounter: Payer: Self-pay | Admitting: Adult Health

## 2021-02-08 VITALS — BP 159/67 | HR 64 | Ht 61.0 in | Wt 100.0 lb

## 2021-02-08 DIAGNOSIS — R202 Paresthesia of skin: Secondary | ICD-10-CM | POA: Diagnosis not present

## 2021-02-08 DIAGNOSIS — G250 Essential tremor: Secondary | ICD-10-CM

## 2021-02-08 DIAGNOSIS — R200 Anesthesia of skin: Secondary | ICD-10-CM | POA: Diagnosis not present

## 2021-02-08 DIAGNOSIS — Z8669 Personal history of other diseases of the nervous system and sense organs: Secondary | ICD-10-CM | POA: Diagnosis not present

## 2021-02-08 MED ORDER — ATORVASTATIN CALCIUM 40 MG PO TABS: 40.0000 mg | ORAL_TABLET | Freq: Every day | ORAL | 3 refills | Status: AC

## 2021-02-08 NOTE — Progress Notes (Signed)
[facility] [address] [place]. [place] [address] [phone]       HOSPITAL FOLLOW UP NOTE  [name] Date of Birth:  1930/07/25 Medical Record Number:  [id number]   Reason for Referral:  hospital stroke follow up    SUBJECTIVE:   CHIEF COMPLAINT:  Chief Complaint  Patient presents with  . Follow-up    TR With son (Kiara Blair) Pt is well and stable on a stroke standpoint, some slight memory complication but not major.     HPI:   Today, 02/08/2021, Kiara Blair is being seen for recent hospital follow-up accompanied by her son.  She is a very pleasant87 y.o.femalewith PMHx of hypertension, hyperlipidemia, diabetes, migraine headaches and essential tremor who presented to [facility] ED on 01/03/2021 with left-sided numbness involving the face and arm that lasted for a few minutes. It started 3/27 around 3 PM, lasting less than 1 hour. Evaluated by tele stroke for similar episode in 04/2020.  Personally reviewed hospitalization pertinent progress notes, lab work and imaging with summary provided.  Strokelike episode possibly TIA versus seizure.  MRI negative for acute stroke and no evidence of prior strokes.  EEG negative for seizure activity.  2D echo and MRA unremarkable.  Recommended aspirin 81 mg daily for secondary stroke prevention.  LDL 120 -initiated atorvastatin 40 mg daily.  Uncontrolled DM with A1c 8.6.  Evaluated by therapies and discharged home in stable condition.  She has not had any additional numbness/tingling or stroke symptoms since discharge.  She does speak of similar episodes prior even in her childhood which would consist of progressive left arm numbness and would go up into her head and at times associated with migraine headache.  She does have chronic history of migraines but has not had any recent migraine headache.  Recent episode not associated with migraine or headache.  Unable to recall if episode in July was accompanied by headache or  migraine.  She has remained on aspirin 81 mg daily without bleeding or bruising. Per son, they did not receive prescription for atorvastatin - she does report being on a long time ago and believes she tolerated well without side effects.  Blood pressure today 159/67.  She does not routinely monitor at home unless she feels unwell.  She has remained on amlodipine-benazepril per PCP.  Remains on alogliptin-metformin for DM management.  History of essential tremor of upper extremities on propranolol but dosage decreased during admission due to bradycardia.  She is currently on propranolol 5 mg twice daily but denies any great benefit.  She is able to do most activities but has great difficulty with eating.  Denies family history of tremor or Parkinson's.  No further concerns at this time     ROS:   14 system review of systems performed and negative with exception of those listed in HPI  PMH:  Past Medical History:  Diagnosis Date  . Diabetes mellitus without complication (HCC)   . Hypercholesteremia   . Hypertension   . Tremor    right arm    PSH:  Past Surgical History:  Procedure Laterality Date  . ABDOMINAL HYSTERECTOMY    . APPENDECTOMY    . BLADDER SUSPENSION    . CHOLECYSTECTOMY    . EYE SURGERY    . TONSILLECTOMY      Social History:  Social History   Socioeconomic History  . Marital status: Widowed    Spouse name: Not on file  . Number of children: Not on file  .  Years of education: Not on file  . Highest education level: Not on file  Occupational History  . Not on file  Tobacco Use  . Smoking status: Never Smoker  . Smokeless tobacco: Never Used  Vaping Use  . Vaping Use: Never used  Substance and Sexual Activity  . Alcohol use: No  . Drug use: No  . Sexual activity: Not on file  Other Topics Concern  . Not on file  Social History Narrative  . Not on file   Social Determinants of Health   Financial Resource Strain: Not on file  Food Insecurity: Not  on file  Transportation Needs: Not on file  Physical Activity: Not on file  Stress: Not on file  Social Connections: Not on file  Intimate Partner Violence: Not on file    Family History: History reviewed. No pertinent family history.  Medications:   Current Outpatient Medications on File Prior to Visit  Medication Sig Dispense Refill  . amLODipine-benazepril (LOTREL) 5-20 MG per capsule Take 1 capsule by mouth daily.    Marland Kitchen aspirin EC 81 MG tablet Take 1 tablet (81 mg total) by mouth daily. Swallow whole. 30 tablet 11  . KAZANO 12.5-500 MG TABS Take 1 tablet by mouth 2 (two) times daily.    . Multiple Vitamin (MULTIVITAMIN WITH MINERALS) TABS tablet Take 1 tablet by mouth daily. Dr. Linton Flemings Vitamins.    Marland Kitchen propranolol (INDERAL) 10 MG tablet Take 0.5 tablets (5 mg total) by mouth 2 (two) times daily. 60 tablet 0   No current facility-administered medications on file prior to visit.    Allergies:   Allergies  Allergen Reactions  . Doxycycline Hyclate Nausea And Vomiting and Other (See Comments)  . Levofloxacin Nausea And Vomiting  . Meloxicam Nausea And Vomiting  . Naproxen-Esomeprazole Nausea And Vomiting  . Nitrofurantoin Nausea And Vomiting  . Other     Pt states she is allergic to meds but does not know names  . Pneumococcal Polysaccharide Vaccine Itching      OBJECTIVE:  Physical Exam  Vitals:   02/08/21 1410  BP: (!) 159/67  Pulse: 64  Weight: 100 lb (45.4 kg)  Height: 5\' 1"  (1.549 m)   Body mass index is 18.89 kg/m. No exam data present  General: Frail very pleasant elderly Caucasian female, seated, in no evident distress Head: head normocephalic and atraumatic.   Neck: supple with no carotid or supraclavicular bruits Cardiovascular: regular rate and rhythm, no murmurs Musculoskeletal: b/l chronic shoulder pain with limited ROM Skin:  no rash/petichiae Vascular:  Normal pulses all extremities   Neurologic Exam Mental Status: Awake and fully alert.    Fluent speech and language.  Oriented to place and time. Recent and remote memory intact. Attention span, concentration and fund of knowledge appropriate. Mood and affect appropriate.  Cranial Nerves: Fundoscopic exam reveals sharp disc margins. Pupils equal, briskly reactive to light. Extraocular movements full without nystagmus. Visual fields full to confrontation.  HOH bilaterally with hearing aids. Facial sensation intact. Face, tongue, palate moves normally and symmetrically.  Mild head tremor noted. Motor: Normal bulk and tone. Normal strength in all tested extremity muscles although difficulty fully assessing BUE d/t chronic shoulder pain Sensory.: intact to touch , pinprick , position and vibratory sensation.  Coordination: Rapid alternating movements normal in all extremities. Finger-to-nose and heel-to-shin performed accurately bilaterally.  Intention tremor R>L UE.  No evidence of cogwheel rigidity, bradykinesia or resting tremor. Gait and Station: Arises from chair without difficulty. Stance is slightly  hunched. Gait demonstrates normal stride length and balance with with use of Rollator walker. Tandem walk and heel toe not attempted due to safety concerns.  Reflexes: 1+ and symmetric. Toes downgoing.     NIHSS  0 Modified Rankin  0      ASSESSMENT: Kiara Blair is a 87 y.o. year old female presented with transient recurrent progressive left arm and face numbness lasting less than 1 hour on 01/03/2021 possibly in setting of seizure vs TIA with prior episode 04/2020 with concern at that time of partial seizure. Vascular risk factors include HTN, HLD, DM, migraine headaches and essential tremor.      PLAN:  1. Transient left arm/face numbness:  a. Unknown etiology possibly partial seizure vs complicated migraine vs TIA.  No recurrence since discharge b. Partial seizure: Recurrent episodes. EEG negative.  No history of seizure disorder c. Complicated migraine: Episodes have been  present since her childhood typically associated with migraine headache.  Recent episode not associated with migraine. d. TIA: Unable to completely rule out therefore recommend continuation of aspirin and start atorvastatin 40 mg daily as advised during recent admission for stroke prevention.  Discussed importance of close PCP follow-up for aggressive stroke risk factor management including HTN with BP goal<130/90, HLD with LDL goal<70 and DM with A1c goal<7 2. Essential tremor: Chronic longstanding history.  On propranolol 5 mg twice daily without much benefit recently lowered in setting of bradycardia. During exam, upon radial pressure of right wrist, tremor subsided.  Discussed use of wrist bracelet or weight for possible benefit.  Would be hesitant for any other type of pharmacological management as potential risk would likely outweigh benefit.  Unable to appreciate any other associated symptoms suggestive of Parkinson's disease.  We will continue to monitor.    Follow up in 3 months or call earlier if needed   CC:  [facility] provider: Dr. Pearlean Brownie PCP: Heron Nay, PA    I spent 49 minutes of face-to-face and non-face-to-face time with patient.  This included extensive review of recent hospitalization and discussion with patient and son regarding potential etiologies, educated on stroke risk factors and importance of ensuring adequate control, prolonged history of essential tremor and further nonpharmacological treatment options and answered all other questions to patient and son satisfaction   Ihor Austin, AGNP-BC  [facility] [address] [place], [place] [address]  Phone [phone] Fax [phone] Note: This document was prepared with digital dictation and possible smart phrase technology. Any transcriptional errors that result from this process are unintentional.

## 2021-02-08 NOTE — Patient Instructions (Addendum)
It is difficult to say for sure if you have numbness is due to possible TIA (or mini stroke) vs focal seizure vs complicated migraine.  As of now, we will treat this more as a TIA but please let me know if any additional symptoms should occur  Continue aspirin 81 mg daily  and restart atorvastatin  for secondary stroke prevention  Continue to follow up with PCP regarding cholesterol, blood pressure and diabetes management  Maintain strict control of hypertension with blood pressure goal below 130/90, diabetes with hemoglobin A1c goal below 7% and cholesterol with LDL cholesterol (bad cholesterol) goal below 70 mg/dL.       Followup in the future with me in 3 months or call earlier if needed       Thank you for coming to see [place] at [facility]. I hope we have been able to provide you high quality care today.  You may receive a patient satisfaction survey over the next few weeks. We would appreciate your feedback and comments so that we may continue to improve ourselves and the health of our patients.

## 2021-02-08 NOTE — Progress Notes (Signed)
I agree with the above plan 

## 2021-05-16 ENCOUNTER — Ambulatory Visit: Payer: Medicare Other | Admitting: Adult Health

## 2021-07-16 ENCOUNTER — Other Ambulatory Visit: Payer: Self-pay

## 2021-07-16 ENCOUNTER — Emergency Department (HOSPITAL_BASED_OUTPATIENT_CLINIC_OR_DEPARTMENT_OTHER)
Admission: EM | Admit: 2021-07-16 | Discharge: 2021-07-16 | Disposition: A | Discharge status: Home / Self Care | Payer: Medicare Other | Attending: Emergency Medicine | Admitting: Emergency Medicine

## 2021-07-16 ENCOUNTER — Encounter (HOSPITAL_BASED_OUTPATIENT_CLINIC_OR_DEPARTMENT_OTHER): Payer: Self-pay | Admitting: *Deleted

## 2021-07-16 DIAGNOSIS — S00212A Abrasion of left eyelid and periocular area, initial encounter: Secondary | ICD-10-CM | POA: Insufficient documentation

## 2021-07-16 DIAGNOSIS — Z23 Encounter for immunization: Secondary | ICD-10-CM | POA: Insufficient documentation

## 2021-07-16 DIAGNOSIS — Z7982 Long term (current) use of aspirin: Secondary | ICD-10-CM | POA: Diagnosis not present

## 2021-07-16 DIAGNOSIS — Z79899 Other long term (current) drug therapy: Secondary | ICD-10-CM | POA: Diagnosis not present

## 2021-07-16 DIAGNOSIS — W19XXXA Unspecified fall, initial encounter: Secondary | ICD-10-CM

## 2021-07-16 DIAGNOSIS — S51011A Laceration without foreign body of right elbow, initial encounter: Secondary | ICD-10-CM | POA: Insufficient documentation

## 2021-07-16 DIAGNOSIS — I10 Essential (primary) hypertension: Secondary | ICD-10-CM | POA: Insufficient documentation

## 2021-07-16 DIAGNOSIS — Y92009 Unspecified place in unspecified non-institutional (private) residence as the place of occurrence of the external cause: Secondary | ICD-10-CM

## 2021-07-16 DIAGNOSIS — E119 Type 2 diabetes mellitus without complications: Secondary | ICD-10-CM | POA: Diagnosis not present

## 2021-07-16 DIAGNOSIS — W08XXXA Fall from other furniture, initial encounter: Secondary | ICD-10-CM | POA: Diagnosis not present

## 2021-07-16 DIAGNOSIS — S59901A Unspecified injury of right elbow, initial encounter: Secondary | ICD-10-CM | POA: Diagnosis present

## 2021-07-16 DIAGNOSIS — S00211A Abrasion of right eyelid and periocular area, initial encounter: Secondary | ICD-10-CM | POA: Diagnosis not present

## 2021-07-16 MED ORDER — TETANUS-DIPHTH-ACELL PERTUSSIS 5-2.5-18.5 LF-MCG/0.5 IM SUSY
0.5000 mL | PREFILLED_SYRINGE | Freq: Once | INTRAMUSCULAR | Status: AC
  Administered 2021-07-16: 0.5 mL via INTRAMUSCULAR
  Filled 2021-07-16: qty 0.5

## 2021-07-16 NOTE — ED Provider Notes (Signed)
[facility] Provider Note: [name], MD, FACEP  CSN: [id number] MRN: [id number] ARRIVAL: 07/16/21 at 0002 ROOM: MH10/[facility]10   CHIEF COMPLAINT  Fall   HISTORY OF PRESENT ILLNESS  07/16/21 12:27 AM Kiara Blair is a 87 y.o. female who fell getting up from a sofa about 2 hours prior to arrival.  She landed on a carpeted floor.  She has a skin tear to her right arm and she hit her face on the floor.  Associated pain is minimal.  She did not lose consciousness.  She is not on anticoagulation.  She has chronic pain in both shoulders.  She is requesting a tetanus shot.   Past Medical History:  Diagnosis Date  . Diabetes mellitus without complication (HCC)   . Hypercholesteremia   . Hypertension   . Tremor    right arm    Past Surgical History:  Procedure Laterality Date  . ABDOMINAL HYSTERECTOMY    . APPENDECTOMY    . BLADDER SUSPENSION    . CHOLECYSTECTOMY    . EYE SURGERY    . TONSILLECTOMY      No family history on file.  Social History   Tobacco Use  . Smoking status: Never  . Smokeless tobacco: Never  Vaping Use  . Vaping Use: Never used  Substance Use Topics  . Alcohol use: No  . Drug use: No    Prior to Admission medications   Medication Sig Start Date End Date Taking? Authorizing Provider  amLODipine-benazepril (LOTREL) 5-20 MG per capsule Take 1 capsule by mouth daily.    [provider]  aspirin EC 81 MG tablet Take 1 tablet (81 mg total) by mouth daily. Swallow whole. 01/05/21   Zannie Cove, MD  atorvastatin (LIPITOR) 40 MG tablet Take 1 tablet (40 mg total) by mouth daily. 02/08/21   Ihor Austin, NP  KAZANO 12.5-500 MG TABS Take 1 tablet by mouth 2 (two) times daily. 04/21/20   [provider]  Multiple Vitamin (MULTIVITAMIN WITH MINERALS) TABS tablet Take 1 tablet by mouth daily. Dr. Linton Flemings Vitamins.    [provider]  propranolol (INDERAL) 10 MG tablet Take 0.5 tablets (5 mg total) by mouth 2 (two)  times daily. 01/05/21   Zannie Cove, MD    Allergies Doxycycline hyclate, Levofloxacin, Meloxicam, Naproxen-esomeprazole mg, Nitrofurantoin, Other, and Pneumococcal polysaccharide vaccine   REVIEW OF SYSTEMS  Negative except as noted here or in the History of Present Illness.   PHYSICAL EXAMINATION  Initial Vital Signs Blood pressure (!) 174/77, pulse (!) 55, temperature 98 F (36.7 C), temperature source Oral, resp. rate 16, height 5\' 2"  (1.575 m), weight 43.1 kg, SpO2 97 %.  Examination General: Well-developed, well-nourished female in no acute distress; appearance consistent with age of record HENT: normocephalic; superficial abrasions of eyebrows Eyes: pupils equal, round and reactive to light; extraocular muscles intact; bilateral pseudophakia Neck: supple; nontender Heart: regular rate and rhythm Lungs: clear to auscultation bilaterally Abdomen: soft; nondistended; nontender; bowel sounds present Extremities: No acute deformity; arthritic changes; pain on passive movement of shoulders Neurologic: Awake, alert and oriented; motor function intact in all extremities and symmetric; no facial droop; hard of hearing Skin: Warm and dry; skin tear right proximal forearm Psychiatric: Normal mood and affect   RESULTS  Summary of this visit's results, reviewed and interpreted by myself:   EKG Interpretation  Date/Time:    Ventricular Rate:    PR Interval:    QRS Duration:   QT Interval:  QTC Calculation:   R Axis:     Text Interpretation:         Laboratory Studies: No results found for this or any previous visit (from the past 24 hour(s)). Imaging Studies: No results found.  ED COURSE and MDM  Nursing notes, initial and subsequent vitals signs, including pulse oximetry, reviewed and interpreted by myself.  Vitals:   07/16/21 0009 07/16/21 0010  BP: (!) 174/77   Pulse: (!) 55   Resp: 16   Temp: 98 F (36.7 C)   TempSrc: Oral   SpO2: 97%   Weight:  43.1  kg  Height:  5\' 2"  (1.575 m)   Medications  Tdap (BOOSTRIX) injection 0.5 mL (has no administration in time range)    I do not believe any radiographs are indicated at this time.  The wounds to her eyebrows are superficial and the skin tear to her elbow is small (about 2 x 2 cm).  PROCEDURES  Procedures   ED DIAGNOSES     ICD-10-CM   1. Fall in home, initial encounter  W19.XXXA    Y92.009     2. Skin tear of right elbow without complication, initial encounter  S51.011A     3. Abrasion of left eyebrow, initial encounter  S00.212A     4. Abrasion of right eyebrow, initial encounter  [birth year]1974          Z61.096E, MD 07/16/21 [phone]

## 2021-07-16 NOTE — ED Triage Notes (Signed)
C/o fall from standing getting up from sofa x 2 hrs ago, landing on carpet floor , skin tear to right arm , hit face on floor

## 2021-11-02 ENCOUNTER — Other Ambulatory Visit: Payer: Self-pay

## 2021-11-02 DIAGNOSIS — R1030 Lower abdominal pain, unspecified: Secondary | ICD-10-CM

## 2021-11-11 ENCOUNTER — Ambulatory Visit
Admission: RE | Admit: 2021-11-11 | Discharge: 2021-11-11 | Disposition: A | Discharge status: Home / Self Care | Payer: Medicare Other | Source: Ambulatory Visit | Attending: Gastroenterology | Admitting: Gastroenterology

## 2021-11-11 DIAGNOSIS — R1030 Lower abdominal pain, unspecified: Secondary | ICD-10-CM

## 2021-11-22 ENCOUNTER — Other Ambulatory Visit: Payer: Self-pay | Admitting: Family

## 2021-11-22 ENCOUNTER — Other Ambulatory Visit: Payer: Self-pay

## 2021-11-22 ENCOUNTER — Other Ambulatory Visit: Payer: Self-pay | Admitting: Gastroenterology

## 2021-11-22 DIAGNOSIS — R109 Unspecified abdominal pain: Secondary | ICD-10-CM

## 2021-11-24 ENCOUNTER — Emergency Department (HOSPITAL_BASED_OUTPATIENT_CLINIC_OR_DEPARTMENT_OTHER): Payer: Medicare Other

## 2021-11-24 ENCOUNTER — Encounter (HOSPITAL_BASED_OUTPATIENT_CLINIC_OR_DEPARTMENT_OTHER): Payer: Self-pay | Admitting: Emergency Medicine

## 2021-11-24 ENCOUNTER — Emergency Department (HOSPITAL_BASED_OUTPATIENT_CLINIC_OR_DEPARTMENT_OTHER)
Admission: EM | Admit: 2021-11-24 | Discharge: 2021-11-24 | Disposition: A | Discharge status: Home / Self Care | Payer: Medicare Other | Attending: Emergency Medicine | Admitting: Emergency Medicine

## 2021-11-24 ENCOUNTER — Other Ambulatory Visit: Payer: Self-pay

## 2021-11-24 DIAGNOSIS — R072 Precordial pain: Secondary | ICD-10-CM | POA: Insufficient documentation

## 2021-11-24 DIAGNOSIS — F419 Anxiety disorder, unspecified: Secondary | ICD-10-CM | POA: Diagnosis not present

## 2021-11-24 DIAGNOSIS — Z79899 Other long term (current) drug therapy: Secondary | ICD-10-CM | POA: Insufficient documentation

## 2021-11-24 DIAGNOSIS — I10 Essential (primary) hypertension: Secondary | ICD-10-CM | POA: Diagnosis not present

## 2021-11-24 LAB — CBC WITH DIFFERENTIAL/PLATELET
Abs Immature Granulocytes: 0.06 10*3/uL (ref 0.00–0.07)
Basophils Absolute: 0 10*3/uL (ref 0.0–0.1)
Basophils Relative: 0 %
Eosinophils Absolute: 0.2 10*3/uL (ref 0.0–0.5)
Eosinophils Relative: 2 %
HCT: 37.4 % (ref 36.0–46.0)
Hemoglobin: 12.4 g/dL (ref 12.0–15.0)
Immature Granulocytes: 1 %
Lymphocytes Relative: 27 %
Lymphs Abs: 2.4 10*3/uL (ref 0.7–4.0)
MCH: 29.2 pg (ref 26.0–34.0)
MCHC: 33.2 g/dL (ref 30.0–36.0)
MCV: 88 fL (ref 80.0–100.0)
Monocytes Absolute: 0.8 10*3/uL (ref 0.1–1.0)
Monocytes Relative: 9 %
Neutro Abs: 5.4 10*3/uL (ref 1.7–7.7)
Neutrophils Relative %: 61 %
Platelets: 279 10*3/uL (ref 150–400)
RBC: 4.25 MIL/uL (ref 3.87–5.11)
RDW: 15.2 % (ref 11.5–15.5)
WBC: 8.9 10*3/uL (ref 4.0–10.5)
nRBC: 0 % (ref 0.0–0.2)

## 2021-11-24 LAB — BASIC METABOLIC PANEL
Anion gap: 7 (ref 5–15)
BUN: 39 mg/dL — ABNORMAL HIGH (ref 8–23)
CO2: 25 mmol/L (ref 22–32)
Calcium: 9.4 mg/dL (ref 8.9–10.3)
Chloride: 102 mmol/L (ref 98–111)
Creatinine, Ser: 0.82 mg/dL (ref 0.44–1.00)
GFR, Estimated: >60 mL/min (ref >60)
Glucose, Bld: 192 mg/dL — ABNORMAL HIGH (ref 70–99)
Potassium: 4.1 mmol/L (ref 3.5–5.1)
Sodium: 134 mmol/L — ABNORMAL LOW (ref 135–145)

## 2021-11-24 LAB — TROPONIN I (HIGH SENSITIVITY)
Troponin I (High Sensitivity): 5 ng/L (ref <18)
Troponin I (High Sensitivity): 5 ng/L (ref <18)

## 2021-11-24 MED ORDER — ALUM & MAG HYDROXIDE-SIMETH 200-200-20 MG/5ML PO SUSP
30.0000 mL | Freq: Once | ORAL | Status: AC
  Administered 2021-11-24: 30 mL via ORAL
  Filled 2021-11-24: qty 30

## 2021-11-24 NOTE — ED Notes (Signed)
Written and verbal inst to pt and her son  Verbalized an understanding  To home with son

## 2021-11-24 NOTE — ED Notes (Signed)
X-ray at bedside

## 2021-11-24 NOTE — ED Provider Notes (Signed)
[facility] EMERGENCY DEPARTMENT Provider Note   CSN: [id number] Arrival date & time: 11/24/21  0138     History  Chief Complaint  Patient presents with   Chest Pain    Kiara Blair is a 88 y.o. female.  The history is provided by the patient.  Chest Pain Pain location:  Substernal area Pain quality comment:  Told nurse tightness, but cannot tell me the type of pain states she forgot.  She also forgot the duration of pain but son states she told him 10 minutes Pain radiates to:  Does not radiate Pain severity:  Moderate Onset quality:  Sudden Duration: minutes, possibly 10 minutes. Chronicity:  New Context: at rest   Relieved by:  Nothing Worsened by:  Nothing Ineffective treatments:  None tried Associated symptoms: anxiety   Associated symptoms: no abdominal pain, no anorexia, no back pain, no cough, no fever, no lower extremity edema, no nausea, no orthopnea, no palpitations, no shortness of breath, no syncope and no vomiting   Risk factors: diabetes mellitus   Risk factors: not female       Home Medications Prior to Admission medications   Medication Sig Start Date End Date Taking? Authorizing Provider  amLODipine-benazepril (LOTREL) 5-20 MG per capsule Take 1 capsule by mouth daily.    [provider]  aspirin EC 81 MG tablet Take 1 tablet (81 mg total) by mouth daily. Swallow whole. 01/05/21   Zannie CoveJoseph, Preetha, MD  atorvastatin (LIPITOR) 40 MG tablet Take 1 tablet (40 mg total) by mouth daily. 02/08/21   Ihor AustinMcCue, Jessica, NP  KAZANO 12.5-500 MG TABS Take 1 tablet by mouth 2 (two) times daily. 04/21/20   [provider]  Multiple Vitamin (MULTIVITAMIN WITH MINERALS) TABS tablet Take 1 tablet by mouth daily. Dr. Linton FlemingsWhitakers Vitamins.    [provider]  propranolol (INDERAL) 10 MG tablet Take 0.5 tablets (5 mg total) by mouth 2 (two) times daily. 01/05/21   Zannie CoveJoseph, Preetha, MD      Allergies    Doxycycline hyclate, Levofloxacin, Meloxicam,  Naproxen-esomeprazole mg, Nitrofurantoin, Other, and Pneumococcal polysaccharide vaccine    Review of Systems   Review of Systems  Constitutional:  Negative for fever.  HENT:  Negative for facial swelling.   Eyes:  Negative for redness.  Respiratory:  Negative for cough and shortness of breath.   Cardiovascular:  Positive for chest pain. Negative for palpitations, orthopnea and syncope.  Gastrointestinal:  Negative for abdominal pain, anorexia, nausea and vomiting.  Genitourinary:  Negative for difficulty urinating.  Musculoskeletal:  Negative for back pain.  Neurological:  Negative for facial asymmetry.  Psychiatric/Behavioral:  Negative for agitation.   All other systems reviewed and are negative.  Physical Exam Updated Vital Signs BP (!) 170/61 (BP Location: Left Arm)    Pulse 62    Temp 98.1 F (36.7 C) (Oral)    Resp 18    Ht 5\' 2"  (1.575 m)    Wt 48.9 kg    SpO2 97%    BMI 19.70 kg/m  Physical Exam Vitals and nursing note reviewed. Exam conducted with a chaperone present.  Constitutional:      General: She is not in acute distress.    Appearance: Normal appearance.  HENT:     Head: Normocephalic and atraumatic.     Nose: Nose normal.  Eyes:     Conjunctiva/sclera: Conjunctivae normal.     Pupils: Pupils are equal, round, and reactive to light.  Cardiovascular:     Rate and  Rhythm: Normal rate and regular rhythm.     Pulses: Normal pulses.     Heart sounds: Normal heart sounds.  Pulmonary:     Effort: Pulmonary effort is normal.     Breath sounds: Normal breath sounds.  Abdominal:     General: Abdomen is flat. Bowel sounds are normal.     Palpations: Abdomen is soft.     Tenderness: There is no abdominal tenderness. There is no guarding.  Musculoskeletal:        General: Normal range of motion.     Cervical back: Normal range of motion and neck supple.     Right lower leg: No edema.     Left lower leg: No edema.  Skin:    General: Skin is warm and dry.      Capillary Refill: Capillary refill takes less than 2 seconds.  Neurological:     General: No focal deficit present.     Mental Status: She is alert.     Deep Tendon Reflexes: Reflexes normal.  Psychiatric:        Mood and Affect: Mood normal.        Behavior: Behavior normal.    ED Results / Procedures / Treatments   Labs (all labs ordered are listed, but only abnormal results are displayed) Results for orders placed or performed during the hospital encounter of 11/24/21  CBC with Differential/Platelet  Result Value Ref Range   WBC 8.9 4.0 - 10.5 K/uL   RBC 4.25 3.87 - 5.11 MIL/uL   Hemoglobin 12.4 12.0 - 15.0 g/dL   HCT 84.1 32.4 - 40.1 %   MCV 88.0 80.0 - 100.0 fL   MCH 29.2 26.0 - 34.0 pg   MCHC 33.2 30.0 - 36.0 g/dL   RDW 02.7 25.3 - 66.4 %   Platelets 279 150 - 400 K/uL   nRBC 0.0 0.0 - 0.2 %   Neutrophils Relative % 61 %   Neutro Abs 5.4 1.7 - 7.7 K/uL   Lymphocytes Relative 27 %   Lymphs Abs 2.4 0.7 - 4.0 K/uL   Monocytes Relative 9 %   Monocytes Absolute 0.8 0.1 - 1.0 K/uL   Eosinophils Relative 2 %   Eosinophils Absolute 0.2 0.0 - 0.5 K/uL   Basophils Relative 0 %   Basophils Absolute 0.0 0.0 - 0.1 K/uL   Immature Granulocytes 1 %   Abs Immature Granulocytes 0.06 0.00 - 0.07 K/uL  Troponin I (High Sensitivity)  Result Value Ref Range   Troponin I (High Sensitivity) 5 <18 ng/L   US Abdomen Complete  Result Date: 11/13/2021 CLINICAL DATA:  Abdominal pain and history of prior cholecystectomy. EXAM: ABDOMEN ULTRASOUND COMPLETE COMPARISON:  Prior CT of the abdomen on 06/09/2017 FINDINGS: Gallbladder: Surgically absent. Common bile duct: Diameter: 3 mm Liver: Increased parenchymal echogenicity and heterogeneity is likely consistent with underlying hepatic steatosis. Portal vein is patent on color Doppler imaging with normal direction of blood flow towards the liver. IVC: No abnormality visualized. Pancreas: Visualized portion unremarkable. Spleen: The spleen was not  visualized by ultrasound due to high positioning and obscuration by bowel gas and likely adjacent lung. Right Kidney: Length: 10.5 cm. Echogenicity within normal limits. No mass or hydronephrosis visualized. Left Kidney: Length: 10.4 cm. Echogenicity within normal limits. No mass or hydronephrosis visualized. Abdominal aorta: Atherosclerosis of the abdominal aorta without visualized aneurysm. Other findings: No free fluid identified. IMPRESSION: 1. Evidence of underlying hepatic steatosis. 2. Aortic atherosclerosis. 3. Nonvisualization of the spleen, likely due to  high positioning. The spleen appeared unremarkable on the prior CT in 2018. Electronically Signed   By: Irish Lack M.D.   On: 11/13/2021 09:56   DG Chest Portable 1 View  Result Date: 11/24/2021 CLINICAL DATA:  Chest pain. EXAM: PORTABLE CHEST 1 VIEW COMPARISON:  Chest radiograph dated 01/03/2021. FINDINGS: No focal consolidation, pleural effusion, pneumothorax. Mild cardiomegaly. Atherosclerotic calcification of the aorta. No acute osseous pathology. IMPRESSION: No active cardiopulmonary disease. Electronically Signed   By: Elgie Collard M.D.   On: 11/24/2021 02:05     EKG EKG Interpretation  Date/Time:  Thursday November 24 2021 01:46:59 EST Ventricular Rate:  61 PR Interval:  139 QRS Duration: 98 QT Interval:  415 QTC Calculation: 418 R Axis:   66 Text Interpretation: Sinus rhythm Confirmed by Jerica Creegan ([id number]) on 11/24/2021 1:50:37 AM  Radiology DG Chest Portable 1 View  Result Date: 11/24/2021 CLINICAL DATA:  Chest pain. EXAM: PORTABLE CHEST 1 VIEW COMPARISON:  Chest radiograph dated 01/03/2021. FINDINGS: No focal consolidation, pleural effusion, pneumothorax. Mild cardiomegaly. Atherosclerotic calcification of the aorta. No acute osseous pathology. IMPRESSION: No active cardiopulmonary disease. Electronically Signed   By: Elgie Collard M.D.   On: 11/24/2021 02:05    Procedures Procedures    Medications  Ordered in ED Medications  alum & mag hydroxide-simeth (MAALOX/MYLANTA) 200-200-20 MG/5ML suspension 30 mL (30 mLs Oral Given 11/24/21 0157)    ED Course/ Medical Decision Making/ A&P                           Medical Decision Making Per son 10 minutes of chest pain at rest without associated symptoms.  No f/c/r.  No exertional symptoms   Amount and/or Complexity of Data Reviewed Independent Historian:     Details: son, see above External Data Reviewed: labs and notes.    Details: I have reviewed labs from OSH and there was an elevated hemoglobin A1c of 9.2. I have reviewed outpatient endocrinology notes Labs: ordered.    Details: Labs reviewed by me: CBC reviewed is normal and troponin is 5 and also normal.  Glucose slightly elevated at 192 on electrolytes normal sodium potassium and creatinine Radiology: ordered and independent interpretation performed.    Details: i have reviewed CXR: No pneumonia or acute finding by me.  This interpretation concurs with radiology reading. ECG/medicine tests: ordered and independent interpretation performed.    Details: NSR by me without acute findings  Risk OTC drugs. Decision regarding hospitalization. Risk Details: Differential:ACS, GERD, MSK pain in the chest.  Given age I have considered hospitalization but patient is well appearing with normal exam and normal EKG and labwork.  Ruled out for MI with negative EKG and 2 negative troponins.  Very well appearing.  I do not believe this is PE.  The pain is not pleuritic and there is no SOB, no hemoptysis and no leg pain.  No estrogen or travel.  Stable for discharge with close follow up.      Final Clinical Impression(s) / ED Diagnoses Final diagnoses:  None   Return for intractable cough, coughing up blood, fevers > 100.4 unrelieved by medication, shortness of breath, intractable vomiting, chest pain, shortness of breath, weakness, numbness, changes in speech, facial asymmetry, abdominal pain,  passing out, Inability to tolerate liquids or food, cough, altered mental status or any concerns. No signs of systemic illness or infection. The patient is nontoxic-appearing on exam and vital signs are within normal limits.  I have  reviewed the triage vital signs and the nursing notes. Pertinent labs & imaging results that were available during my care of the patient were reviewed by me and considered in my medical decision making (see chart for details). After history, exam, and medical workup I feel the patient has been appropriately medically screened and is safe for discharge home. Pertinent diagnoses were discussed with the patient. Patient was given return precautions. Rx / DC Orders ED Discharge Orders     None         Tynesha Free, MD 11/24/21 2993

## 2021-11-24 NOTE — ED Notes (Signed)
Pt ambulated to the restroom and back with minimal assist  Tolerated well

## 2021-11-24 NOTE — ED Triage Notes (Signed)
Pt states she had an episode tonight where her chest got really tight  States it was about 11 pm and lasted a good 10 minutes   Denies any pain at this time  Pt is also c/o edema in her lower extremities that she states has been there for quite some time

## 2021-12-05 ENCOUNTER — Emergency Department (HOSPITAL_COMMUNITY)
Admission: EM | Admit: 2021-12-05 | Discharge: 2021-12-06 | Disposition: A | Discharge status: Home / Self Care | Payer: Medicare Other | Attending: Emergency Medicine | Admitting: Emergency Medicine

## 2021-12-05 ENCOUNTER — Emergency Department (HOSPITAL_COMMUNITY): Payer: Medicare Other

## 2021-12-05 DIAGNOSIS — Z7982 Long term (current) use of aspirin: Secondary | ICD-10-CM | POA: Diagnosis not present

## 2021-12-05 DIAGNOSIS — G8911 Acute pain due to trauma: Secondary | ICD-10-CM | POA: Insufficient documentation

## 2021-12-05 DIAGNOSIS — W11XXXA Fall on and from ladder, initial encounter: Secondary | ICD-10-CM | POA: Insufficient documentation

## 2021-12-05 DIAGNOSIS — W19XXXA Unspecified fall, initial encounter: Secondary | ICD-10-CM

## 2021-12-05 DIAGNOSIS — E119 Type 2 diabetes mellitus without complications: Secondary | ICD-10-CM | POA: Diagnosis not present

## 2021-12-05 DIAGNOSIS — Z79899 Other long term (current) drug therapy: Secondary | ICD-10-CM | POA: Insufficient documentation

## 2021-12-05 DIAGNOSIS — M542 Cervicalgia: Secondary | ICD-10-CM | POA: Diagnosis not present

## 2021-12-05 DIAGNOSIS — I10 Essential (primary) hypertension: Secondary | ICD-10-CM | POA: Diagnosis not present

## 2021-12-05 DIAGNOSIS — Z20822 Contact with and (suspected) exposure to covid-19: Secondary | ICD-10-CM | POA: Insufficient documentation

## 2021-12-05 DIAGNOSIS — M545 Low back pain, unspecified: Secondary | ICD-10-CM

## 2021-12-05 DIAGNOSIS — R519 Headache, unspecified: Secondary | ICD-10-CM | POA: Diagnosis not present

## 2021-12-05 LAB — CBC WITH DIFFERENTIAL/PLATELET
Abs Immature Granulocytes: 0.06 10*3/uL (ref 0.00–0.07)
Basophils Absolute: 0 10*3/uL (ref 0.0–0.1)
Basophils Relative: 0 %
Eosinophils Absolute: 0.2 10*3/uL (ref 0.0–0.5)
Eosinophils Relative: 2 %
HCT: 36.1 % (ref 36.0–46.0)
Hemoglobin: 12 g/dL (ref 12.0–15.0)
Immature Granulocytes: 1 %
Lymphocytes Relative: 18 %
Lymphs Abs: 1.8 10*3/uL (ref 0.7–4.0)
MCH: 29.1 pg (ref 26.0–34.0)
MCHC: 33.2 g/dL (ref 30.0–36.0)
MCV: 87.4 fL (ref 80.0–100.0)
Monocytes Absolute: 0.8 10*3/uL (ref 0.1–1.0)
Monocytes Relative: 8 %
Neutro Abs: 7.2 10*3/uL (ref 1.7–7.7)
Neutrophils Relative %: 71 %
Platelets: 254 10*3/uL (ref 150–400)
RBC: 4.13 MIL/uL (ref 3.87–5.11)
RDW: 14.9 % (ref 11.5–15.5)
WBC: 10 10*3/uL (ref 4.0–10.5)
nRBC: 0 % (ref 0.0–0.2)

## 2021-12-05 LAB — COMPREHENSIVE METABOLIC PANEL
ALT: 24 U/L (ref 0–44)
AST: 32 U/L (ref 15–41)
Albumin: 3.5 g/dL (ref 3.5–5.0)
Alkaline Phosphatase: 98 U/L (ref 38–126)
Anion gap: 8 (ref 5–15)
BUN: 26 mg/dL — ABNORMAL HIGH (ref 8–23)
CO2: 26 mmol/L (ref 22–32)
Calcium: 9.5 mg/dL (ref 8.9–10.3)
Chloride: 102 mmol/L (ref 98–111)
Creatinine, Ser: 0.73 mg/dL (ref 0.44–1.00)
GFR, Estimated: >60 mL/min (ref >60)
Glucose, Bld: 148 mg/dL — ABNORMAL HIGH (ref 70–99)
Potassium: 4.1 mmol/L (ref 3.5–5.1)
Sodium: 136 mmol/L (ref 135–145)
Total Bilirubin: 0.6 mg/dL (ref 0.3–1.2)
Total Protein: 6 g/dL — ABNORMAL LOW (ref 6.5–8.1)

## 2021-12-05 LAB — CBG MONITORING, ED: Glucose-Capillary: 142 mg/dL — ABNORMAL HIGH (ref 70–99)

## 2021-12-05 LAB — RESP PANEL BY RT-PCR (FLU A&B, COVID) ARPGX2
Influenza A by PCR: NEGATIVE
Influenza B by PCR: NEGATIVE
SARS Coronavirus 2 by RT PCR: NEGATIVE

## 2021-12-05 LAB — CK: Total CK: 124 U/L (ref 38–234)

## 2021-12-05 LAB — TROPONIN I (HIGH SENSITIVITY): Troponin I (High Sensitivity): 10 ng/L (ref <18)

## 2021-12-05 NOTE — ED Provider Notes (Signed)
[facility] EMERGENCY DEPARTMENT Provider Note   CSN: [id number] Arrival date & time: 12/05/21  2039     History  Chief Complaint  Patient presents with   Kiara Blair is a 88 y.o. female.  This is a 88 y.o. fem  with significant medical history as below, including htn, hld, dm who presents to the ED with complaint of fall  88 year old female with history of hypertension, hyperlipidemia, diabetes.  Presents to emergency room secondary to fall.  Patient reports using a stepladder in her kitchen attempting to collect some supplies to help clean when she fell from the stepladder.  Unsure if she hit her head.  Fell onto her back.  Unsure of LOC.  No thinners.  She has back pain and neck pain.  Refused c-collar by EMS.  Lives alone.  She was on the ground unable to get up.  Unsure of downtime.  Patient approximates 2 to 3 hours, but she is unsure.      Past Medical History: No date: Diabetes mellitus without complication (HCC) No date: Hypercholesteremia No date: Hypertension No date: Tremor     Comment:  right arm  Past Surgical History: No date: ABDOMINAL HYSTERECTOMY No date: APPENDECTOMY No date: BLADDER SUSPENSION No date: CHOLECYSTECTOMY No date: EYE SURGERY No date: TONSILLECTOMY    The history is provided by the patient and the EMS personnel.  Fall Associated symptoms include headaches. Pertinent negatives include no chest pain, no abdominal pain and no shortness of breath.      Home Medications Prior to Admission medications   Medication Sig Start Date End Date Taking? Authorizing Provider  acetaminophen (TYLENOL) 325 MG tablet Take 2 tablets (650 mg total) by mouth every 6 (six) hours as needed. 12/06/21  Yes Wynona Dove A, DO  lidocaine (LIDODERM) 5 % Place 1 patch onto the skin daily as needed. Remove & Discard patch within 12 hours or as directed by MD 12/06/21  Yes Wynona Dove A, DO  amLODipine-benazepril (LOTREL) 5-20 MG per  capsule Take 1 capsule by mouth daily.    [provider]  aspirin EC 81 MG tablet Take 1 tablet (81 mg total) by mouth daily. Swallow whole. 01/05/21   Domenic Polite, MD  atorvastatin (LIPITOR) 40 MG tablet Take 1 tablet (40 mg total) by mouth daily. 02/08/21   Frann Rider, NP  KAZANO 12.5-500 MG TABS Take 1 tablet by mouth 2 (two) times daily. 04/21/20   [provider]  Multiple Vitamin (MULTIVITAMIN WITH MINERALS) TABS tablet Take 1 tablet by mouth daily. Dr. Lenon Oms Vitamins.    [provider]  propranolol (INDERAL) 10 MG tablet Take 0.5 tablets (5 mg total) by mouth 2 (two) times daily. 01/05/21   Domenic Polite, MD      Allergies    Doxycycline hyclate, Levofloxacin, Meloxicam, Naproxen-esomeprazole mg, Nitrofurantoin, Other, and Pneumococcal polysaccharide vaccine    Review of Systems   Review of Systems  Constitutional:  Negative for chills and fever.  HENT:  Negative for facial swelling and trouble swallowing.   Eyes:  Negative for photophobia and visual disturbance.  Respiratory:  Negative for cough and shortness of breath.   Cardiovascular:  Negative for chest pain and palpitations.  Gastrointestinal:  Negative for abdominal pain, nausea and vomiting.  Endocrine: Negative for polydipsia and polyuria.  Genitourinary:  Negative for difficulty urinating and hematuria.  Musculoskeletal:  Positive for back pain and gait problem. Negative for joint swelling.  Skin:  Negative for  pallor and rash.  Neurological:  Positive for syncope and headaches.  Psychiatric/Behavioral:  Negative for agitation and confusion.    Physical Exam Updated Vital Signs BP (!) 141/59    Pulse 63    Temp 98.1 F (36.7 C) (Oral)    Resp 18    SpO2 99%  Physical Exam Vitals and nursing note reviewed.  Constitutional:      General: She is not in acute distress.    Appearance: Normal appearance.  HENT:     Head: Normocephalic and atraumatic. No raccoon eyes, Battle's sign,  right periorbital erythema or left periorbital erythema.     Right Ear: External ear normal.     Left Ear: External ear normal.     Nose: Nose normal.     Mouth/Throat:     Mouth: Mucous membranes are moist.  Eyes:     General: No scleral icterus.       Right eye: No discharge.        Left eye: No discharge.     Extraocular Movements: Extraocular movements intact.     Pupils: Pupils are equal, round, and reactive to light.  Cardiovascular:     Rate and Rhythm: Normal rate and regular rhythm.     Pulses: Normal pulses.          Radial pulses are 2+ on the right side and 2+ on the left side.       Posterior tibial pulses are 2+ on the right side and 2+ on the left side.     Heart sounds: Normal heart sounds.  Pulmonary:     Effort: Pulmonary effort is normal. No respiratory distress.     Breath sounds: Normal breath sounds.  Abdominal:     General: Abdomen is flat.     Palpations: Abdomen is soft.     Tenderness: There is no abdominal tenderness.  Musculoskeletal:        General: Normal range of motion.     Cervical back: Normal range of motion.     Right lower leg: No edema.     Left lower leg: No edema.  Skin:    General: Skin is warm and dry.     Capillary Refill: Capillary refill takes less than 2 seconds.  Neurological:     Mental Status: She is alert and oriented to person, place, and time.     GCS: GCS eye subscore is 4. GCS verbal subscore is 5. GCS motor subscore is 6.     Cranial Nerves: Cranial nerves 2-12 are intact. No dysarthria.     Sensory: Sensation is intact.     Motor: Motor function is intact.     Coordination: Coordination is intact.  Psychiatric:        Mood and Affect: Mood normal.        Behavior: Behavior normal.    ED Results / Procedures / Treatments   Labs (all labs ordered are listed, but only abnormal results are displayed) Labs Reviewed  COMPREHENSIVE METABOLIC PANEL - Abnormal; Notable for the following components:      Result Value    Glucose, Bld 148 (*)    BUN 26 (*)    Total Protein 6.0 (*)    All other components within normal limits  CBG MONITORING, ED - Abnormal; Notable for the following components:   Glucose-Capillary 142 (*)    All other components within normal limits  RESP PANEL BY RT-PCR (FLU A&B, COVID) [id number]  CBC WITH DIFFERENTIAL/PLATELET  CK  TROPONIN I (HIGH SENSITIVITY)  TROPONIN I (HIGH SENSITIVITY)    EKG None  Radiology DG Pelvis 1-2 Views  Result Date: 12/05/2021 CLINICAL DATA:  Fall. EXAM: PELVIS - 1-2 VIEW COMPARISON:  None. FINDINGS: A 7 mm bony density is seen along the lateral aspect of the right femoral head. No definite donor site is identified. The remaining bony structures appear intact. There is no dislocation. Degenerative changes are present in the lumbar spine and bilateral hips. Surgical clips are present in the right upper quadrant. IMPRESSION: 7 mm bony density along the lateral aspect of the right femoral head. No definite donor site is identified. Correlation with physical exam is recommended to exclude acute fracture. Electronically Signed   By: Brett Fairy M.D.   On: 12/05/2021 21:49   CT Head Wo Contrast  Result Date: 12/05/2021 CLINICAL DATA:  Head trauma, minor (Age >= 65y) EXAM: CT HEAD WITHOUT CONTRAST TECHNIQUE: Contiguous axial images were obtained from the base of the skull through the vertex without intravenous contrast. RADIATION DOSE REDUCTION: This exam was performed according to the departmental dose-optimization program which includes automated exposure control, adjustment of the mA and/or kV according to patient size and/or use of iterative reconstruction technique. COMPARISON:  None. BRAIN: BRAIN Cerebral ventricle sizes are concordant with the degree of cerebral volume loss. Patchy and confluent areas of decreased attenuation are noted throughout the deep and periventricular white matter of the cerebral hemispheres bilaterally, compatible with chronic  microvascular ischemic disease. No evidence of large-territorial acute infarction. No parenchymal hemorrhage. No mass lesion. No extra-axial collection. No mass effect or midline shift. No hydrocephalus. Basilar cisterns are patent. Vascular: No hyperdense vessel. Atherosclerotic calcifications are present within the cavernous internal carotid and vertebral arteries. Skull: No acute fracture or focal lesion. Sinuses/Orbits: Paranasal sinuses and mastoid air cells are clear. Bilateral lens replacement. Otherwise the orbits are unremarkable. Other: None. IMPRESSION: No acute intracranial abnormality. Electronically Signed   By: Iven Finn M.D.   On: 12/05/2021 23:41   CT Cervical Spine Wo Contrast  Result Date: 12/05/2021 CLINICAL DATA:  fall; trauma; trauma bakc pain EXAM: CT CERVICAL, THORACIC, AND LUMBAR SPINE WITHOUT CONTRAST TECHNIQUE: Multidetector CT imaging of the cervical, thoracic and lumbar spine was performed without intravenous contrast. Multiplanar CT image reconstructions were also generated. RADIATION DOSE REDUCTION: This exam was performed according to the departmental dose-optimization program which includes automated exposure control, adjustment of the mA and/or kV according to patient size and/or use of iterative reconstruction technique. COMPARISON:  CT abdomen pelvis 06/09/2017, CT head and cervical spine 04/26/2018 FINDINGS: CT CERVICAL SPINE FINDINGS Alignment: Stable grade 1 anterolisthesis C7 on T1. Skull base and vertebrae: Multilevel moderate degenerative changes spine with associated severe osseous neural foraminal stenosis of the bilateral, left greater than right, C5-C6 levels. No severe osseous central canal stenosis. No acute fracture. No aggressive appearing focal osseous lesion or focal pathologic process. Soft tissues and spinal canal: No prevertebral fluid or swelling. No visible canal hematoma. Upper chest: Unremarkable. Other: None. CT THORACIC SPINE FINDINGS Alignment:  Normal. Vertebrae: Multilevel moderate degenerative changes spine. No acute fracture or focal pathologic process. Paraspinal and other soft tissues: Negative. Disc levels: Mild narrowing with lower thoracic spine demonstrating intervertebral disc space vacuum phenomenon. CT LUMBAR SPINE FINDINGS Segmentation: 5 lumbar type vertebrae. Alignment: Normal. Vertebrae: Diffusely decreased bone density. Multilevel moderate to severe degenerative changes of the spine with facet arthropathy, osteophyte formation and posterior disc osteophyte complex formation. Endplate sclerosis at the L1-L2 level. Chronic stable vague  anterior wedge compression deformity of the L1 vertebral body. No acute fracture or focal pathologic process. Paraspinal and other soft tissues: Negative. Disc levels: Multilevel intervertebral disc space vacuum phenomenon. Other Partially visualized enlarged heterogeneous right thyroid gland. This has been evaluated on previous imaging. (ref: J Am Coll Radiol. 2015 Feb;12(2): 143-50). Bilateral shoulder degenerative changes. At least moderate volume hiatal hernia. Status post cholecystectomy. Atherosclerotic plaque. At least 3 vessel coronary calcification. IMPRESSION: 1. No acute displaced fracture or traumatic listhesis of the cervical spine. 2. Multilevel moderate degenerative changes spine with associated severe osseous neural foraminal stenosis at the bilateral C5-C6 level. 3. No acute displaced fracture or traumatic listhesis of the thoracic spine. 4. No acute displaced fracture or traumatic listhesis of the lumbar spine. 5. Other imaging findings of potential clinical significance: Moderate volume hiatal hernia. Aortic Atherosclerosis (ICD10-I70.0). Electronically Signed   By: Iven Finn M.D.   On: 12/05/2021 23:52   CT Thoracic Spine Wo Contrast  Result Date: 12/05/2021 CLINICAL DATA:  fall; trauma; trauma bakc pain EXAM: CT CERVICAL, THORACIC, AND LUMBAR SPINE WITHOUT CONTRAST TECHNIQUE:  Multidetector CT imaging of the cervical, thoracic and lumbar spine was performed without intravenous contrast. Multiplanar CT image reconstructions were also generated. RADIATION DOSE REDUCTION: This exam was performed according to the departmental dose-optimization program which includes automated exposure control, adjustment of the mA and/or kV according to patient size and/or use of iterative reconstruction technique. COMPARISON:  CT abdomen pelvis 06/09/2017, CT head and cervical spine 04/26/2018 FINDINGS: CT CERVICAL SPINE FINDINGS Alignment: Stable grade 1 anterolisthesis C7 on T1. Skull base and vertebrae: Multilevel moderate degenerative changes spine with associated severe osseous neural foraminal stenosis of the bilateral, left greater than right, C5-C6 levels. No severe osseous central canal stenosis. No acute fracture. No aggressive appearing focal osseous lesion or focal pathologic process. Soft tissues and spinal canal: No prevertebral fluid or swelling. No visible canal hematoma. Upper chest: Unremarkable. Other: None. CT THORACIC SPINE FINDINGS Alignment: Normal. Vertebrae: Multilevel moderate degenerative changes spine. No acute fracture or focal pathologic process. Paraspinal and other soft tissues: Negative. Disc levels: Mild narrowing with lower thoracic spine demonstrating intervertebral disc space vacuum phenomenon. CT LUMBAR SPINE FINDINGS Segmentation: 5 lumbar type vertebrae. Alignment: Normal. Vertebrae: Diffusely decreased bone density. Multilevel moderate to severe degenerative changes of the spine with facet arthropathy, osteophyte formation and posterior disc osteophyte complex formation. Endplate sclerosis at the L1-L2 level. Chronic stable vague anterior wedge compression deformity of the L1 vertebral body. No acute fracture or focal pathologic process. Paraspinal and other soft tissues: Negative. Disc levels: Multilevel intervertebral disc space vacuum phenomenon. Other Partially  visualized enlarged heterogeneous right thyroid gland. This has been evaluated on previous imaging. (ref: J Am Coll Radiol. 2015 Feb;12(2): 143-50). Bilateral shoulder degenerative changes. At least moderate volume hiatal hernia. Status post cholecystectomy. Atherosclerotic plaque. At least 3 vessel coronary calcification. IMPRESSION: 1. No acute displaced fracture or traumatic listhesis of the cervical spine. 2. Multilevel moderate degenerative changes spine with associated severe osseous neural foraminal stenosis at the bilateral C5-C6 level. 3. No acute displaced fracture or traumatic listhesis of the thoracic spine. 4. No acute displaced fracture or traumatic listhesis of the lumbar spine. 5. Other imaging findings of potential clinical significance: Moderate volume hiatal hernia. Aortic Atherosclerosis (ICD10-I70.0). Electronically Signed   By: Iven Finn M.D.   On: 12/05/2021 23:52   CT Lumbar Spine Wo Contrast  Result Date: 12/05/2021 CLINICAL DATA:  fall; trauma; trauma bakc pain EXAM: CT CERVICAL, THORACIC, AND LUMBAR  SPINE WITHOUT CONTRAST TECHNIQUE: Multidetector CT imaging of the cervical, thoracic and lumbar spine was performed without intravenous contrast. Multiplanar CT image reconstructions were also generated. RADIATION DOSE REDUCTION: This exam was performed according to the departmental dose-optimization program which includes automated exposure control, adjustment of the mA and/or kV according to patient size and/or use of iterative reconstruction technique. COMPARISON:  CT abdomen pelvis 06/09/2017, CT head and cervical spine 04/26/2018 FINDINGS: CT CERVICAL SPINE FINDINGS Alignment: Stable grade 1 anterolisthesis C7 on T1. Skull base and vertebrae: Multilevel moderate degenerative changes spine with associated severe osseous neural foraminal stenosis of the bilateral, left greater than right, C5-C6 levels. No severe osseous central canal stenosis. No acute fracture. No aggressive  appearing focal osseous lesion or focal pathologic process. Soft tissues and spinal canal: No prevertebral fluid or swelling. No visible canal hematoma. Upper chest: Unremarkable. Other: None. CT THORACIC SPINE FINDINGS Alignment: Normal. Vertebrae: Multilevel moderate degenerative changes spine. No acute fracture or focal pathologic process. Paraspinal and other soft tissues: Negative. Disc levels: Mild narrowing with lower thoracic spine demonstrating intervertebral disc space vacuum phenomenon. CT LUMBAR SPINE FINDINGS Segmentation: 5 lumbar type vertebrae. Alignment: Normal. Vertebrae: Diffusely decreased bone density. Multilevel moderate to severe degenerative changes of the spine with facet arthropathy, osteophyte formation and posterior disc osteophyte complex formation. Endplate sclerosis at the L1-L2 level. Chronic stable vague anterior wedge compression deformity of the L1 vertebral body. No acute fracture or focal pathologic process. Paraspinal and other soft tissues: Negative. Disc levels: Multilevel intervertebral disc space vacuum phenomenon. Other Partially visualized enlarged heterogeneous right thyroid gland. This has been evaluated on previous imaging. (ref: J Am Coll Radiol. 2015 Feb;12(2): 143-50). Bilateral shoulder degenerative changes. At least moderate volume hiatal hernia. Status post cholecystectomy. Atherosclerotic plaque. At least 3 vessel coronary calcification. IMPRESSION: 1. No acute displaced fracture or traumatic listhesis of the cervical spine. 2. Multilevel moderate degenerative changes spine with associated severe osseous neural foraminal stenosis at the bilateral C5-C6 level. 3. No acute displaced fracture or traumatic listhesis of the thoracic spine. 4. No acute displaced fracture or traumatic listhesis of the lumbar spine. 5. Other imaging findings of potential clinical significance: Moderate volume hiatal hernia. Aortic Atherosclerosis (ICD10-I70.0). Electronically Signed    By: Iven Finn M.D.   On: 12/05/2021 23:52   DG Chest Portable 1 View  Result Date: 12/05/2021 CLINICAL DATA:  Fall from chair. EXAM: PORTABLE CHEST 1 VIEW COMPARISON:  11/24/2021. FINDINGS: The heart size and mediastinal contours are within normal limits. There is atherosclerotic calcification of the aorta. No consolidation, effusion, or pneumothorax. Examination is limited due the left costophrenic angle being excluded from the field of view. No acute osseous abnormality. IMPRESSION: No acute cardiopulmonary disease. Electronically Signed   By: Brett Fairy M.D.   On: 12/05/2021 21:44    Procedures Procedures    Medications Ordered in ED Medications  lidocaine (LIDODERM) 5 % 1 patch (1 patch Transdermal Patch Applied 12/06/21 0054)  acetaminophen (TYLENOL) tablet 650 mg (650 mg Oral Given 12/06/21 0055)    ED Course/ Medical Decision Making/ A&P                           Medical Decision Making Amount and/or Complexity of Data Reviewed Independent Historian: caregiver External Data Reviewed: labs, radiology and notes. Labs: ordered. Decision-making details documented in ED Course. Radiology: ordered. Decision-making details documented in ED Course.  Risk OTC drugs. Prescription drug management. Decision regarding hospitalization.  CC: fall, back pain  This patient presents to the Emergency Department for the above complaint. This involves an extensive number of treatment options and is a complaint that carries with it a high risk of complications and morbidity. Vital signs were reviewed. Serious etiologies considered.  Record review:  Previous records obtained and reviewed   Additional history obtained from son at bedside  Medical and surgical history as noted above.   Work up as above, notable for:   Labs & imaging results that were available during my care of the patient were visualized by me and considered in my medical decision making.   I ordered imaging  studies which included CT head, cervical spine, thoracic and lumbar spine; chest and pelvis x-ray and I visualized the imaging and I agree with radiologist interpretation.  No fracture.  Abnormality noted on pelvis x-ray right femoral abnormality.  Patient is ambulatory.  No tenderness palpation to the right femur.  Low suspicion for fracture  Cardiac monitoring reviewed and interpreted personally which shows NSR  Social determinants of health include - N/a   Management: Lidocaine, Tylenol  Reassessment:  Patient reports is feeling better.  Ambulatory.  No nausea or vomiting.  Tolerant p.o.  Son will stay with her tonight.  Stable for discharge.   The patient improved significantly and was discharged in stable condition. Detailed discussions were had with the patient regarding current findings, and need for close f/u with PCP or on call doctor. The patient has been instructed to return immediately if the symptoms worsen in any way for re-evaluation. Patient verbalized understanding and is in agreement with current care plan. All questions answered prior to discharge.        This chart was dictated using voice recognition software.  Despite best efforts to proofread,  errors can occur which can change the documentation meaning.         Final Clinical Impression(s) / ED Diagnoses Final diagnoses:  Fall, initial encounter  Acute low back pain, unspecified back pain laterality, unspecified whether sciatica present    Rx / DC Orders ED Discharge Orders          Ordered    lidocaine (LIDODERM) 5 %  Daily PRN        12/06/21 0029    acetaminophen (TYLENOL) 325 MG tablet  Every 6 hours PRN        12/06/21 0029              Jeanell Sparrow, DO 12/06/21 0106

## 2021-12-05 NOTE — ED Notes (Signed)
Patient transported to CT 

## 2021-12-05 NOTE — ED Triage Notes (Signed)
Pt from home, fell from step stool, approx 5-55ft. Pt unsure if hit her head but -LOC, c/o mid back pain, bilateral shoulder/arm pain but reports that is normal. EMS reports she was on the ground for approx 2hrs PTA.   VSS, no line/no meds PTA

## 2021-12-06 DIAGNOSIS — M545 Low back pain, unspecified: Secondary | ICD-10-CM | POA: Diagnosis not present

## 2021-12-06 LAB — TROPONIN I (HIGH SENSITIVITY): Troponin I (High Sensitivity): 11 ng/L (ref <18)

## 2021-12-06 MED ORDER — ACETAMINOPHEN 325 MG PO TABS: 650.0000 mg | ORAL_TABLET | Freq: Four times a day (QID) | ORAL | 0 refills | Status: AC | PRN

## 2021-12-06 MED ORDER — LIDOCAINE 5 % EX PTCH
1.0000 | MEDICATED_PATCH | CUTANEOUS | Status: DC
  Administered 2021-12-06: 1 via TRANSDERMAL
  Filled 2021-12-06: qty 1

## 2021-12-06 MED ORDER — ACETAMINOPHEN 325 MG PO TABS
650.0000 mg | ORAL_TABLET | Freq: Once | ORAL | Status: AC
  Administered 2021-12-06: 650 mg via ORAL
  Filled 2021-12-06: qty 2

## 2021-12-06 MED ORDER — LIDOCAINE 5 % EX PTCH: 1.0000 | MEDICATED_PATCH | Freq: Every day | CUTANEOUS | 0 refills | Status: DC | PRN

## 2021-12-06 NOTE — Discharge Instructions (Signed)
Have someone stay with you until you feel stable. Do not drive, operate machinery, or play sports until your caregiver says it is okay. Keep all follow-up appointments as directed by your caregiver. Lie down right away if you start feeling like you might faint. Breathe deeply and steadily. Wait until all the symptoms have passed.Drink enough fluids to keep your urine clear or pale yellow. If you are taking blood pressure or heart medicine, get up slowly, taking several minutes to sit and then stand. This can reduce dizziness. SEEK IMMEDIATE MEDICAL CARE IF: You have a severe headache. You have unusual pain in the chest, abdomen, or back. You are bleeding from the mouth or rectum, or you have a black or tarry stool. You have an irregular or very fast heartbeat. You have pain with breathing. You have repeated fainting or seizure-like jerking during an episode. You faint when sitting or lying down. You have confusion. You have difficulty walking. You have severe weakness. You have vision problems. If you fainted, call your local emergency services - do not drive yourself to the hospital.   Please return to the emergency department immediately for any new or concerning symptoms, or if you get worse. 

## 2021-12-08 ENCOUNTER — Other Ambulatory Visit: Payer: Self-pay | Admitting: Obstetrics and Gynecology

## 2021-12-15 ENCOUNTER — Other Ambulatory Visit: Payer: Self-pay

## 2021-12-15 ENCOUNTER — Ambulatory Visit
Admission: RE | Admit: 2021-12-15 | Discharge: 2021-12-15 | Disposition: A | Discharge status: Home / Self Care | Payer: Medicare Other | Source: Ambulatory Visit | Attending: Gastroenterology | Admitting: Gastroenterology

## 2021-12-15 DIAGNOSIS — R109 Unspecified abdominal pain: Secondary | ICD-10-CM

## 2021-12-15 MED ORDER — IOPAMIDOL (ISOVUE-300) INJECTION 61%
100.0000 mL | Freq: Once | INTRAVENOUS | Status: AC | PRN
  Administered 2021-12-15: 100 mL via INTRAVENOUS

## 2022-02-24 ENCOUNTER — Other Ambulatory Visit: Payer: Self-pay | Admitting: Adult Health

## 2022-03-18 ENCOUNTER — Other Ambulatory Visit: Payer: Self-pay | Admitting: Adult Health

## 2022-03-22 DIAGNOSIS — F411 Generalized anxiety disorder: Secondary | ICD-10-CM | POA: Diagnosis present

## 2022-04-12 ENCOUNTER — Emergency Department (HOSPITAL_BASED_OUTPATIENT_CLINIC_OR_DEPARTMENT_OTHER): Payer: Medicare Other

## 2022-04-12 ENCOUNTER — Emergency Department (HOSPITAL_BASED_OUTPATIENT_CLINIC_OR_DEPARTMENT_OTHER)
Admission: EM | Admit: 2022-04-12 | Discharge: 2022-04-12 | Disposition: A | Discharge status: Home / Self Care | Payer: Medicare Other | Attending: Emergency Medicine | Admitting: Emergency Medicine

## 2022-04-12 ENCOUNTER — Encounter (HOSPITAL_BASED_OUTPATIENT_CLINIC_OR_DEPARTMENT_OTHER): Payer: Self-pay | Admitting: Emergency Medicine

## 2022-04-12 DIAGNOSIS — W01198A Fall on same level from slipping, tripping and stumbling with subsequent striking against other object, initial encounter: Secondary | ICD-10-CM | POA: Insufficient documentation

## 2022-04-12 DIAGNOSIS — Z79899 Other long term (current) drug therapy: Secondary | ICD-10-CM | POA: Insufficient documentation

## 2022-04-12 DIAGNOSIS — Z7982 Long term (current) use of aspirin: Secondary | ICD-10-CM | POA: Insufficient documentation

## 2022-04-12 DIAGNOSIS — S0990XA Unspecified injury of head, initial encounter: Secondary | ICD-10-CM | POA: Diagnosis present

## 2022-04-12 DIAGNOSIS — W19XXXA Unspecified fall, initial encounter: Secondary | ICD-10-CM

## 2022-04-12 MED ORDER — ACETAMINOPHEN 500 MG PO TABS: 1000.0000 mg | ORAL_TABLET | Freq: Once | ORAL | Status: DC

## 2022-04-12 NOTE — ED Triage Notes (Signed)
Pt states she fell in her dining room tonight around 2200  Pt states she hit her head on the table when she fell   EMS came out to her house  Pt denies LOC  No laceration noted  Pt does not take blood thinners  Pt states her shoulders hurt

## 2022-04-12 NOTE — ED Provider Notes (Signed)
[facility] EMERGENCY DEPARTMENT Provider Note   CSN: [id number] Arrival date & time: 04/12/22  0054     History  Chief Complaint  Patient presents with   Kiara Blair is a 88 y.o. female.  The history is provided by the patient and a relative.  Fall This is a new problem. The current episode started 3 to 5 hours ago. The problem occurs rarely. The problem has been resolved. Pertinent negatives include no chest pain, no abdominal pain and no shortness of breath. Nothing aggravates the symptoms. Nothing relieves the symptoms. She has tried nothing for the symptoms. The treatment provided no relief.       Home Medications Prior to Admission medications   Medication Sig Start Date End Date Taking? Authorizing Provider  acetaminophen (TYLENOL) 325 MG tablet Take 2 tablets (650 mg total) by mouth every 6 (six) hours as needed. 12/06/21   Sloan Leiter, DO  amLODipine-benazepril (LOTREL) 5-20 MG per capsule Take 1 capsule by mouth daily.    [provider]  aspirin EC 81 MG tablet Take 1 tablet (81 mg total) by mouth daily. Swallow whole. 01/05/21   Zannie Cove, MD  atorvastatin (LIPITOR) 40 MG tablet Take 1 tablet (40 mg total) by mouth daily. 02/08/21   Ihor Austin, NP  KAZANO 12.5-500 MG TABS Take 1 tablet by mouth 2 (two) times daily. 04/21/20   [provider]  lidocaine (LIDODERM) 5 % Place 1 patch onto the skin daily as needed. Remove & Discard patch within 12 hours or as directed by MD 12/06/21   Sloan Leiter, DO  Multiple Vitamin (MULTIVITAMIN WITH MINERALS) TABS tablet Take 1 tablet by mouth daily. Dr. Linton Flemings Vitamins.    [provider]  propranolol (INDERAL) 10 MG tablet Take 0.5 tablets (5 mg total) by mouth 2 (two) times daily. 01/05/21   Zannie Cove, MD      Allergies    Doxycycline hyclate, Levofloxacin, Meloxicam, Naproxen-esomeprazole mg, Nitrofurantoin, Other, and Pneumococcal polysaccharide vaccine    Review  of Systems   Review of Systems  Constitutional:  Negative for fever.  Eyes:  Negative for photophobia.  Respiratory:  Negative for shortness of breath.   Cardiovascular:  Negative for chest pain.  Gastrointestinal:  Negative for abdominal pain.  Musculoskeletal:  Positive for arthralgias.  Skin:  Negative for wound.  All other systems reviewed and are negative.   Physical Exam Updated Vital Signs BP (!) 152/53 (BP Location: Right Arm)   Pulse 70   Temp 98.4 F (36.9 C)   Ht 5' (1.524 m)   Wt 47.6 kg   SpO2 94%   BMI 20.51 kg/m  Physical Exam Vitals and nursing note reviewed.  Constitutional:      General: She is not in acute distress.    Appearance: She is well-developed.  HENT:     Head: Normocephalic and atraumatic.     Nose: Nose normal.  Eyes:     Pupils: Pupils are equal, round, and reactive to light.  Cardiovascular:     Rate and Rhythm: Normal rate and regular rhythm.     Pulses: Normal pulses.     Heart sounds: Normal heart sounds.  Pulmonary:     Effort: Pulmonary effort is normal. No respiratory distress.     Breath sounds: Normal breath sounds.  Abdominal:     General: Bowel sounds are normal. There is no distension.     Palpations: Abdomen is soft.  Tenderness: There is no abdominal tenderness. There is no guarding or rebound.  Genitourinary:    Vagina: No vaginal discharge.  Musculoskeletal:        General: Normal range of motion.     Right shoulder: Normal.     Left shoulder: Normal.     Right upper arm: Normal.     Left upper arm: Normal.     Right wrist: No snuff box tenderness.     Left wrist: No snuff box tenderness.     Cervical back: Normal range of motion and neck supple.  Skin:    General: Skin is warm and dry.     Capillary Refill: Capillary refill takes less than 2 seconds.     Findings: No erythema or rash.  Neurological:     General: No focal deficit present.     Mental Status: She is oriented to person, place, and time.      Deep Tendon Reflexes: Reflexes normal.  Psychiatric:        Mood and Affect: Mood normal.        Behavior: Behavior normal.     ED Results / Procedures / Treatments   Labs (all labs ordered are listed, but only abnormal results are displayed) Labs Reviewed - No data to display  EKG None  Radiology CT Cervical Spine Wo Contrast  Result Date: 04/12/2022 CLINICAL DATA:  Fall striking head on table. EXAM: CT CERVICAL SPINE WITHOUT CONTRAST TECHNIQUE: Multidetector CT imaging of the cervical spine was performed without intravenous contrast. Multiplanar CT image reconstructions were also generated. RADIATION DOSE REDUCTION: This exam was performed according to the departmental dose-optimization program which includes automated exposure control, adjustment of the mA and/or kV according to patient size and/or use of iterative reconstruction technique. COMPARISON:  12/05/2021 FINDINGS: Alignment: No traumatic subluxation. Similar trace anterolisthesis of C7 on T1. Skull base and vertebrae: No acute fracture. Vertebral body heights are maintained. The dens and skull base are intact. Heterogeneous bone marrow may be related to osteopenia/osteoporosis. Soft tissues and spinal canal: No prevertebral fluid or swelling. No visible canal hematoma. Disc levels: Mild diffuse degenerative disc disease and facet hypertrophy. Upper chest: No acute findings. 3.2 cm hypodense right thyroid nodule. This has been evaluated on previous imaging. (ref: J Am Coll Radiol. 2015 Feb;12(2): 143-50).Thyroid ultrasound 02/23/2020 Other: None. IMPRESSION: 1. No acute fracture or traumatic subluxation of the cervical spine. 2. Heterogeneous bone marrow may be related to osteopenia/osteoporosis. Electronically Signed   By: Narda Rutherford M.D.   On: 04/12/2022 01:38   DG Chest Portable 1 View  Result Date: 04/12/2022 CLINICAL DATA:  Recent fall with chest pain, initial encounter EXAM: PORTABLE CHEST 1 VIEW COMPARISON:  12/05/2021  FINDINGS: Cardiac shadow is stable. Aortic calcifications are noted. The lungs are clear bilaterally. No acute bony abnormality is noted. IMPRESSION: No active disease. Electronically Signed   By: Alcide Clever M.D.   On: 04/12/2022 01:33   CT Head Wo Contrast  Result Date: 04/12/2022 CLINICAL DATA:  Fall striking head on table. EXAM: CT HEAD WITHOUT CONTRAST TECHNIQUE: Contiguous axial images were obtained from the base of the skull through the vertex without intravenous contrast. RADIATION DOSE REDUCTION: This exam was performed according to the departmental dose-optimization program which includes automated exposure control, adjustment of the mA and/or kV according to patient size and/or use of iterative reconstruction technique. COMPARISON:  Head CT 12/05/2021 FINDINGS: Brain: No intracranial hemorrhage, mass effect, or midline shift. Normal for age atrophy. No hydrocephalus. The basilar  cisterns are patent. Mild periventricular chronic small vessel ischemia. No evidence of territorial infarct or acute ischemia. No extra-axial or intracranial fluid collection. Vascular: Atherosclerosis of skullbase vasculature without hyperdense vessel or abnormal calcification. Skull: No fracture or focal lesion. Sinuses/Orbits: No acute findings. Mild mucosal thickening throughout the paranasal sinuses. Minimal opacification of lower left mastoid air cells, chronic. Bilateral lens extraction. Other: No large scalp hematoma. IMPRESSION: 1. No acute intracranial abnormality. No skull fracture. 2. Age-related atrophy and chronic small vessel ischemia. Electronically Signed   By: Narda Rutherford M.D.   On: 04/12/2022 01:32    Procedures Procedures    Medications Ordered in ED Medications - No data to display  ED Course/ Medical Decision Making/ A&P                           Medical Decision Making Patient who fell in the dining room and hit head on table   Amount and/or Complexity of Data Reviewed Independent  Historian:     Details: son see above External Data Reviewed: notes.    Details: previous notes reviewed Radiology: ordered and independent interpretation performed.    Details: no head injury, no acute c spine fracture.  No bony injuries of the shoulders on XR  Risk OTC drugs. Risk Details: Tylenol and ibuprofen and ice therapy.  Stable for discharge with close follow up.  Strict return precautions given.      Final Clinical Impression(s) / ED Diagnoses Final diagnoses:  Fall, initial encounter  Return for intractable cough, coughing up blood, fevers > 100.4 unrelieved by medication, shortness of breath, intractable vomiting, chest pain, shortness of breath, weakness, numbness, changes in speech, facial asymmetry, abdominal pain, passing out, Inability to tolerate liquids or food, cough, altered mental status or any concerns. No signs of systemic illness or infection. The patient is nontoxic-appearing on exam and vital signs are within normal limits.  I have reviewed the triage vital signs and the nursing notes. Pertinent labs & imaging results that were available during my care of the patient were reviewed by me and considered in my medical decision making (see chart for details). After history, exam, and medical workup I feel the patient has been appropriately medically screened and is safe for discharge home. Pertinent diagnoses were   Rx / DC Orders ED Discharge Orders     None         Erubiel Manasco, MD 04/12/22 0151

## 2022-05-02 ENCOUNTER — Emergency Department (HOSPITAL_COMMUNITY)
Admission: EM | Admit: 2022-05-02 | Discharge: 2022-05-02 | Disposition: A | Discharge status: Home / Self Care | Payer: Medicare Other | Attending: Emergency Medicine | Admitting: Emergency Medicine

## 2022-05-02 ENCOUNTER — Emergency Department (HOSPITAL_COMMUNITY): Payer: Medicare Other

## 2022-05-02 ENCOUNTER — Other Ambulatory Visit: Payer: Self-pay

## 2022-05-02 DIAGNOSIS — S0181XA Laceration without foreign body of other part of head, initial encounter: Secondary | ICD-10-CM | POA: Diagnosis not present

## 2022-05-02 DIAGNOSIS — W01198A Fall on same level from slipping, tripping and stumbling with subsequent striking against other object, initial encounter: Secondary | ICD-10-CM | POA: Diagnosis not present

## 2022-05-02 DIAGNOSIS — I10 Essential (primary) hypertension: Secondary | ICD-10-CM | POA: Insufficient documentation

## 2022-05-02 DIAGNOSIS — Y92009 Unspecified place in unspecified non-institutional (private) residence as the place of occurrence of the external cause: Secondary | ICD-10-CM | POA: Diagnosis not present

## 2022-05-02 DIAGNOSIS — S0990XA Unspecified injury of head, initial encounter: Secondary | ICD-10-CM | POA: Diagnosis present

## 2022-05-02 DIAGNOSIS — Z79899 Other long term (current) drug therapy: Secondary | ICD-10-CM | POA: Diagnosis not present

## 2022-05-02 DIAGNOSIS — Z7982 Long term (current) use of aspirin: Secondary | ICD-10-CM | POA: Diagnosis not present

## 2022-05-02 DIAGNOSIS — W19XXXA Unspecified fall, initial encounter: Secondary | ICD-10-CM

## 2022-05-02 DIAGNOSIS — Z23 Encounter for immunization: Secondary | ICD-10-CM | POA: Insufficient documentation

## 2022-05-02 DIAGNOSIS — E119 Type 2 diabetes mellitus without complications: Secondary | ICD-10-CM | POA: Diagnosis not present

## 2022-05-02 LAB — I-STAT CHEM 8, ED
BUN: 20 mg/dL (ref 8–23)
Calcium, Ion: 1.24 mmol/L (ref 1.15–1.40)
Chloride: 101 mmol/L (ref 98–111)
Creatinine, Ser: 0.7 mg/dL (ref 0.44–1.00)
Glucose, Bld: 208 mg/dL — ABNORMAL HIGH (ref 70–99)
HCT: 38 % (ref 36.0–46.0)
Hemoglobin: 12.9 g/dL (ref 12.0–15.0)
Potassium: 4 mmol/L (ref 3.5–5.1)
Sodium: 137 mmol/L (ref 135–145)
TCO2: 25 mmol/L (ref 22–32)

## 2022-05-02 MED ORDER — TETANUS-DIPHTH-ACELL PERTUSSIS 5-2.5-18.5 LF-MCG/0.5 IM SUSY
0.5000 mL | PREFILLED_SYRINGE | Freq: Once | INTRAMUSCULAR | Status: AC
  Administered 2022-05-02: 0.5 mL via INTRAMUSCULAR
  Filled 2022-05-02: qty 0.5

## 2022-05-02 MED ORDER — ACETAMINOPHEN 325 MG PO TABS
650.0000 mg | ORAL_TABLET | Freq: Once | ORAL | Status: AC
  Administered 2022-05-02: 650 mg via ORAL
  Filled 2022-05-02: qty 2

## 2022-05-02 NOTE — ED Triage Notes (Signed)
"  Walking down the hallway without her walker and tripped on the rug." Hematoma to left eye with abrasion. Skin tear to left shoulder. No loc. Denies headache, n/v.

## 2022-05-02 NOTE — Discharge Instructions (Addendum)
You have been evaluated for your fall.  Fortunately CT scan did not show any significant signs of injury.  Please apply cool compress to the facial wound as needed for comfort.  You may apply Neosporin over the cut to decrease risk of infection.  Do this once to twice daily.  Take over-the-counter Tylenol as needed for headache.  Please use your walker when ambulating to decrease risk of falling.

## 2022-05-02 NOTE — ED Provider Notes (Signed)
[facility] EMERGENCY DEPARTMENT Provider Note   CSN: [id number] Arrival date & time: 05/02/22  1017     History  Chief Complaint  Patient presents with   Kiara Blair is a 88 y.o. female.  The history is provided by the patient and medical records. No language interpreter was used.  Fall    88 year old female significant history of diabetes, hypertension, prior stroke brought here via EMS from home for evaluation of a fall.  Patient reports she lives at home by herself.  This morning she was about to take a bath and then she realized she has to open the door for her son to come in later.  She reports she rushed to open the door and forgot to use her walker.  On her way back to the bathroom she tripped and fell striking her forehead against the ground.  She denies any loss of consciousness.  She does endorse mild pain to the left side of her face and noticed bleeding.  She does not endorse any significant headache, neck pain, pain to extremities, chest pain or shortness of breath.  No precipitating symptoms prior to the fall.  She is not on any blood thinner medication.  No specific treatment tried.  Home Medications Prior to Admission medications   Medication Sig Start Date End Date Taking? Authorizing Provider  acetaminophen (TYLENOL) 325 MG tablet Take 2 tablets (650 mg total) by mouth every 6 (six) hours as needed. 12/06/21   Jeanell Sparrow, DO  amLODipine-benazepril (LOTREL) 5-20 MG per capsule Take 1 capsule by mouth daily.    [provider]  aspirin EC 81 MG tablet Take 1 tablet (81 mg total) by mouth daily. Swallow whole. 01/05/21   Domenic Polite, MD  atorvastatin (LIPITOR) 40 MG tablet Take 1 tablet (40 mg total) by mouth daily. 02/08/21   Frann Rider, NP  KAZANO 12.5-500 MG TABS Take 1 tablet by mouth 2 (two) times daily. 04/21/20   [provider]  lidocaine (LIDODERM) 5 % Place 1 patch onto the skin daily as needed. Remove &  Discard patch within 12 hours or as directed by MD 12/06/21   Jeanell Sparrow, DO  Multiple Vitamin (MULTIVITAMIN WITH MINERALS) TABS tablet Take 1 tablet by mouth daily. Dr. Lenon Oms Vitamins.    [provider]  propranolol (INDERAL) 10 MG tablet Take 0.5 tablets (5 mg total) by mouth 2 (two) times daily. 01/05/21   Domenic Polite, MD      Allergies    Doxycycline hyclate, Levofloxacin, Meloxicam, Naproxen-esomeprazole mg, Nitrofurantoin, Other, and Pneumococcal polysaccharide vaccine    Review of Systems   Review of Systems  All other systems reviewed and are negative.   Physical Exam Updated Vital Signs BP (!) 162/60   Pulse 64   Temp 97.8 F (36.6 C) (Oral)   Resp 11   SpO2 99%  Physical Exam Vitals and nursing note reviewed.  Constitutional:      General: She is not in acute distress.    Appearance: She is well-developed.  HENT:     Head: Normocephalic.     Comments: 1 cm shallow laceration noted to left orbital rim with some dried blood noted.  Mildly tender to palpation without any crepitus.  No hemotympanum, no septal hematoma, no malocclusion, no midface tenderness, no battle signs or raccoon's eyes. Eyes:     Extraocular Movements: Extraocular movements intact.     Conjunctiva/sclera: Conjunctivae normal.     Pupils:  Pupils are equal, round, and reactive to light.  Pulmonary:     Effort: Pulmonary effort is normal.  Abdominal:     Palpations: Abdomen is soft.     Tenderness: There is no abdominal tenderness.  Musculoskeletal:     Cervical back: Normal range of motion and neck supple.     Comments: Able to move all 4 extremities without difficulty.  No signs of injury to extremities.  Skin:    Findings: No rash.  Neurological:     Mental Status: She is alert. Mental status is at baseline.  Psychiatric:        Mood and Affect: Mood normal.     ED Results / Procedures / Treatments   Labs (all labs ordered are listed, but only abnormal results are  displayed) Labs Reviewed  I-STAT CHEM 8, ED - Abnormal; Notable for the following components:      Result Value   Glucose, Bld 208 (*)    All other components within normal limits  URINALYSIS, ROUTINE W REFLEX MICROSCOPIC    EKG None  Date: 05/02/2022  Rate: 64  Rhythm: normal sinus rhythm  QRS Axis: normal  Intervals: normal  ST/T Wave abnormalities: normal  Conduction Disutrbances: none  Narrative Interpretation:   Old EKG Reviewed: No significant changes noted     Radiology CT Head Wo Contrast  Result Date: 05/02/2022 CLINICAL DATA:  Head trauma, moderate-severe; Polytrauma, blunt EXAM: CT HEAD WITHOUT CONTRAST CT MAXILLOFACIAL WITHOUT CONTRAST CT CERVICAL SPINE WITHOUT CONTRAST TECHNIQUE: Multidetector CT imaging of the head, cervical spine, and maxillofacial structures were performed using the standard protocol without intravenous contrast. Multiplanar CT image reconstructions of the cervical spine and maxillofacial structures were also generated. RADIATION DOSE REDUCTION: This exam was performed according to the departmental dose-optimization program which includes automated exposure control, adjustment of the mA and/or kV according to patient size and/or use of iterative reconstruction technique. COMPARISON:  CT head and CT cervical spine 04/12/2022. FINDINGS: CT HEAD FINDINGS Brain: No evidence of acute infarction, hemorrhage, hydrocephalus, extra-axial collection or mass lesion/mass effect. Patchy white matter hypodensities, nonspecific but compatible with chronic microvascular ischemic disease. Vascular: No hyperdense vessel identified. Calcific intracranial atherosclerosis. Skull: No acute fracture. Other: No mastoid effusions. CT MAXILLOFACIAL FINDINGS Osseous: No fracture or mandibular dislocation. No destructive process. Orbits: Negative. No traumatic or inflammatory finding. Sinuses: Left frontal and anterior left ethmoid air cell mucosal thickening. No air-fluid levels.  Soft tissues: Negative. CT CERVICAL SPINE FINDINGS Alignment: Similar alignment.  No substantial sagittal subluxation. Skull base and vertebrae: Vertebral body heights are unchanged. No evidence of acute fracture. Soft tissues and spinal canal: No prevertebral fluid or swelling. No visible canal hematoma. Disc levels: Multilevel degenerative disc disease and multilevel facet/uncovertebral hypertrophy with varying degrees of neural foraminal stenosis. Upper chest: Visualized lung apices are clear. Other: Large (4.2 cm) heterogeneous right thyroid nodule. This has been evaluated on prior imaging (thyroid ultrasound from 02/23/2020). (ref: J Am Coll Radiol. 2015 Feb;12(2): 143-50). IMPRESSION: 1. No evidence of acute intracranial abnormality or facial fracture. 2. No evidence of acute fracture or traumatic malalignment in the cervical spine. Electronically Signed   By: Feliberto Harts M.D.   On: 05/02/2022 12:10   CT Maxillofacial Wo Contrast  Result Date: 05/02/2022 CLINICAL DATA:  Head trauma, moderate-severe; Polytrauma, blunt EXAM: CT HEAD WITHOUT CONTRAST CT MAXILLOFACIAL WITHOUT CONTRAST CT CERVICAL SPINE WITHOUT CONTRAST TECHNIQUE: Multidetector CT imaging of the head, cervical spine, and maxillofacial structures were performed using the standard protocol without intravenous  contrast. Multiplanar CT image reconstructions of the cervical spine and maxillofacial structures were also generated. RADIATION DOSE REDUCTION: This exam was performed according to the departmental dose-optimization program which includes automated exposure control, adjustment of the mA and/or kV according to patient size and/or use of iterative reconstruction technique. COMPARISON:  CT head and CT cervical spine 04/12/2022. FINDINGS: CT HEAD FINDINGS Brain: No evidence of acute infarction, hemorrhage, hydrocephalus, extra-axial collection or mass lesion/mass effect. Patchy white matter hypodensities, nonspecific but compatible with  chronic microvascular ischemic disease. Vascular: No hyperdense vessel identified. Calcific intracranial atherosclerosis. Skull: No acute fracture. Other: No mastoid effusions. CT MAXILLOFACIAL FINDINGS Osseous: No fracture or mandibular dislocation. No destructive process. Orbits: Negative. No traumatic or inflammatory finding. Sinuses: Left frontal and anterior left ethmoid air cell mucosal thickening. No air-fluid levels. Soft tissues: Negative. CT CERVICAL SPINE FINDINGS Alignment: Similar alignment.  No substantial sagittal subluxation. Skull base and vertebrae: Vertebral body heights are unchanged. No evidence of acute fracture. Soft tissues and spinal canal: No prevertebral fluid or swelling. No visible canal hematoma. Disc levels: Multilevel degenerative disc disease and multilevel facet/uncovertebral hypertrophy with varying degrees of neural foraminal stenosis. Upper chest: Visualized lung apices are clear. Other: Large (4.2 cm) heterogeneous right thyroid nodule. This has been evaluated on prior imaging (thyroid ultrasound from 02/23/2020). (ref: J Am Coll Radiol. 2015 Feb;12(2): 143-50). IMPRESSION: 1. No evidence of acute intracranial abnormality or facial fracture. 2. No evidence of acute fracture or traumatic malalignment in the cervical spine. Electronically Signed   By: Feliberto HartsFrederick S Jones M.D.   On: 05/02/2022 12:10   CT Cervical Spine Wo Contrast  Result Date: 05/02/2022 CLINICAL DATA:  Head trauma, moderate-severe; Polytrauma, blunt EXAM: CT HEAD WITHOUT CONTRAST CT MAXILLOFACIAL WITHOUT CONTRAST CT CERVICAL SPINE WITHOUT CONTRAST TECHNIQUE: Multidetector CT imaging of the head, cervical spine, and maxillofacial structures were performed using the standard protocol without intravenous contrast. Multiplanar CT image reconstructions of the cervical spine and maxillofacial structures were also generated. RADIATION DOSE REDUCTION: This exam was performed according to the departmental  dose-optimization program which includes automated exposure control, adjustment of the mA and/or kV according to patient size and/or use of iterative reconstruction technique. COMPARISON:  CT head and CT cervical spine 04/12/2022. FINDINGS: CT HEAD FINDINGS Brain: No evidence of acute infarction, hemorrhage, hydrocephalus, extra-axial collection or mass lesion/mass effect. Patchy white matter hypodensities, nonspecific but compatible with chronic microvascular ischemic disease. Vascular: No hyperdense vessel identified. Calcific intracranial atherosclerosis. Skull: No acute fracture. Other: No mastoid effusions. CT MAXILLOFACIAL FINDINGS Osseous: No fracture or mandibular dislocation. No destructive process. Orbits: Negative. No traumatic or inflammatory finding. Sinuses: Left frontal and anterior left ethmoid air cell mucosal thickening. No air-fluid levels. Soft tissues: Negative. CT CERVICAL SPINE FINDINGS Alignment: Similar alignment.  No substantial sagittal subluxation. Skull base and vertebrae: Vertebral body heights are unchanged. No evidence of acute fracture. Soft tissues and spinal canal: No prevertebral fluid or swelling. No visible canal hematoma. Disc levels: Multilevel degenerative disc disease and multilevel facet/uncovertebral hypertrophy with varying degrees of neural foraminal stenosis. Upper chest: Visualized lung apices are clear. Other: Large (4.2 cm) heterogeneous right thyroid nodule. This has been evaluated on prior imaging (thyroid ultrasound from 02/23/2020). (ref: J Am Coll Radiol. 2015 Feb;12(2): 143-50). IMPRESSION: 1. No evidence of acute intracranial abnormality or facial fracture. 2. No evidence of acute fracture or traumatic malalignment in the cervical spine. Electronically Signed   By: Feliberto HartsFrederick S Jones M.D.   On: 05/02/2022 12:10    Procedures Procedures  Medications Ordered in ED Medications  acetaminophen (TYLENOL) tablet 650 mg (650 mg Oral Given 05/02/22 1102)  Tdap  (BOOSTRIX) injection 0.5 mL (0.5 mLs Intramuscular Given 05/02/22 1105)    ED Course/ Medical Decision Making/ A&P                           Medical Decision Making Amount and/or Complexity of Data Reviewed Labs: ordered. Radiology: ordered.  Risk OTC drugs. Prescription drug management.   BP (!) 162/60   Pulse 64   Temp 97.8 F (36.6 C) (Oral)   Resp 11   SpO2 99%   10:55 AM This is a 14-year-old female presenting for evaluation of fall.  Patient normally use a walker to walk.  She lives at home by herself.  This morning she was in her bathroom prepared to shower when she realizes she has to go open her door for her son to come in later.  She reports she went to open the door without using her walker and on the way back she tripped and fell striking her head against the ground.  No loss of consciousness.  She does suffer laceration to her left orbital region thus prompting this ER visit.  She endorsed mild tenderness to the affected area but otherwise no headache no confusion no neck pain and no injury anywhere else.  She is mentating appropriately and appears to be in no acute discomfort.  On exam she does have a shallow 1 cm laceration to the lateral aspect of left orbital region without any crepitus.  Given her age, will obtain head, maxillofacial and cervical CT scan for further assessment.  Will check basic labs.  Tylenol given for pain, will update tetanus.  Patient overall mentating appropriately and she does not have any precipitating symptoms prior to the fall.  Suspect fall due to not having her walker when ambulating.  She is not on blood thinner medication.   1:36 PM Labs and imaging independently viewed interpreted by me and I agree with radiology interpretation.  No significant electrolyte imbalance with exception of elevated CBG of 208.  CT scan of the head, facial, and cervical spine obtained and without any acute intracranial abnormalities, facial fracture, or acute  fracture of the spine.  EKG reviewed and interpreted by me is without concerning arrhythmia or ischemic changes.  Facial wound was cleaned and dressed appropriately.  Patient legal guardian is available at bedside and felt comfortable taking patient home.  This patient presents to the ED for concern of fall, this involves an extensive number of treatment options, and is a complaint that carries with it a high risk of complications and morbidity.  The differential diagnosis includes mechanical fall, dehydration, stroke, hypoglycemia, electrolytes imbalance, vertigo, acs  Co morbidities that complicate the patient evaluation DM  Tremors  Additional history obtained:  Additional history obtained from EMS External records from outside source obtained and reviewed including EMR along with prior labs and imaging  Lab Tests:  I Ordered, and personally interpreted labs.  The pertinent results include:  as above  Imaging Studies ordered:  I ordered imaging studies including CT head/maxillofacial/c-spine I independently visualized and interpreted imaging which showed no acute changes I agree with the radiologist interpretation  Cardiac Monitoring:  The patient was maintained on a cardiac monitor.  I personally viewed and interpreted the cardiac monitored which showed an underlying rhythm of: NSR  Medicines ordered and prescription drug management:  I ordered medication  including tylenol  for headache Reevaluation of the patient after these medicines showed that the patient improved I have reviewed the patients home medicines and have made adjustments as needed  Test Considered: as above  Critical Interventions: as above  Consultations Obtained:  I requested consultation with the attending Dr. Rubin Payor,  and discussed lab and imaging findings as well as pertinent plan - they recommend: outpt f/u  Problem List / ED Course: fall  Minor head injury  Reevaluation:  After the  interventions noted above, I reevaluated the patient and found that they have :improved  Social Determinants of Health: none  Dispostion:  After consideration of the diagnostic results and the patients response to treatment, I feel that the patent would benefit from outpt f/u.         Final Clinical Impression(s) / ED Diagnoses Final diagnoses:  Facial laceration, initial encounter  Fall in home, initial encounter    Rx / DC Orders ED Discharge Orders     None         Fayrene Helper, PA-C 05/02/22 1341    Benjiman Core, MD 05/03/22 [phone]

## 2022-07-31 ENCOUNTER — Encounter (HOSPITAL_BASED_OUTPATIENT_CLINIC_OR_DEPARTMENT_OTHER): Payer: Self-pay | Admitting: *Deleted

## 2022-07-31 ENCOUNTER — Emergency Department (HOSPITAL_BASED_OUTPATIENT_CLINIC_OR_DEPARTMENT_OTHER)
Admission: EM | Admit: 2022-07-31 | Discharge: 2022-07-31 | Disposition: A | Discharge status: Home / Self Care | Payer: Medicare Other | Attending: Emergency Medicine | Admitting: Emergency Medicine

## 2022-07-31 ENCOUNTER — Other Ambulatory Visit: Payer: Self-pay

## 2022-07-31 ENCOUNTER — Emergency Department (HOSPITAL_BASED_OUTPATIENT_CLINIC_OR_DEPARTMENT_OTHER): Payer: Medicare Other

## 2022-07-31 DIAGNOSIS — M25551 Pain in right hip: Secondary | ICD-10-CM | POA: Diagnosis not present

## 2022-07-31 DIAGNOSIS — E119 Type 2 diabetes mellitus without complications: Secondary | ICD-10-CM | POA: Insufficient documentation

## 2022-07-31 DIAGNOSIS — I10 Essential (primary) hypertension: Secondary | ICD-10-CM | POA: Diagnosis not present

## 2022-07-31 DIAGNOSIS — M5431 Sciatica, right side: Secondary | ICD-10-CM | POA: Insufficient documentation

## 2022-07-31 DIAGNOSIS — M545 Low back pain, unspecified: Secondary | ICD-10-CM | POA: Diagnosis present

## 2022-07-31 LAB — URINALYSIS, ROUTINE W REFLEX MICROSCOPIC
Bilirubin Urine: NEGATIVE
Glucose, UA: NEGATIVE mg/dL
Hgb urine dipstick: NEGATIVE
Ketones, ur: NEGATIVE mg/dL
Leukocytes,Ua: NEGATIVE
Nitrite: NEGATIVE
Protein, ur: NEGATIVE mg/dL
Specific Gravity, Urine: 1.01 (ref 1.005–1.030)
pH: 6 (ref 5.0–8.0)

## 2022-07-31 MED ORDER — ACETAMINOPHEN 500 MG PO TABS
1000.0000 mg | ORAL_TABLET | Freq: Once | ORAL | Status: AC
  Administered 2022-07-31: 1000 mg via ORAL
  Filled 2022-07-31: qty 2

## 2022-07-31 MED ORDER — LIDOCAINE 5 % EX OINT: 1.0000 | TOPICAL_OINTMENT | Freq: Four times a day (QID) | CUTANEOUS | 0 refills | Status: DC | PRN

## 2022-07-31 MED ORDER — LIDOCAINE 5 % EX PTCH
1.0000 | MEDICATED_PATCH | Freq: Once | CUTANEOUS | Status: DC
  Administered 2022-07-31: 1 via TRANSDERMAL
  Filled 2022-07-31: qty 1

## 2022-07-31 NOTE — ED Triage Notes (Signed)
Pt has right lower back pain with radiation into hip.  Pain is achy and sharp.  No urinary symptoms with this. No neuro deficits. Not associated with any trauma

## 2022-07-31 NOTE — ED Provider Notes (Signed)
Emergency Department Provider Note   I have reviewed the triage vital signs and the nursing notes.   HISTORY  Chief Complaint Back Pain   HPI Kiara Blair is a 88 y.o. female past history of diabetes, high cholesterol, hypertension presents emergency department with pain she feels most in the right lower back just above the buttock and running down the right leg.  No numbness or weakness.  No fall or injury.  Patient notes that she had pain like this before and believes it may have been called sciatica but is not entirely sure.  No dysuria, hesitancy, urgency.  He is not having pain in the abdomen.  She been trying alternating heat/cool compresses and ibuprofen.    Past Medical History:  Diagnosis Date   Diabetes mellitus without complication (HCC)    Hypercholesteremia    Hypertension    Tremor    right arm    Review of Systems  Constitutional: No fever/chills Cardiovascular: Denies chest pain. Respiratory: Denies shortness of breath. Gastrointestinal: No abdominal pain.   Genitourinary: Negative for dysuria. Musculoskeletal: Positive for back pain and right leg pain.  Skin: Negative for rash. Neurological: Negative for headaches, focal weakness or numbness.   ____________________________________________   PHYSICAL EXAM:  VITAL SIGNS: ED Triage Vitals [07/31/22 1644]  Enc Vitals Group     BP (!) 187/78     Pulse Rate 65     Resp 16     Temp 98 F (36.7 C)     Temp Source Oral     SpO2 100 %   Constitutional: Alert. Well appearing and in no acute distress. Eyes: Conjunctivae are normal.  Head: Atraumatic. Nose: No congestion/rhinnorhea. Mouth/Throat: Mucous membranes are moist.  Neck: No stridor.   Cardiovascular: Normal rate, regular rhythm. Good peripheral circulation. Grossly normal heart sounds. 2+ DP pulses on the right.  Respiratory: Normal respiratory effort.   Gastrointestinal: Soft and nontender. No distention.  Musculoskeletal: Normal range  of motion of the right hip, knee, ankle.  No joint swelling/erythema.  Neurologic:  Normal speech and language. No gross focal neurologic deficits are appreciated.  Skin:  Skin is warm, dry and intact. No rash noted.   ____________________________________________   LABS (all labs ordered are listed, but only abnormal results are displayed)  Labs Reviewed  URINALYSIS, ROUTINE W REFLEX MICROSCOPIC   ____________________________________________  RADIOLOGY  CT Renal Stone Study  Result Date: 07/31/2022 CLINICAL DATA:  Right-sided lower back pain radiating to the right hip. EXAM: CT ABDOMEN AND PELVIS WITHOUT CONTRAST TECHNIQUE: Multidetector CT imaging of the abdomen and pelvis was performed following the standard protocol without IV contrast. RADIATION DOSE REDUCTION: This exam was performed according to the departmental dose-optimization program which includes automated exposure control, adjustment of the mA and/or kV according to patient size and/or use of iterative reconstruction technique. COMPARISON:  December 15, 2021 FINDINGS: Lower chest: No acute abnormality. Hepatobiliary: No focal liver abnormality is seen. Status post cholecystectomy. No biliary dilatation. Pancreas: Unremarkable. No pancreatic ductal dilatation or surrounding inflammatory changes. Spleen: Normal in size without focal abnormality. Adrenals/Urinary Tract: Adrenal glands are unremarkable. Kidneys are normal, without renal calculi, focal lesion, or hydronephrosis. The urinary bladder is partially contracted and subsequently limited in evaluation. Stomach/Bowel: There is a small to moderate sized hiatal hernia. Appendix appears normal. Stool is seen throughout the colon. No evidence of bowel wall thickening, distention, or inflammatory changes. Noninflamed diverticula are seen within the proximal ascending colon and throughout the sigmoid colon. Vascular/Lymphatic: Aortic atherosclerosis. No  enlarged abdominal or pelvic lymph  nodes. Reproductive: Status post hysterectomy. No adnexal masses. Other: No abdominal wall hernia or abnormality. No abdominopelvic ascites. Musculoskeletal: Marked severity multilevel degenerative changes are seen throughout the lumbar spine. IMPRESSION: 1. Small to moderate sized hiatal hernia. 2. Colonic diverticulosis. 3. Marked severity multilevel degenerative changes throughout the lumbar spine. 4. No evidence of renal calculi. 5. Aortic atherosclerosis. Aortic Atherosclerosis (ICD10-I70.0). Electronically Signed   By: Virgina Norfolk M.D.   On: 07/31/2022 19:49    ____________________________________________   PROCEDURES  Procedure(s) performed:   Procedures  None ____________________________________________   INITIAL IMPRESSION / ASSESSMENT AND PLAN / ED COURSE  Pertinent labs & imaging results that were available during my care of the patient were reviewed by me and considered in my medical decision making (see chart for details).   This patient is Presenting for Evaluation of back pain, which does require a range of treatment options, and is a complaint that involves a high risk of morbidity and mortality.  The Differential Diagnoses includes but is not exclusive to musculoskeletal back pain, renal colic, urinary tract infection, pyelonephritis, intra-abdominal causes of back pain, aortic aneurysm or dissection, cauda equina syndrome, sciatica, lumbar disc disease, thoracic disc disease, etc.   Critical Interventions-    Medications  acetaminophen (TYLENOL) tablet 1,000 mg (1,000 mg Oral Given 07/31/22 1914)    Reassessment after intervention: Symptoms improved.    I did obtain Additional Historical Information from son at bedside.    Clinical Laboratory Tests Ordered, included UA without evidence of UTI.   Radiologic Tests Ordered, included CT renal. I independently interpreted the images and agree with radiology interpretation.   Cardiac Monitor Tracing which  shows NSR.    Social Determinants of Health Risk no smoking history.   Medical Decision Making: Summary:  Patient presents emergency department with right leg pain rating from the top of the buttock down the right leg.  Seems clinically most consistent with sciatica.  Given age plan for CT imaging of the abdomen and pelvis to rule out ureteral stone or other bony process in the pelvis although lower suspicion for this without fall or other trauma.  No red flag signs or symptoms to prompt emergent MRI.   Reevaluation with update and discussion with patient and son at bedside. Imaging is reassuring. Patient's symptoms improved with tylenol and topical lidocaine. Will defer stronger pain medication and/or steroids with concern for side effects and increased fall risk, constipation, and confusion. Patient will call PCP in the AM for close follow up.   Considered admission but no clear indication for emergent MRI or other spine imaging. Pain well controlled.   Disposition: discharge  ____________________________________________  FINAL CLINICAL IMPRESSION(S) / ED DIAGNOSES  Final diagnoses:  Sciatica of right side     NEW OUTPATIENT MEDICATIONS STARTED DURING THIS VISIT:  Discharge Medication List as of 07/31/2022  8:42 PM     START taking these medications   Details  lidocaine (XYLOCAINE) 5 % ointment Apply 1 Application topically 4 (four) times daily as needed for moderate pain., Starting Mon 07/31/2022, Normal        Note:  This document was prepared using Dragon voice recognition software and may include unintentional dictation errors.  Nanda Quinton, MD, [facility]    Ronold Hardgrove, [facility], MD 08/01/22 [phone]

## 2022-07-31 NOTE — Discharge Instructions (Addendum)
You were seen in the emergency room today with back and right leg pain.  I suspect you have sciatica and I am using mainly topical medicines and will have you take Tylenol as needed for pain.  Please follow closely with your primary care physician.

## 2022-10-04 ENCOUNTER — Encounter (HOSPITAL_BASED_OUTPATIENT_CLINIC_OR_DEPARTMENT_OTHER): Payer: Medicare Other | Admitting: General Surgery

## 2022-11-07 ENCOUNTER — Emergency Department (HOSPITAL_BASED_OUTPATIENT_CLINIC_OR_DEPARTMENT_OTHER): Payer: Medicare Other

## 2022-11-07 ENCOUNTER — Emergency Department (HOSPITAL_BASED_OUTPATIENT_CLINIC_OR_DEPARTMENT_OTHER)
Admission: EM | Admit: 2022-11-07 | Discharge: 2022-11-07 | Disposition: A | Discharge status: Home / Self Care | Payer: Medicare Other | Attending: Emergency Medicine | Admitting: Emergency Medicine

## 2022-11-07 ENCOUNTER — Other Ambulatory Visit: Payer: Self-pay

## 2022-11-07 ENCOUNTER — Encounter (HOSPITAL_BASED_OUTPATIENT_CLINIC_OR_DEPARTMENT_OTHER): Payer: Self-pay

## 2022-11-07 DIAGNOSIS — E119 Type 2 diabetes mellitus without complications: Secondary | ICD-10-CM | POA: Insufficient documentation

## 2022-11-07 DIAGNOSIS — W0110XA Fall on same level from slipping, tripping and stumbling with subsequent striking against unspecified object, initial encounter: Secondary | ICD-10-CM | POA: Diagnosis not present

## 2022-11-07 DIAGNOSIS — Z7982 Long term (current) use of aspirin: Secondary | ICD-10-CM | POA: Insufficient documentation

## 2022-11-07 DIAGNOSIS — R519 Headache, unspecified: Secondary | ICD-10-CM | POA: Insufficient documentation

## 2022-11-07 DIAGNOSIS — Z23 Encounter for immunization: Secondary | ICD-10-CM | POA: Insufficient documentation

## 2022-11-07 DIAGNOSIS — S51011A Laceration without foreign body of right elbow, initial encounter: Secondary | ICD-10-CM | POA: Insufficient documentation

## 2022-11-07 DIAGNOSIS — S0992XA Unspecified injury of nose, initial encounter: Secondary | ICD-10-CM

## 2022-11-07 DIAGNOSIS — Z79899 Other long term (current) drug therapy: Secondary | ICD-10-CM | POA: Diagnosis not present

## 2022-11-07 DIAGNOSIS — S0031XA Abrasion of nose, initial encounter: Secondary | ICD-10-CM | POA: Insufficient documentation

## 2022-11-07 DIAGNOSIS — M25522 Pain in left elbow: Secondary | ICD-10-CM | POA: Insufficient documentation

## 2022-11-07 DIAGNOSIS — I10 Essential (primary) hypertension: Secondary | ICD-10-CM | POA: Insufficient documentation

## 2022-11-07 DIAGNOSIS — Z8673 Personal history of transient ischemic attack (TIA), and cerebral infarction without residual deficits: Secondary | ICD-10-CM | POA: Diagnosis not present

## 2022-11-07 DIAGNOSIS — S41111A Laceration without foreign body of right upper arm, initial encounter: Secondary | ICD-10-CM | POA: Diagnosis present

## 2022-11-07 DIAGNOSIS — M25512 Pain in left shoulder: Secondary | ICD-10-CM | POA: Insufficient documentation

## 2022-11-07 DIAGNOSIS — W19XXXA Unspecified fall, initial encounter: Secondary | ICD-10-CM

## 2022-11-07 LAB — CBC
HCT: 41.1 % (ref 36.0–46.0)
Hemoglobin: 13.8 g/dL (ref 12.0–15.0)
MCH: 29.1 pg (ref 26.0–34.0)
MCHC: 33.6 g/dL (ref 30.0–36.0)
MCV: 86.5 fL (ref 80.0–100.0)
Platelets: 254 10*3/uL (ref 150–400)
RBC: 4.75 MIL/uL (ref 3.87–5.11)
RDW: 14.4 % (ref 11.5–15.5)
WBC: 9.4 10*3/uL (ref 4.0–10.5)
nRBC: 0 % (ref 0.0–0.2)

## 2022-11-07 LAB — BASIC METABOLIC PANEL
Anion gap: 11 (ref 5–15)
BUN: 25 mg/dL — ABNORMAL HIGH (ref 8–23)
CO2: 25 mmol/L (ref 22–32)
Calcium: 9.7 mg/dL (ref 8.9–10.3)
Chloride: 98 mmol/L (ref 98–111)
Creatinine, Ser: 0.69 mg/dL (ref 0.44–1.00)
GFR, Estimated: >60 mL/min (ref >60)
Glucose, Bld: 247 mg/dL — ABNORMAL HIGH (ref 70–99)
Potassium: 3.7 mmol/L (ref 3.5–5.1)
Sodium: 134 mmol/L — ABNORMAL LOW (ref 135–145)

## 2022-11-07 LAB — TROPONIN I (HIGH SENSITIVITY): Troponin I (High Sensitivity): 16 ng/L (ref <18)

## 2022-11-07 LAB — CBG MONITORING, ED: Glucose-Capillary: 228 mg/dL — ABNORMAL HIGH (ref 70–99)

## 2022-11-07 MED ORDER — ACETAMINOPHEN 325 MG PO TABS
650.0000 mg | ORAL_TABLET | Freq: Once | ORAL | Status: AC
  Administered 2022-11-07: 650 mg via ORAL
  Filled 2022-11-07: qty 2

## 2022-11-07 MED ORDER — TETANUS-DIPHTH-ACELL PERTUSSIS 5-2.5-18.5 LF-MCG/0.5 IM SUSY
0.5000 mL | PREFILLED_SYRINGE | Freq: Once | INTRAMUSCULAR | Status: AC
  Administered 2022-11-07: 0.5 mL via INTRAMUSCULAR
  Filled 2022-11-07: qty 0.5

## 2022-11-07 NOTE — ED Provider Notes (Signed)
[place] [facility] Provider Note   CSN: [id number] Arrival date & time: 11/07/22  1401     History  Chief Complaint  Patient presents with   Kiara Blair is a 89 y.o. female with past medical history significant for hypertension, hyperlipidemia, diabetes, previous TIA, previous CVA who presents with concern for fall this morning.  She fell at around 530, laid on floor until son arrived at approximately 6.  Patient reports that she stepped out of the shower, became dizzy and passed out.  Patient reports that dizziness is not new or atypical for her, she has had a history of similar in the past.  She hit the corner of the shower with the bridge of her nose, bruising and swelling noted on nose, additionally endorses some headache.  She has a small skin tear of the right upper arm.  She denies any hip pain, difficulty walking.  She does not take any blood thinners.   Fall       Home Medications Prior to Admission medications   Medication Sig Start Date End Date Taking? Authorizing Provider  acetaminophen (TYLENOL) 325 MG tablet Take 2 tablets (650 mg total) by mouth every 6 (six) hours as needed. 12/06/21   Sloan Leiter, DO  amLODipine-benazepril (LOTREL) 5-20 MG per capsule Take 1 capsule by mouth daily.    [provider]  aspirin EC 81 MG tablet Take 1 tablet (81 mg total) by mouth daily. Swallow whole. 01/05/21   Zannie Cove, MD  atorvastatin (LIPITOR) 40 MG tablet Take 1 tablet (40 mg total) by mouth daily. 02/08/21   Ihor Austin, NP  KAZANO 12.5-500 MG TABS Take 1 tablet by mouth 2 (two) times daily. 04/21/20   [provider]  lidocaine (XYLOCAINE) 5 % ointment Apply 1 Application topically 4 (four) times daily as needed for moderate pain. 07/31/22   Long, Arlyss Repress, MD  Multiple Vitamin (MULTIVITAMIN WITH MINERALS) TABS tablet Take 1 tablet by mouth daily. Dr. Linton Flemings Vitamins.    [provider]   propranolol (INDERAL) 10 MG tablet Take 0.5 tablets (5 mg total) by mouth 2 (two) times daily. 01/05/21   Zannie Cove, MD      Allergies    Doxycycline hyclate, Levofloxacin, Meloxicam, Naproxen-esomeprazole mg, Nitrofurantoin, Other, and Pneumococcal polysaccharide vaccine    Review of Systems   Review of Systems  Neurological:  Positive for dizziness.  All other systems reviewed and are negative.   Physical Exam Updated Vital Signs BP (!) 174/67   Pulse 67   Temp 98.7 F (37.1 C) (Oral)   Resp 18   Ht 5\' 1"  (1.549 m)   Wt 46.3 kg   SpO2 97%   BMI 19.27 kg/m  Physical Exam Vitals and nursing note reviewed.  Constitutional:      General: She is not in acute distress.    Appearance: Normal appearance.  HENT:     Head: Normocephalic and atraumatic.  Eyes:     General:        Right eye: No discharge.        Left eye: No discharge.  Cardiovascular:     Rate and Rhythm: Normal rate and regular rhythm.     Heart sounds: No murmur heard.    No friction rub. No gallop.  Pulmonary:     Effort: Pulmonary effort is normal.     Breath sounds: Normal breath sounds.  Abdominal:     General: Bowel  sounds are normal.     Palpations: Abdomen is soft.  Musculoskeletal:     Comments: Some tenderness to palpation cervical paraspinous muscles, no midline cervical tenderness.  Patient moves all 4 limbs spontaneously, intact strength 5/5 bilateral upper and lower extremities.  She can ambulate with assistance without much difficulty in context of her age  Skin:    General: Skin is warm and dry.     Capillary Refill: Capillary refill takes less than 2 seconds.     Comments: Patient has small, nonbleeding skin tear on the anterior surface just above right elbow.  No step-off, deformity, no deeper laceration noted.  No other significant skin tears or lacerations noted.  She does have some soft tissue bruising and swelling at the bridge of the nose without obvious deviation.   Neurological:     Mental Status: She is alert and oriented to person, place, and time.     Comments: Cranial nerves II through XII grossly intact.  Intact finger-nose, intact heel-to-shin.  Romberg mildly unsteady, gait deferred, uses walker at baseline.  Alert and oriented x3.  Moves all 4 limbs spontaneously, normal coordination.  No pronator drift.  Intact strength 5 out of 5 bilateral upper and lower extremities.    Psychiatric:        Mood and Affect: Mood normal.        Behavior: Behavior normal.     ED Results / Procedures / Treatments   Labs (all labs ordered are listed, but only abnormal results are displayed) Labs Reviewed  BASIC METABOLIC PANEL - Abnormal; Notable for the following components:      Result Value   Sodium 134 (*)    Glucose, Bld 247 (*)    BUN 25 (*)    All other components within normal limits  CBG MONITORING, ED - Abnormal; Notable for the following components:   Glucose-Capillary 228 (*)    All other components within normal limits  CBC  TROPONIN I (HIGH SENSITIVITY)    EKG EKG Interpretation  Date/Time:  Tuesday November 07 2022 16:40:04 EST Ventricular Rate:  71 PR Interval:  129 QRS Duration: 99 QT Interval:  400 QTC Calculation: 435 R Axis:   44 Text Interpretation: Sinus rhythm Confirmed by Nanda Quinton [id number]) on 11/07/2022 4:42:32 PM  Radiology DG Elbow Complete Right  Result Date: 11/07/2022 CLINICAL DATA:  Bilateral elbow pain after falling. EXAM: RIGHT ELBOW - COMPLETE 3+ VIEW; LEFT ELBOW - COMPLETE 3+ VIEW COMPARISON:  Left forearm radiographs 12/07/2013. FINDINGS: Right elbow: The bones are demineralized. No displaced fracture or dislocation identified. Mild spurring of the lateral humeral epicondyle. The joint spaces are preserved. On the lateral view, there is possible mild up lifting of the anterior fat pad, and a small joint effusion is difficult to exclude. There is focal soft tissue calcification posterior to the distal  humerus. Left elbow: The bones are demineralized. No evidence of acute fracture, dislocation or elbow joint effusion. There is spurring of the coronoid process and lateral humeral epicondyle. IMPRESSION: 1. No evidence of acute displaced fracture or dislocation in either elbow. 2. Possible small right elbow joint effusion which could be a manifestation of occult osseous injury. 3. Osteopenia and degenerative changes as described. Electronically Signed   By: Richardean Sale M.D.   On: 11/07/2022 16:44   DG Elbow Complete Left  Result Date: 11/07/2022 CLINICAL DATA:  Bilateral elbow pain after falling. EXAM: RIGHT ELBOW - COMPLETE 3+ VIEW; LEFT ELBOW - COMPLETE 3+ VIEW COMPARISON:  Left forearm radiographs 12/07/2013. FINDINGS: Right elbow: The bones are demineralized. No displaced fracture or dislocation identified. Mild spurring of the lateral humeral epicondyle. The joint spaces are preserved. On the lateral view, there is possible mild up lifting of the anterior fat pad, and a small joint effusion is difficult to exclude. There is focal soft tissue calcification posterior to the distal humerus. Left elbow: The bones are demineralized. No evidence of acute fracture, dislocation or elbow joint effusion. There is spurring of the coronoid process and lateral humeral epicondyle. IMPRESSION: 1. No evidence of acute displaced fracture or dislocation in either elbow. 2. Possible small right elbow joint effusion which could be a manifestation of occult osseous injury. 3. Osteopenia and degenerative changes as described. Electronically Signed   By: Carey Bullocks M.D.   On: 11/07/2022 16:44   DG Shoulder Left  Result Date: 11/07/2022 CLINICAL DATA:  Pain after fall EXAM: LEFT SHOULDER - 3 VIEW COMPARISON:  X-ray 01/03/2021 FINDINGS: Osteopenia. No acute fracture or dislocation. Joint space loss of the [facility] joint and glenohumeral joint. Osteophytes are seen. Calcifications along the shoulder from calcific tendonitis  or chondrocalcinosis. IMPRESSION: Osteopenia with degenerative changes. Electronically Signed   By: Karen Kays M.D.   On: 11/07/2022 16:39   CT Maxillofacial Wo Contrast  Result Date: 11/07/2022 CLINICAL DATA:  Fall EXAM: CT MAXILLOFACIAL WITHOUT CONTRAST CT CERVICAL SPINE WITHOUT CONTRAST TECHNIQUE: Multidetector CT imaging of the maxillofacial structures was performed. Multiplanar CT image reconstructions were also generated. A small metallic BB was placed on the right temple in order to reliably differentiate right from left. Multidetector CT imaging of the cervical spine was performed without intravenous contrast. Multiplanar CT image reconstructions were also generated. RADIATION DOSE REDUCTION: This exam was performed according to the departmental dose-optimization program which includes automated exposure control, adjustment of the mA and/or kV according to patient size and/or use of iterative reconstruction technique. COMPARISON:  CT head and cervical spine 05/02/2022 FINDINGS: CT MAXILLOFACIAL FINDINGS Osseous: There is no acute facial bone fracture. There is no evidence of mandibular dislocation. There is no suspicious osseous lesion. Orbits: Bilateral lens implants are in place. The globes are intact. There is no retrobulbar hematoma. Sinuses: The paranasal sinuses are clear. Soft tissues: Unremarkable. Limited intracranial: Assessed on the separately dictated CT head. CT CERVICAL FINDINGS Alignment: Normal. There is no significant antero or retrolisthesis. There is no jumped or perched facet or other evidence of traumatic malalignment. Skull base and vertebrae: Skull base alignment is maintained. Vertebral body heights are preserved. There is no evidence of acute fracture. There is no suspicious osseous lesion. Soft tissues and spinal canal: No prevertebral fluid or swelling. No visible canal hematoma. Disc levels: There is disc space narrowing and degenerative endplate change most advanced at  C4-C5 and C5-C6. Facet arthropathy is most advanced on the left at C3-C4 and bilaterally at C4-C5 and C5-C6. There is probable multilevel moderate spinal canal stenosis. Findings are overall unchanged. Upper chest: The imaged lung apices are clear. Other: The right thyroid lobe is enlarged with a nodule measuring up to 3.3 cm. This has been previously evaluated by ultrasound. IMPRESSION: 1. No acute fracture or traumatic malalignment of the cervical spine. 2. No acute facial bone fracture. 3. CT head is reported separately. Electronically Signed   By: Lesia Hausen M.D.   On: 11/07/2022 16:03   CT Cervical Spine Wo Contrast  Result Date: 11/07/2022 CLINICAL DATA:  Fall EXAM: CT MAXILLOFACIAL WITHOUT CONTRAST CT CERVICAL SPINE WITHOUT CONTRAST TECHNIQUE:  Multidetector CT imaging of the maxillofacial structures was performed. Multiplanar CT image reconstructions were also generated. A small metallic BB was placed on the right temple in order to reliably differentiate right from left. Multidetector CT imaging of the cervical spine was performed without intravenous contrast. Multiplanar CT image reconstructions were also generated. RADIATION DOSE REDUCTION: This exam was performed according to the departmental dose-optimization program which includes automated exposure control, adjustment of the mA and/or kV according to patient size and/or use of iterative reconstruction technique. COMPARISON:  CT head and cervical spine 05/02/2022 FINDINGS: CT MAXILLOFACIAL FINDINGS Osseous: There is no acute facial bone fracture. There is no evidence of mandibular dislocation. There is no suspicious osseous lesion. Orbits: Bilateral lens implants are in place. The globes are intact. There is no retrobulbar hematoma. Sinuses: The paranasal sinuses are clear. Soft tissues: Unremarkable. Limited intracranial: Assessed on the separately dictated CT head. CT CERVICAL FINDINGS Alignment: Normal. There is no significant antero or  retrolisthesis. There is no jumped or perched facet or other evidence of traumatic malalignment. Skull base and vertebrae: Skull base alignment is maintained. Vertebral body heights are preserved. There is no evidence of acute fracture. There is no suspicious osseous lesion. Soft tissues and spinal canal: No prevertebral fluid or swelling. No visible canal hematoma. Disc levels: There is disc space narrowing and degenerative endplate change most advanced at C4-C5 and C5-C6. Facet arthropathy is most advanced on the left at C3-C4 and bilaterally at C4-C5 and C5-C6. There is probable multilevel moderate spinal canal stenosis. Findings are overall unchanged. Upper chest: The imaged lung apices are clear. Other: The right thyroid lobe is enlarged with a nodule measuring up to 3.3 cm. This has been previously evaluated by ultrasound. IMPRESSION: 1. No acute fracture or traumatic malalignment of the cervical spine. 2. No acute facial bone fracture. 3. CT head is reported separately. Electronically Signed   By: Valetta Mole M.D.   On: 11/07/2022 16:03   CT Head Wo Contrast  Result Date: 11/07/2022 CLINICAL DATA:  Head trauma EXAM: CT HEAD WITHOUT CONTRAST TECHNIQUE: Contiguous axial images were obtained from the base of the skull through the vertex without intravenous contrast. RADIATION DOSE REDUCTION: This exam was performed according to the departmental dose-optimization program which includes automated exposure control, adjustment of the mA and/or kV according to patient size and/or use of iterative reconstruction technique. COMPARISON:  MRI brain 01/04/2021. FINDINGS: Brain: No evidence of acute infarction, hemorrhage, hydrocephalus, extra-axial collection or mass lesion/mass effect. There is mild diffuse atrophy and mild periventricular white matter hypodensity, likely chronic small vessel ischemic change. Vascular: Atherosclerotic calcifications are present within the cavernous internal carotid arteries. Skull:  Normal. Negative for fracture or focal lesion. Sinuses/Orbits: No acute finding. Other: None. IMPRESSION: 1. No acute intracranial process. 2. Mild diffuse atrophy and mild chronic small vessel ischemic change. Electronically Signed   By: Ronney Asters M.D.   On: 11/07/2022 15:56    Procedures Procedures    Medications Ordered in ED Medications  acetaminophen (TYLENOL) tablet 650 mg (650 mg Oral Given 11/07/22 1625)  Tdap (BOOSTRIX) injection 0.5 mL (0.5 mLs Intramuscular Given 11/07/22 1626)    ED Course/ Medical Decision Making/ A&P                             Medical Decision Making Amount and/or Complexity of Data Reviewed Labs: ordered. Radiology: ordered.  Risk OTC drugs. Prescription drug management.   This patient is a  89 y.o. female  who presents to the ED for concern of fall, with questionable reason for fall, she does ambulate with a walker at baseline, she reports that she felt mildly dizzy coming out of the shower but not significantly changed from baseline, she is concerned that she tripped.  She denies syncope when I discussed the episode with her.  She denies true loss of consciousness.  She hit the corner of the shower the bridge of the nose, and endorses some head pain, nose pain, skin tear on right upper arm.  She denies any dizziness at time of my evaluation.  She does not take any blood thinners..   Differential diagnoses prior to evaluation: The emergent differential diagnosis includes, but is not limited to,  Intracranial hemorrhage, meningitis, CVA, intracranial tumor, venous sinus thrombosis, migraine, cluster headache, hypertension, drug related, pseudotumor cerebri, AVM, head injury, tension headache, sinusitis, dental abscess, otitis media, TMJ, depression, temporal arteritis, glaucoma, trigeminal neuralgia, but overall have entire concern for traumatic injuries, ICH, facial bone fracture, other acute fracture, dislocation, cardiogenic, neurogenic cause of  syncope, dehydration, versus other. This is not an exhaustive differential.   Past Medical History / Co-morbidities: Previous history of hypertension, diabetes, TIA, advanced age  Physical Exam: Physical exam performed. The pertinent findings include: Patient does have some bruising along the bridge of the nose and soft tissue swelling but no deviation noted.  She is somewhat hypertensive in the emergency department with blood pressure max of 174/67, but no tachycardia, no fever, normal oxygen saturation on room air.  She has no significant focal neurologic deficits.  She does have a skin tear on the right arm without any active bleeding, no evidence of a deeper laceration.  She is some tenderness to bilateral elbows, left shoulder, head, and neck without any step-off, deformity, evidence of unstable neck fracture.  She has normal range of motion of all of the extremities, small effusion was noted on imaging of the right elbow, but patient with normal range of motion of the right elbow, and effusion is small enough to be nonvisible on physical exam.  Lab Tests/Imaging studies: I personally interpreted labs/imaging and the pertinent results include: CBC unremarkable, troponin less than 18 x 1 in context of no active chest pain.  Her BMP shows some hyperglycemia, glucose 247 with pseudohyponatremia, sodium 134, True Value 1 35-6.  BUN is mildly elevated 25 but no evidence of acute kidney injury..  I dependently interpreted plain film radiograph of the right elbow, left elbow, left shoulder, CT maxillofacial, CT C-spine and CT head, which showed no acute fracture, dislocation, acute intracranial injury, there is a small effusion around the right elbow which could be suspicious for an occult fracture, however based on her physical exam I have an overall low suspicion.  In context of a 89 year old who uses both arms to ambulate with a walker I would not place her in any splint or sling today, but discussed that  she should return for repeat imaging if she has persistent pain in the right elbow.  I agree with the radiologist interpretation.  Cardiac monitoring: EKG obtained and interpreted by my attending physician which shows: NSR   Medications: I ordered medication including Tylenol for pain, Tdap for tetanus prophylaxis given small skin tear right arm.  I have reviewed the patients home medicines and have made adjustments as needed.   Disposition: After consideration of the diagnostic results and the patients response to treatment, I feel that patient with some soft tissue  swelling, bruising, and small skin tear but no evidence of acute fracture, dislocation, occult cardiogenic or neurogenic syncopal event.   emergency department workup does not suggest an emergent condition requiring admission or immediate intervention beyond what has been performed at this time. The plan is: as above, encouraged Tylenol, rest for musculoskeletal injuries. The patient is safe for discharge and has been instructed to return immediately for worsening symptoms, change in symptoms or any other concerns.  Final Clinical Impression(s) / ED Diagnoses Final diagnoses:  Fall, initial encounter  Injury of nose, initial encounter  Skin tear of right elbow without complication, initial encounter  Acute pain of left shoulder  Elbow pain, left    Rx / DC Orders ED Discharge Orders     None         Olene Floss, PA-C 11/07/22 1906    Loetta Rough, MD 11/08/22 1015

## 2022-11-07 NOTE — Discharge Instructions (Addendum)
Please use Tylenol for pain.  You may use 1000 mg of Tylenol every 6 hours.  Not to exceed 4 g of Tylenol within 24 hours.  If you are having significant ongoing right elbow pain despite treatment, as we discussed based on your imaging today it may represent a very small fracture that was missed on initial imaging, if you are suspicious, or having significant pain out of proportion to what she would expect from a nonbroken bone on the right elbow please return for follow-up imaging with your primary care, orthopedics, or to the emergency department.

## 2022-11-07 NOTE — ED Notes (Signed)
Rt elbow dressing placed to skin tear

## 2022-11-07 NOTE — ED Triage Notes (Addendum)
Pt presents with complaint of fall. Approx 0530 laid in floor until prior to son arriving at 49 Reports she stepped out of shower, became dizzy and passed out. Hits corner of shower with bridge of nose . Bruising and swelling noted to nose. Son says she complained of head pain. Skin tear noted to right upper arm. No dizziness at present  Alert and oriented. Denies blood thinners

## 2022-11-27 ENCOUNTER — Encounter (HOSPITAL_BASED_OUTPATIENT_CLINIC_OR_DEPARTMENT_OTHER): Payer: Self-pay

## 2022-11-27 ENCOUNTER — Emergency Department (HOSPITAL_BASED_OUTPATIENT_CLINIC_OR_DEPARTMENT_OTHER): Payer: Medicare Other

## 2022-11-27 ENCOUNTER — Other Ambulatory Visit: Payer: Self-pay

## 2022-11-27 ENCOUNTER — Inpatient Hospital Stay (HOSPITAL_BASED_OUTPATIENT_CLINIC_OR_DEPARTMENT_OTHER)
Admission: EM | Admit: 2022-11-27 | Discharge: 2022-12-04 | DRG: 689 | Disposition: A | Discharge status: Skilled Nursing Facility | Payer: Medicare Other | Attending: Internal Medicine | Admitting: Internal Medicine

## 2022-11-27 DIAGNOSIS — Z881 Allergy status to other antibiotic agents status: Secondary | ICD-10-CM

## 2022-11-27 DIAGNOSIS — G9341 Metabolic encephalopathy: Secondary | ICD-10-CM | POA: Diagnosis present

## 2022-11-27 DIAGNOSIS — Z87440 Personal history of urinary (tract) infections: Secondary | ICD-10-CM

## 2022-11-27 DIAGNOSIS — E119 Type 2 diabetes mellitus without complications: Secondary | ICD-10-CM

## 2022-11-27 DIAGNOSIS — Z8249 Family history of ischemic heart disease and other diseases of the circulatory system: Secondary | ICD-10-CM

## 2022-11-27 DIAGNOSIS — Z7982 Long term (current) use of aspirin: Secondary | ICD-10-CM

## 2022-11-27 DIAGNOSIS — F411 Generalized anxiety disorder: Secondary | ICD-10-CM | POA: Diagnosis present

## 2022-11-27 DIAGNOSIS — Z1152 Encounter for screening for COVID-19: Secondary | ICD-10-CM

## 2022-11-27 DIAGNOSIS — R471 Dysarthria and anarthria: Secondary | ICD-10-CM | POA: Diagnosis present

## 2022-11-27 DIAGNOSIS — K219 Gastro-esophageal reflux disease without esophagitis: Secondary | ICD-10-CM | POA: Diagnosis present

## 2022-11-27 DIAGNOSIS — E1165 Type 2 diabetes mellitus with hyperglycemia: Secondary | ICD-10-CM | POA: Diagnosis present

## 2022-11-27 DIAGNOSIS — Z887 Allergy status to serum and vaccine status: Secondary | ICD-10-CM

## 2022-11-27 DIAGNOSIS — R410 Disorientation, unspecified: Secondary | ICD-10-CM

## 2022-11-27 DIAGNOSIS — N19 Unspecified kidney failure: Secondary | ICD-10-CM | POA: Diagnosis present

## 2022-11-27 DIAGNOSIS — E1142 Type 2 diabetes mellitus with diabetic polyneuropathy: Secondary | ICD-10-CM | POA: Diagnosis present

## 2022-11-27 DIAGNOSIS — Z681 Body mass index (BMI) 19 or less, adult: Secondary | ICD-10-CM

## 2022-11-27 DIAGNOSIS — I69354 Hemiplegia and hemiparesis following cerebral infarction affecting left non-dominant side: Secondary | ICD-10-CM

## 2022-11-27 DIAGNOSIS — N390 Urinary tract infection, site not specified: Secondary | ICD-10-CM | POA: Diagnosis not present

## 2022-11-27 DIAGNOSIS — I10 Essential (primary) hypertension: Secondary | ICD-10-CM | POA: Diagnosis present

## 2022-11-27 DIAGNOSIS — Z8673 Personal history of transient ischemic attack (TIA), and cerebral infarction without residual deficits: Secondary | ICD-10-CM

## 2022-11-27 DIAGNOSIS — R2990 Unspecified symptoms and signs involving the nervous system: Secondary | ICD-10-CM | POA: Diagnosis present

## 2022-11-27 DIAGNOSIS — E7800 Pure hypercholesterolemia, unspecified: Secondary | ICD-10-CM | POA: Diagnosis present

## 2022-11-27 DIAGNOSIS — F40240 Claustrophobia: Secondary | ICD-10-CM | POA: Diagnosis present

## 2022-11-27 DIAGNOSIS — R29810 Facial weakness: Secondary | ICD-10-CM | POA: Diagnosis present

## 2022-11-27 DIAGNOSIS — E785 Hyperlipidemia, unspecified: Secondary | ICD-10-CM | POA: Diagnosis present

## 2022-11-27 DIAGNOSIS — R64 Cachexia: Secondary | ICD-10-CM | POA: Diagnosis present

## 2022-11-27 DIAGNOSIS — G250 Essential tremor: Secondary | ICD-10-CM | POA: Diagnosis present

## 2022-11-27 DIAGNOSIS — E871 Hypo-osmolality and hyponatremia: Secondary | ICD-10-CM | POA: Diagnosis present

## 2022-11-27 DIAGNOSIS — Z90710 Acquired absence of both cervix and uterus: Secondary | ICD-10-CM

## 2022-11-27 DIAGNOSIS — H9190 Unspecified hearing loss, unspecified ear: Secondary | ICD-10-CM | POA: Diagnosis present

## 2022-11-27 DIAGNOSIS — Z79899 Other long term (current) drug therapy: Secondary | ICD-10-CM

## 2022-11-27 LAB — COMPREHENSIVE METABOLIC PANEL
ALT: 20 U/L (ref 0–44)
AST: 23 U/L (ref 15–41)
Albumin: 3.5 g/dL (ref 3.5–5.0)
Alkaline Phosphatase: 94 U/L (ref 38–126)
Anion gap: 8 (ref 5–15)
BUN: 30 mg/dL — ABNORMAL HIGH (ref 8–23)
CO2: 24 mmol/L (ref 22–32)
Calcium: 8.8 mg/dL — ABNORMAL LOW (ref 8.9–10.3)
Chloride: 98 mmol/L (ref 98–111)
Creatinine, Ser: 0.9 mg/dL (ref 0.44–1.00)
GFR, Estimated: >60 mL/min (ref >60)
Glucose, Bld: 163 mg/dL — ABNORMAL HIGH (ref 70–99)
Potassium: 4 mmol/L (ref 3.5–5.1)
Sodium: 130 mmol/L — ABNORMAL LOW (ref 135–145)
Total Bilirubin: 0.6 mg/dL (ref 0.3–1.2)
Total Protein: 6.1 g/dL — ABNORMAL LOW (ref 6.5–8.1)

## 2022-11-27 LAB — CBC WITH DIFFERENTIAL/PLATELET
Abs Immature Granulocytes: 0.03 10*3/uL (ref 0.00–0.07)
Basophils Absolute: 0 10*3/uL (ref 0.0–0.1)
Basophils Relative: 0 %
Eosinophils Absolute: 0.1 10*3/uL (ref 0.0–0.5)
Eosinophils Relative: 1 %
HCT: 33.3 % — ABNORMAL LOW (ref 36.0–46.0)
Hemoglobin: 11.2 g/dL — ABNORMAL LOW (ref 12.0–15.0)
Immature Granulocytes: 0 %
Lymphocytes Relative: 26 %
Lymphs Abs: 1.9 10*3/uL (ref 0.7–4.0)
MCH: 29.6 pg (ref 26.0–34.0)
MCHC: 33.6 g/dL (ref 30.0–36.0)
MCV: 88.1 fL (ref 80.0–100.0)
Monocytes Absolute: 0.7 10*3/uL (ref 0.1–1.0)
Monocytes Relative: 9 %
Neutro Abs: 4.5 10*3/uL (ref 1.7–7.7)
Neutrophils Relative %: 64 %
Platelets: 232 10*3/uL (ref 150–400)
RBC: 3.78 MIL/uL — ABNORMAL LOW (ref 3.87–5.11)
RDW: 14.4 % (ref 11.5–15.5)
WBC: 7.2 10*3/uL (ref 4.0–10.5)
nRBC: 0 % (ref 0.0–0.2)

## 2022-11-27 LAB — TROPONIN I (HIGH SENSITIVITY)
Troponin I (High Sensitivity): 36 ng/L — ABNORMAL HIGH (ref <18)
Troponin I (High Sensitivity): 33 ng/L — ABNORMAL HIGH (ref <18)

## 2022-11-27 LAB — RESP PANEL BY RT-PCR (RSV, FLU A&B, COVID)  RVPGX2
Influenza A by PCR: NEGATIVE
Influenza B by PCR: NEGATIVE
Resp Syncytial Virus by PCR: NEGATIVE
SARS Coronavirus 2 by RT PCR: NEGATIVE

## 2022-11-27 LAB — CBG MONITORING, ED: Glucose-Capillary: 196 mg/dL — ABNORMAL HIGH (ref 70–99)

## 2022-11-27 MED ORDER — PROPRANOLOL HCL 10 MG PO TABS
10.0000 mg | ORAL_TABLET | Freq: Once | ORAL | Status: DC
  Filled 2022-11-27: qty 1

## 2022-11-27 MED ORDER — BENAZEPRIL HCL 20 MG PO TABS
20.0000 mg | ORAL_TABLET | Freq: Once | ORAL | Status: DC
  Filled 2022-11-27: qty 1

## 2022-11-27 MED ORDER — AMLODIPINE BESYLATE 5 MG PO TABS
5.0000 mg | ORAL_TABLET | Freq: Once | ORAL | Status: AC
  Administered 2022-11-28: 5 mg via ORAL
  Filled 2022-11-27: qty 1

## 2022-11-27 MED ORDER — SODIUM CHLORIDE 0.9 % IV BOLUS
1000.0000 mL | Freq: Once | INTRAVENOUS | Status: AC
  Administered 2022-11-27: 1000 mL via INTRAVENOUS

## 2022-11-27 MED ORDER — METFORMIN HCL 500 MG PO TABS
500.0000 mg | ORAL_TABLET | Freq: Once | ORAL | Status: AC
  Administered 2022-11-28: 500 mg via ORAL
  Filled 2022-11-27: qty 1

## 2022-11-27 NOTE — ED Notes (Signed)
Notified provider of pt BP 180/65

## 2022-11-27 NOTE — ED Notes (Signed)
Pt placed on bedpan due to son stating she might need to have a bowel movement. Pt placed on a bedpan with [facility] assistance. Pt urinated in the bedpan but no bowel movement. Pt got her gown wet in the process and peri care was preformed and pt was placed in a clean gown. A clean pur wick was placed on the pt.

## 2022-11-27 NOTE — Plan of Care (Signed)
[facility] will assume care on arrival to accepting facility. Until arrival, care as per EDP. However, [facility] available 24/7 for questions and assistance.  Nursing staff, please page [facility] Admits and Consults ([phone]) as soon as the patient arrives to the hospital.   

## 2022-11-27 NOTE — ED Notes (Signed)
Pt's son came to nurse's station to notify RN that she is having some acute changes including ascending numbness to right hand and arm, right facial droop, and slurred speech. PA at bedside is aware.

## 2022-11-27 NOTE — ED Notes (Addendum)
OK per RN Cinda Quest for pt to have something to eat. Pt given a frozen dinner chicken and dressing and mashed potatoes. Pt requesting decaf coffee and pt given the same. Pt son also requesting artificial sweetner and 3 creams. Son given the same.

## 2022-11-27 NOTE — ED Triage Notes (Signed)
Brought in with sons who state she was noted to be confused at 1215 this afternoon. States she was saying she was at a hotel with other people however she was at home alone.   Per son, was fine at 2100 last night and was normal, noted to be slightly confused with some slurring of speech at 2200 on phone with son again last night. LKW 2100 yesterday. Oriented to place and situation, however is disoriented to time.  Son states had some diarrhea last night. Hx TIA.

## 2022-11-27 NOTE — ED Notes (Signed)
Pt family member spoke with this RN to report that she is having increased numbness in the arm (unable to state which side) and that is is now going into her LT face. PA Alexander made aware and he is at the bedside with the patient and primary RN.

## 2022-11-27 NOTE — ED Provider Notes (Signed)
[facility] Provider Note   CSN: [id number] Arrival date & time: 11/27/22  1613     History  Chief Complaint  Patient presents with   Altered Mental Status    Kiara Blair is a 89 y.o. female.  With a history of left-sided paresthesias, TIA, hypertension, diabetes, previous stroke, essential tremor who presents to the ED for evaluation of altered mental status.  One of her sons reports that they called her approximately 9 PM last night and she was slurring her words.  She has a history of doing this very often due to left-sided paresthesias that radiate into the left side of her face.  She has had this since she was a child.  Her mother son called her today to explain noon and stated that she was telling stories that did not make sense including recently living in a hotel and traveling around the country.  This is not normal for patient and this is the reason for her presentation today.  Both sons state that this is similar to when she acts when she has a urinary tract infection.  Her last known normal was Friday afternoon.  On Saturday her son found her on the toilet because she was unable to stand up due to weakness.  Her weakness has improved some and she is able to ambulate with a walker today but still has some of this.  Patient denies all other symptoms including chest pain, shortness of breath, cough, dizziness, lightheadedness, abdominal pain, nausea, vomiting, urinary symptoms, headache, vision changes.  She is not anticoagulated.  She denies falls.  History was provided by the patient and her 2 sons   Altered Mental Status      Home Medications Prior to Admission medications   Medication Sig Start Date End Date Taking? Authorizing Provider  acetaminophen (TYLENOL) 325 MG tablet Take 2 tablets (650 mg total) by mouth every 6 (six) hours as needed. 12/06/21   Jeanell Sparrow, DO  amLODipine-benazepril (LOTREL) 5-20 MG per capsule Take 1  capsule by mouth daily.    [provider]  aspirin EC 81 MG tablet Take 1 tablet (81 mg total) by mouth daily. Swallow whole. 01/05/21   Domenic Polite, MD  atorvastatin (LIPITOR) 40 MG tablet Take 1 tablet (40 mg total) by mouth daily. 02/08/21   Frann Rider, NP  KAZANO 12.5-500 MG TABS Take 1 tablet by mouth 2 (two) times daily. 04/21/20   [provider]  lidocaine (XYLOCAINE) 5 % ointment Apply 1 Application topically 4 (four) times daily as needed for moderate pain. 07/31/22   Long, Wonda Olds, MD  Multiple Vitamin (MULTIVITAMIN WITH MINERALS) TABS tablet Take 1 tablet by mouth daily. Dr. Lenon Oms Vitamins.    [provider]  propranolol (INDERAL) 10 MG tablet Take 0.5 tablets (5 mg total) by mouth 2 (two) times daily. 01/05/21   Domenic Polite, MD      Allergies    Doxycycline hyclate, Levofloxacin, Meloxicam, Naproxen-esomeprazole mg, Nitrofurantoin, Other, and Pneumococcal polysaccharide vaccine    Review of Systems   Review of Systems  Constitutional:  Positive for activity change.    Physical Exam Updated Vital Signs BP (!) 183/64   Pulse (!) 59   Temp 97.8 F (36.6 C) (Oral)   Resp 17   Ht 5' 1"$  (1.549 m)   Wt 46.3 kg   SpO2 100%   BMI 19.27 kg/m  Physical Exam Vitals and nursing note reviewed.  Constitutional:  General: She is not in acute distress.    Appearance: She is well-developed.     Comments: Cachectic.  Resting comfortably in bed  HENT:     Head: Normocephalic and atraumatic.  Eyes:     Extraocular Movements: Extraocular movements intact.     Conjunctiva/sclera: Conjunctivae normal.     Pupils: Pupils are equal, round, and reactive to light.  Cardiovascular:     Rate and Rhythm: Normal rate and regular rhythm.     Heart sounds: No murmur heard. Pulmonary:     Effort: Pulmonary effort is normal. No respiratory distress.     Breath sounds: Normal breath sounds. No stridor. No wheezing, rhonchi or rales.  Abdominal:      Palpations: Abdomen is soft.     Tenderness: There is no abdominal tenderness. There is no guarding.  Musculoskeletal:        General: No swelling.     Cervical back: Neck supple. No rigidity.  Skin:    General: Skin is warm and dry.     Capillary Refill: Capillary refill takes less than 2 seconds.  Neurological:     General: No focal deficit present.     Mental Status: She is alert.     Comments: No facial asymmetry, slurred speech, unilateral or global weakness.  Pronator drift difficult to assess due to essential tremor.  finger-to-nose with some ataxia.  Normal heel-to-shin.  Sensation intact in all extremities.  Normal shoulder shrug.  She is alert and oriented to self and place  Psychiatric:        Mood and Affect: Mood normal.     ED Results / Procedures / Treatments   Labs (all labs ordered are listed, but only abnormal results are displayed) Labs Reviewed  COMPREHENSIVE METABOLIC PANEL - Abnormal; Notable for the following components:      Result Value   Sodium 130 (*)    Glucose, Bld 163 (*)    BUN 30 (*)    Calcium 8.8 (*)    Total Protein 6.1 (*)    All other components within normal limits  CBC WITH DIFFERENTIAL/PLATELET - Abnormal; Notable for the following components:   RBC 3.78 (*)    Hemoglobin 11.2 (*)    HCT 33.3 (*)    All other components within normal limits  URINALYSIS, W/ REFLEX TO CULTURE (INFECTION SUSPECTED) - Abnormal; Notable for the following components:   Non Squamous Epithelial PRESENT (*)    Bacteria, UA RARE (*)    All other components within normal limits  CBG MONITORING, ED - Abnormal; Notable for the following components:   Glucose-Capillary 196 (*)    All other components within normal limits  TROPONIN I (HIGH SENSITIVITY) - Abnormal; Notable for the following components:   Troponin I (High Sensitivity) 36 (*)    All other components within normal limits  TROPONIN I (HIGH SENSITIVITY) - Abnormal; Notable for the following components:    Troponin I (High Sensitivity) 33 (*)    All other components within normal limits  RESP PANEL BY RT-PCR (RSV, FLU A&B, COVID)  [id number]    EKG EKG Interpretation  Date/Time:  Monday November 27 2022 16:35:01 EST Ventricular Rate:  69 PR Interval:  132 QRS Duration: 96 QT Interval:  394 QTC Calculation: 423 R Axis:   100 Text Interpretation: Sinus rhythm Consider left atrial enlargement Right axis deviation Minimal ST elevation, anterior leads Confirmed by Regan Lemming ([id number]) on 11/27/2022 10:45:30 PM  Radiology DG Chest [facility] [address]  Result Date: 11/27/2022 CLINICAL DATA:  Altered mental status, confusion EXAM: PORTABLE CHEST 1 VIEW COMPARISON:  04/12/2022 FINDINGS: Atherosclerotic calcification of the aortic arch. Thoracic spondylosis noted. Heart size within normal limits. The lungs appear clear. Substantial degenerative glenohumeral arthropathy, left greater than right. No blunting of the costophrenic angles. IMPRESSION: 1. No active cardiopulmonary disease is radiographically apparent. 2. Thoracic spondylosis. 3. Substantial degenerative glenohumeral arthropathy, left greater than right. Electronically Signed   By: Van Clines M.D.   On: 11/27/2022 18:57   CT Head Wo Contrast  Result Date: 11/27/2022 CLINICAL DATA:  Mental status change of unknown cause.  Confusion. EXAM: CT HEAD WITHOUT CONTRAST TECHNIQUE: Contiguous axial images were obtained from the base of the skull through the vertex without intravenous contrast. RADIATION DOSE REDUCTION: This exam was performed according to the departmental dose-optimization program which includes automated exposure control, adjustment of the mA and/or kV according to patient size and/or use of iterative reconstruction technique. COMPARISON:  11/07/2022 FINDINGS: Brain: No change. No acute finding. Age related volume loss and mild chronic small-vessel change of the white matter. No large vessel infarction. No mass, hemorrhage, hydrocephalus  or extra-axial collection. Vascular: There is atherosclerotic calcification of the major vessels at the base of the brain. Skull: Normal Sinuses/Orbits: Clear/normal Other: None IMPRESSION: No acute or reversible finding. Age related volume loss and mild chronic small-vessel change of the white matter. Electronically Signed   By: Nelson Chimes M.D.   On: 11/27/2022 18:56    Procedures Procedures    Medications Ordered in ED Medications  amLODipine (NORVASC) tablet 5 mg (has no administration in time range)  benazepril (LOTENSIN) tablet 20 mg (has no administration in time range)  propranolol (INDERAL) tablet 10 mg (has no administration in time range)  metFORMIN (GLUCOPHAGE) tablet 500 mg (has no administration in time range)  sodium chloride 0.9 % bolus 1,000 mL (1,000 mLs Intravenous Bolus 11/27/22 2136)    ED Course/ Medical Decision Making/ A&P Clinical Course as of 11/27/22 2358  Mon Nov 27, 2022  2115 Spoke with neurology.  They are requesting MRI of the brain and admission to hospitalist for altered mental status [AS]  2236 Was called to bedside by patient's son.  States that her right side has begun to get weak and she was having difficulty eating.  On examination, she is more lethargic and her tremor has increased.  Repeat CT head ordered [AS]    Clinical Course User Index [AS] Calvin Chura, [place] Folk, PA-C                             Medical Decision Making Amount and/or Complexity of Data Reviewed Labs: ordered. Radiology: ordered.  Risk Decision regarding hospitalization.  This patient presents to the ED for concern of altered mental status, this involves an extensive number of treatment options, and is a complaint that carries with it a high risk of complications and morbidity. The differential diagnosis for AMS is extensive and includes, but is not limited to: drug overdose - opioids, alcohol, sedatives, antipsychotics, drug withdrawal, others; Metabolic: hypoxia,  hypoglycemia, hyperglycemia, hypercalcemia, hypernatremia, hyponatremia, uremia, hepatic encephalopathy, hypothyroidism, hyperthyroidism, vitamin B12 or thiamine deficiency, carbon monoxide poisoning, Wilson's disease, Lactic acidosis, DKA/HHOS; Infectious: meningitis, encephalitis, bacteremia/sepsis, urinary tract infection, pneumonia, neurosyphilis; Structural: Space-occupying lesion, (brain tumor, subdural hematoma, hydrocephalus,); Vascular: stroke, subarachnoid hemorrhage, coronary ischemia, hypertensive encephalopathy, CNS vasculitis, thrombotic thrombocytopenic purpura, disseminated intravascular coagulation, hyperviscosity; Psychiatric: Schizophrenia, depression; Other: Seizure, hypothermia, heat stroke,  ICU psychosis, dementia -"sundowning."   Co morbidities that complicate the patient evaluation  left-sided paresthesias, TIA, hypertension, diabetes, previous stroke, essential tremor  My initial workup includes labs, imaging and EKG  Additional history obtained from: Nursing notes from this visit. Previous records within EMR system ED visit for fall on 11/07/2022  I ordered, reviewed and interpreted labs which include: CBC, CMP, respiratory panel, urinalysis, troponin.  Initial troponin elevated to 36 with a delta troponin of 33.  CMP significant for hyponatremia of 130, hyperglycemia of 163, elevated BUN to 30.  CBC significant for minor anemia of 11.2  I ordered imaging studies including CT head, chest x-ray I independently visualized and interpreted imaging which showed no acute intracranial or cardiopulmonary abnormalities I agree with the radiologist interpretation  Cardiac Monitoring:  The patient was maintained on a cardiac monitor.  I personally viewed and interpreted the cardiac monitored which showed an underlying rhythm of: NSR  Consultations Obtained:  I requested consultation with the neurology Dr. Cheral Marker,  and discussed lab and imaging findings as well as pertinent plan  - they recommend: Admission to hospitalist, MRI of brain  Afebrile, hypertensive but otherwise hemodynamically stable.  89 year old female presenting to the ED for evaluation of altered mental status.  Last known normal was approximately 3 days ago.  On exam it is difficult to determine any focal neurologic deficits due to her essential tremor status.  Family reports a history of stroke, however patient is not anticoagulated.  They do not know when the stroke was but states it was sometime ago.  Both sons report that patient is typically alert and oriented x 3 and ambulates with a walker.  She does have some left lower extremity weakness compared to the right.  Consulted with neurology who recommended above.  Her symptoms may be secondary to dehydration versus uremia with a BUN of 30 and creatinine of 0.9.  She was treated in the ED with normal saline infusion.  Will be admitted to hospitalist with neurology consult.  MRI of the brain was ordered at the request of neurology.  Her nighttime medications were ordered at the request of the son.  Stable at the time of admission.  Patient's case discussed with Dr. Armandina Gemma who agrees with plan to admit for stroke workup.   Note: Portions of this report may have been transcribed using voice recognition software. Every effort was made to ensure accuracy; however, inadvertent computerized transcription errors may still be present.        Final Clinical Impression(s) / ED Diagnoses Final diagnoses:  Disorientation  Uremia  Stroke-like symptoms    Rx / DC Orders ED Discharge Orders     None         Nehemiah Massed 11/27/22 2358    Regan Lemming, MD 11/28/22 [phone]

## 2022-11-27 NOTE — ED Notes (Signed)
Pt will require a swallow screen prior to eating or drinking anything per MD Lawsing.

## 2022-11-27 NOTE — ED Notes (Signed)
Pt is alert and oriented to self and location. Confused regarding time and birth date.

## 2022-11-28 ENCOUNTER — Encounter (HOSPITAL_COMMUNITY): Payer: Self-pay | Admitting: Internal Medicine

## 2022-11-28 ENCOUNTER — Observation Stay (HOSPITAL_COMMUNITY): Payer: Medicare Other

## 2022-11-28 DIAGNOSIS — E1142 Type 2 diabetes mellitus with diabetic polyneuropathy: Secondary | ICD-10-CM

## 2022-11-28 DIAGNOSIS — I10 Essential (primary) hypertension: Secondary | ICD-10-CM

## 2022-11-28 DIAGNOSIS — R2990 Unspecified symptoms and signs involving the nervous system: Secondary | ICD-10-CM | POA: Diagnosis not present

## 2022-11-28 DIAGNOSIS — E119 Type 2 diabetes mellitus without complications: Secondary | ICD-10-CM

## 2022-11-28 DIAGNOSIS — Z8673 Personal history of transient ischemic attack (TIA), and cerebral infarction without residual deficits: Secondary | ICD-10-CM

## 2022-11-28 DIAGNOSIS — E785 Hyperlipidemia, unspecified: Secondary | ICD-10-CM

## 2022-11-28 LAB — COMPREHENSIVE METABOLIC PANEL
ALT: 18 U/L (ref 0–44)
AST: 22 U/L (ref 15–41)
Albumin: 3.4 g/dL — ABNORMAL LOW (ref 3.5–5.0)
Alkaline Phosphatase: 70 U/L (ref 38–126)
Anion gap: 9 (ref 5–15)
BUN: 16 mg/dL (ref 8–23)
CO2: 25 mmol/L (ref 22–32)
Calcium: 9.2 mg/dL (ref 8.9–10.3)
Chloride: 101 mmol/L (ref 98–111)
Creatinine, Ser: 0.66 mg/dL (ref 0.44–1.00)
GFR, Estimated: >60 mL/min (ref >60)
Glucose, Bld: 220 mg/dL — ABNORMAL HIGH (ref 70–99)
Potassium: 3.7 mmol/L (ref 3.5–5.1)
Sodium: 135 mmol/L (ref 135–145)
Total Bilirubin: 0.8 mg/dL (ref 0.3–1.2)
Total Protein: 5.8 g/dL — ABNORMAL LOW (ref 6.5–8.1)

## 2022-11-28 LAB — CBC
HCT: 37.9 % (ref 36.0–46.0)
Hemoglobin: 12.8 g/dL (ref 12.0–15.0)
MCH: 29.3 pg (ref 26.0–34.0)
MCHC: 33.8 g/dL (ref 30.0–36.0)
MCV: 86.7 fL (ref 80.0–100.0)
Platelets: 242 10*3/uL (ref 150–400)
RBC: 4.37 MIL/uL (ref 3.87–5.11)
RDW: 14.2 % (ref 11.5–15.5)
WBC: 6.2 10*3/uL (ref 4.0–10.5)
nRBC: 0 % (ref 0.0–0.2)

## 2022-11-28 LAB — TSH: TSH: 1.882 u[IU]/mL (ref 0.350–4.500)

## 2022-11-28 LAB — GLUCOSE, CAPILLARY
Glucose-Capillary: 230 mg/dL — ABNORMAL HIGH (ref 70–99)
Glucose-Capillary: 120 mg/dL — ABNORMAL HIGH (ref 70–99)

## 2022-11-28 LAB — MAGNESIUM: Magnesium: 1.4 mg/dL — ABNORMAL LOW (ref 1.7–2.4)

## 2022-11-28 MED ORDER — LORAZEPAM 1 MG PO TABS
1.0000 mg | ORAL_TABLET | Freq: Once | ORAL | Status: AC
  Administered 2022-11-28: 1 mg via ORAL
  Filled 2022-11-28: qty 1

## 2022-11-28 MED ORDER — POLYETHYLENE GLYCOL 3350 17 G PO PACK: 17.0000 g | PACK | Freq: Every day | ORAL | Status: DC | PRN

## 2022-11-28 MED ORDER — SODIUM CHLORIDE 0.9% FLUSH
3.0000 mL | Freq: Two times a day (BID) | INTRAVENOUS | Status: DC
  Administered 2022-11-28 – 2022-12-04 (×13): 3 mL via INTRAVENOUS

## 2022-11-28 MED ORDER — LORAZEPAM 2 MG/ML IJ SOLN
1.0000 mg | Freq: Once | INTRAMUSCULAR | Status: AC | PRN
  Administered 2022-11-28: 1 mg via INTRAVENOUS
  Filled 2022-11-28: qty 1

## 2022-11-28 MED ORDER — ACETAMINOPHEN 650 MG RE SUPP: 650.0000 mg | Freq: Four times a day (QID) | RECTAL | Status: DC | PRN

## 2022-11-28 MED ORDER — ACETAMINOPHEN 325 MG PO TABS
650.0000 mg | ORAL_TABLET | Freq: Four times a day (QID) | ORAL | Status: DC | PRN
  Administered 2022-12-01 – 2022-12-03 (×3): 650 mg via ORAL
  Filled 2022-11-28 (×3): qty 2

## 2022-11-28 MED ORDER — ATORVASTATIN CALCIUM 40 MG PO TABS
40.0000 mg | ORAL_TABLET | Freq: Every day | ORAL | Status: DC
  Administered 2022-11-29 – 2022-12-04 (×6): 40 mg via ORAL
  Filled 2022-11-28 (×7): qty 1

## 2022-11-28 MED ORDER — INSULIN ASPART 100 UNIT/ML IJ SOLN
0.0000 [IU] | INTRAMUSCULAR | Status: DC
  Administered 2022-11-28: 3 [IU] via SUBCUTANEOUS
  Administered 2022-11-29: 2 [IU] via SUBCUTANEOUS
  Administered 2022-11-29: 5 [IU] via SUBCUTANEOUS
  Administered 2022-11-29: 1 [IU] via SUBCUTANEOUS

## 2022-11-28 MED ORDER — ENOXAPARIN SODIUM 30 MG/0.3ML IJ SOSY
30.0000 mg | PREFILLED_SYRINGE | INTRAMUSCULAR | Status: DC
  Administered 2022-11-28 – 2022-12-03 (×6): 30 mg via SUBCUTANEOUS
  Filled 2022-11-28 (×6): qty 0.3

## 2022-11-28 MED ORDER — ASPIRIN 81 MG PO TBEC
81.0000 mg | DELAYED_RELEASE_TABLET | Freq: Every day | ORAL | Status: DC
  Administered 2022-11-29 – 2022-12-04 (×6): 81 mg via ORAL
  Filled 2022-11-28 (×7): qty 1

## 2022-11-28 NOTE — Evaluation (Signed)
Physical Therapy Evaluation Patient Details Name: Kiara Blair MRN: [id number] DOB: [birth year] Today's Date: 11/28/2022  History of Present Illness  89 y.o. female presents to [facility] hospital on 11/27/2022 with AMS and weakness. In ED pt with R weakness and difficulty eating. PMH: DM2, HTN, RUE tremor, CVA.  Clinical Impression  Pt presents to PT with deficits in functional mobility, gait, balance, endurance, safety awareness. Pt is generally weak and requires assistance to maintain balance when ambulating due to frequent R lateral  drift. Pt has had multiple falls within the last year, sustaining multiple injuries due to falls. Pt will benefit from further gait and balance training in an effort to reduce falls risk. PT recommends SNF placement at this time to aide in improving stability. Pt's son reports the pt may need to transition to long term care if possible.     Recommendations for follow up therapy are one component of a multi-disciplinary discharge planning process, led by the attending physician.  Recommendations may be updated based on patient status, additional functional criteria and insurance authorization.  Follow Up Recommendations Skilled nursing-short term rehab (<3 hours/day) Can patient physically be transported by private vehicle: Yes    Assistance Recommended at Discharge Frequent or constant Supervision/Assistance  Patient can return home with the following  A little help with walking and/or transfers;A little help with bathing/dressing/bathroom;Assistance with cooking/housework;Direct supervision/assist for medications management;Direct supervision/assist for financial management;Assist for transportation;Help with stairs or ramp for entrance    Equipment Recommendations BSC/3in1  Recommendations for Other Services       Functional Status Assessment Patient has had a recent decline in their functional status and demonstrates the ability to make significant improvements in  function in a reasonable and predictable amount of time.     Precautions / Restrictions Precautions Precautions: Fall Precaution Comments: essential tremor Restrictions Weight Bearing Restrictions: No      Mobility  Bed Mobility Overal bed mobility: Needs Assistance Bed Mobility: Supine to Sit, Sit to Supine     Supine to sit: Supervision Sit to supine: Min assist        Transfers Overall transfer level: Needs assistance Equipment used: Rolling walker (2 wheels) Transfers: Sit to/from Stand Sit to Stand: Min assist                Ambulation/Gait Ambulation/Gait assistance: Mod assist Gait Distance (Feet): 80 Feet Assistive device: Rolling walker (2 wheels) Gait Pattern/deviations: Step-through pattern, Drifts right/left Gait velocity: reduced Gait velocity interpretation: <1.31 ft/sec, indicative of household ambulator   General Gait Details: pt with lateral drift to right side, requires assistance from PT to correct and prevent potential loss of balance/fall. Pt with narrowed BOS often.  Stairs            Wheelchair Mobility    Modified Rankin (Stroke Patients Only) Modified Rankin (Stroke Patients Only) Pre-Morbid Rankin Score: Moderate disability Modified Rankin: Moderately severe disability     Balance Overall balance assessment: Needs assistance Sitting-balance support: No upper extremity supported, Feet supported Sitting balance-Leahy Scale: Fair     Standing balance support: Bilateral upper extremity supported, Reliant on assistive device for balance Standing balance-Leahy Scale: Poor Standing balance comment: minA when releasing walker to hug visitor                             Pertinent Vitals/Pain Pain Assessment Pain Assessment: No/denies pain    Home Living Family/patient expects to be discharged to:: Skilled nursing facility (  son feels pt may need long term placement) Living Arrangements: Alone Available Help at  Discharge: Family;Available PRN/intermittently Type of Home: House Home Access: Stairs to enter Entrance Stairs-Rails: None Entrance Stairs-Number of Steps: 2 Alternate Level Stairs-Number of Steps: 2 steps to bedroom Home Layout: Multi-level Home Equipment: Conservation officer, nature (2 wheels);Wheelchair - manual      Prior Function Prior Level of Function : History of Falls (last six months)             Mobility Comments: pt with many falls within the last year and with multiple injuries per son. Typically ambulates with RW and has had falls with use of walker       Hand Dominance   Dominant Hand: Right    Extremity/Trunk Assessment   Upper Extremity Assessment Upper Extremity Assessment: Generalized weakness (essential tremor BUE, bilateral shoulder OA limiting active flexion to ~100 degrees)    Lower Extremity Assessment Lower Extremity Assessment: Generalized weakness    Cervical / Trunk Assessment Cervical / Trunk Assessment: Kyphotic  Communication   Communication: HOH (hears better from left ear)  Cognition Arousal/Alertness: Awake/alert Behavior During Therapy: WFL for tasks assessed/performed Overall Cognitive Status: Impaired/Different from baseline Area of Impairment: Safety/judgement, Awareness, Problem solving                         Safety/Judgement: Decreased awareness of safety, Decreased awareness of deficits Awareness: Emergent Problem Solving: Slow processing, Requires verbal cues          General Comments General comments (skin integrity, edema, etc.): VSS on RA    Exercises     Assessment/Plan    PT Assessment Patient needs continued PT services  PT Problem List Decreased strength;Decreased activity tolerance;Decreased balance;Decreased mobility;Decreased cognition;Decreased knowledge of use of DME;Decreased safety awareness;Decreased knowledge of precautions       PT Treatment Interventions DME instruction;Gait training;Functional  mobility training;Therapeutic activities;Therapeutic exercise;Stair training;Balance training;Neuromuscular re-education;Cognitive remediation;Patient/family education    PT Goals (Current goals can be found in the Care Plan section)  Acute Rehab PT Goals Patient Stated Goal: to reduce falls risk PT Goal Formulation: With patient/family Time For Goal Achievement: 12/12/22 Potential to Achieve Goals: Fair    Frequency Min 3X/week     Co-evaluation               AM-PAC PT "6 Clicks" Mobility  Outcome Measure Help needed turning from your back to your side while in a flat bed without using bedrails?: A Little Help needed moving from lying on your back to sitting on the side of a flat bed without using bedrails?: A Little Help needed moving to and from a bed to a chair (including a wheelchair)?: A Little Help needed standing up from a chair using your arms (e.g., wheelchair or bedside chair)?: A Little Help needed to walk in hospital room?: A Lot Help needed climbing 3-5 steps with a railing? : Total 6 Click Score: 15    End of Session   Activity Tolerance: Patient tolerated treatment well Patient left: in bed;with call bell/phone within reach;with family/visitor present (alarm left off, transport present to take to MRI) Nurse Communication: Mobility status PT Visit Diagnosis: Other abnormalities of gait and mobility (R26.89);Muscle weakness (generalized) (M62.81);History of falling (Z91.81)    Time: [id number] PT Time Calculation (min) (ACUTE ONLY): 34 min   Charges:   PT Evaluation $PT Eval Low Complexity: 1 Low          Zenaida Niece, PT, DPT  Acute Rehabilitation Office [phone]   Zenaida Niece 11/28/2022, 4:46 PM

## 2022-11-28 NOTE — ED Notes (Signed)
Pt confused and pulling at her purewick  Pt's purewick replaced and pads under pt changed  Pt repositioned  Family remains at bedside

## 2022-11-28 NOTE — ED Notes (Signed)
Pt asleep  Family states they are leaving to get some rest  Door opened for visualization of pt

## 2022-11-28 NOTE — ED Notes (Signed)
ED TO INPATIENT HANDOFF REPORT  ED Nurse Name and Phone #: Angelica Chessman Name/Age/Gender Kiara Blair 89 y.o. female Room/Bed: MH11/MH11  Code Status   Code Status: Prior  Home/SNF/Other Unknown discharge- Pt lives with family currently. Patient oriented to: self and place Is this baseline? Yes   Triage Complete: Triage complete  Chief Complaint Stroke-like symptoms [R29.90]  Triage Note Brought in with sons who state she was noted to be confused at 1215 this afternoon. States she was saying she was at a hotel with other people however she was at home alone.   Per son, was fine at 2100 last night and was normal, noted to be slightly confused with some slurring of speech at 2200 on phone with son again last night. LKW 2100 yesterday. Oriented to place and situation, however is disoriented to time.  Son states had some diarrhea last night. Hx TIA.   Allergies Allergies  Allergen Reactions   Doxycycline Hyclate Nausea And Vomiting and Other (See Comments)   Levofloxacin Nausea And Vomiting   Meloxicam Nausea And Vomiting   Naproxen-Esomeprazole Mg Nausea And Vomiting   Nitrofurantoin Nausea And Vomiting   Other     Pt states she is allergic to meds but does not know names   Pneumococcal Polysaccharide Vaccine Itching    Level of Care/Admitting Diagnosis ED Disposition     ED Disposition  Admit   Condition  --   Comment  Hospital Area: [facility] [[id number]]  Level of Care: Telemetry Medical [id number]  Interfacility transfer: Yes  May place patient in observation at [facility] or [facility] if equivalent level of care is available:: Yes  Covid Evaluation: Asymptomatic - no recent exposure (last 10 days) testing not required  Diagnosis: Stroke-like symptoms [id number]  Admitting Physician: Dory Horn [id number]  Attending Physician: Regan Lemming [phone]          B Medical/Surgery History Past Medical History:  Diagnosis Date    Diabetes mellitus without complication ([place])    Hypercholesteremia    Hypertension    Tremor    right arm   Past Surgical History:  Procedure Laterality Date   ABDOMINAL HYSTERECTOMY     APPENDECTOMY     BACK SURGERY     BLADDER SUSPENSION     CHOLECYSTECTOMY     EYE SURGERY     TONSILLECTOMY       A IV Location/Drains/Wounds Patient Lines/Drains/Airways Status     Active Line/Drains/Airways     Name Placement date Placement time Site Days   Peripheral IV 11/27/22 20 G 1" Anterior;Right Forearm 11/27/22  1817  Forearm  1   External Urinary Catheter 11/27/22  1806  --  1            Intake/Output Last 24 hours  Intake/Output Summary (Last 24 hours) at 11/28/2022 1106 Last data filed at 11/28/2022 0700 Gross per 24 hour  Intake --  Output 1800 ml  Net -1800 ml    Labs/Imaging Results for orders placed or performed during the hospital encounter of 11/27/22 (from the past 48 hour(s))  CBG monitoring, ED     Status: Abnormal   Collection Time: 11/27/22  4:35 PM  Result Value Ref Range   Glucose-Capillary 196 (H) 70 - 99 mg/dL    Comment: Glucose reference range applies only to samples taken after fasting for at least 8 hours.  Comprehensive metabolic panel     Status: Abnormal   Collection Time: 11/27/22  5:56 PM  Result Value Ref Range   Sodium 130 (L) 135 - 145 mmol/L   Potassium 4.0 3.5 - 5.1 mmol/L   Chloride 98 98 - 111 mmol/L   CO2 24 22 - 32 mmol/L   Glucose, Bld 163 (H) 70 - 99 mg/dL    Comment: Glucose reference range applies only to samples taken after fasting for at least 8 hours.   BUN 30 (H) 8 - 23 mg/dL   Creatinine, Ser 0.90 0.44 - 1.00 mg/dL   Calcium 8.8 (L) 8.9 - 10.3 mg/dL   Total Protein 6.1 (L) 6.5 - 8.1 g/dL   Albumin 3.5 3.5 - 5.0 g/dL   AST 23 15 - 41 U/L   ALT 20 0 - 44 U/L   Alkaline Phosphatase 94 38 - 126 U/L   Total Bilirubin 0.6 0.3 - 1.2 mg/dL   GFR, Estimated >60 >60 mL/min    Comment: (NOTE) Calculated using the CKD-EPI  Creatinine Equation (2021)    Anion gap 8 5 - 15    Comment: Performed at [facility], [address] [place], [place] [address]  CBC with Differential     Status: Abnormal   Collection Time: 11/27/22  5:56 PM  Result Value Ref Range   WBC 7.2 4.0 - 10.5 K/uL   RBC 3.78 (L) 3.87 - 5.11 MIL/uL   Hemoglobin 11.2 (L) 12.0 - 15.0 g/dL   HCT 33.3 (L) 36.0 - 46.0 %   MCV 88.1 80.0 - 100.0 fL   MCH 29.6 26.0 - 34.0 pg   MCHC 33.6 30.0 - 36.0 g/dL   RDW 14.4 11.5 - 15.5 %   Platelets 232 150 - 400 K/uL   nRBC 0.0 0.0 - 0.2 %   Neutrophils Relative % 64 %   Neutro Abs 4.5 1.7 - 7.7 K/uL   Lymphocytes Relative 26 %   Lymphs Abs 1.9 0.7 - 4.0 K/uL   Monocytes Relative 9 %   Monocytes Absolute 0.7 0.1 - 1.0 K/uL   Eosinophils Relative 1 %   Eosinophils Absolute 0.1 0.0 - 0.5 K/uL   Basophils Relative 0 %   Basophils Absolute 0.0 0.0 - 0.1 K/uL   Immature Granulocytes 0 %   Abs Immature Granulocytes 0.03 0.00 - 0.07 K/uL    Comment: Performed at [facility], [address] [place], [place] [address]  Urinalysis, w/ Reflex to Culture (Infection Suspected) -Urine, Clean Catch     Status: Abnormal   Collection Time: 11/27/22  6:13 PM  Result Value Ref Range   Specimen Source URINE, CLEAN CATCH    Color, Urine YELLOW YELLOW   APPearance CLEAR CLEAR   Specific Gravity, Urine 1.015 1.005 - 1.030   pH 5.5 5.0 - 8.0   Glucose, UA NEGATIVE NEGATIVE mg/dL   Hgb urine dipstick NEGATIVE NEGATIVE   Bilirubin Urine NEGATIVE NEGATIVE   Ketones, ur NEGATIVE NEGATIVE mg/dL   Protein, ur NEGATIVE NEGATIVE mg/dL   Nitrite NEGATIVE NEGATIVE   Leukocytes,Ua NEGATIVE NEGATIVE   Squamous Epithelial / HPF 0-5 0 - 5 /HPF   Non Squamous Epithelial PRESENT (A) NONE SEEN   WBC, UA 6-10 0 - 5 WBC/hpf    Comment: Reflex urine culture not performed if WBC <=10, OR if Squamous epithelial cells >5. If Squamous epithelial cells >5, suggest recollection.   RBC / HPF 0-5 0 - 5 RBC/hpf    Bacteria, UA RARE (A) NONE SEEN   WBC Clumps PRESENT     Comment:  Performed at [facility], [address] [place], [place] [address]  Troponin I (High Sensitivity)     Status: Abnormal   Collection Time: 11/27/22  6:13 PM  Result Value Ref Range   Troponin I (High Sensitivity) 36 (H) <18 ng/L    Comment: (NOTE) Elevated high sensitivity troponin I (hsTnI) values and significant  changes across serial measurements may suggest ACS but many other  chronic and acute conditions are known to elevate hsTnI results.  Refer to the "Links" section for chest pain algorithms and additional  guidance. Performed at [facility], [address] [place], [place] [address]   Resp panel by RT-PCR (RSV, Flu A&B, Covid) Anterior Nasal Swab     Status: None   Collection Time: 11/27/22  6:20 PM   Specimen: Anterior Nasal Swab  Result Value Ref Range   SARS Coronavirus 2 by RT PCR NEGATIVE NEGATIVE    Comment: (NOTE) SARS-CoV-2 target nucleic acids are NOT DETECTED.  The SARS-CoV-2 RNA is generally detectable in upper respiratory specimens during the acute phase of infection. The lowest concentration of SARS-CoV-2 viral copies this assay can detect is 138 copies/mL. A negative result does not preclude SARS-Cov-2 infection and should not be used as the sole basis for treatment or other patient management decisions. A negative result may occur with  improper specimen collection/handling, submission of specimen other than nasopharyngeal swab, presence of viral mutation(s) within the areas targeted by this assay, and inadequate number of viral copies(<138 copies/mL). A negative result must be combined with clinical observations, patient history, and epidemiological information. The expected result is Negative.  Fact Sheet for Patients:  EntrepreneurPulse.com.au  Fact Sheet for Healthcare Providers:  IncredibleEmployment.be  This test is no  t yet approved or cleared by the [place] FDA and  has been authorized for detection and/or diagnosis of SARS-CoV-2 by FDA under an Emergency Use Authorization (EUA). This EUA will remain  in effect (meaning this test can be used) for the duration of the COVID-19 declaration under Section 564(b)(1) of the Act, 21 U.S.C.section 360bbb-3(b)(1), unless the authorization is terminated  or revoked sooner.       Influenza A by PCR NEGATIVE NEGATIVE   Influenza B by PCR NEGATIVE NEGATIVE    Comment: (NOTE) The Xpert Xpress SARS-CoV-2/FLU/RSV plus assay is intended as an aid in the diagnosis of influenza from Nasopharyngeal swab specimens and should not be used as a sole basis for treatment. Nasal washings and aspirates are unacceptable for Xpert Xpress SARS-CoV-2/FLU/RSV testing.  Fact Sheet for Patients: EntrepreneurPulse.com.au  Fact Sheet for Healthcare Providers: IncredibleEmployment.be  This test is not yet approved or cleared by the [place] FDA and has been authorized for detection and/or diagnosis of SARS-CoV-2 by FDA under an Emergency Use Authorization (EUA). This EUA will remain in effect (meaning this test can be used) for the duration of the COVID-19 declaration under Section 564(b)(1) of the Act, 21 U.S.C. section 360bbb-3(b)(1), unless the authorization is terminated or revoked.     Resp Syncytial Virus by PCR NEGATIVE NEGATIVE    Comment: (NOTE) Fact Sheet for Patients: EntrepreneurPulse.com.au  Fact Sheet for Healthcare Providers: IncredibleEmployment.be  This test is not yet approved or cleared by the [place] FDA and has been authorized for detection and/or diagnosis of SARS-CoV-2 by FDA under an Emergency Use Authorization (EUA). This EUA will remain in effect (meaning this test can be used) for the duration of the COVID-19 declaration under Section 564(b)(1) of  the Act, 21  U.S.C. section 360bbb-3(b)(1), unless the authorization is terminated or revoked.  Performed at [facility], [address] [place], [place] [address]   Troponin I (High Sensitivity)     Status: Abnormal   Collection Time: 11/27/22  8:19 PM  Result Value Ref Range   Troponin I (High Sensitivity) 33 (H) <18 ng/L    Comment: (NOTE) Elevated high sensitivity troponin I (hsTnI) values and significant  changes across serial measurements may suggest ACS but many other  chronic and acute conditions are known to elevate hsTnI results.  Refer to the "Links" section for chest pain algorithms and additional  guidance. Performed at [facility], [address] [place], [place] [address]    CT Head Wo Contrast  Result Date: 11/27/2022 CLINICAL DATA:  Persistent altered mental status EXAM: CT HEAD WITHOUT CONTRAST TECHNIQUE: Contiguous axial images were obtained from the base of the skull through the vertex without intravenous contrast. RADIATION DOSE REDUCTION: This exam was performed according to the departmental dose-optimization program which includes automated exposure control, adjustment of the mA and/or kV according to patient size and/or use of iterative reconstruction technique. COMPARISON:  CT from earlier in the same day. FINDINGS: Brain: No evidence of acute infarction, hemorrhage, hydrocephalus, extra-axial collection or mass lesion/mass effect. Chronic atrophic and white matter ischemic changes are noted. No acute hemorrhage or acute infarction is seen. Vascular: No hyperdense vessel or unexpected calcification. Skull: Normal. Negative for fracture or focal lesion. Sinuses/Orbits: No acute finding. Other: None. IMPRESSION: Chronic atrophic and ischemic changes. No acute abnormality is noted. No significant change from the prior exam is seen. Electronically Signed   By: Inez Catalina M.D.   On: 11/27/2022 23:56   DG Chest Port 1 View  Result Date: 11/27/2022 CLINICAL  DATA:  Altered mental status, confusion EXAM: PORTABLE CHEST 1 VIEW COMPARISON:  04/12/2022 FINDINGS: Atherosclerotic calcification of the aortic arch. Thoracic spondylosis noted. Heart size within normal limits. The lungs appear clear. Substantial degenerative glenohumeral arthropathy, left greater than right. No blunting of the costophrenic angles. IMPRESSION: 1. No active cardiopulmonary disease is radiographically apparent. 2. Thoracic spondylosis. 3. Substantial degenerative glenohumeral arthropathy, left greater than right. Electronically Signed   By: Van Clines M.D.   On: 11/27/2022 18:57   CT Head Wo Contrast  Result Date: 11/27/2022 CLINICAL DATA:  Mental status change of unknown cause.  Confusion. EXAM: CT HEAD WITHOUT CONTRAST TECHNIQUE: Contiguous axial images were obtained from the base of the skull through the vertex without intravenous contrast. RADIATION DOSE REDUCTION: This exam was performed according to the departmental dose-optimization program which includes automated exposure control, adjustment of the mA and/or kV according to patient size and/or use of iterative reconstruction technique. COMPARISON:  11/07/2022 FINDINGS: Brain: No change. No acute finding. Age related volume loss and mild chronic small-vessel change of the white matter. No large vessel infarction. No mass, hemorrhage, hydrocephalus or extra-axial collection. Vascular: There is atherosclerotic calcification of the major vessels at the base of the brain. Skull: Normal Sinuses/Orbits: Clear/normal Other: None IMPRESSION: No acute or reversible finding. Age related volume loss and mild chronic small-vessel change of the white matter. Electronically Signed   By: Nelson Chimes M.D.   On: 11/27/2022 18:56    Pending Labs Unresulted Labs (From admission, onward)    None       Vitals/Pain Today's Vitals   11/28/22 0916 11/28/22 0930 11/28/22 0945 11/28/22 1105  BP:  (!) 148/52  Pulse:  61 61   Resp:  18 20    Temp:      TempSrc:      SpO2:  98% 98%   Weight:      Height:      PainSc: Asleep Asleep  Asleep    Isolation Precautions No active isolations  Medications Medications  benazepril (LOTENSIN) tablet 20 mg (20 mg Oral Not Given 11/28/22 0735)  propranolol (INDERAL) tablet 10 mg (10 mg Oral Not Given 11/28/22 0036)  sodium chloride 0.9 % bolus 1,000 mL (1,000 mLs Intravenous Bolus 11/27/22 2136)  amLODipine (NORVASC) tablet 5 mg (5 mg Oral Given 11/28/22 0026)  metFORMIN (GLUCOPHAGE) tablet 500 mg (500 mg Oral Given 11/28/22 0026)  LORazepam (ATIVAN) tablet 1 mg (1 mg Oral Given 11/28/22 0238)    Mobility non-ambulatory     Focused Assessments Neuro Assessment Handoff:  Swallow screen pass? Yes  Cardiac Rhythm: Sinus bradycardia NIH Stroke Scale  Dizziness Present: No Headache Present: No Interval: Shift assessment Level of Consciousness (1a.)   : Alert, keenly responsive LOC Questions (1b. )   : Answers both questions correctly LOC Commands (1c. )   : Performs both tasks correctly Best Gaze (2. )  : Normal Visual (3. )  : No visual loss Facial Palsy (4. )    : Normal symmetrical movements Motor Arm, Left (5a. )   : No drift Motor Arm, Right (5b. ) : No drift Motor Leg, Left (6a. )  : No drift Motor Leg, Right (6b. ) : No drift Limb Ataxia (7. ): Absent Sensory (8. )  : Normal, no sensory loss Best Language (9. )  : No aphasia Dysarthria (10. ): Normal Extinction/Inattention (11.)   : No Abnormality Complete NIHSS TOTAL: 0 Last date known well: 11/26/22 Last time known well: 2100 Neuro Assessment: Within Defined Limits Neuro Checks:   Neuro change (11/27/22 2245)  Has TPA been given? No If patient is a Neuro Trauma and patient is going to OR before floor call report to [place] nurse: [phone] or [phone]   R Recommendations: See Admitting Provider Note  Report given to:   Additional Notes: Pt reported to ED with family 02/19 with increased  confusion, altered mental status. Pt oriented to self and place. Pt has been resting with eyes closed this entire shift thus far. Pt is on cardiac monitoring with HR in NSR, room air. Vitals are stable. External catheter in place. Bed in lowest position.

## 2022-11-28 NOTE — ED Notes (Signed)
Pt sleeping with eyes closed. Bed in lowest position.

## 2022-11-28 NOTE — Progress Notes (Signed)
Attempted MRI with patient medicated. Pt began screaming and panicking when we tried to put her in scanner. She was very confused and unable to hold still.

## 2022-11-28 NOTE — Progress Notes (Signed)
Pt arrived to 3W15 via [facility] at this time.  Drowsy and  alert to self and place; disoriented to time and situation.  Telemetry placed on patient, CCMD called and second verification completed.  Bed alarm set and patient verbalizes understanding to call before attempting to get out of bed. Call bell within reach.

## 2022-11-28 NOTE — ED Notes (Signed)
Called [facility] for transfer to [facility].   Spoke with Jacobo Forest @11$ :12am

## 2022-11-28 NOTE — Plan of Care (Signed)
  Problem: Clinical Measurements: Goal: Will remain free from infection Outcome: Progressing   Problem: Coping: Goal: Level of anxiety will decrease Outcome: Progressing   Problem: Elimination: Goal: Will not experience complications related to bowel motility Outcome: Progressing Goal: Will not experience complications related to urinary retention Outcome: Progressing   Problem: Pain Managment: Goal: General experience of comfort will improve Outcome: Progressing   Problem: Safety: Goal: Ability to remain free from injury will improve Outcome: Progressing   Problem: Skin Integrity: Goal: Risk for impaired skin integrity will decrease Outcome: Progressing

## 2022-11-28 NOTE — H&P (Addendum)
History and Physical   Kiara Blair [id number] DOB: 07-28-1930 DOA: 11/27/2022  PCP: Rich Fuchs, PA   Patient coming from: Home  Chief Complaint: Weakness, altered mental status  HPI: Kiara Blair is a 89 y.o. female with medical history significant of stroke, diabetes, hypertension, hyperlipidemia, neuropathy, tremor, GERD, anxiety, cataract, hearing loss, paresthesia presenting with altered status and weakness.  History obtained with assistance of chart review and family.  Patient spoke to one of her sons around 8 PM on the 18th and was slurring her words but has a history of this due to left-sided paresthesias which she has had since a child.  On the 19th however he spoke to her on the phone and she was telling stories that did not make any sense which is unusual for her including stories about living in a hotel and traveling across the country.  Family ports this is how she acts previously when she had a UTI.  Has had some weakness but is able to ambulate with a walker which is her baseline.  In the ED had additional episode of right-sided weakness and difficulty eating per chart review.  Difficult to obtain full review of systems due to patient's altered mentation but has no specific complaints at this time.  ED Course: Vital signs in ED significant for blood pressure in the Q000111Q to 99991111 systolic, heart rate in the 50s to 60s.  Lab workup included CMP with sodium 130, BUN 30, glucose 163, calcium 8.8, protein 6.1.  CBC with hemoglobin of 11.2 which is below baseline of 12-13.  Troponin mildly elevated but downtrending at 36 on first check and 33 on repeat.  Respiratory panel for flu COVID RSV negative.  Urinalysis with rare bacteria only.  Chest x-ray showed no acute abnormality.  CT head showed no acute abnormality and repeat CT head after episode described again continue to show no acute abnormality.  MRI brain is pending.  Patient received amlodipine, benazepril, propranolol in the ED.   Also received dose of Ativan, metformin and a liter of IV fluids.  Neurology consulted and recommended admission for encephalopathy workup as well as MRI to rule out stroke.  Review of Systems: The obtain full review of systems due to patient's altered mentation and baseline memory issues, but no specific complaints at this time.  Past Medical History:  Diagnosis Date   Diabetes mellitus without complication ([place])    Hypercholesteremia    Hypertension    TIA (transient ischemic attack) 01/03/2021   Tremor    right arm    Past Surgical History:  Procedure Laterality Date   ABDOMINAL HYSTERECTOMY     APPENDECTOMY     BACK SURGERY     BLADDER SUSPENSION     CHOLECYSTECTOMY     EYE SURGERY     TONSILLECTOMY      Social History  reports that she has never smoked. She has never used smokeless tobacco. She reports that she does not drink alcohol and does not use drugs.  Allergies  Allergen Reactions   Doxycycline Hyclate Nausea And Vomiting and Other (See Comments)   Levofloxacin Nausea And Vomiting   Meloxicam Nausea And Vomiting   Naproxen-Esomeprazole Mg Nausea And Vomiting   Nitrofurantoin Nausea And Vomiting   Other     Pt states she is allergic to meds but does not know names   Pneumococcal Polysaccharide Vaccine Itching    Family History  Problem Relation Age of Onset   CAD Father   Reviewed on  admission  Prior to Admission medications   Medication Sig Start Date End Date Taking? Authorizing Provider  acetaminophen (TYLENOL) 325 MG tablet Take 2 tablets (650 mg total) by mouth every 6 (six) hours as needed. 12/06/21   Jeanell Sparrow, DO  amLODipine-benazepril (LOTREL) 5-20 MG per capsule Take 1 capsule by mouth daily.    [provider]  aspirin EC 81 MG tablet Take 1 tablet (81 mg total) by mouth daily. Swallow whole. 01/05/21   Domenic Polite, MD  atorvastatin (LIPITOR) 40 MG tablet Take 1 tablet (40 mg total) by mouth daily. 02/08/21   Frann Rider, NP   KAZANO 12.5-500 MG TABS Take 1 tablet by mouth 2 (two) times daily. 04/21/20   [provider]  lidocaine (XYLOCAINE) 5 % ointment Apply 1 Application topically 4 (four) times daily as needed for moderate pain. 07/31/22   Long, Wonda Olds, MD  Multiple Vitamin (MULTIVITAMIN WITH MINERALS) TABS tablet Take 1 tablet by mouth daily. Dr. Lenon Oms Vitamins.    [provider]  propranolol (INDERAL) 10 MG tablet Take 0.5 tablets (5 mg total) by mouth 2 (two) times daily. 01/05/21   Domenic Polite, MD    Physical Exam: Vitals:   11/28/22 1115 11/28/22 1130 11/28/22 1131 11/28/22 1145  BP:  (!) 180/58    Pulse: 63     Resp: (!) 21   18  Temp:   98.2 F (36.8 C)   TempSrc:   Oral   SpO2: 99%  98%   Weight:      Height:        Physical Exam Constitutional:      General: She is not in acute distress.    Appearance: Normal appearance.  HENT:     Head: Normocephalic and atraumatic.     Mouth/Throat:     Mouth: Mucous membranes are moist.     Pharynx: Oropharynx is clear.  Eyes:     Extraocular Movements: Extraocular movements intact.     Pupils: Pupils are equal, round, and reactive to light.  Cardiovascular:     Rate and Rhythm: Normal rate and regular rhythm.     Pulses: Normal pulses.     Heart sounds: Normal heart sounds.  Pulmonary:     Effort: Pulmonary effort is normal. No respiratory distress.     Breath sounds: Normal breath sounds.  Abdominal:     General: Bowel sounds are normal. There is no distension.     Palpations: Abdomen is soft.     Tenderness: There is no abdominal tenderness.  Musculoskeletal:        General: No swelling or deformity.  Skin:    General: Skin is warm and dry.  Neurological:     Comments: Mental Status: Patient is awake, alert, oriented to person and place, but not year No signs of aphasia or neglect Cranial Nerves: II: Pupils equal, round, and reactive to light.   III,IV, VI: EOMI without ptosis or diploplia.  V: Facial  sensation is symmetric to light touch. VII: Facial movement is symmetric.  VIII: hearing is intact to voice X: Uvula elevates symmetrically XI: Shoulder shrug is symmetric. XII: tongue is midline without atrophy or fasciculations.  Motor: Good effort thorughout, at Least 4-5/5 bilateral UE, 4-5/5 bilateral lower extremitiy  Sensory: Sensation is grossly intact bilateral UEs & LEs Cerebellar: Finger-Nose intact bilalat, but slow and with her baseline tremor    Labs on Admission: I have personally reviewed following labs and imaging studies  CBC: Recent Labs  Lab 11/27/22 1756  WBC 7.2  NEUTROABS 4.5  HGB 11.2*  HCT 33.3*  MCV 88.1  PLT [id number]    Basic Metabolic Panel: Recent Labs  Lab 11/27/22 1756  NA 130*  K 4.0  CL 98  CO2 24  GLUCOSE 163*  BUN 30*  CREATININE 0.90  CALCIUM 8.8*    GFR: Estimated Creatinine Clearance: 30.4 mL/min (by C-G formula based on SCr of 0.9 mg/dL).  Liver Function Tests: Recent Labs  Lab 11/27/22 1756  AST 23  ALT 20  ALKPHOS 94  BILITOT 0.6  PROT 6.1*  ALBUMIN 3.5    Urine analysis:    Component Value Date/Time   COLORURINE YELLOW 11/27/2022 1813   APPEARANCEUR CLEAR 11/27/2022 1813   LABSPEC 1.015 11/27/2022 1813   PHURINE 5.5 11/27/2022 1813   GLUCOSEU NEGATIVE 11/27/2022 1813   HGBUR NEGATIVE 11/27/2022 1813   BILIRUBINUR NEGATIVE 11/27/2022 1813   KETONESUR NEGATIVE 11/27/2022 1813   PROTEINUR NEGATIVE 11/27/2022 1813   NITRITE NEGATIVE 11/27/2022 1813   LEUKOCYTESUR NEGATIVE 11/27/2022 1813    Radiological Exams on Admission: CT Head Wo Contrast  Result Date: 11/27/2022 CLINICAL DATA:  Persistent altered mental status EXAM: CT HEAD WITHOUT CONTRAST TECHNIQUE: Contiguous axial images were obtained from the base of the skull through the vertex without intravenous contrast. RADIATION DOSE REDUCTION: This exam was performed according to the departmental dose-optimization program which includes automated exposure  control, adjustment of the mA and/or kV according to patient size and/or use of iterative reconstruction technique. COMPARISON:  CT from earlier in the same day. FINDINGS: Brain: No evidence of acute infarction, hemorrhage, hydrocephalus, extra-axial collection or mass lesion/mass effect. Chronic atrophic and white matter ischemic changes are noted. No acute hemorrhage or acute infarction is seen. Vascular: No hyperdense vessel or unexpected calcification. Skull: Normal. Negative for fracture or focal lesion. Sinuses/Orbits: No acute finding. Other: None. IMPRESSION: Chronic atrophic and ischemic changes. No acute abnormality is noted. No significant change from the prior exam is seen. Electronically Signed   By: Inez Catalina M.D.   On: 11/27/2022 23:56   DG Chest Port 1 View  Result Date: 11/27/2022 CLINICAL DATA:  Altered mental status, confusion EXAM: PORTABLE CHEST 1 VIEW COMPARISON:  04/12/2022 FINDINGS: Atherosclerotic calcification of the aortic arch. Thoracic spondylosis noted. Heart size within normal limits. The lungs appear clear. Substantial degenerative glenohumeral arthropathy, left greater than right. No blunting of the costophrenic angles. IMPRESSION: 1. No active cardiopulmonary disease is radiographically apparent. 2. Thoracic spondylosis. 3. Substantial degenerative glenohumeral arthropathy, left greater than right. Electronically Signed   By: Van Clines M.D.   On: 11/27/2022 18:57   CT Head Wo Contrast  Result Date: 11/27/2022 CLINICAL DATA:  Mental status change of unknown cause.  Confusion. EXAM: CT HEAD WITHOUT CONTRAST TECHNIQUE: Contiguous axial images were obtained from the base of the skull through the vertex without intravenous contrast. RADIATION DOSE REDUCTION: This exam was performed according to the departmental dose-optimization program which includes automated exposure control, adjustment of the mA and/or kV according to patient size and/or use of iterative  reconstruction technique. COMPARISON:  11/07/2022 FINDINGS: Brain: No change. No acute finding. Age related volume loss and mild chronic small-vessel change of the white matter. No large vessel infarction. No mass, hemorrhage, hydrocephalus or extra-axial collection. Vascular: There is atherosclerotic calcification of the major vessels at the base of the brain. Skull: Normal Sinuses/Orbits: Clear/normal Other: None IMPRESSION: No acute or reversible finding. Age related volume loss and mild chronic small-vessel change of  the white matter. Electronically Signed   By: Nelson Chimes M.D.   On: 11/27/2022 18:56    EKG: Independently reviewed.  Sinus rhythm at 69 bpm.  Baseline wander.  Minimal baseline artifact.  Low voltage multiple leads.  Some J-point elevation V2.  Assessment/Plan Principal Problem:   Stroke-like symptoms Active Problems:   Essential hypertension   Diabetes mellitus type 2 in nonobese (HCC)   History of CVA (cerebrovascular accident)   Diabetic polyneuropathy associated with type 2 diabetes mellitus (HCC)   Gastroesophageal reflux disease   Generalized anxiety disorder   Hyperlipidemia LDL goal <100   Strokelike symptoms Encephalopathy Rule out stroke History of prior stroke > Patient presenting with some encephalopathy including telling stories that did not make sense to family which is typical for UTI in the past per family. > No evidence of UTI or other infectious etiology in the ED with negative panel for flu COVID RSV, no leukocytosis, urinalysis with rare bacteria only.  Chest x-ray clear.  Does have mild hyponatremia as below. > Did have episode of unilateral weakness and difficulty eating in the ED raising concern for possible TIA/stroke.  Patient does have history of prior TIA and CVA. > EDP spoke to neurology who recommended MRI to rule out stroke and admission to the hospitalist for encephalopathy workup. > Passed initial swallow study in ED, however this was before  episode of facial droop and right-sided weakness which has improved.  She has failed repeat swallow study subsequently. - Continue to monitor on telemetry - Repeat CMP to trend electrolytes - Check magnesium - Check TSH - Follow-up MR brain - Hold off on further antihypertensives while awaiting MRI results - Continue home aspirin and atorvastatin - SLP eval and treat - PT/OT eval and treat, will need this either way given her age and generalized weakness ADDENDUM > Patient unable to tolerate MRI, due to anxiety, despite ativan. May need timed repeat CT instead of MRI. > Will further work up possible TIA for now  - Will benefit from Neurology consult in the morning - Continue with permissive hypertension - Echocardiogram - A.m. lipid panel and A1c - Hold off on vascular imaging pending neuro input  Diabetes - SSI  Hypertension - Holding home antihypertensives as above  Hyperlipidemia - Continue home atorvastatin  Neuropathy Hearing loss History of paresthesias - Noted  DVT prophylaxis: Lovenox Code Status:   Full, discussed with son at bedside.  States that based on their understanding they she would want a trial of everything but would not want to be on life support for extended period. Family Communication:  Son updated at bedside. Disposition Plan:   Patient is from:  Home  Anticipated DC to:  Home  Anticipated DC date:  1 to 2 days  Anticipated DC barriers: None  Consults called:  Neurology, consulted in the ED, not following, will reconsult as needed pending MRI. Admission status:  Observation, telemetry  Severity of Illness: The appropriate patient status for this patient is OBSERVATION. Observation status is judged to be reasonable and necessary in order to provide the required intensity of service to ensure the patient's safety. The patient's presenting symptoms, physical exam findings, and initial radiographic and laboratory data in the context of their medical  condition is felt to place them at decreased risk for further clinical deterioration. Furthermore, it is anticipated that the patient will be medically stable for discharge from the hospital within 2 midnights of admission.    Marcelyn Bruins MD [facility]  How to contact the [facility] Attending or Consulting provider [place] or covering provider during after hours [place], for this patient?   Check the care team in [facility] and look for a) attending/consulting TR[facility] provider listed and b) the [facility] team listed Log into [email or web address] and use [place]'s universal password to access. If you do not have the password, please contact the hospital operator. Locate the [facility] provider you are looking for under [facility] and page to a number that you can be directly reached. If you still have difficulty reaching the provider, please page the [facility] (Director on Call) for the Hospitalists listed on amion for assistance.  11/28/2022, 1:23 PM

## 2022-11-28 NOTE — ED Notes (Signed)
Spoke with pt sons Lanny Hurst and Delfino Lovett. They are aware of transfer and gave verbal consent to transfer pt.

## 2022-11-29 ENCOUNTER — Observation Stay (HOSPITAL_COMMUNITY): Payer: Medicare Other

## 2022-11-29 DIAGNOSIS — F40240 Claustrophobia: Secondary | ICD-10-CM | POA: Diagnosis present

## 2022-11-29 DIAGNOSIS — N390 Urinary tract infection, site not specified: Secondary | ICD-10-CM | POA: Diagnosis present

## 2022-11-29 DIAGNOSIS — Z8249 Family history of ischemic heart disease and other diseases of the circulatory system: Secondary | ICD-10-CM | POA: Diagnosis not present

## 2022-11-29 DIAGNOSIS — R4182 Altered mental status, unspecified: Secondary | ICD-10-CM | POA: Diagnosis not present

## 2022-11-29 DIAGNOSIS — Z87440 Personal history of urinary (tract) infections: Secondary | ICD-10-CM | POA: Diagnosis not present

## 2022-11-29 DIAGNOSIS — R29810 Facial weakness: Secondary | ICD-10-CM | POA: Diagnosis present

## 2022-11-29 DIAGNOSIS — G459 Transient cerebral ischemic attack, unspecified: Secondary | ICD-10-CM

## 2022-11-29 DIAGNOSIS — R64 Cachexia: Secondary | ICD-10-CM | POA: Diagnosis present

## 2022-11-29 DIAGNOSIS — G250 Essential tremor: Secondary | ICD-10-CM | POA: Diagnosis present

## 2022-11-29 DIAGNOSIS — I10 Essential (primary) hypertension: Secondary | ICD-10-CM | POA: Diagnosis present

## 2022-11-29 DIAGNOSIS — K219 Gastro-esophageal reflux disease without esophagitis: Secondary | ICD-10-CM | POA: Diagnosis present

## 2022-11-29 DIAGNOSIS — Z90710 Acquired absence of both cervix and uterus: Secondary | ICD-10-CM | POA: Diagnosis not present

## 2022-11-29 DIAGNOSIS — Z79899 Other long term (current) drug therapy: Secondary | ICD-10-CM | POA: Diagnosis not present

## 2022-11-29 DIAGNOSIS — I69354 Hemiplegia and hemiparesis following cerebral infarction affecting left non-dominant side: Secondary | ICD-10-CM | POA: Diagnosis not present

## 2022-11-29 DIAGNOSIS — E1165 Type 2 diabetes mellitus with hyperglycemia: Secondary | ICD-10-CM | POA: Diagnosis present

## 2022-11-29 DIAGNOSIS — G9341 Metabolic encephalopathy: Secondary | ICD-10-CM | POA: Diagnosis not present

## 2022-11-29 DIAGNOSIS — R471 Dysarthria and anarthria: Secondary | ICD-10-CM | POA: Diagnosis present

## 2022-11-29 DIAGNOSIS — E1142 Type 2 diabetes mellitus with diabetic polyneuropathy: Secondary | ICD-10-CM | POA: Diagnosis present

## 2022-11-29 DIAGNOSIS — N19 Unspecified kidney failure: Secondary | ICD-10-CM | POA: Diagnosis present

## 2022-11-29 DIAGNOSIS — H9190 Unspecified hearing loss, unspecified ear: Secondary | ICD-10-CM | POA: Diagnosis present

## 2022-11-29 DIAGNOSIS — F411 Generalized anxiety disorder: Secondary | ICD-10-CM | POA: Diagnosis present

## 2022-11-29 DIAGNOSIS — E7800 Pure hypercholesterolemia, unspecified: Secondary | ICD-10-CM | POA: Diagnosis present

## 2022-11-29 DIAGNOSIS — Z1152 Encounter for screening for COVID-19: Secondary | ICD-10-CM | POA: Diagnosis not present

## 2022-11-29 DIAGNOSIS — E871 Hypo-osmolality and hyponatremia: Secondary | ICD-10-CM | POA: Diagnosis present

## 2022-11-29 DIAGNOSIS — Z7982 Long term (current) use of aspirin: Secondary | ICD-10-CM | POA: Diagnosis not present

## 2022-11-29 DIAGNOSIS — R2990 Unspecified symptoms and signs involving the nervous system: Secondary | ICD-10-CM | POA: Diagnosis not present

## 2022-11-29 DIAGNOSIS — Z681 Body mass index (BMI) 19 or less, adult: Secondary | ICD-10-CM | POA: Diagnosis not present

## 2022-11-29 LAB — COMPREHENSIVE METABOLIC PANEL
ALT: 17 U/L (ref 0–44)
AST: 21 U/L (ref 15–41)
Albumin: 3.3 g/dL — ABNORMAL LOW (ref 3.5–5.0)
Alkaline Phosphatase: 64 U/L (ref 38–126)
Anion gap: 12 (ref 5–15)
BUN: 21 mg/dL (ref 8–23)
CO2: 22 mmol/L (ref 22–32)
Calcium: 9.3 mg/dL (ref 8.9–10.3)
Chloride: 102 mmol/L (ref 98–111)
Creatinine, Ser: 0.79 mg/dL (ref 0.44–1.00)
GFR, Estimated: >60 mL/min (ref >60)
Glucose, Bld: 170 mg/dL — ABNORMAL HIGH (ref 70–99)
Potassium: 3.7 mmol/L (ref 3.5–5.1)
Sodium: 136 mmol/L (ref 135–145)
Total Bilirubin: 1.2 mg/dL (ref 0.3–1.2)
Total Protein: 5.7 g/dL — ABNORMAL LOW (ref 6.5–8.1)

## 2022-11-29 LAB — CBC
HCT: 39.5 % (ref 36.0–46.0)
Hemoglobin: 12.8 g/dL (ref 12.0–15.0)
MCH: 29 pg (ref 26.0–34.0)
MCHC: 32.4 g/dL (ref 30.0–36.0)
MCV: 89.4 fL (ref 80.0–100.0)
Platelets: 244 10*3/uL (ref 150–400)
RBC: 4.42 MIL/uL (ref 3.87–5.11)
RDW: 14.3 % (ref 11.5–15.5)
WBC: 6.2 10*3/uL (ref 4.0–10.5)
nRBC: 0 % (ref 0.0–0.2)

## 2022-11-29 LAB — LIPID PANEL
Cholesterol: 167 mg/dL (ref 0–200)
HDL: 81 mg/dL (ref >40)
LDL Cholesterol: 69 mg/dL (ref 0–99)
Total CHOL/HDL Ratio: 2.1 RATIO
Triglycerides: 87 mg/dL (ref <150)
VLDL: 17 mg/dL (ref 0–40)

## 2022-11-29 LAB — GLUCOSE, CAPILLARY
Glucose-Capillary: 137 mg/dL — ABNORMAL HIGH (ref 70–99)
Glucose-Capillary: 170 mg/dL — ABNORMAL HIGH (ref 70–99)
Glucose-Capillary: 171 mg/dL — ABNORMAL HIGH (ref 70–99)
Glucose-Capillary: 260 mg/dL — ABNORMAL HIGH (ref 70–99)
Glucose-Capillary: 266 mg/dL — ABNORMAL HIGH (ref 70–99)
Glucose-Capillary: 300 mg/dL — ABNORMAL HIGH (ref 70–99)

## 2022-11-29 LAB — ECHOCARDIOGRAM COMPLETE
AR max vel: 1.47 cm2
AV Area VTI: 1.57 cm2
AV Area mean vel: 1.37 cm2
AV Mean grad: 2 mmHg
AV Peak grad: 3.4 mmHg
Ao pk vel: 0.93 m/s
Area-P 1/2: 3.53 cm2
Calc EF: 60.5 %
Height: 61 in
MV M vel: 1.88 m/s
MV Peak grad: 14.1 mmHg
P 1/2 time: 441 msec
S' Lateral: 2.5 cm
Single Plane A2C EF: 54.5 %
Single Plane A4C EF: 62.9 %
Weight: 1632 oz

## 2022-11-29 LAB — HEMOGLOBIN A1C
Hgb A1c MFr Bld: 8.7 % — ABNORMAL HIGH (ref 4.8–5.6)
Mean Plasma Glucose: 202.99 mg/dL

## 2022-11-29 MED ORDER — MENTHOL 3 MG MT LOZG: 1.0000 | LOZENGE | OROMUCOSAL | Status: DC | PRN

## 2022-11-29 MED ORDER — INSULIN ASPART 100 UNIT/ML IJ SOLN
0.0000 [IU] | Freq: Three times a day (TID) | INTRAMUSCULAR | Status: DC
  Administered 2022-11-29 – 2022-11-30 (×2): 5 [IU] via SUBCUTANEOUS
  Administered 2022-11-30: 2 [IU] via SUBCUTANEOUS
  Administered 2022-11-30: 7 [IU] via SUBCUTANEOUS
  Administered 2022-12-01: 2 [IU] via SUBCUTANEOUS
  Administered 2022-12-01: 5 [IU] via SUBCUTANEOUS
  Administered 2022-12-01: 3 [IU] via SUBCUTANEOUS
  Administered 2022-12-02: 2 [IU] via SUBCUTANEOUS
  Administered 2022-12-02: 5 [IU] via SUBCUTANEOUS
  Administered 2022-12-02: 2 [IU] via SUBCUTANEOUS
  Administered 2022-12-03: 5 [IU] via SUBCUTANEOUS
  Administered 2022-12-03 (×2): 2 [IU] via SUBCUTANEOUS
  Administered 2022-12-04: 5 [IU] via SUBCUTANEOUS
  Administered 2022-12-04: 3 [IU] via SUBCUTANEOUS

## 2022-11-29 MED ORDER — SODIUM CHLORIDE 0.9 % IV SOLN
1.0000 g | INTRAVENOUS | Status: AC
  Administered 2022-11-29 – 2022-12-01 (×3): 1 g via INTRAVENOUS
  Filled 2022-11-29 (×3): qty 10

## 2022-11-29 MED ORDER — INSULIN ASPART 100 UNIT/ML IJ SOLN
0.0000 [IU] | Freq: Every day | INTRAMUSCULAR | Status: DC
  Administered 2022-11-29: 3 [IU] via SUBCUTANEOUS
  Administered 2022-12-02 – 2022-12-03 (×2): 2 [IU] via SUBCUTANEOUS

## 2022-11-29 NOTE — Progress Notes (Signed)
Mobility Specialist Progress Note    11/29/22 1704  Mobility  Activity Ambulated with assistance to bathroom;Ambulated with assistance in hallway  Level of Assistance Contact guard assist, steadying assist  Assistive Device Front wheel walker  Distance Ambulated (ft) 500 ft  Activity Response Tolerated well  Mobility Referral Yes  $Mobility charge 1 Mobility   Pre-Mobility: 80 HR  Pt received in bed requesting to void. No complaints on walk. Returned to chair with call bell in reach and RN present.   Hildred Alamin Mobility Specialist  Please Psychologist, sport and exercise or Rehab Office at [phone]

## 2022-11-29 NOTE — Progress Notes (Signed)
  Echocardiogram 2D Echocardiogram has been performed.  Lana Fish 11/29/2022, 9:20 AM

## 2022-11-29 NOTE — TOC Initial Note (Signed)
Transition of Care [facility]) - Initial/Assessment Note    Patient Details  Name: Kiara Blair MRN: [id number] Date of Birth: 07/04/1930  [facility]) CM/SW Contact:    Geralynn Ochs, LCSW Phone Number: 11/29/2022, 12:52 PM  Clinical Narrative:    CSW met with son at bedside to discuss recommendation for SNF. Family in agreement with SNF, and moving towards placing patient in [place] after SNF because patient cannot go home. CSW provided bed offers and family to discuss, will get back to CSW.               Expected Discharge Plan: Skilled Nursing Facility Barriers to Discharge: Continued Medical Work up   Patient Goals and CMS Choice Patient states their goals for this hospitalization and ongoing recovery are:: patient unable to participate in goal setting, not oriented CMS Medicare.gov Compare Post Acute Care list provided to:: Patient Represenative (must comment) Choice offered to / list presented to : Adult [place] ownership interest in [facility].provided to:: Adult Children    Expected Discharge Plan and Services     Post Acute Care Choice: [place] Living arrangements for the past 2 months: Single Family Home                                      Prior Living Arrangements/Services Living arrangements for the past 2 months: Single Family Home Lives with:: Self Patient language and need for interpreter reviewed:: No Do you feel safe going back to the place where you live?: Yes      Need for Family Participation in Patient Care: Yes (Comment) Care giver support system in place?: No (comment)   Criminal Activity/Legal Involvement Pertinent to Current Situation/Hospitalization: No - Comment as needed  Activities of Daily Living Home Assistive Devices/Equipment: CBG Meter, Walker (specify type), Hearing aid, Eyeglasses, Built-in shower seat, Raised toilet seat with rails ADL Screening (condition at time of  admission) Patient's cognitive ability adequate to safely complete daily activities?: No Is the patient deaf or have difficulty hearing?: Yes Does the patient have difficulty seeing, even when wearing glasses/contacts?: No Does the patient have difficulty concentrating, remembering, or making decisions?: Yes Patient able to express need for assistance with ADLs?: Yes Does the patient have difficulty dressing or bathing?: No Independently performs ADLs?: No Communication: Independent Dressing (OT): Independent Grooming: Independent Feeding: Independent Bathing: Appropriate for developmental age 18: Independent In/Out Bed: Independent with device (comment) Walks in Home: Independent with device (comment) Does the patient have difficulty walking or climbing stairs?: Yes Weakness of Legs: Both Weakness of Arms/Hands: Both  Permission Sought/Granted Permission sought to share information with : Facility Sport and exercise psychologist, Family Supports Permission granted to share information with : Yes, Verbal Permission Granted  Share Information with NAME: Tonia Brooms  Permission granted to share info w AGENCY: SNF  Permission granted to share info w Relationship: Sons     Emotional Assessment   Attitude/Demeanor/Rapport: Unable to Assess Affect (typically observed): Unable to Assess Orientation: : Oriented to Self Alcohol / Substance Use: Not Applicable Psych Involvement: No (comment)  Admission diagnosis:  Uremia [N19] Disorientation [R41.0] Stroke-like symptoms A999333 Acute metabolic encephalopathy [id number] Patient Active Problem List   Diagnosis Date Noted   Acute metabolic encephalopathy Q000111Q   Stroke-like symptoms 11/27/2022   Generalized anxiety disorder 03/22/2022   History of CVA (cerebrovascular accident) 01/04/2021   Essential hypertension 01/03/2021  Diabetes mellitus type 2 in nonobese ([place]) 01/03/2021   Paresthesia 05/01/2020   Nuclear  sclerotic cataract of left eye 03/29/2018   Sensorineural hearing loss (SNHL) of both ears 03/13/2017   Gastroesophageal reflux disease 06/26/2016   Diabetic polyneuropathy associated with type 2 diabetes mellitus ([place]) 01/10/2016   Essential tremor 10/22/2015   Hyperlipidemia LDL goal <100 10/22/2015   PCP:  Rich Fuchs, PA Pharmacy:   [id number] [place], [place] - [id number] [id number]: [phone]: [phone]    Social Determinants of Health ([place] Social History: SDOH Screenings   Food Insecurity: No Food Insecurity (11/28/2022)  Housing: Low Risk  (11/28/2022)  Transportation Needs: No Transportation Needs (11/28/2022)  Utilities: Not At Risk (11/28/2022)  Tobacco Use: Low Risk  (11/28/2022)   SDOH Interventions:     Readmission Risk Interventions     No data to display

## 2022-11-29 NOTE — Progress Notes (Signed)
SLP Cancellation Note  Patient Details Name: Kiara Blair MRN: [id number] DOB: September 26, 1930   Cancelled treatment:       Reason Eval/Treat Not Completed: Patient at procedure or test/unavailable (Pt off unit for MRI. SLP will follow up later.)  Aftyn Nott I. Hardin Negus, [place], [place] Office number [phone]  Horton Marshall 11/29/2022, 9:52 AM

## 2022-11-29 NOTE — Progress Notes (Signed)
Physical Therapy Treatment Patient Details Name: Kiara Blair MRN: [id number] DOB: [birth year] Today's Date: 11/29/2022   History of Present Illness 89 y.o. female presents to [facility] hospital on 11/27/2022 with AMS and weakness. In ED pt with R weakness and difficulty eating. PMH: DM2, HTN, RUE tremor, CVA.    PT Comments    Pt pleasant and agreeable to participate in physical therapy session. Ambulating room distances with a walker and up to moderate assist for balance. Able to perform ADL's at sink with guarding for stability. Tendency for right lateral lean, but able to correct with cueing. Pt presents as a high fall risk based on decreased gait speed and history of falls. Continue to recommend SNF for ongoing Physical Therapy.     Recommendations for follow up therapy are one component of a multi-disciplinary discharge planning process, led by the attending physician.  Recommendations may be updated based on patient status, additional functional criteria and insurance authorization.  Follow Up Recommendations  Skilled nursing-short term rehab (<3 hours/day) Can patient physically be transported by private vehicle: Yes   Assistance Recommended at Discharge Frequent or constant Supervision/Assistance  Patient can return home with the following A little help with walking and/or transfers;A little help with bathing/dressing/bathroom;Assistance with cooking/housework;Direct supervision/assist for medications management;Direct supervision/assist for financial management;Assist for transportation;Help with stairs or ramp for entrance   Equipment Recommendations  BSC/3in1    Recommendations for Other Services       Precautions / Restrictions Precautions Precautions: Fall Precaution Comments: essential tremor Restrictions Weight Bearing Restrictions: No     Mobility  Bed Mobility Overal bed mobility: Needs Assistance Bed Mobility: Supine to Sit, Sit to Supine     Supine to sit: Min  assist Sit to supine: Min assist   General bed mobility comments: Assist for trunk elevation to upright and LE back into bed    Transfers Overall transfer level: Needs assistance Equipment used: Rolling walker (2 wheels) Transfers: Sit to/from Stand Sit to Stand: Min assist, Mod assist           General transfer comment: MinA from edge of bed, modA from lower toilet surface    Ambulation/Gait Ambulation/Gait assistance: Mod assist Gait Distance (Feet): 20 Feet Assistive device: Rolling walker (2 wheels) Gait Pattern/deviations: Step-through pattern, Drifts right/left, Decreased stride length, Trunk flexed Gait velocity: reduced     General Gait Details: pt with lateral drift to right side, requires assistance from PT to correct and prevent potential loss of balance/fall.   Stairs             Wheelchair Mobility    Modified Rankin (Stroke Patients Only) Modified Rankin (Stroke Patients Only) Pre-Morbid Rankin Score: Moderate disability Modified Rankin: Moderately severe disability     Balance Overall balance assessment: Needs assistance Sitting-balance support: No upper extremity supported, Feet supported Sitting balance-Leahy Scale: Fair     Standing balance support: Bilateral upper extremity supported, Reliant on assistive device for balance Standing balance-Leahy Scale: Poor Standing balance comment: Pt bracing anteriorly with ADL's at sink and requiring min guard-minA for static standing balance                            Cognition Arousal/Alertness: Awake/alert Behavior During Therapy: WFL for tasks assessed/performed Overall Cognitive Status: Impaired/Different from baseline Area of Impairment: Safety/judgement, Awareness, Problem solving  Safety/Judgement: Decreased awareness of safety, Decreased awareness of deficits Awareness: Emergent Problem Solving: Slow processing, Requires verbal cues           Exercises      General Comments        Pertinent Vitals/Pain Pain Assessment Pain Assessment: No/denies pain    Home Living                          Prior Function            PT Goals (current goals can now be found in the care plan section) Acute Rehab PT Goals Patient Stated Goal: to reduce falls risk PT Goal Formulation: With patient/family Time For Goal Achievement: 12/12/22 Potential to Achieve Goals: Fair Progress towards PT goals: Progressing toward goals    Frequency    Min 2X/week      PT Plan Frequency needs to be updated    Co-evaluation              AM-PAC PT "6 Clicks" Mobility   Outcome Measure  Help needed turning from your back to your side while in a flat bed without using bedrails?: A Little Help needed moving from lying on your back to sitting on the side of a flat bed without using bedrails?: A Little Help needed moving to and from a bed to a chair (including a wheelchair)?: A Little Help needed standing up from a chair using your arms (e.g., wheelchair or bedside chair)?: A Little Help needed to walk in hospital room?: A Lot Help needed climbing 3-5 steps with a railing? : Total 6 Click Score: 15    End of Session   Activity Tolerance: Patient tolerated treatment well Patient left: in bed;with call bell/phone within reach;with family/visitor present (for echo) Nurse Communication: Mobility status PT Visit Diagnosis: Other abnormalities of gait and mobility (R26.89);Muscle weakness (generalized) (M62.81);History of falling (Z91.81)     Time: [id number] PT Time Calculation (min) (ACUTE ONLY): 29 min  Charges:  $Therapeutic Activity: 23-37 mins                     Wyona Almas, PT, DPT Acute Rehabilitation Services Office [phone]    Deno Etienne 11/29/2022, 9:30 AM

## 2022-11-29 NOTE — Progress Notes (Signed)
EEG complete - results pending 

## 2022-11-29 NOTE — Procedures (Signed)
Patient Name: Kiara Blair  MRN: [id number]  Epilepsy Attending: Lora Havens  Referring Physician/Provider: Rosalin Hawking, MD  Date: 11/29/2022 Duration: 24.35 mins  Patient history: 89yo F with ams. EEG to evaluate for seizure  Level of alertness: Awake, asleep  AEDs during EEG study: None  Technical aspects: This EEG study was done with scalp electrodes positioned according to the 10-20 International system of electrode placement. Electrical activity was reviewed with band pass filter of 1-70Hz$ , sensitivity of 7 uV/mm, display speed of 15m/sec with a 60Hz$  notched filter applied as appropriate. EEG data were recorded continuously and digitally stored.  Video monitoring was available and reviewed as appropriate.  Description: The posterior dominant rhythm consists of 8-9 Hz activity of moderate voltage (25-35 uV) seen predominantly in posterior head regions, symmetric and reactive to eye opening and eye closing. Sleep was characterized by vertex waves, sleep spindles (12 to 14 Hz), maximal frontocentral region. Physiologic photic driving was not seen during photic stimulation.  Hyperventilation was not performed.     IMPRESSION: This study is within normal limits. No seizures or epileptiform discharges were seen throughout the recording.  [name] OBarbra Sarks

## 2022-11-29 NOTE — Evaluation (Signed)
Occupational Therapy Evaluation Patient Details Name: Kiara Blair MRN: [id number] DOB: 24-Jul-1930 Today's Date: 11/29/2022   History of Present Illness 89 y.o. female presents to [facility] hospital on 11/27/2022 with AMS and weakness. In ED pt with R weakness and difficulty eating. PMH: DM2, HTN, RUE tremor, CVA.   Clinical Impression   Prior to this admission, patient living alone, with sons getting groceries, paying bills, and reminding her to take her medications via phone call. Son endorses multiple falls and leaving the stove on and unattended. Currently, patient is presenting with confusion, and need for increased assist to complete all ADLs. Of note, patient has essential tremors which also impacts ADL management. Patient mod-max A for ADLs, and min A for transfers and ambulation. OT recommending SNF at discharge, with sons planning to move patient into [facility] after rehab. OT will continue to follow.      Recommendations for follow up therapy are one component of a multi-disciplinary discharge planning process, led by the attending physician.  Recommendations may be updated based on patient status, additional functional criteria and insurance authorization.   Follow Up Recommendations  Skilled nursing-short term rehab (<3 hours/day)     Assistance Recommended at Discharge Frequent or constant Supervision/Assistance  Patient can return home with the following A little help with walking and/or transfers;A lot of help with bathing/dressing/bathroom;Assistance with cooking/housework;Direct supervision/assist for medications management;Direct supervision/assist for financial management;Assist for transportation;Help with stairs or ramp for entrance    Functional Status Assessment  Patient has had a recent decline in their functional status and demonstrates the ability to make significant improvements in function in a reasonable and predictable amount of time.  Equipment Recommendations   Other (comment) (Defer to next venue)    Recommendations for Other Services       Precautions / Restrictions Precautions Precautions: Fall Precaution Comments: essential tremor Restrictions Weight Bearing Restrictions: No      Mobility Bed Mobility Overal bed mobility: Needs Assistance Bed Mobility: Supine to Sit, Sit to Supine     Supine to sit: Min assist, Mod assist Sit to supine: Min assist   General bed mobility comments: Assist for trunk elevation to upright and LE back into bed    Transfers Overall transfer level: Needs assistance Equipment used: Rolling walker (2 wheels)               General transfer comment: transfers deferred as patient had recently been out to bed and fatigued      Balance Overall balance assessment: Needs assistance Sitting-balance support: No upper extremity supported, Feet supported Sitting balance-Leahy Scale: Fair     Standing balance support: Bilateral upper extremity supported, Reliant on assistive device for balance Standing balance-Leahy Scale: Poor                             ADL either performed or assessed with clinical judgement   ADL Overall ADL's : Needs assistance/impaired Eating/Feeding: Moderate assistance;Sitting Eating/Feeding Details (indicate cue type and reason): due to essential tremors Grooming: Moderate assistance;Sitting;Minimal assistance Grooming Details (indicate cue type and reason): due to essential tremors Upper Body Bathing: Minimal assistance;Moderate assistance;Sitting   Lower Body Bathing: Maximal assistance;Total assistance;Sit to/from stand;Sitting/lateral leans   Upper Body Dressing : Minimal assistance;Moderate assistance;Sitting   Lower Body Dressing: Maximal assistance;Total assistance;Sit to/from stand;Sitting/lateral leans   Toilet Transfer: Moderate assistance;Stand-pivot;BSC/3in1   Toileting- Clothing Manipulation and Hygiene: Moderate assistance;Sit to/from  stand;Sitting/lateral lean  Functional mobility during ADLs: Moderate assistance;Cueing for sequencing;Cueing for safety General ADL Comments: Patient presenting with confusion, weakness, and need for increased assist to complete ADLs     Vision Baseline Vision/History: 1 Wears glasses Ability to See in Adequate Light: 0 Adequate Patient Visual Report: No change from baseline Vision Assessment?: No apparent visual deficits     Perception     Praxis      Pertinent Vitals/Pain Pain Assessment Pain Assessment: No/denies pain     Hand Dominance Right   Extremity/Trunk Assessment Upper Extremity Assessment Upper Extremity Assessment: Generalized weakness   Lower Extremity Assessment Lower Extremity Assessment: Defer to PT evaluation   Cervical / Trunk Assessment Cervical / Trunk Assessment: Kyphotic   Communication Communication Communication: HOH (hears better from left ear)   Cognition Arousal/Alertness: Awake/alert Behavior During Therapy: WFL for tasks assessed/performed Overall Cognitive Status: Impaired/Different from baseline Area of Impairment: Safety/judgement, Awareness, Problem solving, Orientation, Attention, Memory, Following commands                 Orientation Level: Place, Time, Situation Current Attention Level: Sustained Memory: Decreased recall of precautions, Decreased short-term memory Following Commands: Follows one step commands with increased time, Follows multi-step commands inconsistently Safety/Judgement: Decreased awareness of safety, Decreased awareness of deficits Awareness: Emergent Problem Solving: Slow processing, Requires verbal cues General Comments: Stating it was April 1942, easily distracted     General Comments       Exercises     Shoulder Instructions      Home Living Family/patient expects to be discharged to:: Skilled nursing facility (son feels pt may need long term placement) Living Arrangements:  Alone Available Help at Discharge: Family;Available PRN/intermittently Type of Home: House Home Access: Stairs to enter [employer] of Steps: 2 Entrance Stairs-Rails: None Home Layout: Multi-level Alternate Level Stairs-Number of Steps: 2 steps to bedroom Alternate Level Stairs-Rails: Right;Left;Can reach both Bathroom Shower/Tub: Teacher, early years/pre: Standard     Home Equipment: Conservation officer, nature (2 wheels);Wheelchair - manual          Prior Functioning/Environment Prior Level of Function : History of Falls (last six months)             Mobility Comments: pt with many falls within the last year and with multiple injuries per son. Typically ambulates with RW and has had falls with use of walker ADLs Comments: able to complete with significant time, uses a button hook, sons complete finances and groceries, provide constant reminders for medication        OT Problem List: Decreased strength;Decreased range of motion;Decreased activity tolerance;Impaired balance (sitting and/or standing);Decreased coordination;Decreased cognition;Decreased safety awareness;Decreased knowledge of precautions      OT Treatment/Interventions: Self-care/ADL training;Therapeutic exercise;Energy conservation;DME and/or AE instruction;Manual therapy;Cognitive remediation/compensation;Patient/family education;Balance training;Therapeutic activities    OT Goals(Current goals can be found in the care plan section) Acute Rehab OT Goals Patient Stated Goal: to get better OT Goal Formulation: With patient Time For Goal Achievement: 12/13/22 Potential to Achieve Goals: Good ADL Goals Pt Will Perform Lower Body Bathing: with min assist;sitting/lateral leans;sit to/from stand Pt Will Perform Lower Body Dressing: with min assist;sit to/from stand;sitting/lateral leans Pt Will Transfer to Toilet: with min assist;ambulating;regular height toilet;grab bars Pt Will Perform Toileting -  Clothing Manipulation and hygiene: with min assist;sitting/lateral leans;sit to/from stand Additional ADL Goal #1: Patient will be able to follow 2 step commands consistently without cues to promote prior level of independence.  OT Frequency: Min 2X/week    Co-evaluation  AM-PAC OT "6 Clicks" Daily Activity     Outcome Measure Help from another person eating meals?: A Lot Help from another person taking care of personal grooming?: A Lot Help from another person toileting, which includes using toliet, bedpan, or urinal?: A Lot Help from another person bathing (including washing, rinsing, drying)?: A Lot Help from another person to put on and taking off regular upper body clothing?: A Little Help from another person to put on and taking off regular lower body clothing?: A Lot 6 Click Score: 13   End of Session Nurse Communication: Mobility status  Activity Tolerance: Patient tolerated treatment well Patient left: in bed;with call bell/phone within reach;with bed alarm set;with family/visitor present  OT Visit Diagnosis: Unsteadiness on feet (R26.81);Other abnormalities of gait and mobility (R26.89);Repeated falls (R29.6);Muscle weakness (generalized) (M62.81);History of falling (Z91.81);Other symptoms and signs involving cognitive function;Adult, failure to thrive (R62.7)                Time: [id number] OT Time Calculation (min): 20 min Charges:  OT General Charges $OT Visit: 1 Visit OT Evaluation $OT Eval Moderate Complexity: 1 Mod  Corinne Ports E. Nilan Iddings, OTR/L Acute Rehabilitation Services [phone]   Ascencion Dike 11/29/2022, 3:19 PM

## 2022-11-29 NOTE — [place] FL2 (Signed)
[place] MEDICAID FL2 LEVEL OF CARE FORM     IDENTIFICATION  Patient Name: Kiara Blair Birthdate: 1930/09/08 Sex: female Admission Date (Current Location): 11/27/2022  [facility] and [place] Number:  [employer] and Address:  [facility] [place] [address], [place],  [address]      Provider Number: [id number]  Attending Physician Name and Address:  Little Ishikawa, MD  Relative Name and Phone Number:       Current Level of Care: Hospital Recommended Level of Care: [place] Prior Approval Number:    Date Approved/Denied:   PASRR Number: [id number]  Discharge Plan: SNF    Current Diagnoses: Patient Active Problem List   Diagnosis Date Noted   Acute metabolic encephalopathy [id number]   Stroke-like symptoms 11/27/2022   Generalized anxiety disorder 03/22/2022   History of CVA (cerebrovascular accident) 01/04/2021   Essential hypertension 01/03/2021   Diabetes mellitus type 2 in nonobese [facility]) 01/03/2021   Paresthesia 05/01/2020   Nuclear sclerotic cataract of left eye 03/29/2018   Sensorineural hearing loss (SNHL) of both ears 03/13/2017   Gastroesophageal reflux disease 06/26/2016   Diabetic polyneuropathy associated with type 2 diabetes mellitus ([place]) 01/10/2016   Essential tremor 10/22/2015   Hyperlipidemia LDL goal <100 10/22/2015    Orientation RESPIRATION BLADDER Height & Weight     Self, Place  Normal Continent Weight: 102 lb (46.3 kg) Height:  5' 1"$  (154.9 cm)  BEHAVIORAL SYMPTOMS/MOOD NEUROLOGICAL BOWEL NUTRITION STATUS      Continent Diet (regular)  AMBULATORY STATUS COMMUNICATION OF NEEDS Skin   Limited Assist Verbally PU Stage and Appropriate Care PU Stage 1 Dressing:  (sacrum, foam dressing: lift every shift to assess, change PRN)                     Personal Care Assistance Level of Assistance  Bathing, Feeding, Dressing Bathing Assistance: Limited assistance Feeding assistance:  Limited assistance Dressing Assistance: Limited assistance     Functional Limitations Info  Hearing   Hearing Info: Impaired (hard of hearing)      SPECIAL CARE FACTORS FREQUENCY  PT (By licensed PT), OT (By licensed OT)     PT Frequency: 5x/wk OT Frequency: 5x/wk            Contractures Contractures Info: Not present    Additional Factors Info  Code Status, Allergies, Insulin Sliding Scale Code Status Info: Full Allergies Info: Doxycycline Hyclate, Levofloxacin, Meloxicam, Naproxen-esomeprazole Mg, Nitrofurantoin, Other, Pneumococcal Polysaccharide Vaccine   Insulin Sliding Scale Info: see DC summary       Current Medications (11/29/2022):  This is the current hospital active medication list Current Facility-Administered Medications  Medication Dose Route Frequency Provider Last Rate Last Admin   acetaminophen (TYLENOL) tablet 650 mg  650 mg Oral Q6H PRN Marcelyn Bruins, MD       Or   acetaminophen (TYLENOL) suppository 650 mg  650 mg Rectal Q6H PRN Marcelyn Bruins, MD       aspirin EC tablet 81 mg  81 mg Oral Daily Marcelyn Bruins, MD       atorvastatin (LIPITOR) tablet 40 mg  40 mg Oral Daily Marcelyn Bruins, MD       cefTRIAXone (ROCEPHIN) 1 g in sodium chloride 0.9 % 100 mL IVPB  1 g Intravenous Q24H Little Ishikawa, MD       enoxaparin (LOVENOX) injection 30 mg  30 mg Subcutaneous Q24H Marcelyn Bruins, MD  30 mg at 11/28/22 1532   insulin aspart (novoLOG) injection 0-9 Units  0-9 Units Subcutaneous Q4H Marcelyn Bruins, MD   2 Units at 11/29/22 0900   polyethylene glycol (MIRALAX / GLYCOLAX) packet 17 g  17 g Oral Daily PRN Marcelyn Bruins, MD       sodium chloride flush (NS) 0.9 % injection 3 mL  3 mL Intravenous Q12H Marcelyn Bruins, MD   3 mL at 11/29/22 1032     Discharge Medications: Please see discharge summary for a list of discharge medications.  Relevant Imaging Results:  Relevant Lab Results:   Additional  Information SS#: [id number]  Geralynn Ochs, LCSW

## 2022-11-29 NOTE — Consult Note (Addendum)
STROKE TEAM CONSULT NOTE   INTERVAL HISTORY Her son is at the bedside.  No acute events overnight. Neurology consulted due to altered mental status.  Imaging unable to obtain due to patient agitation (MRI&CT).  Labs positive for UA. Patient disoriented to month and year, but knows she is in the hospital.  Patient is pleasant and able to carry on conversation, does not remember how she got to the hospital.  Follows all commands, no weakness noted, patient denies headache or dizziness.  Patient eating graham crackers with no issues swallowing during EEG test upon assessment. Lab and imaging results, plan of care discussed with son at bedside and reviewed with Dr. Avon Gully.  Son does express concern of patient going back home by herself.  He states that family wants her to go to [facility] skilled [facility] facility.  He states they have already had contact with the facility and patient has already toured.  He is however unsure if the patient will agree to go to the facility instead of going home.  Vitals:   11/29/22 0024 11/29/22 0433 11/29/22 0743 11/29/22 1143  BP: (!) 133/41 (!) 133/49 (!) 147/60 (!) 139/51  Pulse: 63 74 66 78  Resp: 16 18 16 16  $ Temp: 97.8 F (36.6 C)  98.3 F (36.8 C) 98.7 F (37.1 C)  TempSrc: Oral  Oral Oral  SpO2: 96% 100% 97% 95%  Weight:      Height:       CBC:  Recent Labs  Lab 11/27/22 1756 11/28/22 1355 11/29/22 0759  WBC 7.2 6.2 6.2  NEUTROABS 4.5  --   --   HGB 11.2* 12.8 12.8  HCT 33.3* 37.9 39.5  MCV 88.1 86.7 89.4  PLT 232 242 [id number]   Basic Metabolic Panel:  Recent Labs  Lab 11/28/22 1355 11/29/22 0759  NA 135 136  K 3.7 3.7  CL 101 102  CO2 25 22  GLUCOSE 220* 170*  BUN 16 21  CREATININE 0.66 0.79  CALCIUM 9.2 9.3  MG 1.4*  --    Lipid Panel:  Recent Labs  Lab 11/29/22 0759  CHOL 167  TRIG 87  HDL 81  CHOLHDL 2.1  VLDL 17  LDLCALC 69   HgbA1c:  Recent Labs  Lab 11/29/22 0759  HGBA1C 8.7*   Urine Drug Screen: No  results for input(s): "LABOPIA", "COCAINSCRNUR", "LABBENZ", "AMPHETMU", "THCU", "LABBARB" in the last 168 hours.  Alcohol Level No results for input(s): "ETH" in the last 168 hours.  IMAGING past 24 hours EEG adult  Result Date: 11/29/2022 Lora Havens, MD     11/29/2022 12:13 PM Patient Name: Kiara Blair MRN: [id number] Epilepsy Attending: Lora Havens Referring Physician/Provider: Rosalin Hawking, MD Date: 11/29/2022 Duration: 24.35 mins Patient history: 89yo F with ams. EEG to evaluate for seizure Level of alertness: Awake, asleep AEDs during EEG study: None Technical aspects: This EEG study was done with scalp electrodes positioned according to the 10-20 International system of electrode placement. Electrical activity was reviewed with band pass filter of 1-70Hz$ , sensitivity of 7 uV/mm, display speed of 71m/sec with a 60Hz$  notched filter applied as appropriate. EEG data were recorded continuously and digitally stored.  Video monitoring was available and reviewed as appropriate. Description: The posterior dominant rhythm consists of 8-9 Hz activity of moderate voltage (25-35 uV) seen predominantly in posterior head regions, symmetric and reactive to eye opening and eye closing. Sleep was characterized by vertex waves, sleep spindles (12 to 14 Hz), maximal frontocentral region. Physiologic  photic driving was not seen during photic stimulation.  Hyperventilation was not performed.   IMPRESSION: This study is within normal limits. No seizures or epileptiform discharges were seen throughout the recording. Lora Havens   ECHOCARDIOGRAM COMPLETE  Result Date: 11/29/2022    ECHOCARDIOGRAM REPORT   Patient Name:   Kiara Blair Date of Exam: 11/29/2022 Medical Rec #:  [id number]    Height:       61.0 in Accession #:    [id number]   Weight:       102.0 lb Date of Birth:  05/01/1930     BSA:          1.419 m Patient Age:    89 years     BP:           133/49 mmHg Patient Gender: F            HR:           75  bpm. Exam Location:  Inpatient Procedure: 2D Echo Indications:    TIA  History:        Patient has prior history of Echocardiogram examinations, most                 recent 01/04/2021. Stroke; Risk Factors:Diabetes, Hypertension                 and Dyslipidemia.  Sonographer:    Harvie Junior Referring Phys: [id number] Candace Gallus MELVIN  Sonographer Comments: Technically difficult study due to poor echo windows. Positioning. IMPRESSIONS  1. Mild intracavitary gradient. Peak velocity 0.73 m/s. Peak gradient 2.1 mmHg. Left ventricular ejection fraction, by estimation, is 60 to 65%. Left ventricular ejection fraction by PLAX is 64 %. The left ventricle has normal function. The left ventricle has no regional wall motion abnormalities. There is mild concentric left ventricular hypertrophy. Left ventricular diastolic parameters are consistent with Grade I diastolic dysfunction (impaired relaxation).  2. Right ventricular systolic function is normal. The right ventricular size is normal. There is normal pulmonary artery systolic pressure.  3. A small pericardial effusion is present. The pericardial effusion is anterior to the right ventricle. There is no evidence of cardiac tamponade.  4. The mitral valve is normal in structure. No evidence of mitral valve regurgitation. No evidence of mitral stenosis.  5. The aortic valve is tricuspid. There is mild calcification of the aortic valve. There is mild thickening of the aortic valve. Aortic valve regurgitation is mild. No aortic stenosis is present. Aortic regurgitation PHT measures 441 msec.  6. The inferior vena cava is normal in size with greater than 50% respiratory variability, suggesting right atrial pressure of 3 mmHg. FINDINGS  Left Ventricle: Mild intracavitary gradient. Peak velocity 0.73 m/s. Peak gradient 2.1 mmHg. Left ventricular ejection fraction, by estimation, is 60 to 65%. Left ventricular ejection fraction by PLAX is 64 %. The left ventricle has normal function.  The  left ventricle has no regional wall motion abnormalities. The left ventricular internal cavity size was normal in size. There is mild concentric left ventricular hypertrophy. Left ventricular diastolic parameters are consistent with Grade I diastolic dysfunction (impaired relaxation). Indeterminate filling pressures. Right Ventricle: The right ventricular size is normal. No increase in right ventricular wall thickness. Right ventricular systolic function is normal. There is normal pulmonary artery systolic pressure. The tricuspid regurgitant velocity is 1.88 m/s, and  with an assumed right atrial pressure of 3 mmHg, the estimated right ventricular systolic pressure is 0000000 mmHg. Left Atrium: Left atrial size  was normal in size. Right Atrium: Right atrial size was normal in size. Pericardium: A small pericardial effusion is present. The pericardial effusion is anterior to the right ventricle. There is no evidence of cardiac tamponade. Mitral Valve: The mitral valve is normal in structure. No evidence of mitral valve regurgitation. No evidence of mitral valve stenosis. Tricuspid Valve: The tricuspid valve is normal in structure. Tricuspid valve regurgitation is trivial. No evidence of tricuspid stenosis. Aortic Valve: The aortic valve is tricuspid. There is mild calcification of the aortic valve. There is mild thickening of the aortic valve. Aortic valve regurgitation is mild. Aortic regurgitation PHT measures 441 msec. No aortic stenosis is present. Aortic valve mean gradient measures 2.0 mmHg. Aortic valve peak gradient measures 3.4 mmHg. Aortic valve area, by VTI measures 1.57 cm. Pulmonic Valve: The pulmonic valve was normal in structure. Pulmonic valve regurgitation is not visualized. No evidence of pulmonic stenosis. Aorta: The aortic root is normal in size and structure. Venous: The inferior vena cava is normal in size with greater than 50% respiratory variability, suggesting right atrial pressure of 3  mmHg. IAS/Shunts: No atrial level shunt detected by color flow Doppler.  LEFT VENTRICLE PLAX 2D LV EF:         Left            Diastology                ventricular     LV e' medial:    3.70 cm/s                ejection        LV E/e' medial:  12.2                fraction by     LV e' lateral:   4.90 cm/s                PLAX is 64      LV E/e' lateral: 9.2                %. LVIDd:         3.80 cm LVIDs:         2.50 cm LV PW:         1.30 cm LV IVS:        1.25 cm LVOT diam:     1.70 cm LV SV:         29 LV SV Index:   20 LVOT Area:     2.27 cm  LV Volumes (MOD) LV vol d, MOD    44.8 ml A2C: LV vol d, MOD    76.6 ml A4C: LV vol s, MOD    20.4 ml A2C: LV vol s, MOD    28.4 ml A4C: LV SV MOD A2C:   24.4 ml LV SV MOD A4C:   76.6 ml LV SV MOD BP:    38.6 ml RIGHT VENTRICLE RV Basal diam:  3.20 cm RV Mid diam:    2.90 cm RV S prime:     14.80 cm/s TAPSE (M-mode): 1.6 cm LEFT ATRIUM             Index        RIGHT ATRIUM           Index LA diam:        3.00 cm 2.11 cm/m   RA Area:     10.40 cm LA Vol (A2C):   20.6 ml 14.52 ml/m  RA  Volume:   24.00 ml  16.92 ml/m LA Vol (A4C):   33.4 ml 23.54 ml/m LA Biplane Vol: 27.5 ml 19.38 ml/m  AORTIC VALVE                    PULMONIC VALVE AV Area (Vmax):    1.47 cm     PV Vmax:       0.90 m/s AV Area (Vmean):   1.37 cm     PV Peak grad:  3.2 mmHg AV Area (VTI):     1.57 cm AV Vmax:           92.60 cm/s AV Vmean:          65.100 cm/s AV VTI:            0.184 m AV Peak Grad:      3.4 mmHg AV Mean Grad:      2.0 mmHg LVOT Vmax:         59.90 cm/s LVOT Vmean:        39.400 cm/s LVOT VTI:          0.128 m LVOT/AV VTI ratio: 0.69 AI PHT:            441 msec  AORTA Ao Root diam: 2.80 cm Ao Asc diam:  3.00 cm MITRAL VALVE               TRICUSPID VALVE MV Area (PHT): 3.53 cm    TR Peak grad:   14.1 mmHg MV Decel Time: 215 msec    TR Vmax:        188.00 cm/s MR Peak grad: 14.1 mmHg MR Vmax:      188.00 cm/s  SHUNTS MV E velocity: 45.10 cm/s  Systemic VTI:  0.13 m MV A velocity:  91.70 cm/s  Systemic Diam: 1.70 cm MV E/A ratio:  0.49 Skeet Latch MD Electronically signed by Skeet Latch MD Signature Date/Time: 11/29/2022/10:21:46 AM    Final     PHYSICAL EXAM  Temp:  [97.8 F (36.6 C)-98.7 F (37.1 C)] 98.7 F (37.1 C) (02/21 1143) Pulse Rate:  [62-78] 78 (02/21 1143) Resp:  [16-18] 16 (02/21 1143) BP: (116-170)/(41-74) 139/51 (02/21 1143) SpO2:  [95 %-100 %] 95 % (02/21 1143)  General - Well nourished, well developed, in no apparent distress. Cardiovascular - Regular rhythm and rate. Pulmonary -unlabored breathing, no supplemental oxygenation needed.  Mental Status -  Patient oriented to self and place when asked for months she stated it was March when asked what year she stated it was 1944 but correctable Slight  dysarthria no aphasia noted.  Good attention span and concentration. Patient stated that her boyfriend lives with her.  However she does live alone currently.  Cranial Nerves II - XII - II - Visual field intact OU. III, IV, VI - Extraocular movements intact. V - Facial sensation intact bilaterally. VII - Facial movement intact bilaterally. VIII - Hearing & vestibular intact bilaterally. X - Palate elevates symmetrically. XI - Chin turning & shoulder shrug intact bilaterally. XII - Tongue protrusion intact.  Motor Strength - pronator drift was absent, left arm and leg weaker than right..   Motor Tone - Muscle tone was assessed at the neck and appendages and was normal.  Sensory - Light touch assessed and symmetrical.    Coordination -slight tremor seen in right upper extremity.  Son states that this is baseline.  Finger-nose test slow but intact..  Gait and Station - deferred.  ASSESSMENT/PLAN Ms.  Kiara Blair is a 89 y.o. 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  Initial head CT negative.  However unable to obtain further imaging due to patient intolerance and claustrophobia.  Labs positive for UTI, respiratory panel negative. With improvement in patient's presentation, inability to pursue further imaging due to patient's claustrophobia/agitation, recommend discontinuing imaging orders.  Patient's confusion most likely due to acute metabolic encephalopathy secondary to urinary tract infection.  This assessment was discussed with son at bedside.  Likely acute metabolic encephalopathy, due to ? UTI CT head x 2 on 2/19 No acute abnormality.  MRI 2/21 not able to tolerate Repeat CT and CTA head and neck 2/21 not able to tolerate 2D Echo: EF 60 to 65%  EEG within normal limits LDL 69 HgbA1c 8.7 VTE prophylaxis -Lovenox aspirin 81 mg daily prior to admission, now on aspirin 81 mg daily. Continue on discharge Therapy recommendations:  SNF Disposition:  pending.  Currently no focal neuro deficit, discussed with pt sons, will not pursue at this time given her advanced age, no focal deficit and refused further neuroimaging  History of chronic left-sided numbness Was seen in 04/2020 by attending neurology MRI negative. 12/2020 admitted for worsening left-sided numbness.  EEG negative.  MRI/MRA negative.  EF 60 to 65%.  LDL 120, A1c 8.6.  Discharged on aspirin and Lipitor.  ? UTI UA WBC 6-10 On Rocephin Symptoms improved  Hypertension Home meds: Lotrel Stable Long-term goal: Normotensive  Hyperlipidemia Home meds:  Lipitor 34m, resumed in hospital LDL 69, goal < 70 Continue statin at discharge  Diabetes type II Uncontrolled Home meds:  Kazano, HgbA1c 8.7, goal < 7.0 CBGs SSI Close PCP follow-up for better DM control  Other Stroke Risk Factors Advanced Age >/= 673  Hospital day # 0  Thank you for this consult neurology will continue to follow as needed.  *Please note that this is a verbal  dictation therefore any spelling or grammatical errors are due to the "[employer]" system interpretation.    Pt seen by Neuro NP/APP and later by MD. Note/plan to be edited by MD as needed.    EOtelia Santee DNP, AGACNP-BC [facility] Please use AMION for pager and EPIC for messaging  ATTENDING NOTE: I reviewed above note and agree with the assessment and plan. Pt was seen and examined.   2 sons are at bedside.  Patient awake alert, orientated to self, people, place and age but not to time.  No aphasia, mild dysarthria, follows simple commands, able to name repeat.  No gaze palsy, visual fields full, facial symmetrical, moving all extremities symmetrically.  Bilateral finger-to-nose intact.  Sensation subjectively intact.  Per sons, patient symptoms much improved, however still has mild dysarthria seems worse than baseline.  However, otherwise close to baseline.  At baseline, patient has memory issues and intermittent confusion.  This time patient was found to have possible UTI, on Rocephin and symptoms improved.  Overall symptoms could be due to encephalopathy in the setting of UTI.  Patient was not tolerating MRI and CT as well as CTA head and neck today.  Given no focal deficit, advanced age, we will not pursue further neuroimaging at this time.  Sons are in agreement.  Continue aspirin and statin on discharge.  For detailed assessment and plan, please refer to above/below as I have made changes  wherever appropriate.   Neurology will sign off. Please call with questions. Thanks for the consult.   Rosalin Hawking, MD PhD Stroke Neurology 11/29/2022 4:49 PM   To contact Stroke Continuity provider, please refer to [email or web address] After hours, contact General Neurology

## 2022-11-29 NOTE — Progress Notes (Signed)
PROGRESS NOTE    Kiara Blair  [id number] DOB: 09/20/1930 DOA: 11/27/2022 PCP: Rich Fuchs, PA   Brief Narrative:  Kiara Blair is a 89 y.o. female with medical history significant of stroke, diabetes, hypertension, hyperlipidemia, neuropathy, tremor, GERD, anxiety, cataract, hearing loss, paresthesia presenting with altered mental status and weakness. History of slurred speech and L sided paralysis previously - questionable Right sided weakness and dysphagia concerning for stroke - hospitalist called for admission.  Assessment & Plan:   Principal Problem:   Stroke-like symptoms Active Problems:   Essential hypertension   Diabetes mellitus type 2 in nonobese (HCC)   History of CVA (cerebrovascular accident)   Diabetic polyneuropathy associated with type 2 diabetes mellitus (HCC)   Gastroesophageal reflux disease   Generalized anxiety disorder   Hyperlipidemia LDL goal [id number]   Acute metabolic encephalopathy UTI, POA - Ceftriaxone x3 days - Symptoms confirmed at bedside with patient, limited history but admits to increased frequency/hesitancy/urgency with urination this week consistent with her typical UTI symptoms concurrent with metabolic encephalopathy -Patient does not meet sepsis criteria  Stroke-like symptoms, cannot rule out acute CVA, POA History of prior stroke -Transient episode of unilateral weakness on the right, baseline weakness on the left from prior stroke -Initial CT negative, MRI unable to be obtained due to patient's intolerance and claustrophobia -Neurology consulted for additional insight and recommendations.  Appreciate their insight and recommendations. -Repeat CT pending per neurology -Echocardiogram EF 60 to 65% with normal left ventricular function without regional wall abnormalities but mild concentric hypertrophy, grade 1 diastolic dysfunction noted.  Valves without any overt vegetation or abnormalities other than mild calcification of the aortic  valve. - SLP eval and treat - PT/OT following, current recommendation for SNF given ongoing ambulatory dysfunction and fall risk -A1c elevated 8.7  Uncontrolled diabetes with hyperglycemia - SSI ongoing -Resume home regimen at discharge -A1c 8.7 - would prefer elevated glucose in this age group over tightly controlled A1c for risk of symptoms/hypoglycemia   Hypertension - Holding home antihypertensives for permissive HTN   Hyperlipidemia - Continue home atorvastatin   Neuropathy Hearing loss History of paresthesias - Noted to be at baseline, no indication for further testing  DVT prophylaxis: Lovenox Code Status: Full Family Communication: Son at bedside  Status is: Inpatient  Dispo: The patient is from: Home              Anticipated d/c is to: SNF              Anticipated d/c date is: 48 to 72 hours              Patient currently not medically stable for discharge  Consultants:  Neurology  Procedures:  None  Antimicrobials:  Ceftriaxone x 3 days  Subjective: Unable to tolerate MRI overnight due to claustrophobia, patient's mental status somewhat improving from prior but not yet back to baseline, admits to symptoms of urinary frequency hesitancy and urgency as well as dysuria.  Objective: Vitals:   11/28/22 1536 11/28/22 2005 11/29/22 0024 11/29/22 0433  BP: 116/74 (!) 170/72 (!) 133/41 (!) 133/49  Pulse: 62 64 63 74  Resp: 16 16 16 18  $ Temp: 98.7 F (37.1 C) 97.8 F (36.6 C) 97.8 F (36.6 C)   TempSrc: Oral Oral Oral   SpO2: 95% 95% 96% 100%  Weight:      Height:        Intake/Output Summary (Last 24 hours) at 11/29/2022 0736 Last data filed at 11/28/2022 1751 Gross per  24 hour  Intake 3 ml  Output 400 ml  Net -397 ml   Filed Weights   11/27/22 1630  Weight: 46.3 kg    Examination:  General:  Pleasantly resting in bed, No acute distress.  Alert to person and general situation only. HEENT:  Normocephalic atraumatic.  Sclerae nonicteric,  noninjected.  Extraocular movements intact bilaterally. Neck:  Without mass or deformity.  Trachea is midline. Lungs:  Clear to auscultate bilaterally without rhonchi, wheeze, or rales. Heart:  Regular rate and rhythm.  Without murmurs, rubs, or gallops. Abdomen:  Soft, nontender, nondistended.  Without guarding or rebound. Extremities: Without cyanosis, clubbing, edema, or obvious deformity. Skin:  Warm and dry, no erythema, no rashes   Data Reviewed: I have personally reviewed following labs and imaging studies  CBC: Recent Labs  Lab 11/27/22 1756 11/28/22 1355  WBC 7.2 6.2  NEUTROABS 4.5  --   HGB 11.2* 12.8  HCT 33.3* 37.9  MCV 88.1 86.7  PLT 232 [id number]   Basic Metabolic Panel: Recent Labs  Lab 11/27/22 1756 11/28/22 1355  NA 130* 135  K 4.0 3.7  CL 98 101  CO2 24 25  GLUCOSE 163* 220*  BUN 30* 16  CREATININE 0.90 0.66  CALCIUM 8.8* 9.2  MG  --  1.4*   GFR: Estimated Creatinine Clearance: 34.2 mL/min (by C-G formula based on SCr of 0.66 mg/dL). Liver Function Tests: Recent Labs  Lab 11/27/22 1756 11/28/22 1355  AST 23 22  ALT 20 18  ALKPHOS 94 70  BILITOT 0.6 0.8  PROT 6.1* 5.8*  ALBUMIN 3.5 3.4*   No results for input(s): "LIPASE", "AMYLASE" in the last 168 hours. No results for input(s): "AMMONIA" in the last 168 hours. Coagulation Profile: No results for input(s): "INR", "PROTIME" in the last 168 hours. Cardiac Enzymes: No results for input(s): "CKTOTAL", "CKMB", "CKMBINDEX", "TROPONINI" in the last 168 hours. BNP (last 3 results) No results for input(s): "PROBNP" in the last 8760 hours. HbA1C: No results for input(s): "HGBA1C" in the last 72 hours. CBG: Recent Labs  Lab 11/27/22 1635 11/28/22 1537 11/28/22 2020 11/29/22 0022 11/29/22 0404  GLUCAP 196* 230* 120* 137* 170*   Lipid Profile: No results for input(s): "CHOL", "HDL", "LDLCALC", "TRIG", "CHOLHDL", "LDLDIRECT" in the last 72 hours. Thyroid Function Tests: Recent Labs     11/28/22 1355  TSH 1.882   Anemia Panel: No results for input(s): "VITAMINB12", "FOLATE", "FERRITIN", "TIBC", "IRON", "RETICCTPCT" in the last 72 hours. Sepsis Labs: No results for input(s): "PROCALCITON", "LATICACIDVEN" in the last 168 hours.  Recent Results (from the past 240 hour(s))  Resp panel by RT-PCR (RSV, Flu A&B, Covid) Anterior Nasal Swab     Status: None   Collection Time: 11/27/22  6:20 PM   Specimen: Anterior Nasal Swab  Result Value Ref Range Status   SARS Coronavirus 2 by RT PCR NEGATIVE NEGATIVE Final    Comment: (NOTE) SARS-CoV-2 target nucleic acids are NOT DETECTED.  The SARS-CoV-2 RNA is generally detectable in upper respiratory specimens during the acute phase of infection. The lowest concentration of SARS-CoV-2 viral copies this assay can detect is 138 copies/mL. A negative result does not preclude SARS-Cov-2 infection and should not be used as the sole basis for treatment or other patient management decisions. A negative result may occur with  improper specimen collection/handling, submission of specimen other than nasopharyngeal swab, presence of viral mutation(s) within the areas targeted by this assay, and inadequate number of viral copies(<138 copies/mL). A negative result must  be combined with clinical observations, patient history, and epidemiological information. The expected result is Negative.  Fact Sheet for Patients:  EntrepreneurPulse.com.au  Fact Sheet for Healthcare Providers:  IncredibleEmployment.be  This test is no t yet approved or cleared by the [place] FDA and  has been authorized for detection and/or diagnosis of SARS-CoV-2 by FDA under an Emergency Use Authorization (EUA). This EUA will remain  in effect (meaning this test can be used) for the duration of the COVID-19 declaration under Section 564(b)(1) of the Act, 21 U.S.C.section 360bbb-3(b)(1), unless the authorization is terminated   or revoked sooner.       Influenza A by PCR NEGATIVE NEGATIVE Final   Influenza B by PCR NEGATIVE NEGATIVE Final    Comment: (NOTE) The Xpert Xpress SARS-CoV-2/FLU/RSV plus assay is intended as an aid in the diagnosis of influenza from Nasopharyngeal swab specimens and should not be used as a sole basis for treatment. Nasal washings and aspirates are unacceptable for Xpert Xpress SARS-CoV-2/FLU/RSV testing.  Fact Sheet for Patients: EntrepreneurPulse.com.au  Fact Sheet for Healthcare Providers: IncredibleEmployment.be  This test is not yet approved or cleared by the [place] FDA and has been authorized for detection and/or diagnosis of SARS-CoV-2 by FDA under an Emergency Use Authorization (EUA). This EUA will remain in effect (meaning this test can be used) for the duration of the COVID-19 declaration under Section 564(b)(1) of the Act, 21 U.S.C. section 360bbb-3(b)(1), unless the authorization is terminated or revoked.     Resp Syncytial Virus by PCR NEGATIVE NEGATIVE Final    Comment: (NOTE) Fact Sheet for Patients: EntrepreneurPulse.com.au  Fact Sheet for Healthcare Providers: IncredibleEmployment.be  This test is not yet approved or cleared by the [place] FDA and has been authorized for detection and/or diagnosis of SARS-CoV-2 by FDA under an Emergency Use Authorization (EUA). This EUA will remain in effect (meaning this test can be used) for the duration of the COVID-19 declaration under Section 564(b)(1) of the Act, 21 U.S.C. section 360bbb-3(b)(1), unless the authorization is terminated or revoked.  Performed at [facility], [address] [place], [place] [address]          Radiology Studies: CT Head Wo Contrast  Result Date: 11/27/2022 CLINICAL DATA:  Persistent altered mental status EXAM: CT HEAD WITHOUT CONTRAST TECHNIQUE: Contiguous axial images were  obtained from the base of the skull through the vertex without intravenous contrast. RADIATION DOSE REDUCTION: This exam was performed according to the departmental dose-optimization program which includes automated exposure control, adjustment of the mA and/or kV according to patient size and/or use of iterative reconstruction technique. COMPARISON:  CT from earlier in the same day. FINDINGS: Brain: No evidence of acute infarction, hemorrhage, hydrocephalus, extra-axial collection or mass lesion/mass effect. Chronic atrophic and white matter ischemic changes are noted. No acute hemorrhage or acute infarction is seen. Vascular: No hyperdense vessel or unexpected calcification. Skull: Normal. Negative for fracture or focal lesion. Sinuses/Orbits: No acute finding. Other: None. IMPRESSION: Chronic atrophic and ischemic changes. No acute abnormality is noted. No significant change from the prior exam is seen. Electronically Signed   By: Inez Catalina M.D.   On: 11/27/2022 23:56   DG Chest Port 1 View  Result Date: 11/27/2022 CLINICAL DATA:  Altered mental status, confusion EXAM: PORTABLE CHEST 1 VIEW COMPARISON:  04/12/2022 FINDINGS: Atherosclerotic calcification of the aortic arch. Thoracic spondylosis noted. Heart size within normal limits. The lungs appear clear. Substantial degenerative glenohumeral arthropathy, left greater than right. No blunting  of the costophrenic angles. IMPRESSION: 1. No active cardiopulmonary disease is radiographically apparent. 2. Thoracic spondylosis. 3. Substantial degenerative glenohumeral arthropathy, left greater than right. Electronically Signed   By: Van Clines M.D.   On: 11/27/2022 18:57   CT Head Wo Contrast  Result Date: 11/27/2022 CLINICAL DATA:  Mental status change of unknown cause.  Confusion. EXAM: CT HEAD WITHOUT CONTRAST TECHNIQUE: Contiguous axial images were obtained from the base of the skull through the vertex without intravenous contrast. RADIATION DOSE  REDUCTION: This exam was performed according to the departmental dose-optimization program which includes automated exposure control, adjustment of the mA and/or kV according to patient size and/or use of iterative reconstruction technique. COMPARISON:  11/07/2022 FINDINGS: Brain: No change. No acute finding. Age related volume loss and mild chronic small-vessel change of the white matter. No large vessel infarction. No mass, hemorrhage, hydrocephalus or extra-axial collection. Vascular: There is atherosclerotic calcification of the major vessels at the base of the brain. Skull: Normal Sinuses/Orbits: Clear/normal Other: None IMPRESSION: No acute or reversible finding. Age related volume loss and mild chronic small-vessel change of the white matter. Electronically Signed   By: [place] Chimes M.D.   On: 11/27/2022 18:56    Scheduled Meds:  aspirin EC  81 mg Oral Daily   atorvastatin  40 mg Oral Daily   enoxaparin (LOVENOX) injection  30 mg Subcutaneous Q24H   insulin aspart  0-9 Units Subcutaneous Q4H   sodium chloride flush  3 mL Intravenous Q12H   Continuous Infusions:   LOS: 0 days   Time spent: 66mn  Sindee Stucker C Delfin Squillace, DO [facility]  If 7PM-7AM, please contact night-coverage [email or web address]  11/29/2022, 7:36 AM

## 2022-11-29 NOTE — Evaluation (Signed)
Clinical/Bedside Swallow Evaluation Patient Details  Name: Kiara Blair MRN: [id number] Date of Birth: 09/29/1930  Today's Date: 11/29/2022 Time: SLP Start Time (ACUTE ONLY): 1032 SLP Stop Time (ACUTE ONLY): 1047 SLP Time Calculation (min) (ACUTE ONLY): 15 min  Past Medical History:  Past Medical History:  Diagnosis Date   Diabetes mellitus without complication ([place])    Hypercholesteremia    Hypertension    TIA (transient ischemic attack) 01/03/2021   Tremor    right arm   Past Surgical History:  Past Surgical History:  Procedure Laterality Date   ABDOMINAL HYSTERECTOMY     APPENDECTOMY     BACK SURGERY     BLADDER SUSPENSION     CHOLECYSTECTOMY     EYE SURGERY     TONSILLECTOMY     HPI:  Pt is a 89 y.o. female who presented with altered status and weakness. Pt passed the [facility] 2/19 and failed it on 2/20 due to coughing. CT head negative. PMH: stroke, diabetes, hypertension, hyperlipidemia, neuropathy, tremor, GERD, anxiety, cataract, hearing loss, paresthesia.    Assessment / Plan / Recommendation  Clinical Impression  Pt was seen for bedside swallow evaluation and she denied a history of dysphagia. Oral mechanism exam was [facility]. Dentition was limited with only maxillary dentures. She tolerated all solids and liquids without signs or symptoms of oropharyngeal dysphagia. A regular texture diet with thin liquids is recommended at this time and SLP will follow briefly to ensure tolerance. SLP Visit Diagnosis: Dysphagia, unspecified (R13.10)    Aspiration Risk  No limitations    Diet Recommendation Regular;Thin liquid   Liquid Administration via: Cup;Straw Medication Administration: Whole meds with liquid; or with puree as tolerated Supervision: Patient able to self feed Compensations: Slow rate;Small sips/bites Postural Changes: Seated upright at 90 degrees    Other  Recommendations Oral Care Recommendations: Oral care BID    Recommendations for follow up therapy are one  component of a multi-disciplinary discharge planning process, led by the attending physician.  Recommendations may be updated based on patient status, additional functional criteria and insurance authorization.  Follow up Recommendations  (TBD)      Assistance Recommended at Discharge    Functional Status Assessment Patient has not had a recent decline in their functional status  Frequency and Duration min 2x/week  2 weeks       Prognosis        Swallow Study   General Date of Onset: 11/28/22 HPI: Pt is a 89 y.o. female who presented with altered status and weakness. Pt passed the [facility] 2/19 and failed it on 2/20 due to coughing. CT head negative. PMH: stroke, diabetes, hypertension, hyperlipidemia, neuropathy, tremor, GERD, anxiety, cataract, hearing loss, paresthesia. Type of Study: Bedside Swallow Evaluation Previous Swallow Assessment: none Diet Prior to this Study: NPO Temperature Spikes Noted: No Respiratory Status: Room air History of Recent Intubation: No Behavior/Cognition: Alert;Cooperative;Pleasant mood Oral Cavity Assessment: Within Functional Limits Oral Care Completed by SLP: No Oral Cavity - Dentition: Adequate natural dentition;Missing dentition Vision: Functional for self-feeding Self-Feeding Abilities: Needs assist Patient Positioning: Upright in bed;Postural control adequate for testing Baseline Vocal Quality: Normal Volitional Swallow: Able to elicit    Oral/Motor/Sensory Function Overall Oral Motor/Sensory Function: Within functional limits   Ice Chips Ice chips: Within functional limits Presentation: Spoon   Thin Liquid Thin Liquid: Within functional limits Presentation: Straw;Cup    Nectar Thick Nectar Thick Liquid: Not tested   Honey Thick Honey Thick Liquid: Not tested   Puree Puree: Within functional  limits Presentation: Spoon   Solid     Solid: Within functional limits Presentation: Self Fed     Nathen Balaban I. Hardin Negus, [place], [place] Office number [phone]  Horton Marshall 11/29/2022,10:49 AM

## 2022-11-30 DIAGNOSIS — R2990 Unspecified symptoms and signs involving the nervous system: Secondary | ICD-10-CM | POA: Diagnosis not present

## 2022-11-30 LAB — CBC
HCT: 36.5 % (ref 36.0–46.0)
Hemoglobin: 12.5 g/dL (ref 12.0–15.0)
MCH: 30 pg (ref 26.0–34.0)
MCHC: 34.2 g/dL (ref 30.0–36.0)
MCV: 87.5 fL (ref 80.0–100.0)
Platelets: 228 10*3/uL (ref 150–400)
RBC: 4.17 MIL/uL (ref 3.87–5.11)
RDW: 14.2 % (ref 11.5–15.5)
WBC: 6.8 10*3/uL (ref 4.0–10.5)
nRBC: 0 % (ref 0.0–0.2)

## 2022-11-30 LAB — BASIC METABOLIC PANEL
Anion gap: 7 (ref 5–15)
BUN: 22 mg/dL (ref 8–23)
CO2: 26 mmol/L (ref 22–32)
Calcium: 9.3 mg/dL (ref 8.9–10.3)
Chloride: 99 mmol/L (ref 98–111)
Creatinine, Ser: 0.83 mg/dL (ref 0.44–1.00)
GFR, Estimated: >60 mL/min (ref >60)
Glucose, Bld: 254 mg/dL — ABNORMAL HIGH (ref 70–99)
Potassium: 4.1 mmol/L (ref 3.5–5.1)
Sodium: 132 mmol/L — ABNORMAL LOW (ref 135–145)

## 2022-11-30 LAB — URINALYSIS, W/ REFLEX TO CULTURE (INFECTION SUSPECTED)
Bilirubin Urine: NEGATIVE
Glucose, UA: NEGATIVE mg/dL
Hgb urine dipstick: NEGATIVE
Ketones, ur: NEGATIVE mg/dL
Nitrite: NEGATIVE
Protein, ur: NEGATIVE mg/dL
Specific Gravity, Urine: 1.015 (ref 1.005–1.030)
pH: 5.5 (ref 5.0–8.0)

## 2022-11-30 LAB — GLUCOSE, CAPILLARY
Glucose-Capillary: 261 mg/dL — ABNORMAL HIGH (ref 70–99)
Glucose-Capillary: 333 mg/dL — ABNORMAL HIGH (ref 70–99)
Glucose-Capillary: 200 mg/dL — ABNORMAL HIGH (ref 70–99)
Glucose-Capillary: 165 mg/dL — ABNORMAL HIGH (ref 70–99)

## 2022-11-30 MED ORDER — METFORMIN HCL 500 MG PO TABS
1000.0000 mg | ORAL_TABLET | Freq: Two times a day (BID) | ORAL | Status: DC
  Administered 2022-11-30 – 2022-12-04 (×8): 1000 mg via ORAL
  Filled 2022-11-30 (×8): qty 2

## 2022-11-30 NOTE — TOC Progression Note (Signed)
Transition of Care [facility]) - Progression Note    Patient Details  Name: Kiara Blair MRN: [id number] Date of Birth: [birth year]  [facility] CM/SW [place], [place] Phone Number: 11/30/2022, 3:02 PM  Clinical Narrative:   CSW notified that family is interested in seeing if patient can admit directly into ALF. CSW sent medical records to [facility] for review, awaiting response. CSW to follow.    Expected Discharge Plan: Assisted Living Barriers to Discharge: Continued Medical Work up  Expected Discharge Plan and Services     Post Acute Care Choice: [place] Living arrangements for the past 2 months: Single Family Home                                       Social Determinants of Health (SDOH) Interventions SDOH Screenings   Food Insecurity: No Food Insecurity (11/28/2022)  Housing: Low Risk  (11/28/2022)  Transportation Needs: No Transportation Needs (11/28/2022)  Utilities: Not At Risk (11/28/2022)  Tobacco Use: Low Risk  (11/28/2022)    Readmission Risk Interventions     No data to display

## 2022-11-30 NOTE — Progress Notes (Signed)
Mobility Specialist: Progress Note   11/30/22 1739  Mobility  Activity Ambulated with assistance in hallway  Level of Assistance Contact guard assist, steadying assist  Assistive Device Front wheel walker  Distance Ambulated (ft) 350 ft  Activity Response Tolerated well  Mobility Referral Yes  $Mobility charge 1 Mobility   Received pt in bed having no complaints and agreeable to mobility. To BR then hallway ambulation. Pt was asymptomatic throughout ambulation and returned to room w/o fault. Left in bed w/ call bell in reach and all needs met.  Kiara Blair Mobility Specialist Please contact via SecureChat or Rehab office at [phone]

## 2022-11-30 NOTE — Inpatient Diabetes Management (Signed)
Inpatient Diabetes Program Recommendations  AACE/ADA: New Consensus Statement on Inpatient Glycemic Control (2015)  Target Ranges:  Prepandial:   less than 140 mg/dL      Peak postprandial:   less than 180 mg/dL (1-2 hours)      Critically ill patients:  140 - 180 mg/dL   Lab Results  Component Value Date   GLUCAP 261 (H) 11/30/2022   HGBA1C 8.7 (H) 11/29/2022    Review of Glycemic Control  Latest Reference Range & Units 11/29/22 08:02 11/29/22 12:19 11/29/22 16:10 11/29/22 20:47 11/30/22 06:12  Glucose-Capillary 70 - 99 mg/dL 171 (H) 266 (H) 260 (H) 300 (H) 261 (H)   Diabetes history: DM 2 Outpatient Diabetes medications: Alogliptin-metformin (KAZANO) 12.02-999 MG BID  Current orders for Inpatient glycemic control:  Novolog 0-9 units tid + hs  Inpatient Diabetes Program Recommendations:    Glucose trends have increased especially after po intake  -   May consider Novolog 2 units tid meal coverage if eating >50% of meals.  Thanks,  Tama Headings RN, MSN, BC-ADM Inpatient Diabetes Coordinator Team Pager [phone] (8a-5p)

## 2022-11-30 NOTE — Progress Notes (Signed)
Received a call from [employer] in lab at [facility] who called to report updated lab results in [facility] from 11-27-22. Updates are reflected in chart.

## 2022-11-30 NOTE — Progress Notes (Signed)
PROGRESS NOTE    Sanyi Board  [id number] DOB: [birth year] DOA: 11/27/2022 PCP: Rich Fuchs, PA   Brief Narrative:  Goddess Derr is a 89 y.o. female with medical history significant of stroke, diabetes, hypertension, hyperlipidemia, neuropathy, tremor, GERD, anxiety, cataract, hearing loss, paresthesia presenting with altered mental status and weakness. History of slurred speech and L sided paralysis previously - questionable Right sided weakness and dysphagia concerning for stroke - hospitalist called for admission.  Assessment & Plan:   Principal Problem:   Stroke-like symptoms Active Problems:   Essential hypertension   Diabetes mellitus type 2 in nonobese (HCC)   History of CVA (cerebrovascular accident)   Diabetic polyneuropathy associated with type 2 diabetes mellitus (HCC)   Gastroesophageal reflux disease   Generalized anxiety disorder   Hyperlipidemia LDL goal [id number]   Acute metabolic encephalopathy   Acute metabolic encephalopathy, resolving UTI, POA - Ceftriaxone x3 days (last dose 12/01/2022) - Symptoms confirmed at bedside with patient, limited history but admits to increased frequency/hesitancy/urgency with urination this week consistent with her typical UTI symptoms concurrent with metabolic encephalopathy -Patient does not meet sepsis criteria  Stroke-like symptoms, cannot rule out acute CVA, POA History of prior stroke -Transient episode of unilateral weakness on the right, baseline weakness on the left from prior stroke -Initial CT negative, MRI unable to be obtained due to patient's intolerance and claustrophobia - attempted repeat CT but this too was unable to be performed due to patient's claustrophobia(despite having one just the day prior). -Neurology consulted for additional insight and recommendations.  Appreciate their insight and recommendations. -Echocardiogram EF 60 to 65% with normal left ventricular function without regional wall abnormalities but  mild concentric hypertrophy, grade 1 diastolic dysfunction noted.  Valves without any overt vegetation or abnormalities other than mild calcification of the aortic valve. - SLP eval passed - continue to advance diet as tolerated - PT/OT following, current recommendation for SNF given ongoing ambulatory dysfunction and fall risk -A1c elevated 8.7  Uncontrolled diabetes with hyperglycemia -SSI ongoing -Resume home kazano 12.02/999 BID at discharge(Continue metformin inpatient for now) -A1c 8.7 - would prefer elevated glucose in this age group over tightly controlled A1c for risk of symptoms/hypoglycemia   Hypertension - Holding home antihypertensives for permissive HTN   Hyperlipidemia - Continue home atorvastatin   Neuropathy Hearing loss History of paresthesias - Noted to be at baseline, no indication for further testing  DVT prophylaxis: Lovenox Code Status: Full Family Communication: Son at bedside  Status is: Inpatient  Dispo: The patient is from: Home              Anticipated d/c is to: SNF              Anticipated d/c date is: 48 to 72 hours              Patient currently not medically stable for discharge  Consultants:  Neurology  Procedures:  None  Antimicrobials:  Ceftriaxone x 3 days  Subjective: Unable to tolerate MRI overnight due to claustrophobia, patient's mental status somewhat improving from prior but not yet back to baseline, admits to symptoms of urinary frequency hesitancy and urgency as well as dysuria.  Objective: Vitals:   11/29/22 1143 11/29/22 1531 11/29/22 1954 11/30/22 0400  BP: (!) 139/51 (!) 147/63 (!) 141/63 (!) 176/59  Pulse: 78 69 65 61  Resp: 16 16 16 18  $ Temp: 98.7 F (37.1 C) 98.8 F (37.1 C) 98.6 F (37 C) 97.8 F (36.6 C)  TempSrc:  Oral Oral Oral Oral  SpO2: 95% 95% 97% 97%  Weight:      Height:        Intake/Output Summary (Last 24 hours) at 11/30/2022 0739 Last data filed at 11/30/2022 0400 Gross per 24 hour  Intake  100 ml  Output 3 ml  Net 97 ml    Filed Weights   11/27/22 1630  Weight: 46.3 kg    Examination:  General:  Pleasantly resting in bed, No acute distress.  Alert to person and general situation only. HEENT:  Normocephalic atraumatic.  Sclerae nonicteric, noninjected.  Extraocular movements intact bilaterally. Neck:  Without mass or deformity.  Trachea is midline. Lungs:  Clear to auscultate bilaterally without rhonchi, wheeze, or rales. Heart:  Regular rate and rhythm.  Without murmurs, rubs, or gallops. Abdomen:  Soft, nontender, nondistended.  Without guarding or rebound. Extremities: Without cyanosis, clubbing, edema, or obvious deformity. Skin:  Warm and dry, no erythema, no rashes   Data Reviewed: I have personally reviewed following labs and imaging studies  CBC: Recent Labs  Lab 11/27/22 1756 11/28/22 1355 11/29/22 0759 11/30/22 0511  WBC 7.2 6.2 6.2 6.8  NEUTROABS 4.5  --   --   --   HGB 11.2* 12.8 12.8 12.5  HCT 33.3* 37.9 39.5 36.5  MCV 88.1 86.7 89.4 87.5  PLT 232 242 244 [id number]    Basic Metabolic Panel: Recent Labs  Lab 11/27/22 1756 11/28/22 1355 11/29/22 0759 11/30/22 0511  NA 130* 135 136 132*  K 4.0 3.7 3.7 4.1  CL 98 101 102 99  CO2 24 25 22 26  $ GLUCOSE 163* 220* 170* 254*  BUN 30* 16 21 22  $ CREATININE 0.90 0.66 0.79 0.83  CALCIUM 8.8* 9.2 9.3 9.3  MG  --  1.4*  --   --     GFR: Estimated Creatinine Clearance: 32.9 mL/min (by C-G formula based on SCr of 0.83 mg/dL). Liver Function Tests: Recent Labs  Lab 11/27/22 1756 11/28/22 1355 11/29/22 0759  AST 23 22 21  $ ALT 20 18 17  $ ALKPHOS 94 70 64  BILITOT 0.6 0.8 1.2  PROT 6.1* 5.8* 5.7*  ALBUMIN 3.5 3.4* 3.3*    No results for input(s): "LIPASE", "AMYLASE" in the last 168 hours. No results for input(s): "AMMONIA" in the last 168 hours. Coagulation Profile: No results for input(s): "INR", "PROTIME" in the last 168 hours. Cardiac Enzymes: No results for input(s): "CKTOTAL", "CKMB",  "CKMBINDEX", "TROPONINI" in the last 168 hours. BNP (last 3 results) No results for input(s): "PROBNP" in the last 8760 hours. HbA1C: Recent Labs    11/29/22 0759  HGBA1C 8.7*   CBG: Recent Labs  Lab 11/29/22 0802 11/29/22 1219 11/29/22 1610 11/29/22 2047 11/30/22 0612  GLUCAP 171* 266* 260* 300* 261*    Lipid Profile: Recent Labs    11/29/22 0759  CHOL 167  HDL 81  LDLCALC 69  TRIG 87  CHOLHDL 2.1   Thyroid Function Tests: Recent Labs    11/28/22 1355  TSH 1.882    Anemia Panel: No results for input(s): "VITAMINB12", "FOLATE", "FERRITIN", "TIBC", "IRON", "RETICCTPCT" in the last 72 hours. Sepsis Labs: No results for input(s): "PROCALCITON", "LATICACIDVEN" in the last 168 hours.  Recent Results (from the past 240 hour(s))  Resp panel by RT-PCR (RSV, Flu A&B, Covid) Anterior Nasal Swab     Status: None   Collection Time: 11/27/22  6:20 PM   Specimen: Anterior Nasal Swab  Result Value Ref Range Status   SARS Coronavirus 2  by RT PCR NEGATIVE NEGATIVE Final    Comment: (NOTE) SARS-CoV-2 target nucleic acids are NOT DETECTED.  The SARS-CoV-2 RNA is generally detectable in upper respiratory specimens during the acute phase of infection. The lowest concentration of SARS-CoV-2 viral copies this assay can detect is 138 copies/mL. A negative result does not preclude SARS-Cov-2 infection and should not be used as the sole basis for treatment or other patient management decisions. A negative result may occur with  improper specimen collection/handling, submission of specimen other than nasopharyngeal swab, presence of viral mutation(s) within the areas targeted by this assay, and inadequate number of viral copies(<138 copies/mL). A negative result must be combined with clinical observations, patient history, and epidemiological information. The expected result is Negative.  Fact Sheet for Patients:  EntrepreneurPulse.com.au  Fact Sheet for  Healthcare Providers:  IncredibleEmployment.be  This test is no t yet approved or cleared by the [place] FDA and  has been authorized for detection and/or diagnosis of SARS-CoV-2 by FDA under an Emergency Use Authorization (EUA). This EUA will remain  in effect (meaning this test can be used) for the duration of the COVID-19 declaration under Section 564(b)(1) of the Act, 21 U.S.C.section 360bbb-3(b)(1), unless the authorization is terminated  or revoked sooner.       Influenza A by PCR NEGATIVE NEGATIVE Final   Influenza B by PCR NEGATIVE NEGATIVE Final    Comment: (NOTE) The Xpert Xpress SARS-CoV-2/FLU/RSV plus assay is intended as an aid in the diagnosis of influenza from Nasopharyngeal swab specimens and should not be used as a sole basis for treatment. Nasal washings and aspirates are unacceptable for Xpert Xpress SARS-CoV-2/FLU/RSV testing.  Fact Sheet for Patients: EntrepreneurPulse.com.au  Fact Sheet for Healthcare Providers: IncredibleEmployment.be  This test is not yet approved or cleared by the [place] FDA and has been authorized for detection and/or diagnosis of SARS-CoV-2 by FDA under an Emergency Use Authorization (EUA). This EUA will remain in effect (meaning this test can be used) for the duration of the COVID-19 declaration under Section 564(b)(1) of the Act, 21 U.S.C. section 360bbb-3(b)(1), unless the authorization is terminated or revoked.     Resp Syncytial Virus by PCR NEGATIVE NEGATIVE Final    Comment: (NOTE) Fact Sheet for Patients: EntrepreneurPulse.com.au  Fact Sheet for Healthcare Providers: IncredibleEmployment.be  This test is not yet approved or cleared by the [place] FDA and has been authorized for detection and/or diagnosis of SARS-CoV-2 by FDA under an Emergency Use Authorization (EUA). This EUA will remain in effect (meaning this  test can be used) for the duration of the COVID-19 declaration under Section 564(b)(1) of the Act, 21 U.S.C. section 360bbb-3(b)(1), unless the authorization is terminated or revoked.  Performed at [facility], [address], [place], [place] [address]          Radiology Studies: EEG adult  Result Date: 12-23-22 Lora Havens, MD     December 23, 2022 12:13 PM Patient Name: Kiara Blair MRN: [id number] Epilepsy Attending: Lora Havens Referring Physician/Provider: Rosalin Hawking, MD Date: 2022-12-23 Duration: 24.35 mins Patient history: 89yo F with ams. EEG to evaluate for seizure Level of alertness: Awake, asleep AEDs during EEG study: None Technical aspects: This EEG study was done with scalp electrodes positioned according to the 10-20 International system of electrode placement. Electrical activity was reviewed with band pass filter of 1-70Hz$ , sensitivity of 7 uV/mm, display speed of 28m/sec with a 60Hz$  notched filter applied as appropriate. EEG data were recorded continuously and  digitally stored.  Video monitoring was available and reviewed as appropriate. Description: The posterior dominant rhythm consists of 8-9 Hz activity of moderate voltage (25-35 uV) seen predominantly in posterior head regions, symmetric and reactive to eye opening and eye closing. Sleep was characterized by vertex waves, sleep spindles (12 to 14 Hz), maximal frontocentral region. Physiologic photic driving was not seen during photic stimulation.  Hyperventilation was not performed.   IMPRESSION: This study is within normal limits. No seizures or epileptiform discharges were seen throughout the recording. Lora Havens   ECHOCARDIOGRAM COMPLETE  Result Date: 11/29/2022    ECHOCARDIOGRAM REPORT   Patient Name:   Praise Friebel Date of Exam: 11/29/2022 Medical Rec #:  [id number]    Height:       61.0 in Accession #:    [id number]   Weight:       102.0 lb Date of Birth:  August 04, 1930     BSA:          1.419 m  Patient Age:    64 years     BP:           133/49 mmHg Patient Gender: F            HR:           75 bpm. Exam Location:  Inpatient Procedure: 2D Echo Indications:    TIA  History:        Patient has prior history of Echocardiogram examinations, most                 recent 01/04/2021. Stroke; Risk Factors:Diabetes, Hypertension                 and Dyslipidemia.  Sonographer:    Harvie Junior Referring Phys: [id number] Candace Gallus MELVIN  Sonographer Comments: Technically difficult study due to poor echo windows. Positioning. IMPRESSIONS  1. Mild intracavitary gradient. Peak velocity 0.73 m/s. Peak gradient 2.1 mmHg. Left ventricular ejection fraction, by estimation, is 60 to 65%. Left ventricular ejection fraction by PLAX is 64 %. The left ventricle has normal function. The left ventricle has no regional wall motion abnormalities. There is mild concentric left ventricular hypertrophy. Left ventricular diastolic parameters are consistent with Grade I diastolic dysfunction (impaired relaxation).  2. Right ventricular systolic function is normal. The right ventricular size is normal. There is normal pulmonary artery systolic pressure.  3. A small pericardial effusion is present. The pericardial effusion is anterior to the right ventricle. There is no evidence of cardiac tamponade.  4. The mitral valve is normal in structure. No evidence of mitral valve regurgitation. No evidence of mitral stenosis.  5. The aortic valve is tricuspid. There is mild calcification of the aortic valve. There is mild thickening of the aortic valve. Aortic valve regurgitation is mild. No aortic stenosis is present. Aortic regurgitation PHT measures 441 msec.  6. The inferior vena cava is normal in size with greater than 50% respiratory variability, suggesting right atrial pressure of 3 mmHg. FINDINGS  Left Ventricle: Mild intracavitary gradient. Peak velocity 0.73 m/s. Peak gradient 2.1 mmHg. Left ventricular ejection fraction, by estimation, is  60 to 65%. Left ventricular ejection fraction by PLAX is 64 %. The left ventricle has normal function. The  left ventricle has no regional wall motion abnormalities. The left ventricular internal cavity size was normal in size. There is mild concentric left ventricular hypertrophy. Left ventricular diastolic parameters are consistent with Grade I diastolic dysfunction (impaired relaxation). Indeterminate filling pressures. Right Ventricle: The  right ventricular size is normal. No increase in right ventricular wall thickness. Right ventricular systolic function is normal. There is normal pulmonary artery systolic pressure. The tricuspid regurgitant velocity is 1.88 m/s, and  with an assumed right atrial pressure of 3 mmHg, the estimated right ventricular systolic pressure is 0000000 mmHg. Left Atrium: Left atrial size was normal in size. Right Atrium: Right atrial size was normal in size. Pericardium: A small pericardial effusion is present. The pericardial effusion is anterior to the right ventricle. There is no evidence of cardiac tamponade. Mitral Valve: The mitral valve is normal in structure. No evidence of mitral valve regurgitation. No evidence of mitral valve stenosis. Tricuspid Valve: The tricuspid valve is normal in structure. Tricuspid valve regurgitation is trivial. No evidence of tricuspid stenosis. Aortic Valve: The aortic valve is tricuspid. There is mild calcification of the aortic valve. There is mild thickening of the aortic valve. Aortic valve regurgitation is mild. Aortic regurgitation PHT measures 441 msec. No aortic stenosis is present. Aortic valve mean gradient measures 2.0 mmHg. Aortic valve peak gradient measures 3.4 mmHg. Aortic valve area, by VTI measures 1.57 cm. Pulmonic Valve: The pulmonic valve was normal in structure. Pulmonic valve regurgitation is not visualized. No evidence of pulmonic stenosis. Aorta: The aortic root is normal in size and structure. Venous: The inferior vena cava is  normal in size with greater than 50% respiratory variability, suggesting right atrial pressure of 3 mmHg. IAS/Shunts: No atrial level shunt detected by color flow Doppler.  LEFT VENTRICLE PLAX 2D LV EF:         Left            Diastology                ventricular     LV e' medial:    3.70 cm/s                ejection        LV E/e' medial:  12.2                fraction by     LV e' lateral:   4.90 cm/s                PLAX is 64      LV E/e' lateral: 9.2                %. LVIDd:         3.80 cm LVIDs:         2.50 cm LV PW:         1.30 cm LV IVS:        1.25 cm LVOT diam:     1.70 cm LV SV:         29 LV SV Index:   20 LVOT Area:     2.27 cm  LV Volumes (MOD) LV vol d, MOD    44.8 ml A2C: LV vol d, MOD    76.6 ml A4C: LV vol s, MOD    20.4 ml A2C: LV vol s, MOD    28.4 ml A4C: LV SV MOD A2C:   24.4 ml LV SV MOD A4C:   76.6 ml LV SV MOD BP:    38.6 ml RIGHT VENTRICLE RV Basal diam:  3.20 cm RV Mid diam:    2.90 cm RV S prime:     14.80 cm/s TAPSE (M-mode): 1.6 cm LEFT ATRIUM  Index        RIGHT ATRIUM           Index LA diam:        3.00 cm 2.11 cm/m   RA Area:     10.40 cm LA Vol (A2C):   20.6 ml 14.52 ml/m  RA Volume:   24.00 ml  16.92 ml/m LA Vol (A4C):   33.4 ml 23.54 ml/m LA Biplane Vol: 27.5 ml 19.38 ml/m  AORTIC VALVE                    PULMONIC VALVE AV Area (Vmax):    1.47 cm     PV Vmax:       0.90 m/s AV Area (Vmean):   1.37 cm     PV Peak grad:  3.2 mmHg AV Area (VTI):     1.57 cm AV Vmax:           92.60 cm/s AV Vmean:          65.100 cm/s AV VTI:            0.184 m AV Peak Grad:      3.4 mmHg AV Mean Grad:      2.0 mmHg LVOT Vmax:         59.90 cm/s LVOT Vmean:        39.400 cm/s LVOT VTI:          0.128 m LVOT/AV VTI ratio: 0.69 AI PHT:            441 msec  AORTA Ao Root diam: 2.80 cm Ao Asc diam:  3.00 cm MITRAL VALVE               TRICUSPID VALVE MV Area (PHT): 3.53 cm    TR Peak grad:   14.1 mmHg MV Decel Time: 215 msec    TR Vmax:        188.00 cm/s MR Peak grad: 14.1 mmHg MR  Vmax:      188.00 cm/s  SHUNTS MV E velocity: 45.10 cm/s  Systemic VTI:  0.13 m MV A velocity: 91.70 cm/s  Systemic Diam: 1.70 cm MV E/A ratio:  0.49 Skeet Latch MD Electronically signed by Skeet Latch MD Signature Date/Time: 11/29/2022/10:21:46 AM    Final     Scheduled Meds:  aspirin EC  81 mg Oral Daily   atorvastatin  40 mg Oral Daily   enoxaparin (LOVENOX) injection  30 mg Subcutaneous Q24H   insulin aspart  0-5 Units Subcutaneous QHS   insulin aspart  0-9 Units Subcutaneous TID WC   sodium chloride flush  3 mL Intravenous Q12H   Continuous Infusions:  cefTRIAXone (ROCEPHIN)  IV Stopped (11/29/22 2114)     LOS: 1 day   Time spent: 3mn  Kasten Leveque C Nyelah Emmerich, DO [facility]  If 7PM-7AM, please contact night-coverage [email or web address]  11/30/2022, 7:39 AM

## 2022-12-01 DIAGNOSIS — R2990 Unspecified symptoms and signs involving the nervous system: Secondary | ICD-10-CM | POA: Diagnosis not present

## 2022-12-01 LAB — GLUCOSE, CAPILLARY
Glucose-Capillary: 212 mg/dL — ABNORMAL HIGH (ref 70–99)
Glucose-Capillary: 252 mg/dL — ABNORMAL HIGH (ref 70–99)
Glucose-Capillary: 167 mg/dL — ABNORMAL HIGH (ref 70–99)
Glucose-Capillary: 188 mg/dL — ABNORMAL HIGH (ref 70–99)

## 2022-12-01 LAB — BASIC METABOLIC PANEL
Anion gap: 7 (ref 5–15)
BUN: 18 mg/dL (ref 8–23)
CO2: 25 mmol/L (ref 22–32)
Calcium: 9.3 mg/dL (ref 8.9–10.3)
Chloride: 100 mmol/L (ref 98–111)
Creatinine, Ser: 0.69 mg/dL (ref 0.44–1.00)
GFR, Estimated: >60 mL/min (ref >60)
Glucose, Bld: 194 mg/dL — ABNORMAL HIGH (ref 70–99)
Potassium: 4 mmol/L (ref 3.5–5.1)
Sodium: 132 mmol/L — ABNORMAL LOW (ref 135–145)

## 2022-12-01 LAB — CBC
HCT: 37.6 % (ref 36.0–46.0)
Hemoglobin: 12.8 g/dL (ref 12.0–15.0)
MCH: 29.9 pg (ref 26.0–34.0)
MCHC: 34 g/dL (ref 30.0–36.0)
MCV: 87.9 fL (ref 80.0–100.0)
Platelets: 241 10*3/uL (ref 150–400)
RBC: 4.28 MIL/uL (ref 3.87–5.11)
RDW: 14.3 % (ref 11.5–15.5)
WBC: 5.8 10*3/uL (ref 4.0–10.5)
nRBC: 0 % (ref 0.0–0.2)

## 2022-12-01 MED ORDER — HYDROXYZINE HCL 10 MG PO TABS
10.0000 mg | ORAL_TABLET | Freq: Once | ORAL | Status: AC
  Administered 2022-12-01: 10 mg via ORAL
  Filled 2022-12-01: qty 1

## 2022-12-01 NOTE — Progress Notes (Addendum)
SLP Cancellation Note  Patient Details Name: Kiara Blair MRN: [id number] DOB: 05-12-1930   Cancelled treatment:       Reason Eval/Treat Not Completed: Patient at procedure or test/unavailable (Pt currently working with PT. SLP will follow up later as schedule allows.)  Kiara Blair, [name], [place] Office number [phone]  Kiara Blair 12/01/2022, 11:19 AM

## 2022-12-01 NOTE — Progress Notes (Signed)
Occupational Therapy Treatment Patient Details Name: Kiara Blair MRN: [id number] DOB: Dec 01, 1929 Today's Date: 12/01/2022   History of present illness 89 y.o. female presents to [facility] hospital on 11/27/2022 with AMS and weakness. In ED pt with R weakness and difficulty eating. PMH: DM2, HTN, RUE tremor, CVA.   OT comments  Patient continues to make steady progress towards goals in skilled OT session. Patient's session encompassed  pathfinding, upper level cognition, and functional mobility. Patient stating its November or December of 1955, patient is aware she is in the hospital, but then speaks that she lives with her mother and father still, needs cues for pathinfinding throughout. Patient min A for functional mobility. OT is aware that sons are requesting direct admit to ALF, however if unable, OT recommendation remains SNF unless 24/7 care is available. OT will continue to follow.    Recommendations for follow up therapy are one component of a multi-disciplinary discharge planning process, led by the attending physician.  Recommendations may be updated based on patient status, additional functional criteria and insurance authorization.    Follow Up Recommendations  Skilled nursing-short term rehab (<3 hours/day)     Assistance Recommended at Discharge Frequent or constant Supervision/Assistance  Patient can return home with the following  A little help with walking and/or transfers;A lot of help with bathing/dressing/bathroom;Assistance with cooking/housework;Direct supervision/assist for medications management;Direct supervision/assist for financial management;Assist for transportation;Help with stairs or ramp for entrance   Equipment Recommendations   (Defer to next venue)    Recommendations for Other Services      Precautions / Restrictions Precautions Precautions: Fall Precaution Comments: essential tremor Restrictions Weight Bearing Restrictions: No       Mobility Bed  Mobility Overal bed mobility: Needs Assistance Bed Mobility: Supine to Sit     Supine to sit: HOB elevated, Min guard     General bed mobility comments: pt able to bring LE's off EOB and raise trunk with use of bed rail.    Transfers Overall transfer level: Needs assistance Equipment used: Rolling walker (2 wheels) Transfers: Sit to/from Stand Sit to Stand: Min assist           General transfer comment: min A from EOB, completing functional mobility in hallway     Balance Overall balance assessment: Needs assistance Sitting-balance support: No upper extremity supported, Feet supported Sitting balance-Leahy Scale: Fair Sitting balance - Comments: pt able to weight shift on toilet to perform pericare while sitting   Standing balance support: Bilateral upper extremity supported, Reliant on assistive device for balance Standing balance-Leahy Scale: Poor                             ADL either performed or assessed with clinical judgement   ADL Overall ADL's : Needs assistance/impaired                         Toilet Transfer: Minimal assistance;Cueing for sequencing;Cueing for safety;Ambulation           Functional mobility during ADLs: Minimal assistance;Cueing for sequencing;Cueing for safety;Rolling walker (2 wheels) General ADL Comments: Session focus on pathfinding, upper level cognition, and functional mobility    Extremity/Trunk Assessment              Vision       Perception     Praxis      Cognition Arousal/Alertness: Awake/alert Behavior During Therapy: WFL for tasks assessed/performed Overall Cognitive Status:  Impaired/Different from baseline Area of Impairment: Safety/judgement, Awareness, Problem solving, Orientation, Attention, Memory, Following commands                 Orientation Level: Place, Time, Situation Current Attention Level: Selective Memory: Decreased recall of precautions, Decreased short-term  memory Following Commands: Follows one step commands with increased time, Follows multi-step commands with increased time Safety/Judgement: Decreased awareness of safety, Decreased awareness of deficits Awareness: Emergent Problem Solving: Slow processing, Requires verbal cues General Comments: Patient stating its November or December of 1955, patient is aware she is in the hospital, but then speaks that she lives with her mother and father still, needs cues for pathinfinding throughout.        Exercises      Shoulder Instructions       General Comments      Pertinent Vitals/ Pain       Pain Assessment Pain Assessment: No/denies pain  Home Living                                          Prior Functioning/Environment              Frequency  Min 2X/week        Progress Toward Goals  OT Goals(current goals can now be found in the care plan section)  Progress towards OT goals: Progressing toward goals  Acute Rehab OT Goals Patient Stated Goal: to get back home OT Goal Formulation: With patient Time For Goal Achievement: 12/13/22 Potential to Achieve Goals: Good  Plan Discharge plan remains appropriate;Frequency remains appropriate    Co-evaluation                 AM-PAC OT "6 Clicks" Daily Activity     Outcome Measure   Help from another person eating meals?: A Lot Help from another person taking care of personal grooming?: A Little Help from another person toileting, which includes using toliet, bedpan, or urinal?: A Little Help from another person bathing (including washing, rinsing, drying)?: A Little Help from another person to put on and taking off regular upper body clothing?: A Little Help from another person to put on and taking off regular lower body clothing?: A Little 6 Click Score: 17    End of Session Equipment Utilized During Treatment: Gait belt;Rolling walker (2 wheels)  OT Visit Diagnosis: Unsteadiness on feet  (R26.81);Other abnormalities of gait and mobility (R26.89);Repeated falls (R29.6);Muscle weakness (generalized) (M62.81);History of falling (Z91.81);Other symptoms and signs involving cognitive function;Adult, failure to thrive (R62.7)   Activity Tolerance Patient tolerated treatment well   Patient Left in bed;with call bell/phone within reach;with bed alarm set;with family/visitor present   Nurse Communication Mobility status        Time: [id number] OT Time Calculation (min): 13 min  Charges: OT General Charges $OT Visit: 1 Visit OT Treatments $Self Care/Home Management : 8-22 mins  Corinne Ports E. Kiley Solimine, OTR/L Acute Rehabilitation Services [phone]   Ascencion Dike 12/01/2022, 2:35 PM

## 2022-12-01 NOTE — Progress Notes (Signed)
Speech Language Pathology Treatment: Dysphagia  Patient Details Name: Kiara Blair MRN: [id number] DOB: 01/05/1930 Today's Date: 12/01/2022 Time: [id number] SLP Time Calculation (min) (ACUTE ONLY): 14 min  Assessment / Plan / Recommendation Clinical Impression  Pt was seen for dysphagia treatment and she was cooperative throughout the session. Pt, nursing, and her son reported that the pt has been tolerating the current diet without significant difficulty. Pt's son stated that she coughed once with a meal today, but that coughing about once per day during a meal is not atypical for her. Pt tolerated regular texture solids, dual consistency boluses (dysphagia 3 with thin liquids), and thin liquids via straw using individual and consecutive swallows without symptoms of oropharyngeal dysphagia. It is recommended that the current diet of regular texture solids and thin liquids be continued. Further skilled SLP services are not clinically indicated at this time.    HPI HPI: Pt is a 89 y.o. female who presented with altered status and weakness. Pt passed the [facility] 2/19 and failed it on 2/20 due to coughing. CT head negative. PMH: stroke, diabetes, hypertension, hyperlipidemia, neuropathy, tremor, GERD, anxiety, cataract, hearing loss, paresthesia.      SLP Plan  All goals met;Discharge SLP treatment due to (comment)      Recommendations for follow up therapy are one component of a multi-disciplinary discharge planning process, led by the attending physician.  Recommendations may be updated based on patient status, additional functional criteria and insurance authorization.    Recommendations  Diet recommendations: Regular;Thin liquid Liquids provided via: Cup;Straw Medication Administration: Whole meds with liquid Supervision: Patient able to self feed Compensations: Slow rate;Small sips/bites Postural Changes and/or Swallow Maneuvers: Seated upright 90 degrees                Oral Care  Recommendations: Oral care BID Follow Up Recommendations:  (TBD) SLP Visit Diagnosis: Dysphagia, unspecified (R13.10) Plan: All goals met;Discharge SLP treatment due to (comment)          Kiara Blair I. Kiara Blair, [place], [place] Office number [phone]  Kiara Blair  12/01/2022, 5:11 PM

## 2022-12-01 NOTE — Progress Notes (Signed)
PROGRESS NOTE    Kiara Blair  [id number] DOB: 1929-11-18 DOA: 11/27/2022 PCP: Rich Fuchs, PA   Brief Narrative:  Kiara Blair is a 89 y.o. female with medical history significant of stroke, diabetes, hypertension, hyperlipidemia, neuropathy, tremor, GERD, anxiety, cataract, hearing loss, paresthesia presenting with altered mental status and weakness. History of slurred speech and L sided paralysis previously - questionable Right sided weakness and dysphagia concerning for stroke - hospitalist called for admission.  Patient medically stable for discharge, awaiting safe disposition at SNF, patient will complete her third midnight tonight thereafter should be discharged to rehab pending bed availability and insurance approval.  Patient has completed her antibiotic course and is essentially back to baseline other than ongoing ambulatory dysfunction weakness and falls per family.  Assessment & Plan:   Principal Problem:   Stroke-like symptoms Active Problems:   Essential hypertension   Diabetes mellitus type 2 in nonobese (HCC)   History of CVA (cerebrovascular accident)   Diabetic polyneuropathy associated with type 2 diabetes mellitus (HCC)   Gastroesophageal reflux disease   Generalized anxiety disorder   Hyperlipidemia LDL goal [id number]   Acute metabolic encephalopathy   Acute metabolic encephalopathy, resolved UTI, POA, resolved - Ceftriaxone x3 days (last dose 12/01/2022) - Symptoms confirmed at bedside with patient, limited history but admits to increased frequency/hesitancy/urgency with urination this week consistent with her typical UTI symptoms concurrent with metabolic encephalopathy -Patient does not meet sepsis criteria  Stroke-like symptoms, cannot rule out acute CVA, POA History of prior stroke -Transient episode of unilateral weakness on the right, baseline weakness on the left from prior stroke -Initial CT negative, MRI unable to be obtained due to patient's  intolerance and claustrophobia - attempted repeat CT but this too was unable to be performed due to patient's claustrophobia(despite having one just the day prior). -Neurology consulted for additional insight and recommendations.  Appreciate their insight and recommendations. -Echocardiogram EF 60 to 65% with normal left ventricular function without regional wall abnormalities but mild concentric hypertrophy, grade 1 diastolic dysfunction noted.  Valves without any overt vegetation or abnormalities other than mild calcification of the aortic valve. - SLP eval passed - continue to advance diet as tolerated - PT/OT following, current recommendation for SNF given ongoing ambulatory dysfunction and fall risk -A1c elevated 8.7  Uncontrolled diabetes with hyperglycemia -SSI ongoing -Resume home kazano 12.02/999 BID at discharge(Continue metformin inpatient for now) -A1c 8.7 - would prefer elevated glucose in this age group over tightly controlled A1c for risk of symptoms/hypoglycemia   Hypertension - Holding home antihypertensives for permissive HTN   Hyperlipidemia - Continue home atorvastatin   Neuropathy Hearing loss History of paresthesias - Noted to be at baseline, no indication for further testing  DVT prophylaxis: Lovenox Code Status: Full Family Communication: Son at bedside  Status is: Inpatient  Dispo: The patient is from: Home              Anticipated d/c is to: SNF              Anticipated d/c date is: Imminent              Patient currently is medically stable for discharge  Consultants:  Neurology  Procedures:  None  Antimicrobials:  Ceftriaxone completed  Subjective: No acute issues or events overnight, appears to be back to baseline ambulating with assistance still unsteady and weak but tolerating p.o well no further episodes of confusion delirium dementia weakness.  Review of systems otherwise negative.  Objective: Vitals:  11/30/22 1525 11/30/22 2035  12/01/22 0300 12/01/22 0725  BP: (!) 132/51 (!) 171/57 (!) 165/63 (!) 171/51  Pulse: 64 65 64 (!) 59  Resp: '19 18 18 18  '$ Temp: 98.1 F (36.7 C) 97.9 F (36.6 C) 97.6 F (36.4 C) 97.9 F (36.6 C)  TempSrc: Oral Oral Oral Oral  SpO2: 96% 95% 98% 99%  Weight:      Height:        Intake/Output Summary (Last 24 hours) at 12/01/2022 0754 Last data filed at 11/30/2022 1108 Gross per 24 hour  Intake 480 ml  Output --  Net 480 ml    Filed Weights   11/27/22 1630  Weight: 46.3 kg    Examination:  General:  Pleasantly resting in bed, No acute distress. Alert to person place and situation. HEENT:  Normocephalic atraumatic.  Sclerae nonicteric, noninjected.  Extraocular movements intact bilaterally. Neck:  Without mass or deformity.  Trachea is midline. Lungs:  Clear to auscultate bilaterally without rhonchi, wheeze, or rales. Heart:  Regular rate and rhythm.  Without murmurs, rubs, or gallops. Abdomen:  Soft, nontender, nondistended.  Without guarding or rebound. Extremities: Without cyanosis, clubbing, edema, or obvious deformity. Skin:  Warm and dry, no erythema, no rashes   Data Reviewed: I have personally reviewed following labs and imaging studies  CBC: Recent Labs  Lab 11/27/22 1756 11/28/22 1355 11/29/22 0759 11/30/22 0511  WBC 7.2 6.2 6.2 6.8  NEUTROABS 4.5  --   --   --   HGB 11.2* 12.8 12.8 12.5  HCT 33.3* 37.9 39.5 36.5  MCV 88.1 86.7 89.4 87.5  PLT 232 242 244 [id number]    Basic Metabolic Panel: Recent Labs  Lab 11/27/22 1756 11/28/22 1355 11/29/22 0759 11/30/22 0511  NA 130* 135 136 132*  K 4.0 3.7 3.7 4.1  CL 98 101 102 99  CO2 '24 25 22 26  '$ GLUCOSE 163* 220* 170* 254*  BUN 30* '16 21 22  '$ CREATININE 0.90 0.66 0.79 0.83  CALCIUM 8.8* 9.2 9.3 9.3  MG  --  1.4*  --   --     GFR: Estimated Creatinine Clearance: 32.9 mL/min (by C-G formula based on SCr of 0.83 mg/dL). Liver Function Tests: Recent Labs  Lab 11/27/22 1756 11/28/22 1355 11/29/22 0759   AST '23 22 21  '$ ALT '20 18 17  '$ ALKPHOS 94 70 64  BILITOT 0.6 0.8 1.2  PROT 6.1* 5.8* 5.7*  ALBUMIN 3.5 3.4* 3.3*    No results for input(s): "LIPASE", "AMYLASE" in the last 168 hours. No results for input(s): "AMMONIA" in the last 168 hours. Coagulation Profile: No results for input(s): "INR", "PROTIME" in the last 168 hours. Cardiac Enzymes: No results for input(s): "CKTOTAL", "CKMB", "CKMBINDEX", "TROPONINI" in the last 168 hours. BNP (last 3 results) No results for input(s): "PROBNP" in the last 8760 hours. HbA1C: Recent Labs    11/29/22 0759  HGBA1C 8.7*    CBG: Recent Labs  Lab 11/30/22 0612 11/30/22 1112 11/30/22 1628 11/30/22 2120 12/01/22 0633  GLUCAP 261* 333* 200* 165* 212*    Lipid Profile: Recent Labs    11/29/22 0759  CHOL 167  HDL 81  LDLCALC 69  TRIG 87  CHOLHDL 2.1    Thyroid Function Tests: Recent Labs    11/28/22 1355  TSH 1.882    Anemia Panel: No results for input(s): "VITAMINB12", "FOLATE", "FERRITIN", "TIBC", "IRON", "RETICCTPCT" in the last 72 hours. Sepsis Labs: No results for input(s): "PROCALCITON", "LATICACIDVEN" in the last 168 hours.  Recent Results (from the past 240 hour(s))  Resp panel by RT-PCR (RSV, Flu A&B, Covid) Anterior Nasal Swab     Status: None   Collection Time: 11/27/22  6:20 PM   Specimen: Anterior Nasal Swab  Result Value Ref Range Status   SARS Coronavirus 2 by RT PCR NEGATIVE NEGATIVE Final    Comment: (NOTE) SARS-CoV-2 target nucleic acids are NOT DETECTED.  The SARS-CoV-2 RNA is generally detectable in upper respiratory specimens during the acute phase of infection. The lowest concentration of SARS-CoV-2 viral copies this assay can detect is 138 copies/mL. A negative result does not preclude SARS-Cov-2 infection and should not be used as the sole basis for treatment or other patient management decisions. A negative result may occur with  improper specimen collection/handling, submission of specimen  other than nasopharyngeal swab, presence of viral mutation(s) within the areas targeted by this assay, and inadequate number of viral copies(<138 copies/mL). A negative result must be combined with clinical observations, patient history, and epidemiological information. The expected result is Negative.  Fact Sheet for Patients:  EntrepreneurPulse.com.au  Fact Sheet for Healthcare Providers:  IncredibleEmployment.be  This test is no t yet approved or cleared by the [place] FDA and  has been authorized for detection and/or diagnosis of SARS-CoV-2 by FDA under an Emergency Use Authorization (EUA). This EUA will remain  in effect (meaning this test can be used) for the duration of the COVID-19 declaration under Section 564(b)(1) of the Act, 21 U.S.C.section 360bbb-3(b)(1), unless the authorization is terminated  or revoked sooner.       Influenza A by PCR NEGATIVE NEGATIVE Final   Influenza B by PCR NEGATIVE NEGATIVE Final    Comment: (NOTE) The Xpert Xpress SARS-CoV-2/FLU/RSV plus assay is intended as an aid in the diagnosis of influenza from Nasopharyngeal swab specimens and should not be used as a sole basis for treatment. Nasal washings and aspirates are unacceptable for Xpert Xpress SARS-CoV-2/FLU/RSV testing.  Fact Sheet for Patients: EntrepreneurPulse.com.au  Fact Sheet for Healthcare Providers: IncredibleEmployment.be  This test is not yet approved or cleared by the [place] FDA and has been authorized for detection and/or diagnosis of SARS-CoV-2 by FDA under an Emergency Use Authorization (EUA). This EUA will remain in effect (meaning this test can be used) for the duration of the COVID-19 declaration under Section 564(b)(1) of the Act, 21 U.S.C. section 360bbb-3(b)(1), unless the authorization is terminated or revoked.     Resp Syncytial Virus by PCR NEGATIVE NEGATIVE Final     Comment: (NOTE) Fact Sheet for Patients: EntrepreneurPulse.com.au  Fact Sheet for Healthcare Providers: IncredibleEmployment.be  This test is not yet approved or cleared by the [place] FDA and has been authorized for detection and/or diagnosis of SARS-CoV-2 by FDA under an Emergency Use Authorization (EUA). This EUA will remain in effect (meaning this test can be used) for the duration of the COVID-19 declaration under Section 564(b)(1) of the Act, 21 U.S.C. section 360bbb-3(b)(1), unless the authorization is terminated or revoked.  Performed at [facility], [address], [place], [place] [address]          Radiology Studies: EEG adult  Result Date: 12-09-2022 Lora Havens, MD     12/09/22 12:13 PM Patient Name: Ayman Fonseca MRN: [id number] Epilepsy Attending: Lora Havens Referring Physician/Provider: Rosalin Hawking, MD Date: December 09, 2022 Duration: 24.35 mins Patient history: 89yo F with ams. EEG to evaluate for seizure Level of alertness: Awake, asleep AEDs during EEG study: None Technical aspects:  This EEG study was done with scalp electrodes positioned according to the 10-20 International system of electrode placement. Electrical activity was reviewed with band pass filter of 1-'70Hz'$ , sensitivity of 7 uV/mm, display speed of 75m/sec with a '60Hz'$  notched filter applied as appropriate. EEG data were recorded continuously and digitally stored.  Video monitoring was available and reviewed as appropriate. Description: The posterior dominant rhythm consists of 8-9 Hz activity of moderate voltage (25-35 uV) seen predominantly in posterior head regions, symmetric and reactive to eye opening and eye closing. Sleep was characterized by vertex waves, sleep spindles (12 to 14 Hz), maximal frontocentral region. Physiologic photic driving was not seen during photic stimulation.  Hyperventilation was not performed.   IMPRESSION: This study is  within normal limits. No seizures or epileptiform discharges were seen throughout the recording. PLora Havens  ECHOCARDIOGRAM COMPLETE  Result Date: 11/29/2022    ECHOCARDIOGRAM REPORT   Patient Name:   Richell Condrey Date of Exam: 11/29/2022 Medical Rec #:  [id number]   Height:       61.0 in Accession #:    [id number]  Weight:       102.0 lb Date of Birth:  [birth year]    BSA:          1.419 m Patient Age:    89years     BP:           133/49 mmHg Patient Gender: F            HR:           75 bpm. Exam Location:  Inpatient Procedure: 2D Echo Indications:    TIA  History:        Patient has prior history of Echocardiogram examinations, most                 recent 01/04/2021. Stroke; Risk Factors:Diabetes, Hypertension                 and Dyslipidemia.  Sonographer:    WHarvie JuniorReferring Phys: [id number] GallusMELVIN  Sonographer Comments: Technically difficult study due to poor echo windows. Positioning. IMPRESSIONS  1. Mild intracavitary gradient. Peak velocity 0.73 m/s. Peak gradient 2.1 mmHg. Left ventricular ejection fraction, by estimation, is 60 to 65%. Left ventricular ejection fraction by PLAX is 64 %. The left ventricle has normal function. The left ventricle has no regional wall motion abnormalities. There is mild concentric left ventricular hypertrophy. Left ventricular diastolic parameters are consistent with Grade I diastolic dysfunction (impaired relaxation).  2. Right ventricular systolic function is normal. The right ventricular size is normal. There is normal pulmonary artery systolic pressure.  3. A small pericardial effusion is present. The pericardial effusion is anterior to the right ventricle. There is no evidence of cardiac tamponade.  4. The mitral valve is normal in structure. No evidence of mitral valve regurgitation. No evidence of mitral stenosis.  5. The aortic valve is tricuspid. There is mild calcification of the aortic valve. There is mild thickening of the aortic valve.  Aortic valve regurgitation is mild. No aortic stenosis is present. Aortic regurgitation PHT measures 441 msec.  6. The inferior vena cava is normal in size with greater than 50% respiratory variability, suggesting right atrial pressure of 3 mmHg. FINDINGS  Left Ventricle: Mild intracavitary gradient. Peak velocity 0.73 m/s. Peak gradient 2.1 mmHg. Left ventricular ejection fraction, by estimation, is 60 to 65%. Left ventricular ejection fraction by PLAX is 64 %. The left  ventricle has normal function. The  left ventricle has no regional wall motion abnormalities. The left ventricular internal cavity size was normal in size. There is mild concentric left ventricular hypertrophy. Left ventricular diastolic parameters are consistent with Grade I diastolic dysfunction (impaired relaxation). Indeterminate filling pressures. Right Ventricle: The right ventricular size is normal. No increase in right ventricular wall thickness. Right ventricular systolic function is normal. There is normal pulmonary artery systolic pressure. The tricuspid regurgitant velocity is 1.88 m/s, and  with an assumed right atrial pressure of 3 mmHg, the estimated right ventricular systolic pressure is 0000000 mmHg. Left Atrium: Left atrial size was normal in size. Right Atrium: Right atrial size was normal in size. Pericardium: A small pericardial effusion is present. The pericardial effusion is anterior to the right ventricle. There is no evidence of cardiac tamponade. Mitral Valve: The mitral valve is normal in structure. No evidence of mitral valve regurgitation. No evidence of mitral valve stenosis. Tricuspid Valve: The tricuspid valve is normal in structure. Tricuspid valve regurgitation is trivial. No evidence of tricuspid stenosis. Aortic Valve: The aortic valve is tricuspid. There is mild calcification of the aortic valve. There is mild thickening of the aortic valve. Aortic valve regurgitation is mild. Aortic regurgitation PHT measures 441  msec. No aortic stenosis is present. Aortic valve mean gradient measures 2.0 mmHg. Aortic valve peak gradient measures 3.4 mmHg. Aortic valve area, by VTI measures 1.57 cm. Pulmonic Valve: The pulmonic valve was normal in structure. Pulmonic valve regurgitation is not visualized. No evidence of pulmonic stenosis. Aorta: The aortic root is normal in size and structure. Venous: The inferior vena cava is normal in size with greater than 50% respiratory variability, suggesting right atrial pressure of 3 mmHg. IAS/Shunts: No atrial level shunt detected by color flow Doppler.  LEFT VENTRICLE PLAX 2D LV EF:         Left            Diastology                ventricular     LV e' medial:    3.70 cm/s                ejection        LV E/e' medial:  12.2                fraction by     LV e' lateral:   4.90 cm/s                PLAX is 64      LV E/e' lateral: 9.2                %. LVIDd:         3.80 cm LVIDs:         2.50 cm LV PW:         1.30 cm LV IVS:        1.25 cm LVOT diam:     1.70 cm LV SV:         29 LV SV Index:   20 LVOT Area:     2.27 cm  LV Volumes (MOD) LV vol d, MOD    44.8 ml A2C: LV vol d, MOD    76.6 ml A4C: LV vol s, MOD    20.4 ml A2C: LV vol s, MOD    28.4 ml A4C: LV SV MOD A2C:   24.4 ml LV SV MOD A4C:   76.6 ml  LV SV MOD BP:    38.6 ml RIGHT VENTRICLE RV Basal diam:  3.20 cm RV Mid diam:    2.90 cm RV S prime:     14.80 cm/s TAPSE (M-mode): 1.6 cm LEFT ATRIUM             Index        RIGHT ATRIUM           Index LA diam:        3.00 cm 2.11 cm/m   RA Area:     10.40 cm LA Vol (A2C):   20.6 ml 14.52 ml/m  RA Volume:   24.00 ml  16.92 ml/m LA Vol (A4C):   33.4 ml 23.54 ml/m LA Biplane Vol: 27.5 ml 19.38 ml/m  AORTIC VALVE                    PULMONIC VALVE AV Area (Vmax):    1.47 cm     PV Vmax:       0.90 m/s AV Area (Vmean):   1.37 cm     PV Peak grad:  3.2 mmHg AV Area (VTI):     1.57 cm AV Vmax:           92.60 cm/s AV Vmean:          65.100 cm/s AV VTI:            0.184 m AV Peak Grad:       3.4 mmHg AV Mean Grad:      2.0 mmHg LVOT Vmax:         59.90 cm/s LVOT Vmean:        39.400 cm/s LVOT VTI:          0.128 m LVOT/AV VTI ratio: 0.69 AI PHT:            441 msec  AORTA Ao Root diam: 2.80 cm Ao Asc diam:  3.00 cm MITRAL VALVE               TRICUSPID VALVE MV Area (PHT): 3.53 cm    TR Peak grad:   14.1 mmHg MV Decel Time: 215 msec    TR Vmax:        188.00 cm/s MR Peak grad: 14.1 mmHg MR Vmax:      188.00 cm/s  SHUNTS MV E velocity: 45.10 cm/s  Systemic VTI:  0.13 m MV A velocity: 91.70 cm/s  Systemic Diam: 1.70 cm MV E/A ratio:  0.49 Skeet Latch MD Electronically signed by Skeet Latch MD Signature Date/Time: 11/29/2022/10:21:46 AM    Final     Scheduled Meds:  aspirin EC  81 mg Oral Daily   atorvastatin  40 mg Oral Daily   enoxaparin (LOVENOX) injection  30 mg Subcutaneous Q24H   insulin aspart  0-5 Units Subcutaneous QHS   insulin aspart  0-9 Units Subcutaneous TID WC   metFORMIN  1,000 mg Oral BID WC   sodium chloride flush  3 mL Intravenous Q12H   Continuous Infusions:  cefTRIAXone (ROCEPHIN)  IV Stopped (11/30/22 2135)     LOS: 2 days   Time spent: 2mn  Shamona Wirtz C Jaelyn Cloninger, DO [facility]  If 7PM-7AM, please contact night-coverage [email or web address]  12/01/2022, 7:54 AM

## 2022-12-01 NOTE — Progress Notes (Signed)
Physical Therapy Treatment Patient Details Name: Kiara Blair MRN: [id number] DOB: [birth year] Today's Date: 12/01/2022   History of Present Illness 89 y.o. female presents to [facility] hospital on 11/27/2022 with AMS and weakness. In ED pt with R weakness and difficulty eating. PMH: DM2, HTN, RUE tremor, CVA.    PT Comments    Patient making good progress with mobility and ambulated ~ 200' with RW after completing personal care tasks in room. Pt ambulated short distance to bathroom and required min assist for sit<>stand from EOB and toilet. Pt was able to stand at sink with single UE support to stabilize balance while brushing hair and teeth. Min assist required for donning underwear with cues and education on benefits of beginning in sitting to reduce fall risk. After ambulation pt requesting to dress in street clothes. Min assist required for LB dressing and mod assist for UB dressing. Continue to recommend SNF rehab with family hopeful that pt can transition to ALF once she regains greater independence. Will continue to progress as able.   Recommendations for follow up therapy are one component of a multi-disciplinary discharge planning process, led by the attending physician.  Recommendations may be updated based on patient status, additional functional criteria and insurance authorization.  Follow Up Recommendations  Skilled nursing-short term rehab (<3 hours/day) Can patient physically be transported by private vehicle: Yes   Assistance Recommended at Discharge Frequent or constant Supervision/Assistance  Patient can return home with the following A little help with walking and/or transfers;A little help with bathing/dressing/bathroom;Assistance with cooking/housework;Direct supervision/assist for medications management;Direct supervision/assist for financial management;Assist for transportation;Help with stairs or ramp for entrance   Equipment Recommendations  BSC/3in1    Recommendations for  Other Services       Precautions / Restrictions Precautions Precautions: Fall Precaution Comments: essential tremor Restrictions Weight Bearing Restrictions: No     Mobility  Bed Mobility Overal bed mobility: Needs Assistance Bed Mobility: Supine to Sit     Supine to sit: HOB elevated, Min guard     General bed mobility comments: pt able to bring LE's off EOB and raise trunk with use of bed rail.    Transfers Overall transfer level: Needs assistance Equipment used: Rolling walker (2 wheels) Transfers: Sit to/from Stand Sit to Stand: Min assist           General transfer comment: pt performed sit<>stand from EOB and toilet, min assist required to complete power up and rise, pt required cues for safe hand placement on RW and use of grab bar in bathroom.    Ambulation/Gait Ambulation/Gait assistance: Min assist, Min guard Gait Distance (Feet): 200 Feet Assistive device: Rolling walker (2 wheels) Gait Pattern/deviations: Step-through pattern, Decreased stride length, Trunk flexed Gait velocity: decr     General Gait Details: overall pt steady and maintained safe position to RW. min guard with intermittent assist to guide walker with turns. no LOB or buckling noted.   Stairs             Wheelchair Mobility    Modified Rankin (Stroke Patients Only)       Balance Overall balance assessment: Needs assistance Sitting-balance support: No upper extremity supported, Feet supported Sitting balance-Leahy Scale: Fair Sitting balance - Comments: pt able to weight shift on toilet to perform pericare while sitting   Standing balance support: Bilateral upper extremity supported, Reliant on assistive device for balance Standing balance-Leahy Scale: Poor Standing balance comment: pt able to pull up LB clothing  Cognition Arousal/Alertness: Awake/alert Behavior During Therapy: WFL for tasks assessed/performed Overall Cognitive  Status: Impaired/Different from baseline Area of Impairment: Safety/judgement, Awareness, Problem solving, Orientation, Attention, Memory, Following commands                 Orientation Level: Place, Time, Situation Current Attention Level: Selective Memory: Decreased recall of precautions, Decreased short-term memory Following Commands: Follows one step commands with increased time, Follows multi-step commands with increased time Safety/Judgement: Decreased awareness of safety, Decreased awareness of deficits Awareness: Emergent Problem Solving: Slow processing, Requires verbal cues General Comments: pt easily redirected and distracted. pt firm on her requests and somewhat stubborn, pt's son present and reports she is much more herself today. pt has awareness of appearance and expressed interest in fixing her hair, putting on real clothes.        Exercises      General Comments        Pertinent Vitals/Pain Pain Assessment Pain Assessment: No/denies pain    Home Living                          Prior Function            PT Goals (current goals can now be found in the care plan section) Acute Rehab PT Goals PT Goal Formulation: With patient/family Time For Goal Achievement: 12/12/22 Potential to Achieve Goals: Fair Progress towards PT goals: Progressing toward goals    Frequency    Min 2X/week      PT Plan Current plan remains appropriate    Co-evaluation              AM-PAC PT "6 Clicks" Mobility   Outcome Measure  Help needed turning from your back to your side while in a flat bed without using bedrails?: A Little Help needed moving from lying on your back to sitting on the side of a flat bed without using bedrails?: A Little Help needed moving to and from a bed to a chair (including a wheelchair)?: A Little Help needed standing up from a chair using your arms (e.g., wheelchair or bedside chair)?: A Little Help needed to walk in  hospital room?: A Little Help needed climbing 3-5 steps with a railing? : A Lot 6 Click Score: 17    End of Session Equipment Utilized During Treatment: Gait belt Activity Tolerance: Patient tolerated treatment well Patient left: in bed;with call bell/phone within reach;with family/visitor present Nurse Communication: Mobility status PT Visit Diagnosis: Other abnormalities of gait and mobility (R26.89);Muscle weakness (generalized) (M62.81);History of falling (Z91.81)     Time: 1100-1136 PT Time Calculation (min) (ACUTE ONLY): 36 min  Charges:  $Gait Training: 8-22 mins $Therapeutic Activity: 8-22 mins                     Verner Mould, DPT Acute Rehabilitation Services Office [phone]  12/01/22 1:09 PM

## 2022-12-01 NOTE — TOC Progression Note (Addendum)
Transition of Care [facility]) - Progression Note    Patient Details  Name: Kiara Blair MRN: [id number] Date of Birth: [birth year]  Transition of Care [facility]) CM/SW [place], [place] Phone Number: 12/01/2022, 2:24 PM  Clinical Narrative:   CSW spoke with Jackelyn Poling at [facility] to confirm receipt of medical records. Per Jackelyn Poling, as patient is different than last time they assessed, they will need to have a clinician come and evaluate the patient. CSW received call from Webb Silversmith, nurse at [facility], that she will do the patient's assessment but cannot come out until 10 am on Monday morning. CSW to follow.  UPDATE 4:36 PM: CSW received call from son, Delfino Lovett, that family would like patient to go to SNF first. Family interested in [facility], if possible, otherwise would like [facility]. CSW sent [facility] a message to ask about bed availability, awaiting response. CSW asking [facility] about bed availability for patient, if able to admit over the weekend or not until Monday, awaiting response. CSW to follow.    Expected Discharge Plan: Assisted Living Barriers to Discharge: Continued Medical Work up  Expected Discharge Plan and Services     Post Acute Care Choice: [place] Living arrangements for the past 2 months: Single Family Home                                       Social Determinants of Health (SDOH) Interventions SDOH Screenings   Food Insecurity: No Food Insecurity (11/28/2022)  Housing: Low Risk  (11/28/2022)  Transportation Needs: No Transportation Needs (11/28/2022)  Utilities: Not At Risk (11/28/2022)  Tobacco Use: Low Risk  (11/28/2022)    Readmission Risk Interventions     No data to display

## 2022-12-02 DIAGNOSIS — R2990 Unspecified symptoms and signs involving the nervous system: Secondary | ICD-10-CM | POA: Diagnosis not present

## 2022-12-02 LAB — GLUCOSE, CAPILLARY
Glucose-Capillary: 168 mg/dL — ABNORMAL HIGH (ref 70–99)
Glucose-Capillary: 262 mg/dL — ABNORMAL HIGH (ref 70–99)
Glucose-Capillary: 157 mg/dL — ABNORMAL HIGH (ref 70–99)
Glucose-Capillary: 206 mg/dL — ABNORMAL HIGH (ref 70–99)

## 2022-12-02 LAB — BASIC METABOLIC PANEL
Anion gap: 10 (ref 5–15)
BUN: 17 mg/dL (ref 8–23)
CO2: 25 mmol/L (ref 22–32)
Calcium: 9.2 mg/dL (ref 8.9–10.3)
Chloride: 98 mmol/L (ref 98–111)
Creatinine, Ser: 0.76 mg/dL (ref 0.44–1.00)
GFR, Estimated: >60 mL/min (ref >60)
Glucose, Bld: 185 mg/dL — ABNORMAL HIGH (ref 70–99)
Potassium: 3.9 mmol/L (ref 3.5–5.1)
Sodium: 133 mmol/L — ABNORMAL LOW (ref 135–145)

## 2022-12-02 LAB — CBC
HCT: 37.5 % (ref 36.0–46.0)
Hemoglobin: 12.7 g/dL (ref 12.0–15.0)
MCH: 29.5 pg (ref 26.0–34.0)
MCHC: 33.9 g/dL (ref 30.0–36.0)
MCV: 87 fL (ref 80.0–100.0)
Platelets: 239 10*3/uL (ref 150–400)
RBC: 4.31 MIL/uL (ref 3.87–5.11)
RDW: 14 % (ref 11.5–15.5)
WBC: 6.1 10*3/uL (ref 4.0–10.5)
nRBC: 0 % (ref 0.0–0.2)

## 2022-12-02 MED ORDER — AMLODIPINE BESYLATE 5 MG PO TABS
5.0000 mg | ORAL_TABLET | Freq: Every day | ORAL | Status: DC
  Administered 2022-12-02 – 2022-12-04 (×3): 5 mg via ORAL
  Filled 2022-12-02 (×3): qty 1

## 2022-12-02 MED ORDER — BENAZEPRIL HCL 20 MG PO TABS
20.0000 mg | ORAL_TABLET | Freq: Every day | ORAL | Status: DC
  Administered 2022-12-02 – 2022-12-04 (×3): 20 mg via ORAL
  Filled 2022-12-02 (×3): qty 1

## 2022-12-02 NOTE — Progress Notes (Signed)
PROGRESS NOTE    Kiara Blair  [id number] DOB: [birth year] DOA: 11/27/2022 PCP: Rich Fuchs, PA   Brief Narrative:  Kiara Blair is a 89 y.o. female with medical history significant of stroke, diabetes, hypertension, hyperlipidemia, neuropathy, tremor, GERD, anxiety, cataract, hearing loss, paresthesia presenting with altered mental status and weakness. History of slurred speech and L sided paralysis previously - questionable Right sided weakness and dysphagia concerning for stroke - hospitalist called for admission.  Patient medically stable for discharge, awaiting safe disposition at SNF vs ALF (family has changed their mind twice now about dispo location), patient has completed her 3rd midnight - remains medically stable for discharge. Patient has completed her antibiotic course and is essentially back to baseline other than ongoing ambulatory dysfunction, weakness, and falls per family.  Assessment & Plan:   Principal Problem:   Stroke-like symptoms Active Problems:   Essential hypertension   Diabetes mellitus type 2 in nonobese (HCC)   History of CVA (cerebrovascular accident)   Diabetic polyneuropathy associated with type 2 diabetes mellitus (HCC)   Gastroesophageal reflux disease   Generalized anxiety disorder   Hyperlipidemia LDL goal [id number]   Acute metabolic encephalopathy   Acute metabolic encephalopathy, resolved UTI, POA, resolved - Ceftriaxone x3 days (last dose 12/01/2022) - Symptoms confirmed at bedside with patient, limited history but admits to increased frequency/hesitancy/urgency with urination this week consistent with her typical UTI symptoms concurrent with metabolic encephalopathy -Patient does not meet sepsis criteria  Stroke-like symptoms, cannot rule out acute CVA, POA History of prior stroke -Transient episode of unilateral weakness on the right, baseline weakness on the left from prior stroke -Initial CT negative, MRI unable to be obtained due to  patient's intolerance and claustrophobia - attempted repeat CT but this too was unable to be performed due to patient's claustrophobia(despite having one just the day prior). -Neurology consulted for additional insight and recommendations.  Appreciate their insight and recommendations. -Echocardiogram EF 60 to 65% with normal left ventricular function without regional wall abnormalities but mild concentric hypertrophy, grade 1 diastolic dysfunction noted.  Valves without any overt vegetation or abnormalities other than mild calcification of the aortic valve. - SLP eval passed - continue to advance diet as tolerated - PT/OT following, current recommendation for SNF given ongoing ambulatory dysfunction and fall risk -A1c elevated 8.7  Uncontrolled diabetes with hyperglycemia -SSI ongoing -Resume home kazano 12.02/999 BID at discharge(Continue metformin inpatient for now) -A1c 8.7 - would prefer elevated glucose in this age group over tightly controlled A1c for risk of symptoms/hypoglycemia   Hypertension - Holding home antihypertensives for permissive HTN   Hyperlipidemia - Continue home atorvastatin   Neuropathy Hearing loss History of paresthesias - Noted to be at baseline, no indication for further testing  DVT prophylaxis: Lovenox Code Status: Full Family Communication: Son at bedside  Status is: Inpatient  Dispo: The patient is from: Home              Anticipated d/c is to: SNF              Anticipated d/c date is: Imminent              Patient currently is medically stable for discharge  Consultants:  Neurology  Procedures:  None  Antimicrobials:  Ceftriaxone completed  Subjective: No acute issues or events overnight, patient in good spirits this morning denies nausea vomiting diarrhea constipation headache fevers chills or chest pain.  She is requesting discharge back home -we discussed her ongoing weakness and  ambulatory dysfunction and that she would need to discuss  with her family for disposition planning/location.  Objective: Vitals:   12/01/22 1928 12/01/22 2051 12/02/22 0326 12/02/22 0724  BP: (!) 179/69 (!) 189/68 (!) 172/67 (!) 165/56  Pulse: 70 66 (!) 57 (!) 53  Resp: '16  16 16  '$ Temp: 98.3 F (36.8 C)  (!) 97.5 F (36.4 C) 98.1 F (36.7 C)  TempSrc: Oral  Oral Oral  SpO2: 99%  98% 100%  Weight:      Height:       No intake or output data in the 24 hours ending 12/02/22 0747  Filed Weights   11/27/22 1630  Weight: 46.3 kg    Examination:  General:  Pleasantly resting in bed, No acute distress. Alert to person place and situation(her baseline). HEENT:  Normocephalic atraumatic.  Sclerae nonicteric, noninjected.  Extraocular movements intact bilaterally. Neck:  Without mass or deformity.  Trachea is midline. Lungs:  Clear to auscultate bilaterally without rhonchi, wheeze, or rales. Heart:  Regular rate and rhythm.  Without murmurs, rubs, or gallops. Abdomen:  Soft, nontender, nondistended.  Without guarding or rebound. Extremities: Without cyanosis, clubbing, edema, or obvious deformity. Skin:  Warm and dry, no erythema, no rashes   Data Reviewed: I have personally reviewed following labs and imaging studies  CBC: Recent Labs  Lab 11/27/22 1756 11/28/22 1355 11/29/22 0759 11/30/22 0511 12/01/22 0744 12/02/22 0418  WBC 7.2 6.2 6.2 6.8 5.8 6.1  NEUTROABS 4.5  --   --   --   --   --   HGB 11.2* 12.8 12.8 12.5 12.8 12.7  HCT 33.3* 37.9 39.5 36.5 37.6 37.5  MCV 88.1 86.7 89.4 87.5 87.9 87.0  PLT 232 242 244 228 241 [id number]    Basic Metabolic Panel: Recent Labs  Lab 11/28/22 1355 11/29/22 0759 11/30/22 0511 12/01/22 0744 12/02/22 0418  NA 135 136 132* 132* 133*  K 3.7 3.7 4.1 4.0 3.9  CL 101 102 99 100 98  CO2 '25 22 26 25 25  '$ GLUCOSE 220* 170* 254* 194* 185*  BUN '16 21 22 18 17  '$ CREATININE 0.66 0.79 0.83 0.69 0.76  CALCIUM 9.2 9.3 9.3 9.3 9.2  MG 1.4*  --   --   --   --     GFR: Estimated Creatinine Clearance:  34.2 mL/min (by C-G formula based on SCr of 0.76 mg/dL). Liver Function Tests: Recent Labs  Lab 11/27/22 1756 11/28/22 1355 11/29/22 0759  AST '23 22 21  '$ ALT '20 18 17  '$ ALKPHOS 94 70 64  BILITOT 0.6 0.8 1.2  PROT 6.1* 5.8* 5.7*  ALBUMIN 3.5 3.4* 3.3*   HbA1C: Recent Labs    11/29/22 0759  HGBA1C 8.7*    CBG: Recent Labs  Lab 12/01/22 0633 12/01/22 1156 12/01/22 1609 12/01/22 2111 12/02/22 0644  GLUCAP 212* 252* 167* 188* 168*    Lipid Profile: Recent Labs    11/29/22 0759  CHOL 167  HDL 81  LDLCALC 69  TRIG 87  CHOLHDL 2.1    Recent Results (from the past 240 hour(s))  Resp panel by RT-PCR (RSV, Flu A&B, Covid) Anterior Nasal Swab     Status: None   Collection Time: 11/27/22  6:20 PM   Specimen: Anterior Nasal Swab  Result Value Ref Range Status   SARS Coronavirus 2 by RT PCR NEGATIVE NEGATIVE Final    Comment: (NOTE) SARS-CoV-2 target nucleic acids are NOT DETECTED.  The SARS-CoV-2 RNA is generally detectable in upper respiratory  specimens during the acute phase of infection. The lowest concentration of SARS-CoV-2 viral copies this assay can detect is 138 copies/mL. A negative result does not preclude SARS-Cov-2 infection and should not be used as the sole basis for treatment or other patient management decisions. A negative result may occur with  improper specimen collection/handling, submission of specimen other than nasopharyngeal swab, presence of viral mutation(s) within the areas targeted by this assay, and inadequate number of viral copies(<138 copies/mL). A negative result must be combined with clinical observations, patient history, and epidemiological information. The expected result is Negative.  Fact Sheet for Patients:  EntrepreneurPulse.com.au  Fact Sheet for Healthcare Providers:  IncredibleEmployment.be  This test is no t yet approved or cleared by the [place] FDA and  has been authorized  for detection and/or diagnosis of SARS-CoV-2 by FDA under an Emergency Use Authorization (EUA). This EUA will remain  in effect (meaning this test can be used) for the duration of the COVID-19 declaration under Section 564(b)(1) of the Act, 21 U.S.C.section 360bbb-3(b)(1), unless the authorization is terminated  or revoked sooner.       Influenza A by PCR NEGATIVE NEGATIVE Final   Influenza B by PCR NEGATIVE NEGATIVE Final    Comment: (NOTE) The Xpert Xpress SARS-CoV-2/FLU/RSV plus assay is intended as an aid in the diagnosis of influenza from Nasopharyngeal swab specimens and should not be used as a sole basis for treatment. Nasal washings and aspirates are unacceptable for Xpert Xpress SARS-CoV-2/FLU/RSV testing.  Fact Sheet for Patients: EntrepreneurPulse.com.au  Fact Sheet for Healthcare Providers: IncredibleEmployment.be  This test is not yet approved or cleared by the [place] FDA and has been authorized for detection and/or diagnosis of SARS-CoV-2 by FDA under an Emergency Use Authorization (EUA). This EUA will remain in effect (meaning this test can be used) for the duration of the COVID-19 declaration under Section 564(b)(1) of the Act, 21 U.S.C. section 360bbb-3(b)(1), unless the authorization is terminated or revoked.     Resp Syncytial Virus by PCR NEGATIVE NEGATIVE Final    Comment: (NOTE) Fact Sheet for Patients: EntrepreneurPulse.com.au  Fact Sheet for Healthcare Providers: IncredibleEmployment.be  This test is not yet approved or cleared by the [place] FDA and has been authorized for detection and/or diagnosis of SARS-CoV-2 by FDA under an Emergency Use Authorization (EUA). This EUA will remain in effect (meaning this test can be used) for the duration of the COVID-19 declaration under Section 564(b)(1) of the Act, 21 U.S.C. section 360bbb-3(b)(1), unless the authorization is  terminated or revoked.  Performed at [facility], [address], [place], [place] [address]          Radiology Studies: No results found.  Scheduled Meds:  aspirin EC  81 mg Oral Daily   atorvastatin  40 mg Oral Daily   enoxaparin (LOVENOX) injection  30 mg Subcutaneous Q24H   insulin aspart  0-5 Units Subcutaneous QHS   insulin aspart  0-9 Units Subcutaneous TID WC   metFORMIN  1,000 mg Oral BID WC   sodium chloride flush  3 mL Intravenous Q12H   Continuous Infusions:    LOS: 3 days   Time spent: 4mn  Briannah Lona C Toria Monte, DO [facility]  If 7PM-7AM, please contact night-coverage [email or web address]  12/02/2022, 7:47 AM

## 2022-12-02 NOTE — Progress Notes (Signed)
Pt very anxious. Doesn't want son to leave. Pt sitting on thick doughnut cushion family brought in and trying to lay back in bed. Very difficult to get her situated in bed so she'll relax.

## 2022-12-02 NOTE — Progress Notes (Signed)
Mobility Specialist - Progress Note   12/02/22 1002  Mobility  Activity Ambulated with assistance in hallway  Level of Assistance Contact guard assist, steadying assist  Assistive Device Front wheel walker  Distance Ambulated (ft) 300 ft  Activity Response Tolerated well  Mobility Referral Yes  $Mobility charge 1 Mobility   Pt was received in bed and agreeable to mobility. Pt was slightly upset that she would not be going straight home once leaving hospital. No other complaints throughout session. Pt was returned to bed with all needs met and bed alarm on.  Franki Monte  Mobility Specialist Please contact via Solicitor or Rehab office at [phone]

## 2022-12-02 NOTE — Progress Notes (Signed)
Per handoff from weekday SW, plan is for [facility] Monday. SW will follow.   Wandra Feinstein, MSW, LCSW [phone] (coverage)

## 2022-12-03 DIAGNOSIS — R2990 Unspecified symptoms and signs involving the nervous system: Secondary | ICD-10-CM | POA: Diagnosis not present

## 2022-12-03 LAB — GLUCOSE, CAPILLARY
Glucose-Capillary: 173 mg/dL — ABNORMAL HIGH (ref 70–99)
Glucose-Capillary: 196 mg/dL — ABNORMAL HIGH (ref 70–99)
Glucose-Capillary: 248 mg/dL — ABNORMAL HIGH (ref 70–99)
Glucose-Capillary: 225 mg/dL — ABNORMAL HIGH (ref 70–99)

## 2022-12-03 NOTE — Progress Notes (Signed)
PROGRESS NOTE    Kiara Blair  [id number] DOB: [birth year] DOA: 11/27/2022 PCP: Rich Fuchs, PA   Brief Narrative:  Kiara Blair is a 89 y.o. female with medical history significant of stroke, diabetes, hypertension, hyperlipidemia, neuropathy, tremor, GERD, anxiety, cataract, hearing loss, paresthesia presenting with altered mental status and weakness. History of slurred speech and L sided paralysis previously - questionable Right sided weakness and dysphagia concerning for stroke - hospitalist called for admission.  Patient medically stable for discharge, awaiting safe disposition at SNF now rather than ALF as previously planned (family has changed their mind on dispo location), patient has completed her 3rd midnight and has completed her antibiotic course. She is essentially back to baseline other than ongoing ambulatory dysfunction, weakness, and falls per family.  Assessment & Plan:   Principal Problem:   Stroke-like symptoms Active Problems:   Essential hypertension   Diabetes mellitus type 2 in nonobese (HCC)   History of CVA (cerebrovascular accident)   Diabetic polyneuropathy associated with type 2 diabetes mellitus (HCC)   Gastroesophageal reflux disease   Generalized anxiety disorder   Hyperlipidemia LDL goal [id number]   Acute metabolic encephalopathy   Acute metabolic encephalopathy, resolved UTI, POA, resolved - Ceftriaxone x3 days (last dose 12/01/2022) - Symptoms confirmed at bedside with patient, limited history but admits to increased frequency/hesitancy/urgency with urination this week consistent with her typical UTI symptoms concurrent with metabolic encephalopathy -Patient does not meet sepsis criteria  Stroke-like symptoms, cannot rule out acute CVA, POA History of prior stroke -Transient episode of unilateral weakness on the right, baseline weakness on the left from prior stroke -Initial CT negative, MRI unable to be obtained due to patient's intolerance and  claustrophobia - attempted repeat CT but this too was unable to be performed due to patient's claustrophobia(despite having one just the day prior). -Neurology consulted for additional insight and recommendations.  Appreciate their insight and recommendations. -Echocardiogram EF 60 to 65% with normal left ventricular function without regional wall abnormalities but mild concentric hypertrophy, grade 1 diastolic dysfunction noted.  Valves without any overt vegetation or abnormalities other than mild calcification of the aortic valve. - SLP eval passed - continue to advance diet as tolerated - PT/OT following, current recommendation for SNF given ongoing ambulatory dysfunction and fall risk -A1c elevated 8.7  Uncontrolled diabetes with hyperglycemia -SSI ongoing -Resume home kazano 12.02/999 BID at discharge(Continue metformin inpatient for now) -A1c 8.7 - would prefer elevated glucose in this age group over tightly controlled A1c for risk of symptoms/hypoglycemia   Hypertension - Holding home antihypertensives for permissive HTN   Hyperlipidemia - Continue home atorvastatin   Neuropathy Hearing loss History of paresthesias - Noted to be at baseline, no indication for further testing  DVT prophylaxis: Lovenox Code Status: Full Family Communication: Son at bedside  Status is: Inpatient  Dispo: The patient is from: Home              Anticipated d/c is to: SNF              Anticipated d/c date is: Imminent              Patient currently is medically stable for discharge  Consultants:  Neurology  Procedures:  None  Antimicrobials:  Ceftriaxone completed  Subjective: No acute issues or events overnight, patient in good spirits this morning denies nausea vomiting diarrhea constipation headache fevers chills or chest pain.  She is requesting discharge back home -we discussed her ongoing weakness and ambulatory dysfunction and  that she would need to discuss with her family for  disposition planning/location.  Objective: Vitals:   12/02/22 1552 12/02/22 1943 12/03/22 0057 12/03/22 0356  BP: (!) 169/62 (!) 163/63 (!) 168/57 (!) 146/56  Pulse: 66 77 64 65  Resp: '16 18 15 17  '$ Temp: 98 F (36.7 C) 97.7 F (36.5 C) (!) 97.4 F (36.3 C) 98.1 F (36.7 C)  TempSrc: Oral Oral Oral   SpO2: 100% 100% 98% 97%  Weight:      Height:        Intake/Output Summary (Last 24 hours) at 12/03/2022 0705 Last data filed at 12/02/2022 1700 Gross per 24 hour  Intake 930 ml  Output --  Net 930 ml    Filed Weights   11/27/22 1630  Weight: 46.3 kg    Examination:  General:  Pleasantly resting in bed, No acute distress. Alert to person place and situation(her baseline). HEENT:  Normocephalic atraumatic.  Sclerae nonicteric, noninjected.  Extraocular movements intact bilaterally. Neck:  Without mass or deformity.  Trachea is midline. Lungs:  Clear to auscultate bilaterally without rhonchi, wheeze, or rales. Heart:  Regular rate and rhythm.  Without murmurs, rubs, or gallops. Abdomen:  Soft, nontender, nondistended.  Without guarding or rebound. Extremities: Without cyanosis, clubbing, edema, or obvious deformity. Skin:  Warm and dry, no erythema, no rashes   Data Reviewed: I have personally reviewed following labs and imaging studies  CBC: Recent Labs  Lab 11/27/22 1756 11/28/22 1355 11/29/22 0759 11/30/22 0511 12/01/22 0744 12/02/22 0418  WBC 7.2 6.2 6.2 6.8 5.8 6.1  NEUTROABS 4.5  --   --   --   --   --   HGB 11.2* 12.8 12.8 12.5 12.8 12.7  HCT 33.3* 37.9 39.5 36.5 37.6 37.5  MCV 88.1 86.7 89.4 87.5 87.9 87.0  PLT 232 242 244 228 241 [id number]    Basic Metabolic Panel: Recent Labs  Lab 11/28/22 1355 11/29/22 0759 11/30/22 0511 12/01/22 0744 12/02/22 0418  NA 135 136 132* 132* 133*  K 3.7 3.7 4.1 4.0 3.9  CL 101 102 99 100 98  CO2 '25 22 26 25 25  '$ GLUCOSE 220* 170* 254* 194* 185*  BUN '16 21 22 18 17  '$ CREATININE 0.66 0.79 0.83 0.69 0.76  CALCIUM 9.2  9.3 9.3 9.3 9.2  MG 1.4*  --   --   --   --     GFR: Estimated Creatinine Clearance: 34.2 mL/min (by C-G formula based on SCr of 0.76 mg/dL). Liver Function Tests: Recent Labs  Lab 11/27/22 1756 11/28/22 1355 11/29/22 0759  AST '23 22 21  '$ ALT '20 18 17  '$ ALKPHOS 94 70 64  BILITOT 0.6 0.8 1.2  PROT 6.1* 5.8* 5.7*  ALBUMIN 3.5 3.4* 3.3*   HbA1C: No results for input(s): "HGBA1C" in the last 72 hours.  CBG: Recent Labs  Lab 12/02/22 0644 12/02/22 1115 12/02/22 1551 12/02/22 2113 12/03/22 0659  GLUCAP 168* 262* 157* 206* 173*    Lipid Profile: No results for input(s): "CHOL", "HDL", "LDLCALC", "TRIG", "CHOLHDL", "LDLDIRECT" in the last 72 hours.  Recent Results (from the past 240 hour(s))  Resp panel by RT-PCR (RSV, Flu A&B, Covid) Anterior Nasal Swab     Status: None   Collection Time: 11/27/22  6:20 PM   Specimen: Anterior Nasal Swab  Result Value Ref Range Status   SARS Coronavirus 2 by RT PCR NEGATIVE NEGATIVE Final    Comment: (NOTE) SARS-CoV-2 target nucleic acids are NOT DETECTED.  The SARS-CoV-2  RNA is generally detectable in upper respiratory specimens during the acute phase of infection. The lowest concentration of SARS-CoV-2 viral copies this assay can detect is 138 copies/mL. A negative result does not preclude SARS-Cov-2 infection and should not be used as the sole basis for treatment or other patient management decisions. A negative result may occur with  improper specimen collection/handling, submission of specimen other than nasopharyngeal swab, presence of viral mutation(s) within the areas targeted by this assay, and inadequate number of viral copies(<138 copies/mL). A negative result must be combined with clinical observations, patient history, and epidemiological information. The expected result is Negative.  Fact Sheet for Patients:  EntrepreneurPulse.com.au  Fact Sheet for Healthcare Providers:   IncredibleEmployment.be  This test is no t yet approved or cleared by the [place] FDA and  has been authorized for detection and/or diagnosis of SARS-CoV-2 by FDA under an Emergency Use Authorization (EUA). This EUA will remain  in effect (meaning this test can be used) for the duration of the COVID-19 declaration under Section 564(b)(1) of the Act, 21 U.S.C.section 360bbb-3(b)(1), unless the authorization is terminated  or revoked sooner.       Influenza A by PCR NEGATIVE NEGATIVE Final   Influenza B by PCR NEGATIVE NEGATIVE Final    Comment: (NOTE) The Xpert Xpress SARS-CoV-2/FLU/RSV plus assay is intended as an aid in the diagnosis of influenza from Nasopharyngeal swab specimens and should not be used as a sole basis for treatment. Nasal washings and aspirates are unacceptable for Xpert Xpress SARS-CoV-2/FLU/RSV testing.  Fact Sheet for Patients: EntrepreneurPulse.com.au  Fact Sheet for Healthcare Providers: IncredibleEmployment.be  This test is not yet approved or cleared by the [place] FDA and has been authorized for detection and/or diagnosis of SARS-CoV-2 by FDA under an Emergency Use Authorization (EUA). This EUA will remain in effect (meaning this test can be used) for the duration of the COVID-19 declaration under Section 564(b)(1) of the Act, 21 U.S.C. section 360bbb-3(b)(1), unless the authorization is terminated or revoked.     Resp Syncytial Virus by PCR NEGATIVE NEGATIVE Final    Comment: (NOTE) Fact Sheet for Patients: EntrepreneurPulse.com.au  Fact Sheet for Healthcare Providers: IncredibleEmployment.be  This test is not yet approved or cleared by the [place] FDA and has been authorized for detection and/or diagnosis of SARS-CoV-2 by FDA under an Emergency Use Authorization (EUA). This EUA will remain in effect (meaning this test can be used) for  the duration of the COVID-19 declaration under Section 564(b)(1) of the Act, 21 U.S.C. section 360bbb-3(b)(1), unless the authorization is terminated or revoked.  Performed at [facility], [address] [place], [place] [address]          Radiology Studies: No results found.  Scheduled Meds:  amLODipine  5 mg Oral Daily   And   benazepril  20 mg Oral Daily   aspirin EC  81 mg Oral Daily   atorvastatin  40 mg Oral Daily   enoxaparin (LOVENOX) injection  30 mg Subcutaneous Q24H   insulin aspart  0-5 Units Subcutaneous QHS   insulin aspart  0-9 Units Subcutaneous TID WC   metFORMIN  1,000 mg Oral BID WC   sodium chloride flush  3 mL Intravenous Q12H   Continuous Infusions:    LOS: 4 days   Time spent: 66mn  Lucus Lambertson C Crystal Ellwood, DO [facility]  If 7PM-7AM, please contact night-coverage [email or web address]  12/03/2022, 7:05 AM

## 2022-12-04 DIAGNOSIS — R2990 Unspecified symptoms and signs involving the nervous system: Secondary | ICD-10-CM | POA: Diagnosis not present

## 2022-12-04 LAB — GLUCOSE, CAPILLARY
Glucose-Capillary: 181 mg/dL — ABNORMAL HIGH (ref 70–99)
Glucose-Capillary: 209 mg/dL — ABNORMAL HIGH (ref 70–99)
Glucose-Capillary: 275 mg/dL — ABNORMAL HIGH (ref 70–99)
Glucose-Capillary: 282 mg/dL — ABNORMAL HIGH (ref 70–99)

## 2022-12-04 MED ORDER — ASPIRIN 81 MG PO TBEC: 81.0000 mg | DELAYED_RELEASE_TABLET | Freq: Every day | ORAL | 12 refills | Status: AC

## 2022-12-04 NOTE — TOC Transition Note (Signed)
Transition of Care [facility]) - CM/SW Discharge Note   Patient Details  Name: Kiara Blair MRN: [id number] Date of Birth: 1930/06/05  [facility] CM/SW Contact:  Geralynn Ochs, LCSW Phone Number: 12/04/2022, 11:09 AM   Clinical Narrative:   CSW heard back from [facility] this morning that they do not have any beds available. [facility] able to accept patient today. CSW confirmed with MD medical stability, sent discharge information to [facility]. CSW spoke with son, Lanny Hurst, who is in agreement with transfer to SNF today. Transport arranged with [facility] for after 2 pm, per family request to assist patient with transfer. No other TOC needs.  Nurse to call report to [phone], Room 104P.    Final next level of care: Skilled Nursing Facility Barriers to Discharge: Barriers Resolved   Patient Goals and CMS Choice CMS Medicare.gov Compare Post Acute Care list provided to:: Patient Represenative (must comment) Choice offered to / list presented to : Adult Children  Discharge Placement                Patient [facility] bed at: [facility] Patient to be transferred to facility by: [facility] Name of family member notified: Lanny Hurst Patient and family notified of of transfer: 12/04/22  Discharge Plan and Services Additional resources added to the After Visit Summary for       Post Acute Care Choice: [place]                               Social Determinants of Health (SDOH) Interventions SDOH Screenings   Food Insecurity: No Food Insecurity (11/28/2022)  Housing: Low Risk  (11/28/2022)  Transportation Needs: No Transportation Needs (11/28/2022)  Utilities: Not At Risk (11/28/2022)  Tobacco Use: Low Risk  (11/28/2022)     Readmission Risk Interventions     No data to display

## 2022-12-04 NOTE — Progress Notes (Addendum)
Physical Therapy Treatment Patient Details Name: Kiara Blair MRN: [id number] DOB: [birth year] Today's Date: 12/04/2022   History of Present Illness 89 y.o. female presents to [facility] hospital on 11/27/2022 with AMS and weakness. In ED pt with R weakness and difficulty eating. PMH: DM2, HTN, RUE tremor, CVA.    PT Comments    Pt pleasant and motivated to participate in physical therapy session. Oriented to self only, but following all commands. Pt ambulating limited hallway distances with a RW at a min guard assist level. Able to participate in standing exercises and multi-directional stepping for functional strengthening and balance. Gait speed of 1.83 ft/s indicative that pt may be at higher risk for falls in combination with history of falls and decreased safety awareness. Continue to recommend SNF for ongoing Physical Therapy.      Recommendations for follow up therapy are one component of a multi-disciplinary discharge planning process, led by the attending physician.  Recommendations may be updated based on patient status, additional functional criteria and insurance authorization.  Follow Up Recommendations  Skilled nursing-short term rehab (<3 hours/day) Can patient physically be transported by private vehicle: Yes   Assistance Recommended at Discharge Frequent or constant Supervision/Assistance  Patient can return home with the following A little help with walking and/or transfers;A little help with bathing/dressing/bathroom;Assistance with cooking/housework;Direct supervision/assist for medications management;Direct supervision/assist for financial management;Assist for transportation;Help with stairs or ramp for entrance   Equipment Recommendations  BSC/3in1    Recommendations for Other Services       Precautions / Restrictions Precautions Precautions: Fall Precaution Comments: essential tremor Restrictions Weight Bearing Restrictions: No     Mobility  Bed Mobility Overal bed  mobility: Modified Independent                  Transfers Overall transfer level: Needs assistance Equipment used: Rolling walker (2 wheels), None Transfers: Sit to/from Stand Sit to Stand: Min guard                Ambulation/Gait Ambulation/Gait assistance: Min guard Gait Distance (Feet): 150 Feet (150", 150") Assistive device: Rolling walker (2 wheels) Gait Pattern/deviations: Step-through pattern, Decreased stride length, Trunk flexed Gait velocity: decr     General Gait Details: Min guard for safety, no gross unsteadiness or overt LOB   Stairs             Wheelchair Mobility    Modified Rankin (Stroke Patients Only)       Balance Overall balance assessment: Needs assistance Sitting-balance support: No upper extremity supported, Feet supported Sitting balance-Leahy Scale: Good     Standing balance support: During functional activity, Single extremity supported Standing balance-Leahy Scale: Poor Standing balance comment: reliant on single UE support when washing face at sink                            Cognition Arousal/Alertness: Awake/alert Behavior During Therapy: WFL for tasks assessed/performed Overall Cognitive Status: Impaired/Different from baseline Area of Impairment: Safety/judgement, Awareness, Problem solving, Orientation, Attention, Memory, Following commands                 Orientation Level: Place, Time, Situation Current Attention Level: Selective Memory: Decreased recall of precautions, Decreased short-term memory Following Commands: Follows one step commands with increased time, Follows multi-step commands with increased time Safety/Judgement: Decreased awareness of safety, Decreased awareness of deficits Awareness: Emergent Problem Solving: Slow processing, Requires verbal cues General Comments: Not oriented to month or year, stating  it was January 1994. Very pleasant and follows all commands         Exercises General Exercises - Lower Extremity Heel Raises: Both, 10 reps, Standing Mini-Sqauts: Both, 10 reps, Standing Other Exercises Other Exercises: Lateral stepping to R/L with bilateral hand support    General Comments        Pertinent Vitals/Pain Pain Assessment Pain Assessment: No/denies pain    Home Living                          Prior Function            PT Goals (current goals can now be found in the care plan section) Acute Rehab PT Goals PT Goal Formulation: With patient/family Time For Goal Achievement: 12/12/22 Potential to Achieve Goals: Good Progress towards PT goals: Progressing toward goals    Frequency    Min 2X/week      PT Plan Current plan remains appropriate    Co-evaluation              AM-PAC PT "6 Clicks" Mobility   Outcome Measure  Help needed turning from your back to your side while in a flat bed without using bedrails?: None Help needed moving from lying on your back to sitting on the side of a flat bed without using bedrails?: None Help needed moving to and from a bed to a chair (including a wheelchair)?: A Little Help needed standing up from a chair using your arms (e.g., wheelchair or bedside chair)?: A Little Help needed to walk in hospital room?: A Little Help needed climbing 3-5 steps with a railing? : A Little 6 Click Score: 20    End of Session Equipment Utilized During Treatment: Gait belt Activity Tolerance: Patient tolerated treatment well Patient left: in bed;with call bell/phone within reach;with family/visitor present Nurse Communication: Mobility status PT Visit Diagnosis: Other abnormalities of gait and mobility (R26.89);Muscle weakness (generalized) (M62.81);History of falling (Z91.81)     Time: [id number] PT Time Calculation (min) (ACUTE ONLY): 23 min  Charges:  $Therapeutic Activity: 23-37 mins                     Wyona Almas, PT, DPT Acute Rehabilitation Services Office  [phone]    Deno Etienne 12/04/2022, 12:43 PM

## 2022-12-04 NOTE — Progress Notes (Signed)
Occupational Therapy Treatment Patient Details Name: Kiara Blair MRN: [id number] DOB: 06-11-1930 Today's Date: 12/04/2022   History of present illness 89 y.o. female presents to [facility] hospital on 11/27/2022 with AMS and weakness. In ED pt with R weakness and difficulty eating. PMH: DM2, HTN, RUE tremor, CVA.   OT comments  Patient with bed exit, telling RN she was short of breath and just needed to walk. Session focus on path finding, upper level cognition and activity tolerance. Patient continues to require cues for path finding and re-orientation, but very pleasant and cooperative. Handed off to EMS transport at end of session. OT will follow acutely.    Recommendations for follow up therapy are one component of a multi-disciplinary discharge planning process, led by the attending physician.  Recommendations may be updated based on patient status, additional functional criteria and insurance authorization.    Follow Up Recommendations  Skilled nursing-short term rehab (<3 hours/day)     Assistance Recommended at Discharge Frequent or constant Supervision/Assistance  Patient can return home with the following  A little help with walking and/or transfers;A lot of help with bathing/dressing/bathroom;Assistance with cooking/housework;Direct supervision/assist for medications management;Direct supervision/assist for financial management;Assist for transportation;Help with stairs or ramp for entrance   Equipment Recommendations  Other (comment) (Defer to next venue)    Recommendations for Other Services      Precautions / Restrictions Precautions Precautions: Fall Precaution Comments: essential tremor Restrictions Weight Bearing Restrictions: No       Mobility Bed Mobility Overal bed mobility: Modified Independent             General bed mobility comments: recieved up with RN, handed off to OT    Transfers Overall transfer level: Needs assistance Equipment used: Rolling walker  (2 wheels), None Transfers: Sit to/from Stand Sit to Stand: Min guard           General transfer comment: recieved up with RN, handed off to OT     Balance Overall balance assessment: Needs assistance Sitting-balance support: No upper extremity supported, Feet supported Sitting balance-Leahy Scale: Good     Standing balance support: During functional activity, Single extremity supported Standing balance-Leahy Scale: Poor Standing balance comment: reliant on single UE support when washing face at sink                           ADL either performed or assessed with clinical judgement   ADL Overall ADL's : Needs assistance/impaired                         Toilet Transfer: Minimal assistance;Cueing for sequencing;Cueing for safety;Ambulation           Functional mobility during ADLs: Minimal assistance;Cueing for sequencing;Cueing for safety;Rolling walker (2 wheels) General ADL Comments: Session focus on pathfinding, upper level cognition, and functional mobility    Extremity/Trunk Assessment              Vision       Perception     Praxis      Cognition Arousal/Alertness: Awake/alert Behavior During Therapy: WFL for tasks assessed/performed Overall Cognitive Status: Impaired/Different from baseline Area of Impairment: Safety/judgement, Awareness, Problem solving, Orientation, Attention, Memory, Following commands                 Orientation Level: Place, Time, Situation Current Attention Level: Selective Memory: Decreased recall of precautions, Decreased short-term memory Following Commands: Follows one step commands with increased  time, Follows multi-step commands with increased time Safety/Judgement: Decreased awareness of safety, Decreased awareness of deficits Awareness: Emergent Problem Solving: Slow processing, Requires verbal cues General Comments: Not oriented to month or year, stating it was January 1994. Very pleasant  and follows all commands        Exercises      Shoulder Instructions       General Comments      Pertinent Vitals/ Pain       Pain Assessment Pain Assessment: No/denies pain  Home Living                                          Prior Functioning/Environment              Frequency  Min 2X/week        Progress Toward Goals  OT Goals(current goals can now be found in the care plan section)  Progress towards OT goals: Progressing toward goals  Acute Rehab OT Goals Patient Stated Goal: to get better OT Goal Formulation: With patient Time For Goal Achievement: 12/13/22 Potential to Achieve Goals: Good  Plan Discharge plan remains appropriate;Frequency remains appropriate    Co-evaluation                 AM-PAC OT "6 Clicks" Daily Activity     Outcome Measure   Help from another person eating meals?: A Lot Help from another person taking care of personal grooming?: A Little Help from another person toileting, which includes using toliet, bedpan, or urinal?: A Little Help from another person bathing (including washing, rinsing, drying)?: A Little Help from another person to put on and taking off regular upper body clothing?: A Little Help from another person to put on and taking off regular lower body clothing?: A Little 6 Click Score: 17    End of Session Equipment Utilized During Treatment: Rolling walker (2 wheels)  OT Visit Diagnosis: Unsteadiness on feet (R26.81);Other abnormalities of gait and mobility (R26.89);Repeated falls (R29.6);Muscle weakness (generalized) (M62.81);History of falling (Z91.81);Other symptoms and signs involving cognitive function;Adult, failure to thrive (R62.7)   Activity Tolerance Patient tolerated treatment well   Patient Left Other (comment) (Handed off to EMS transport)   Nurse Communication Mobility status        Time: [id number] OT Time Calculation (min): 10 min  Charges: OT General  Charges $OT Visit: 1 Visit OT Treatments $Self Care/Home Management : 8-22 mins  Corinne Ports E. Oleg Oleson, OTR/L Acute Rehabilitation Services [phone]   Ascencion Dike 12/04/2022, 2:15 PM

## 2022-12-04 NOTE — Care Management Important Message (Signed)
Important Message  Patient Details  Name: Kiara Blair MRN: [id number] Date of Birth: 01/02/1930   Medicare Important Message Given:  Yes     [name] 12/04/2022, 4:12 PM

## 2022-12-04 NOTE — Plan of Care (Signed)
Pt amb frequently around the unit. Pt understands she needs to go to a SNF before going home for rehab but doesn't like it that the facility is so far from her house.

## 2022-12-04 NOTE — Discharge Summary (Signed)
Physician Discharge Summary  Kiara Blair [id number] DOB: [birth year] DOA: 11/27/2022  PCP: Rich Fuchs, PA  Admit date: 11/27/2022 Discharge date: 12/04/2022  Admitted From: Home Disposition: SNF  Recommendations for Outpatient Follow-up:  Follow up with PCP in 1-2 weeks to discuss medication changes/hospitalization Repeat labs, CBC BMP in the next 1 to 2 weeks  Discharge Condition: Stable CODE STATUS: Full Diet recommendation: Low-salt low-fat low-carb diabetic diet  Brief/Interim Summary: Kiara Blair is a 88 y.o. female with medical history significant of stroke, diabetes, hypertension, hyperlipidemia, neuropathy, tremor, GERD, anxiety, cataract, hearing loss, paresthesia presenting with altered mental status and weakness. History of slurred speech and L sided paralysis previously - questionable Right sided weakness and dysphagia concerning for stroke - hospitalist called for admission.   Patient medically stable for discharge -has been accepted to SNF otherwise stable and agreeable for discharge.  Patient has completed her antibiotic course. She is essentially back to baseline other than ongoing ambulatory dysfunction, weakness, and falls per family -discharge to SNF for ongoing physical therapy, ultimate disposition likely to assisted living thereafter per family's discussion.  Discharge Diagnoses:  Principal Problem:   Stroke-like symptoms Active Problems:   Essential hypertension   Diabetes mellitus type 2 in nonobese (HCC)   History of CVA (cerebrovascular accident)   Diabetic polyneuropathy associated with type 2 diabetes mellitus (HCC)   Gastroesophageal reflux disease   Generalized anxiety disorder   Hyperlipidemia LDL goal [id number]   Acute metabolic encephalopathy   Acute metabolic encephalopathy, resolved UTI, POA, resolved - Ceftriaxone x3 days (last dose 12/01/2022) - Symptoms confirmed at bedside with patient, limited history but admits to increased  frequency/hesitancy/urgency with urination this week consistent with her typical UTI symptoms concurrent with metabolic encephalopathy -Patient does not meet sepsis criteria   Stroke-like symptoms, cannot rule out acute CVA, POA History of prior stroke -Transient episode of unilateral weakness on the right, baseline weakness on the left from prior stroke -Initial CT negative, MRI unable to be obtained due to patient's intolerance and claustrophobia - attempted repeat CT but this too was unable to be performed due to patient's claustrophobia(despite having one just the day prior). -Neurology consulted for additional insight and recommendations.  Appreciate their insight and recommendations. -Echocardiogram EF 60 to 65% with normal left ventricular function without regional wall abnormalities but mild concentric hypertrophy, grade 1 diastolic dysfunction noted.  Valves without any overt vegetation or abnormalities other than mild calcification of the aortic valve. - SLP eval passed - continue to advance diet as tolerated - PT/OT following, current recommendation for SNF given ongoing ambulatory dysfunction and fall risk -A1c elevated 8.7   Uncontrolled diabetes with hyperglycemia -SSI ongoing -Resume home kazano 12.02/999 BID at discharge -A1c 8.7 - would prefer elevated glucose in this age group over tightly controlled A1c for risk of symptoms/hypoglycemia especially given fall risk   Hypertension -Resume home medications, continue to hold propranolol as below in the setting of bradycardia, likely playing some small role in patient's ambulatory dysfunction and falls.   Hyperlipidemia - Continue home atorvastatin   Neuropathy Hearing loss History of paresthesias - Noted to be at baseline, no indication for further testing  Discharge Instructions   Allergies as of 12/04/2022       Reactions   Doxycycline Hyclate Nausea And Vomiting   Levofloxacin Nausea And Vomiting   Meloxicam  Nausea And Vomiting   Naproxen-esomeprazole Mg Nausea And Vomiting   Nitrofurantoin Nausea And Vomiting   Other    Pt states she is  allergic to meds but does not know names   Pneumococcal Polysaccharide Vaccine Itching        Medication List     STOP taking these medications    propranolol 10 MG tablet Commonly known as: INDERAL       TAKE these medications    acetaminophen 325 MG tablet Commonly known as: Tylenol Take 2 tablets (650 mg total) by mouth every 6 (six) hours as needed. What changed:  when to take this reasons to take this   amLODipine-benazepril 5-20 MG capsule Commonly known as: LOTREL Take 1 capsule by mouth at bedtime.   aspirin EC 81 MG tablet Take 1 tablet (81 mg total) by mouth daily. Swallow whole. Start taking on: December 05, 2022   atorvastatin 40 MG tablet Commonly known as: LIPITOR Take 1 tablet (40 mg total) by mouth daily.   Kazano 12.02-999 MG Tabs Generic drug: Alogliptin-metFORMIN HCl Take 1 tablet by mouth in the morning and at bedtime.   lidocaine 5 % ointment Commonly known as: XYLOCAINE Apply 1 Application topically 4 (four) times daily as needed for moderate pain. What changed: when to take this   multivitamin with minerals Tabs tablet Take 1 tablet by mouth in the morning and at bedtime. Dr. Lenon Oms Vitamins.        Allergies  Allergen Reactions   Doxycycline Hyclate Nausea And Vomiting   Levofloxacin Nausea And Vomiting   Meloxicam Nausea And Vomiting   Naproxen-Esomeprazole Mg Nausea And Vomiting   Nitrofurantoin Nausea And Vomiting   Other     Pt states she is allergic to meds but does not know names   Pneumococcal Polysaccharide Vaccine Itching   Procedures/Studies: EEG adult  Result Date: 30-Nov-2022 Kiara Havens, MD     11-30-22 12:13 PM Patient Name: Kiara Blair MRN: [id number] Epilepsy Attending: Lora Blair Referring Physician/Provider: Rosalin Hawking, MD Date: 11-30-22 Duration: 24.35 mins  Patient history: 89yo F with ams. EEG to evaluate for seizure Level of alertness: Awake, asleep AEDs during EEG study: None Technical aspects: This EEG study was done with scalp electrodes positioned according to the 10-20 International system of electrode placement. Electrical activity was reviewed with band pass filter of 1-'70Hz'$ , sensitivity of 7 uV/mm, display speed of 5m/sec with a '60Hz'$  notched filter applied as appropriate. EEG data were recorded continuously and digitally stored.  Video monitoring was available and reviewed as appropriate. Description: The posterior dominant rhythm consists of 8-9 Hz activity of moderate voltage (25-35 uV) seen predominantly in posterior head regions, symmetric and reactive to eye opening and eye closing. Sleep was characterized by vertex waves, sleep spindles (12 to 14 Hz), maximal frontocentral region. Physiologic photic driving was not seen during photic stimulation.  Hyperventilation was not performed.   IMPRESSION: This study is within normal limits. No seizures or epileptiform discharges were seen throughout the recording. PLora Blair  ECHOCARDIOGRAM COMPLETE  Result Date: 202/22/24   ECHOCARDIOGRAM REPORT   Patient Name:   Kiara Blair Date of Exam: 202/22/24Medical Rec #:  [id number]   Height:       61.0 in Accession #:    [id number]  Weight:       102.0 lb Date of Birth:  607/29/1931    BSA:          1.419 m Patient Age:    89years     BP:           133/49 mmHg Patient Gender: F  HR:           75 bpm. Exam Location:  Inpatient Procedure: 2D Echo Indications:    TIA  History:        Patient has prior history of Echocardiogram examinations, most                 recent 01/04/2021. Stroke; Risk Factors:Diabetes, Hypertension                 and Dyslipidemia.  Sonographer:    Harvie Junior Referring Phys: [id number] Candace Gallus MELVIN  Sonographer Comments: Technically difficult study due to poor echo windows. Positioning. IMPRESSIONS  1. Mild  intracavitary gradient. Peak velocity 0.73 m/s. Peak gradient 2.1 mmHg. Left ventricular ejection fraction, by estimation, is 60 to 65%. Left ventricular ejection fraction by PLAX is 64 %. The left ventricle has normal function. The left ventricle has no regional wall motion abnormalities. There is mild concentric left ventricular hypertrophy. Left ventricular diastolic parameters are consistent with Grade I diastolic dysfunction (impaired relaxation).  2. Right ventricular systolic function is normal. The right ventricular size is normal. There is normal pulmonary artery systolic pressure.  3. A small pericardial effusion is present. The pericardial effusion is anterior to the right ventricle. There is no evidence of cardiac tamponade.  4. The mitral valve is normal in structure. No evidence of mitral valve regurgitation. No evidence of mitral stenosis.  5. The aortic valve is tricuspid. There is mild calcification of the aortic valve. There is mild thickening of the aortic valve. Aortic valve regurgitation is mild. No aortic stenosis is present. Aortic regurgitation PHT measures 441 msec.  6. The inferior vena cava is normal in size with greater than 50% respiratory variability, suggesting right atrial pressure of 3 mmHg. FINDINGS  Left Ventricle: Mild intracavitary gradient. Peak velocity 0.73 m/s. Peak gradient 2.1 mmHg. Left ventricular ejection fraction, by estimation, is 60 to 65%. Left ventricular ejection fraction by PLAX is 64 %. The left ventricle has normal function. The  left ventricle has no regional wall motion abnormalities. The left ventricular internal cavity size was normal in size. There is mild concentric left ventricular hypertrophy. Left ventricular diastolic parameters are consistent with Grade I diastolic dysfunction (impaired relaxation). Indeterminate filling pressures. Right Ventricle: The right ventricular size is normal. No increase in right ventricular wall thickness. Right ventricular  systolic function is normal. There is normal pulmonary artery systolic pressure. The tricuspid regurgitant velocity is 1.88 m/s, and  with an assumed right atrial pressure of 3 mmHg, the estimated right ventricular systolic pressure is 0000000 mmHg. Left Atrium: Left atrial size was normal in size. Right Atrium: Right atrial size was normal in size. Pericardium: A small pericardial effusion is present. The pericardial effusion is anterior to the right ventricle. There is no evidence of cardiac tamponade. Mitral Valve: The mitral valve is normal in structure. No evidence of mitral valve regurgitation. No evidence of mitral valve stenosis. Tricuspid Valve: The tricuspid valve is normal in structure. Tricuspid valve regurgitation is trivial. No evidence of tricuspid stenosis. Aortic Valve: The aortic valve is tricuspid. There is mild calcification of the aortic valve. There is mild thickening of the aortic valve. Aortic valve regurgitation is mild. Aortic regurgitation PHT measures 441 msec. No aortic stenosis is present. Aortic valve mean gradient measures 2.0 mmHg. Aortic valve peak gradient measures 3.4 mmHg. Aortic valve area, by VTI measures 1.57 cm. Pulmonic Valve: The pulmonic valve was normal in structure. Pulmonic valve regurgitation is not visualized. No  evidence of pulmonic stenosis. Aorta: The aortic root is normal in size and structure. Venous: The inferior vena cava is normal in size with greater than 50% respiratory variability, suggesting right atrial pressure of 3 mmHg. IAS/Shunts: No atrial level shunt detected by color flow Doppler.  LEFT VENTRICLE PLAX 2D LV EF:         Left            Diastology                ventricular     LV e' medial:    3.70 cm/s                ejection        LV E/e' medial:  12.2                fraction by     LV e' lateral:   4.90 cm/s                PLAX is 64      LV E/e' lateral: 9.2                %. LVIDd:         3.80 cm LVIDs:         2.50 cm LV PW:         1.30 cm LV  IVS:        1.25 cm LVOT diam:     1.70 cm LV SV:         29 LV SV Index:   20 LVOT Area:     2.27 cm  LV Volumes (MOD) LV vol d, MOD    44.8 ml A2C: LV vol d, MOD    76.6 ml A4C: LV vol s, MOD    20.4 ml A2C: LV vol s, MOD    28.4 ml A4C: LV SV MOD A2C:   24.4 ml LV SV MOD A4C:   76.6 ml LV SV MOD BP:    38.6 ml RIGHT VENTRICLE RV Basal diam:  3.20 cm RV Mid diam:    2.90 cm RV S prime:     14.80 cm/s TAPSE (M-mode): 1.6 cm LEFT ATRIUM             Index        RIGHT ATRIUM           Index LA diam:        3.00 cm 2.11 cm/m   RA Area:     10.40 cm LA Vol (A2C):   20.6 ml 14.52 ml/m  RA Volume:   24.00 ml  16.92 ml/m LA Vol (A4C):   33.4 ml 23.54 ml/m LA Biplane Vol: 27.5 ml 19.38 ml/m  AORTIC VALVE                    PULMONIC VALVE AV Area (Vmax):    1.47 cm     PV Vmax:       0.90 m/s AV Area (Vmean):   1.37 cm     PV Peak grad:  3.2 mmHg AV Area (VTI):     1.57 cm AV Vmax:           92.60 cm/s AV Vmean:          65.100 cm/s AV VTI:            0.184 m AV Peak Grad:      3.4 mmHg AV Mean Grad:  2.0 mmHg LVOT Vmax:         59.90 cm/s LVOT Vmean:        39.400 cm/s LVOT VTI:          0.128 m LVOT/AV VTI ratio: 0.69 AI PHT:            441 msec  AORTA Ao Root diam: 2.80 cm Ao Asc diam:  3.00 cm MITRAL VALVE               TRICUSPID VALVE MV Area (PHT): 3.53 cm    TR Peak grad:   14.1 mmHg MV Decel Time: 215 msec    TR Vmax:        188.00 cm/s MR Peak grad: 14.1 mmHg MR Vmax:      188.00 cm/s  SHUNTS MV E velocity: 45.10 cm/s  Systemic VTI:  0.13 m MV A velocity: 91.70 cm/s  Systemic Diam: 1.70 cm MV E/A ratio:  0.49 Skeet Latch MD Electronically signed by Skeet Latch MD Signature Date/Time: 11/29/2022/10:21:46 AM    Final    CT Head Wo Contrast  Result Date: 11/27/2022 CLINICAL DATA:  Persistent altered mental status EXAM: CT HEAD WITHOUT CONTRAST TECHNIQUE: Contiguous axial images were obtained from the base of the skull through the vertex without intravenous contrast. RADIATION DOSE  REDUCTION: This exam was performed according to the departmental dose-optimization program which includes automated exposure control, adjustment of the mA and/or kV according to patient size and/or use of iterative reconstruction technique. COMPARISON:  CT from earlier in the same day. FINDINGS: Brain: No evidence of acute infarction, hemorrhage, hydrocephalus, extra-axial collection or mass lesion/mass effect. Chronic atrophic and white matter ischemic changes are noted. No acute hemorrhage or acute infarction is seen. Vascular: No hyperdense vessel or unexpected calcification. Skull: Normal. Negative for fracture or focal lesion. Sinuses/Orbits: No acute finding. Other: None. IMPRESSION: Chronic atrophic and ischemic changes. No acute abnormality is noted. No significant change from the prior exam is seen. Electronically Signed   By: Inez Catalina M.D.   On: 11/27/2022 23:56   DG Chest Port 1 View  Result Date: 11/27/2022 CLINICAL DATA:  Altered mental status, confusion EXAM: PORTABLE CHEST 1 VIEW COMPARISON:  04/12/2022 FINDINGS: Atherosclerotic calcification of the aortic arch. Thoracic spondylosis noted. Heart size within normal limits. The lungs appear clear. Substantial degenerative glenohumeral arthropathy, left greater than right. No blunting of the costophrenic angles. IMPRESSION: 1. No active cardiopulmonary disease is radiographically apparent. 2. Thoracic spondylosis. 3. Substantial degenerative glenohumeral arthropathy, left greater than right. Electronically Signed   By: Van Clines M.D.   On: 11/27/2022 18:57   CT Head Wo Contrast  Result Date: 11/27/2022 CLINICAL DATA:  Mental status change of unknown cause.  Confusion. EXAM: CT HEAD WITHOUT CONTRAST TECHNIQUE: Contiguous axial images were obtained from the base of the skull through the vertex without intravenous contrast. RADIATION DOSE REDUCTION: This exam was performed according to the departmental dose-optimization program which  includes automated exposure control, adjustment of the mA and/or kV according to patient size and/or use of iterative reconstruction technique. COMPARISON:  11/07/2022 FINDINGS: Brain: No change. No acute finding. Age related volume loss and mild chronic small-vessel change of the white matter. No large vessel infarction. No mass, hemorrhage, hydrocephalus or extra-axial collection. Vascular: There is atherosclerotic calcification of the major vessels at the base of the brain. Skull: Normal Sinuses/Orbits: Clear/normal Other: None IMPRESSION: No acute or reversible finding. Age related volume loss and mild chronic small-vessel change of the white matter.  Electronically Signed   By: Nelson Chimes M.D.   On: 11/27/2022 18:56   DG Elbow Complete Right  Result Date: 11/07/2022 CLINICAL DATA:  Bilateral elbow pain after falling. EXAM: RIGHT ELBOW - COMPLETE 3+ VIEW; LEFT ELBOW - COMPLETE 3+ VIEW COMPARISON:  Left forearm radiographs 12/07/2013. FINDINGS: Right elbow: The bones are demineralized. No displaced fracture or dislocation identified. Mild spurring of the lateral humeral epicondyle. The joint spaces are preserved. On the lateral view, there is possible mild up lifting of the anterior fat pad, and a small joint effusion is difficult to exclude. There is focal soft tissue calcification posterior to the distal humerus. Left elbow: The bones are demineralized. No evidence of acute fracture, dislocation or elbow joint effusion. There is spurring of the coronoid process and lateral humeral epicondyle. IMPRESSION: 1. No evidence of acute displaced fracture or dislocation in either elbow. 2. Possible small right elbow joint effusion which could be a manifestation of occult osseous injury. 3. Osteopenia and degenerative changes as described. Electronically Signed   By: Richardean Sale M.D.   On: 11/07/2022 16:44   DG Elbow Complete Left  Result Date: 11/07/2022 CLINICAL DATA:  Bilateral elbow pain after falling.  EXAM: RIGHT ELBOW - COMPLETE 3+ VIEW; LEFT ELBOW - COMPLETE 3+ VIEW COMPARISON:  Left forearm radiographs 12/07/2013. FINDINGS: Right elbow: The bones are demineralized. No displaced fracture or dislocation identified. Mild spurring of the lateral humeral epicondyle. The joint spaces are preserved. On the lateral view, there is possible mild up lifting of the anterior fat pad, and a small joint effusion is difficult to exclude. There is focal soft tissue calcification posterior to the distal humerus. Left elbow: The bones are demineralized. No evidence of acute fracture, dislocation or elbow joint effusion. There is spurring of the coronoid process and lateral humeral epicondyle. IMPRESSION: 1. No evidence of acute displaced fracture or dislocation in either elbow. 2. Possible small right elbow joint effusion which could be a manifestation of occult osseous injury. 3. Osteopenia and degenerative changes as described. Electronically Signed   By: Richardean Sale M.D.   On: 11/07/2022 16:44   DG Shoulder Left  Result Date: 11/07/2022 CLINICAL DATA:  Pain after fall EXAM: LEFT SHOULDER - 3 VIEW COMPARISON:  X-ray 01/03/2021 FINDINGS: Osteopenia. No acute fracture or dislocation. Joint space loss of the [facility] joint and glenohumeral joint. Osteophytes are seen. Calcifications along the shoulder from calcific tendonitis or chondrocalcinosis. IMPRESSION: Osteopenia with degenerative changes. Electronically Signed   By: Jill Side M.D.   On: 11/07/2022 16:39   CT Maxillofacial Wo Contrast  Result Date: 11/07/2022 CLINICAL DATA:  Fall EXAM: CT MAXILLOFACIAL WITHOUT CONTRAST CT CERVICAL SPINE WITHOUT CONTRAST TECHNIQUE: Multidetector CT imaging of the maxillofacial structures was performed. Multiplanar CT image reconstructions were also generated. A small metallic BB was placed on the right temple in order to reliably differentiate right from left. Multidetector CT imaging of the cervical spine was performed without  intravenous contrast. Multiplanar CT image reconstructions were also generated. RADIATION DOSE REDUCTION: This exam was performed according to the departmental dose-optimization program which includes automated exposure control, adjustment of the mA and/or kV according to patient size and/or use of iterative reconstruction technique. COMPARISON:  CT head and cervical spine 05/02/2022 FINDINGS: CT MAXILLOFACIAL FINDINGS Osseous: There is no acute facial bone fracture. There is no evidence of mandibular dislocation. There is no suspicious osseous lesion. Orbits: Bilateral lens implants are in place. The globes are intact. There is no retrobulbar hematoma. Sinuses: The  paranasal sinuses are clear. Soft tissues: Unremarkable. Limited intracranial: Assessed on the separately dictated CT head. CT CERVICAL FINDINGS Alignment: Normal. There is no significant antero or retrolisthesis. There is no jumped or perched facet or other evidence of traumatic malalignment. Skull base and vertebrae: Skull base alignment is maintained. Vertebral body heights are preserved. There is no evidence of acute fracture. There is no suspicious osseous lesion. Soft tissues and spinal canal: No prevertebral fluid or swelling. No visible canal hematoma. Disc levels: There is disc space narrowing and degenerative endplate change most advanced at C4-C5 and C5-C6. Facet arthropathy is most advanced on the left at C3-C4 and bilaterally at C4-C5 and C5-C6. There is probable multilevel moderate spinal canal stenosis. Findings are overall unchanged. Upper chest: The imaged lung apices are clear. Other: The right thyroid lobe is enlarged with a nodule measuring up to 3.3 cm. This has been previously evaluated by ultrasound. IMPRESSION: 1. No acute fracture or traumatic malalignment of the cervical spine. 2. No acute facial bone fracture. 3. CT head is reported separately. Electronically Signed   By: Valetta Mole M.D.   On: 11/07/2022 16:03   CT Cervical  Spine Wo Contrast  Result Date: 11/07/2022 CLINICAL DATA:  Fall EXAM: CT MAXILLOFACIAL WITHOUT CONTRAST CT CERVICAL SPINE WITHOUT CONTRAST TECHNIQUE: Multidetector CT imaging of the maxillofacial structures was performed. Multiplanar CT image reconstructions were also generated. A small metallic BB was placed on the right temple in order to reliably differentiate right from left. Multidetector CT imaging of the cervical spine was performed without intravenous contrast. Multiplanar CT image reconstructions were also generated. RADIATION DOSE REDUCTION: This exam was performed according to the departmental dose-optimization program which includes automated exposure control, adjustment of the mA and/or kV according to patient size and/or use of iterative reconstruction technique. COMPARISON:  CT head and cervical spine 05/02/2022 FINDINGS: CT MAXILLOFACIAL FINDINGS Osseous: There is no acute facial bone fracture. There is no evidence of mandibular dislocation. There is no suspicious osseous lesion. Orbits: Bilateral lens implants are in place. The globes are intact. There is no retrobulbar hematoma. Sinuses: The paranasal sinuses are clear. Soft tissues: Unremarkable. Limited intracranial: Assessed on the separately dictated CT head. CT CERVICAL FINDINGS Alignment: Normal. There is no significant antero or retrolisthesis. There is no jumped or perched facet or other evidence of traumatic malalignment. Skull base and vertebrae: Skull base alignment is maintained. Vertebral body heights are preserved. There is no evidence of acute fracture. There is no suspicious osseous lesion. Soft tissues and spinal canal: No prevertebral fluid or swelling. No visible canal hematoma. Disc levels: There is disc space narrowing and degenerative endplate change most advanced at C4-C5 and C5-C6. Facet arthropathy is most advanced on the left at C3-C4 and bilaterally at C4-C5 and C5-C6. There is probable multilevel moderate spinal canal  stenosis. Findings are overall unchanged. Upper chest: The imaged lung apices are clear. Other: The right thyroid lobe is enlarged with a nodule measuring up to 3.3 cm. This has been previously evaluated by ultrasound. IMPRESSION: 1. No acute fracture or traumatic malalignment of the cervical spine. 2. No acute facial bone fracture. 3. CT head is reported separately. Electronically Signed   By: Valetta Mole M.D.   On: 11/07/2022 16:03   CT Head Wo Contrast  Result Date: 11/07/2022 CLINICAL DATA:  Head trauma EXAM: CT HEAD WITHOUT CONTRAST TECHNIQUE: Contiguous axial images were obtained from the base of the skull through the vertex without intravenous contrast. RADIATION DOSE REDUCTION: This exam was  performed according to the departmental dose-optimization program which includes automated exposure control, adjustment of the mA and/or kV according to patient size and/or use of iterative reconstruction technique. COMPARISON:  MRI brain 01/04/2021. FINDINGS: Brain: No evidence of acute infarction, hemorrhage, hydrocephalus, extra-axial collection or mass lesion/mass effect. There is mild diffuse atrophy and mild periventricular white matter hypodensity, likely chronic small vessel ischemic change. Vascular: Atherosclerotic calcifications are present within the cavernous internal carotid arteries. Skull: Normal. Negative for fracture or focal lesion. Sinuses/Orbits: No acute finding. Other: None. IMPRESSION: 1. No acute intracranial process. 2. Mild diffuse atrophy and mild chronic small vessel ischemic change. Electronically Signed   By: Ronney Asters M.D.   On: 11/07/2022 15:56     Subjective: No acute issues or events overnight   Discharge Exam: Vitals:   12/04/22 0351 12/04/22 0830  BP: (!) 157/58 (!) 181/63  Pulse: 61 72  Resp: 18 18  Temp: 97.7 F (36.5 C) 98.2 F (36.8 C)  SpO2: 100% 99%   Vitals:   12/03/22 2020 12/03/22 2313 12/04/22 0351 12/04/22 0830  BP: (!) 141/55 (!) 128/56 (!)  157/58 (!) 181/63  Pulse: 69 62 61 72  Resp: '18 18 18 18  '$ Temp: 97.7 F (36.5 C) 97.8 F (36.6 C) 97.7 F (36.5 C) 98.2 F (36.8 C)  TempSrc: Oral Oral Oral Oral  SpO2: 100% 97% 100% 99%  Weight:      Height:        General: Pt is alert, awake, not in acute distress Cardiovascular: RRR, S1/S2 +, no rubs, no gallops Respiratory: CTA bilaterally, no wheezing, no rhonchi Abdominal: Soft, NT, ND, bowel sounds + Extremities: no edema, no cyanosis    The results of significant diagnostics from this hospitalization (including imaging, microbiology, ancillary and laboratory) are listed below for reference.     Microbiology: Recent Results (from the past 240 hour(s))  Resp panel by RT-PCR (RSV, Flu A&B, Covid) Anterior Nasal Swab     Status: None   Collection Time: 11/27/22  6:20 PM   Specimen: Anterior Nasal Swab  Result Value Ref Range Status   SARS Coronavirus 2 by RT PCR NEGATIVE NEGATIVE Final    Comment: (NOTE) SARS-CoV-2 target nucleic acids are NOT DETECTED.  The SARS-CoV-2 RNA is generally detectable in upper respiratory specimens during the acute phase of infection. The lowest concentration of SARS-CoV-2 viral copies this assay can detect is 138 copies/mL. A negative result does not preclude SARS-Cov-2 infection and should not be used as the sole basis for treatment or other patient management decisions. A negative result may occur with  improper specimen collection/handling, submission of specimen other than nasopharyngeal swab, presence of viral mutation(s) within the areas targeted by this assay, and inadequate number of viral copies(<138 copies/mL). A negative result must be combined with clinical observations, patient history, and epidemiological information. The expected result is Negative.  Fact Sheet for Patients:  EntrepreneurPulse.com.au  Fact Sheet for Healthcare Providers:  IncredibleEmployment.be  This test is no t  yet approved or cleared by the [place] FDA and  has been authorized for detection and/or diagnosis of SARS-CoV-2 by FDA under an Emergency Use Authorization (EUA). This EUA will remain  in effect (meaning this test can be used) for the duration of the COVID-19 declaration under Section 564(b)(1) of the Act, 21 U.S.C.section 360bbb-3(b)(1), unless the authorization is terminated  or revoked sooner.       Influenza A by PCR NEGATIVE NEGATIVE Final   Influenza B by PCR NEGATIVE  NEGATIVE Final    Comment: (NOTE) The Xpert Xpress SARS-CoV-2/FLU/RSV plus assay is intended as an aid in the diagnosis of influenza from Nasopharyngeal swab specimens and should not be used as a sole basis for treatment. Nasal washings and aspirates are unacceptable for Xpert Xpress SARS-CoV-2/FLU/RSV testing.  Fact Sheet for Patients: EntrepreneurPulse.com.au  Fact Sheet for Healthcare Providers: IncredibleEmployment.be  This test is not yet approved or cleared by the [place] FDA and has been authorized for detection and/or diagnosis of SARS-CoV-2 by FDA under an Emergency Use Authorization (EUA). This EUA will remain in effect (meaning this test can be used) for the duration of the COVID-19 declaration under Section 564(b)(1) of the Act, 21 U.S.C. section 360bbb-3(b)(1), unless the authorization is terminated or revoked.     Resp Syncytial Virus by PCR NEGATIVE NEGATIVE Final    Comment: (NOTE) Fact Sheet for Patients: EntrepreneurPulse.com.au  Fact Sheet for Healthcare Providers: IncredibleEmployment.be  This test is not yet approved or cleared by the [place] FDA and has been authorized for detection and/or diagnosis of SARS-CoV-2 by FDA under an Emergency Use Authorization (EUA). This EUA will remain in effect (meaning this test can be used) for the duration of the COVID-19 declaration under Section 564(b)(1)  of the Act, 21 U.S.C. section 360bbb-3(b)(1), unless the authorization is terminated or revoked.  Performed at [facility], [address] [place], [place] [address]      Labs: BNP (last 3 results) No results for input(s): "BNP" in the last 8760 hours. Basic Metabolic Panel: Recent Labs  Lab 11/28/22 1355 11/29/22 0759 11/30/22 0511 12/01/22 0744 12/02/22 0418  NA 135 136 132* 132* 133*  K 3.7 3.7 4.1 4.0 3.9  CL 101 102 99 100 98  CO2 '25 22 26 25 25  '$ GLUCOSE 220* 170* 254* 194* 185*  BUN '16 21 22 18 17  '$ CREATININE 0.66 0.79 0.83 0.69 0.76  CALCIUM 9.2 9.3 9.3 9.3 9.2  MG 1.4*  --   --   --   --    Liver Function Tests: Recent Labs  Lab 11/27/22 1756 11/28/22 1355 11/29/22 0759  AST '23 22 21  '$ ALT '20 18 17  '$ ALKPHOS 94 70 64  BILITOT 0.6 0.8 1.2  PROT 6.1* 5.8* 5.7*  ALBUMIN 3.5 3.4* 3.3*   No results for input(s): "LIPASE", "AMYLASE" in the last 168 hours. No results for input(s): "AMMONIA" in the last 168 hours. CBC: Recent Labs  Lab 11/27/22 1756 11/28/22 1355 11/29/22 0759 11/30/22 0511 12/01/22 0744 12/02/22 0418  WBC 7.2 6.2 6.2 6.8 5.8 6.1  NEUTROABS 4.5  --   --   --   --   --   HGB 11.2* 12.8 12.8 12.5 12.8 12.7  HCT 33.3* 37.9 39.5 36.5 37.6 37.5  MCV 88.1 86.7 89.4 87.5 87.9 87.0  PLT 232 242 244 228 241 239   Cardiac Enzymes: No results for input(s): "CKTOTAL", "CKMB", "CKMBINDEX", "TROPONINI" in the last 168 hours. BNP: Invalid input(s): "POCBNP" CBG: Recent Labs  Lab 12/03/22 1147 12/03/22 1631 12/03/22 2140 12/04/22 0603 12/04/22 0821  GLUCAP 196* 248* 225* 181* 209*   D-Dimer No results for input(s): "DDIMER" in the last 72 hours. Hgb A1c No results for input(s): "HGBA1C" in the last 72 hours. Lipid Profile No results for input(s): "CHOL", "HDL", "LDLCALC", "TRIG", "CHOLHDL", "LDLDIRECT" in the last 72 hours. Thyroid function studies No results for input(s): "TSH", "T4TOTAL", "T3FREE", "THYROIDAB" in the last  72 hours.  Invalid input(s): "FREET3"  Anemia work up No results for input(s): "VITAMINB12", "FOLATE", "FERRITIN", "TIBC", "IRON", "RETICCTPCT" in the last 72 hours. Urinalysis    Component Value Date/Time   COLORURINE YELLOW 11/27/2022 1813   APPEARANCEUR CLEAR 11/27/2022 1813   LABSPEC 1.015 11/27/2022 1813   PHURINE 5.5 11/27/2022 1813   GLUCOSEU NEGATIVE 11/27/2022 1813   HGBUR NEGATIVE 11/27/2022 1813   BILIRUBINUR NEGATIVE 11/27/2022 1813   KETONESUR NEGATIVE 11/27/2022 1813   PROTEINUR NEGATIVE 11/27/2022 1813   NITRITE NEGATIVE 11/27/2022 1813   LEUKOCYTESUR SMALL (A) 11/27/2022 1813   Sepsis Labs Recent Labs  Lab 11/29/22 0759 11/30/22 0511 12/01/22 0744 12/02/22 0418  WBC 6.2 6.8 5.8 6.1   Microbiology Recent Results (from the past 240 hour(s))  Resp panel by RT-PCR (RSV, Flu A&B, Covid) Anterior Nasal Swab     Status: None   Collection Time: 11/27/22  6:20 PM   Specimen: Anterior Nasal Swab  Result Value Ref Range Status   SARS Coronavirus 2 by RT PCR NEGATIVE NEGATIVE Final    Comment: (NOTE) SARS-CoV-2 target nucleic acids are NOT DETECTED.  The SARS-CoV-2 RNA is generally detectable in upper respiratory specimens during the acute phase of infection. The lowest concentration of SARS-CoV-2 viral copies this assay can detect is 138 copies/mL. A negative result does not preclude SARS-Cov-2 infection and should not be used as the sole basis for treatment or other patient management decisions. A negative result may occur with  improper specimen collection/handling, submission of specimen other than nasopharyngeal swab, presence of viral mutation(s) within the areas targeted by this assay, and inadequate number of viral copies(<138 copies/mL). A negative result must be combined with clinical observations, patient history, and epidemiological information. The expected result is Negative.  Fact Sheet for Patients:   EntrepreneurPulse.com.au  Fact Sheet for Healthcare Providers:  IncredibleEmployment.be  This test is no t yet approved or cleared by the [place] FDA and  has been authorized for detection and/or diagnosis of SARS-CoV-2 by FDA under an Emergency Use Authorization (EUA). This EUA will remain  in effect (meaning this test can be used) for the duration of the COVID-19 declaration under Section 564(b)(1) of the Act, 21 U.S.C.section 360bbb-3(b)(1), unless the authorization is terminated  or revoked sooner.       Influenza A by PCR NEGATIVE NEGATIVE Final   Influenza B by PCR NEGATIVE NEGATIVE Final    Comment: (NOTE) The Xpert Xpress SARS-CoV-2/FLU/RSV plus assay is intended as an aid in the diagnosis of influenza from Nasopharyngeal swab specimens and should not be used as a sole basis for treatment. Nasal washings and aspirates are unacceptable for Xpert Xpress SARS-CoV-2/FLU/RSV testing.  Fact Sheet for Patients: EntrepreneurPulse.com.au  Fact Sheet for Healthcare Providers: IncredibleEmployment.be  This test is not yet approved or cleared by the [place] FDA and has been authorized for detection and/or diagnosis of SARS-CoV-2 by FDA under an Emergency Use Authorization (EUA). This EUA will remain in effect (meaning this test can be used) for the duration of the COVID-19 declaration under Section 564(b)(1) of the Act, 21 U.S.C. section 360bbb-3(b)(1), unless the authorization is terminated or revoked.     Resp Syncytial Virus by PCR NEGATIVE NEGATIVE Final    Comment: (NOTE) Fact Sheet for Patients: EntrepreneurPulse.com.au  Fact Sheet for Healthcare Providers: IncredibleEmployment.be  This test is not yet approved or cleared by the [place] FDA and has been authorized for detection and/or diagnosis of SARS-CoV-2 by FDA under an Emergency Use  Authorization (EUA). This EUA will remain in effect (  meaning this test can be used) for the duration of the COVID-19 declaration under Section 564(b)(1) of the Act, 21 U.S.C. section 360bbb-3(b)(1), unless the authorization is terminated or revoked.  Performed at [facility], [address] [place], [place] [address]      Time coordinating discharge: Over 30 minutes  SIGNED:   Little Ishikawa, DO [facility] 12/04/2022, 10:12 AM Pager   If 7PM-7AM, please contact night-coverage [email or web address]

## 2023-02-15 ENCOUNTER — Emergency Department (HOSPITAL_COMMUNITY): Payer: Medicare Other

## 2023-02-15 ENCOUNTER — Emergency Department (HOSPITAL_COMMUNITY)
Admission: EM | Admit: 2023-02-15 | Discharge: 2023-02-15 | Disposition: A | Discharge status: Home / Self Care | Payer: Medicare Other | Attending: Emergency Medicine | Admitting: Emergency Medicine

## 2023-02-15 ENCOUNTER — Other Ambulatory Visit: Payer: Self-pay

## 2023-02-15 ENCOUNTER — Encounter (HOSPITAL_COMMUNITY): Payer: Self-pay

## 2023-02-15 DIAGNOSIS — W182XXA Fall in (into) shower or empty bathtub, initial encounter: Secondary | ICD-10-CM | POA: Diagnosis not present

## 2023-02-15 DIAGNOSIS — S0093XA Contusion of unspecified part of head, initial encounter: Secondary | ICD-10-CM | POA: Insufficient documentation

## 2023-02-15 DIAGNOSIS — S5001XA Contusion of right elbow, initial encounter: Secondary | ICD-10-CM | POA: Diagnosis not present

## 2023-02-15 DIAGNOSIS — S0990XA Unspecified injury of head, initial encounter: Secondary | ICD-10-CM

## 2023-02-15 DIAGNOSIS — S50311A Abrasion of right elbow, initial encounter: Secondary | ICD-10-CM

## 2023-02-15 DIAGNOSIS — Z7982 Long term (current) use of aspirin: Secondary | ICD-10-CM | POA: Diagnosis not present

## 2023-02-15 DIAGNOSIS — W19XXXA Unspecified fall, initial encounter: Secondary | ICD-10-CM

## 2023-02-15 LAB — COMPREHENSIVE METABOLIC PANEL
ALT: 27 U/L (ref 0–44)
AST: 30 U/L (ref 15–41)
Albumin: 4 g/dL (ref 3.5–5.0)
Alkaline Phosphatase: 113 U/L (ref 38–126)
Anion gap: 8 (ref 5–15)
BUN: 21 mg/dL (ref 8–23)
CO2: 26 mmol/L (ref 22–32)
Calcium: 9.6 mg/dL (ref 8.9–10.3)
Chloride: 101 mmol/L (ref 98–111)
Creatinine, Ser: 0.81 mg/dL (ref 0.44–1.00)
GFR, Estimated: >60 mL/min (ref >60)
Glucose, Bld: 161 mg/dL — ABNORMAL HIGH (ref 70–99)
Potassium: 4 mmol/L (ref 3.5–5.1)
Sodium: 135 mmol/L (ref 135–145)
Total Bilirubin: 0.4 mg/dL (ref 0.3–1.2)
Total Protein: 6.8 g/dL (ref 6.5–8.1)

## 2023-02-15 LAB — CBC WITH DIFFERENTIAL/PLATELET
Abs Immature Granulocytes: 0.08 10*3/uL — ABNORMAL HIGH (ref 0.00–0.07)
Basophils Absolute: 0 10*3/uL (ref 0.0–0.1)
Basophils Relative: 0 %
Eosinophils Absolute: 0.1 10*3/uL (ref 0.0–0.5)
Eosinophils Relative: 1 %
HCT: 40.4 % (ref 36.0–46.0)
Hemoglobin: 13.4 g/dL (ref 12.0–15.0)
Immature Granulocytes: 1 %
Lymphocytes Relative: 14 %
Lymphs Abs: 1.7 10*3/uL (ref 0.7–4.0)
MCH: 29 pg (ref 26.0–34.0)
MCHC: 33.2 g/dL (ref 30.0–36.0)
MCV: 87.4 fL (ref 80.0–100.0)
Monocytes Absolute: 0.8 10*3/uL (ref 0.1–1.0)
Monocytes Relative: 6 %
Neutro Abs: 9.4 10*3/uL — ABNORMAL HIGH (ref 1.7–7.7)
Neutrophils Relative %: 78 %
Platelets: 293 10*3/uL (ref 150–400)
RBC: 4.62 MIL/uL (ref 3.87–5.11)
RDW: 14 % (ref 11.5–15.5)
WBC: 12.1 10*3/uL — ABNORMAL HIGH (ref 4.0–10.5)
nRBC: 0 % (ref 0.0–0.2)

## 2023-02-15 LAB — URINALYSIS, ROUTINE W REFLEX MICROSCOPIC
Bilirubin Urine: NEGATIVE
Glucose, UA: NEGATIVE mg/dL
Hgb urine dipstick: NEGATIVE
Ketones, ur: NEGATIVE mg/dL
Leukocytes,Ua: NEGATIVE
Nitrite: NEGATIVE
Protein, ur: NEGATIVE mg/dL
Specific Gravity, Urine: 1.004 — ABNORMAL LOW (ref 1.005–1.030)
pH: 7 (ref 5.0–8.0)

## 2023-02-15 MED ORDER — LIDOCAINE-EPINEPHRINE 1 %-1:100000 IJ SOLN
10.0000 mL | Freq: Once | INTRAMUSCULAR | Status: AC
  Administered 2023-02-15: 10 mL via INTRADERMAL
  Filled 2023-02-15: qty 1

## 2023-02-15 NOTE — Discharge Instructions (Addendum)
1.  You have wound on the right elbow.  X-rays do not show any fracture.  Keep a clean dry dressing with antibiotic ointment over it. 2.  You had a CT scan on your head and neck.  There is no signs of a bleeding injury in the head.  Follow head injury instructions and return if there are any changes or concerning symptoms.  You may take extra strength Tylenol every 6 hours for pain. 3.  On any bruised or swollen areas apply ice packs and elevate if possible. 4.  Return if any new or concerning findings.

## 2023-02-15 NOTE — ED Triage Notes (Signed)
Pt BIB [facility] for fall in bathroom from [facility] [address]  Mechanical fall but pt endorses possible LOC.  Pt has hematoma to back of head and a lac with controlled bleeding to right elbow.  Pt complains of no other injuries or pain.   154/66 94% HR 73 CBG 168

## 2023-02-15 NOTE — ED Notes (Signed)
Patient verbalizes understanding of discharge instructions. Opportunity for questioning and answers were provided. Pt discharged from ED. 

## 2023-02-15 NOTE — ED Provider Notes (Signed)
[place] EMERGENCY DEPARTMENT AT [facility] Provider Note   CSN: [id number] Arrival date & time: 02/15/23  1242     History  Chief Complaint  Patient presents with   Fall   Loss of Consciousness   Head Injury    Kiara Blair is a 89 y.o. female.  HPI Reports that she was in the bathroom at [facility] and lost her balance.  She reports she fell backwards and hit her head and also her right elbow.  She reports that she has a little bit of pain in the back of her head.  She has not had any nausea or vomiting.  She denies that she feels like she is specifically weak with her arms or legs.  Reports her right elbow is painful but she can move it.  She is not anticoagulated.    Home Medications Prior to Admission medications   Medication Sig Start Date End Date Taking? Authorizing Provider  acetaminophen (TYLENOL) 325 MG tablet Take 2 tablets (650 mg total) by mouth every 6 (six) hours as needed. Patient taking differently: Take 650 mg by mouth as needed for moderate pain. 12/06/21   Sloan Leiter, DO  Alogliptin-metFORMIN HCl (KAZANO) 12.02-999 MG TABS Take 1 tablet by mouth in the morning and at bedtime.    [provider]  amLODipine-benazepril (LOTREL) 5-20 MG per capsule Take 1 capsule by mouth at bedtime.    [provider]  aspirin EC 81 MG tablet Take 1 tablet (81 mg total) by mouth daily. Swallow whole. 12/05/22   Azucena Fallen, MD  ASPIRIN LOW DOSE 81 MG tablet Take 81 mg by mouth daily. 02/14/23   [provider]  atorvastatin (LIPITOR) 40 MG tablet Take 1 tablet (40 mg total) by mouth daily. 02/08/21   Ihor Austin, NP  Cholecalciferol (VITAMIN D3) 50 MCG (2000 UT) TABS Take 1 tablet by mouth daily. 01/01/23   [provider]  escitalopram (LEXAPRO) 5 MG tablet Take 5 mg by mouth daily. 01/26/23   [provider]  hydrOXYzine (ATARAX) 10 MG tablet Take 10 mg by mouth 2 (two) times daily. 12/04/22   [provider]  lidocaine (XYLOCAINE) 5 % ointment Apply 1 Application topically 4 (four) times daily as needed for moderate pain. Patient taking differently: Apply 1 Application topically as needed for moderate pain. 07/31/22   Long, Arlyss Repress, MD  melatonin 3 MG TABS tablet Take 3 mg by mouth at bedtime. 01/15/23   [provider]  Multiple Vitamin (MULTIVITAMIN WITH MINERALS) TABS tablet Take 1 tablet by mouth in the morning and at bedtime. Dr. Linton Flemings Vitamins.    [provider]  NOVOLOG FLEXPEN 100 UNIT/ML FlexPen Inject 4 Units into the skin in the morning, at noon, and at bedtime. 01/08/23   [provider]  PAIN RELIEF MAXIMUM STRENGTH 4 % Place 1 patch onto the skin daily. 12/22/22   [provider]  propranolol (INDERAL) 10 MG tablet Take 10 mg by mouth 2 (two) times daily. 01/05/21   [provider]      Allergies    Doxycycline hyclate, Levofloxacin, Meloxicam, Naproxen-esomeprazole mg, Nitrofurantoin, Other, and Pneumococcal polysaccharide vaccine    Review of Systems   Review of Systems  Physical Exam Updated Vital Signs BP (!) 146/121   Pulse 72   Temp 98 F (36.7 C)   Ht 5\' 1"  (1.549 m)   Wt 51.7 kg   SpO2 97%   BMI 21.54 kg/m  Physical Exam Constitutional:      Comments: Patient is alert.  GCS 15.  Answering all questions appropriately.  HENT:     Head:     Comments: Tender area with hematoma to the back of the head but no bleeding.  No facial trauma.    Mouth/Throat:     Pharynx: Oropharynx is clear.  Eyes:     Extraocular Movements: Extraocular movements intact.  Cardiovascular:     Rate and Rhythm: Normal rate and regular rhythm.  Pulmonary:     Effort: Pulmonary effort is normal.     Breath sounds: Normal breath sounds.  Chest:     Chest wall: No tenderness.  Abdominal:     General: There is no distension.     Palpations: Abdomen is soft.     Tenderness: There is no abdominal tenderness. There is no guarding.   Musculoskeletal:     Comments: Mild swelling to the olecranon of the right elbow.  About 2 cm skin flap. Intact range of motion.  Skin:    General: Skin is warm and dry.  Neurological:     General: No focal deficit present.     Mental Status: She is oriented to person, place, and time.     Motor: No weakness.     Coordination: Coordination normal.  Psychiatric:        Mood and Affect: Mood normal.     ED Results / Procedures / Treatments   Labs (all labs ordered are listed, but only abnormal results are displayed) Labs Reviewed  COMPREHENSIVE METABOLIC PANEL - Abnormal; Notable for the following components:      Result Value   Glucose, Bld 161 (*)    All other components within normal limits  CBC WITH DIFFERENTIAL/PLATELET - Abnormal; Notable for the following components:   WBC 12.1 (*)    Neutro Abs 9.4 (*)    Abs Immature Granulocytes 0.08 (*)    All other components within normal limits  URINALYSIS, ROUTINE W REFLEX MICROSCOPIC - Abnormal; Notable for the following components:   Color, Urine STRAW (*)    Specific Gravity, Urine 1.004 (*)    All other components within normal limits    EKG None  Radiology DG Pelvis 1-2 Views  Result Date: 02/15/2023 CLINICAL DATA:  Fall EXAM: PELVIS - 1-2 VIEW COMPARISON:  12/05/2021 FINDINGS: SI joints are non widened. Pubic symphysis and rami appear grossly intact. No definitive fracture or malalignment. Mild bilateral hip degenerative change. Vascular calcifications IMPRESSION: No acute osseous abnormality. Cross-sectional imaging follow-up if continued concern for acute fracture Electronically Signed   By: Jasmine Pang M.D.   On: 02/15/2023 15:57   DG Chest 1 View  Result Date: 02/15/2023 CLINICAL DATA:  Fall EXAM: CHEST  1 VIEW COMPARISON:  11/27/2022 FINDINGS: Stable cardiomediastinal silhouette with aortic atherosclerosis. No acute airspace disease, pleural effusion or convincing pneumothorax. Probable skin fold artifacts over the  right chest. IMPRESSION: No active disease. Probable skin fold artifacts over the right chest. Electronically Signed   By: Jasmine Pang M.D.   On: 02/15/2023 15:56   DG Elbow Complete Right  Result Date: 02/15/2023 CLINICAL DATA:  Fall EXAM: RIGHT ELBOW - COMPLETE 3+ VIEW COMPARISON:  11/07/2022 FINDINGS: There is no evidence of fracture, dislocation, or joint effusion. There is no evidence of arthropathy or other focal bone abnormality. Soft tissues are unremarkable. IMPRESSION: Negative. Electronically Signed   By: Jasmine Pang M.D.   On: 02/15/2023 15:55   CT Head Wo Contrast  Result Date: 02/15/2023 CLINICAL DATA:  Head trauma, minor (Age >= 65y); Neck trauma (Age >= 65y). EXAM: CT HEAD WITHOUT CONTRAST CT CERVICAL SPINE WITHOUT CONTRAST TECHNIQUE: Multidetector CT imaging of the head and cervical spine was performed following the standard protocol without intravenous contrast. Multiplanar CT image reconstructions of the cervical spine were also generated. RADIATION DOSE REDUCTION: This exam was performed according to the departmental dose-optimization program which includes automated exposure control, adjustment of the mA and/or kV according to patient size and/or use of iterative reconstruction technique. COMPARISON:  Head CT 11/27/2022.  Cervical spine CT 11/07/2022. FINDINGS: CT HEAD FINDINGS Brain: No acute hemorrhage. Unchanged mild chronic small-vessel disease. Cortical gray-white differentiation is otherwise preserved. Prominence of the ventricles and sulci within expected range for age. No hydrocephalus or extra-axial collection. No mass effect or midline shift. Vascular: No hyperdense vessel or unexpected calcification. Skull: No calvarial fracture or suspicious bone lesion. Skull base is unremarkable. Sinuses/Orbits: Unremarkable. Other: Vertex scalp hematoma. CT CERVICAL SPINE FINDINGS Alignment: Unchanged 3 mm degenerative anterolisthesis of C3 on C4. No traumatic malalignment. Skull base  and vertebrae: No acute fracture. Normal craniocervical junction. No suspicious bone lesions. Soft tissues and spinal canal: No prevertebral fluid or swelling. No visible canal hematoma. Disc levels: Mild cervical spondylosis without high-grade spinal canal stenosis. Upper chest: Unremarkable. Other: Unchanged heterogeneous enlargement of the right thyroid lobe, previously evaluated by ultrasound. IMPRESSION: 1. No acute intracranial abnormality. Vertex scalp hematoma without underlying calvarial fracture. 2. No acute cervical spine fracture or traumatic malalignment. Electronically Signed   By: Orvan Falconer M.D.   On: 02/15/2023 15:41   CT Cervical Spine Wo Contrast  Result Date: 02/15/2023 CLINICAL DATA:  Head trauma, minor (Age >= 65y); Neck trauma (Age >= 65y). EXAM: CT HEAD WITHOUT CONTRAST CT CERVICAL SPINE WITHOUT CONTRAST TECHNIQUE: Multidetector CT imaging of the head and cervical spine was performed following the standard protocol without intravenous contrast. Multiplanar CT image reconstructions of the cervical spine were also generated. RADIATION DOSE REDUCTION: This exam was performed according to the departmental dose-optimization program which includes automated exposure control, adjustment of the mA and/or kV according to patient size and/or use of iterative reconstruction technique. COMPARISON:  Head CT 11/27/2022.  Cervical spine CT 11/07/2022. FINDINGS: CT HEAD FINDINGS Brain: No acute hemorrhage. Unchanged mild chronic small-vessel disease. Cortical gray-white differentiation is otherwise preserved. Prominence of the ventricles and sulci within expected range for age. No hydrocephalus or extra-axial collection. No mass effect or midline shift. Vascular: No hyperdense vessel or unexpected calcification. Skull: No calvarial fracture or suspicious bone lesion. Skull base is unremarkable. Sinuses/Orbits: Unremarkable. Other: Vertex scalp hematoma. CT CERVICAL SPINE FINDINGS Alignment: Unchanged  3 mm degenerative anterolisthesis of C3 on C4. No traumatic malalignment. Skull base and vertebrae: No acute fracture. Normal craniocervical junction. No suspicious bone lesions. Soft tissues and spinal canal: No prevertebral fluid or swelling. No visible canal hematoma. Disc levels: Mild cervical spondylosis without high-grade spinal canal stenosis. Upper chest: Unremarkable. Other: Unchanged heterogeneous enlargement of the right thyroid lobe, previously evaluated by ultrasound. IMPRESSION: 1. No acute intracranial abnormality. Vertex scalp hematoma without underlying calvarial fracture. 2. No acute cervical spine fracture or traumatic malalignment. Electronically Signed   By: Orvan Falconer M.D.   On: 02/15/2023 15:41    Procedures Procedures    Medications Ordered in ED Medications  lidocaine-EPINEPHrine (XYLOCAINE W/EPI) 1 %-1:100000 (with pres) injection 10 mL (has no administration in time range)    ED Course/ Medical Decision Making/ A&P  Medical Decision Making Amount and/or Complexity of Data Reviewed Labs: ordered. Radiology: ordered.   Patient reports losing her balance and having a mechanical fall.  She does have a hematoma to the back of the head and a painful right elbow.  Will proceed with CT scan head and C-spine as well as x-rays on the elbow.  Fall will also obtain chest x-ray and pelvis.  Patient's mental status is clear.  Vital signs are stable.  CT head and C-spine interpreted by radiology no acute intracranial injury or acute fractures.  Film x-rays reviewed by radiology no acute fractures of the elbow, no identified rib fractures or pelvic ring fractures.  Reassessed.  She remains alert and appropriate.  This time with no acute injuries will treat superficial soft tissue injuries.  Upon reexamination elbow has a more open laceration.  Dr. Earlene Plater to assist and place sutures as needed.  Separately dictated note.  Patient son is at bedside.   I reviewed results.  She has good supportive care at [facility] and at this time stable for discharge.        Final Clinical Impression(s) / ED Diagnoses Final diagnoses:  Fall, initial encounter  Injury of head, initial encounter  Contusion of right elbow, initial encounter  Abrasion of right elbow, initial encounter    Rx / DC Orders ED Discharge Orders     None         Arby Barrette, MD 02/15/23 1720

## 2023-04-01 ENCOUNTER — Emergency Department (HOSPITAL_COMMUNITY): Payer: Medicare Other

## 2023-04-01 ENCOUNTER — Encounter (HOSPITAL_COMMUNITY): Payer: Self-pay

## 2023-04-01 ENCOUNTER — Emergency Department (HOSPITAL_COMMUNITY)
Admission: EM | Admit: 2023-04-01 | Discharge: 2023-04-01 | Disposition: A | Discharge status: Home / Self Care | Payer: Medicare Other | Attending: Emergency Medicine | Admitting: Emergency Medicine

## 2023-04-01 DIAGNOSIS — E119 Type 2 diabetes mellitus without complications: Secondary | ICD-10-CM | POA: Diagnosis not present

## 2023-04-01 DIAGNOSIS — R0789 Other chest pain: Secondary | ICD-10-CM | POA: Diagnosis not present

## 2023-04-01 DIAGNOSIS — I10 Essential (primary) hypertension: Secondary | ICD-10-CM | POA: Diagnosis not present

## 2023-04-01 DIAGNOSIS — W228XXA Striking against or struck by other objects, initial encounter: Secondary | ICD-10-CM | POA: Insufficient documentation

## 2023-04-01 DIAGNOSIS — Y9301 Activity, walking, marching and hiking: Secondary | ICD-10-CM | POA: Diagnosis not present

## 2023-04-01 DIAGNOSIS — S0093XA Contusion of unspecified part of head, initial encounter: Secondary | ICD-10-CM | POA: Insufficient documentation

## 2023-04-01 DIAGNOSIS — Z794 Long term (current) use of insulin: Secondary | ICD-10-CM | POA: Diagnosis not present

## 2023-04-01 DIAGNOSIS — W19XXXA Unspecified fall, initial encounter: Secondary | ICD-10-CM

## 2023-04-01 DIAGNOSIS — Z7982 Long term (current) use of aspirin: Secondary | ICD-10-CM | POA: Diagnosis not present

## 2023-04-01 DIAGNOSIS — Z79899 Other long term (current) drug therapy: Secondary | ICD-10-CM | POA: Insufficient documentation

## 2023-04-01 DIAGNOSIS — R519 Headache, unspecified: Secondary | ICD-10-CM | POA: Diagnosis present

## 2023-04-01 NOTE — ED Triage Notes (Signed)
Unwitnessed fall coming from [facility],  Has Hx of alzheimers, and only oriented to self, Kiara Blair is aware of transport to the hospital. Patient has a small hematoma on back of head, is not on blood thinners, no tenderness in C-spine/thoracic/lumbar. Bilateral skin tears on forearms.   Medic Vitals  118/54 66hr 94% ra Bgl 236 16rr

## 2023-04-01 NOTE — Discharge Instructions (Addendum)
You were seen in the ER today for your fall. Your imaging was reassuring. Follow up with your PCP.

## 2023-04-01 NOTE — ED Provider Notes (Signed)
[place] [facility]MENT AT [facility] Provider Note   CSN: [id number] Arrival date & time: 04/01/23  4098     History  Chief Complaint  Patient presents with   Kiara Blair is a 89 y.o. female who presents to [facility] with history of Alzheimer's following unwitnessed fall.  Patient states that she was walking back to her room when she caught her foot on the door threshold and fell.  Denies loss of consciousness.  Endorses mild headache but otherwise no pain.  Bilateral forearm skin tears.  I personally reviewed her medical records.  History of type 2 diabetes, history of CVA, GERD, hypertension.  Patient is not anticoagulated.  HPI     Home Medications Prior to Admission medications   Medication Sig Start Date End Date Taking? Authorizing Provider  acetaminophen (TYLENOL) 325 MG tablet Take 2 tablets (650 mg total) by mouth every 6 (six) hours as needed. Patient taking differently: Take 650 mg by mouth as needed for moderate pain. 12/06/21   Sloan Leiter, DO  Alogliptin-metFORMIN HCl (KAZANO) 12.02-999 MG TABS Take 1 tablet by mouth in the morning and at bedtime.    [provider]  amLODipine-benazepril (LOTREL) 5-20 MG per capsule Take 1 capsule by mouth at bedtime.    [provider]  aspirin EC 81 MG tablet Take 1 tablet (81 mg total) by mouth daily. Swallow whole. 12/05/22   Azucena Fallen, MD  ASPIRIN LOW DOSE 81 MG tablet Take 81 mg by mouth daily. 02/14/23   [provider]  atorvastatin (LIPITOR) 40 MG tablet Take 1 tablet (40 mg total) by mouth daily. 02/08/21   Ihor Austin, NP  Cholecalciferol (VITAMIN D3) 50 MCG (2000 UT) TABS Take 1 tablet by mouth daily. 01/01/23   [provider]  escitalopram (LEXAPRO) 5 MG tablet Take 5 mg by mouth daily. 01/26/23   [provider]  hydrOXYzine (ATARAX) 10 MG tablet Take 10 mg by mouth 2 (two) times daily. 12/04/22   [provider]   lidocaine (XYLOCAINE) 5 % ointment Apply 1 Application topically 4 (four) times daily as needed for moderate pain. Patient taking differently: Apply 1 Application topically as needed for moderate pain. 07/31/22   Long, Arlyss Repress, MD  melatonin 3 MG TABS tablet Take 3 mg by mouth at bedtime. 01/15/23   [provider]  Multiple Vitamin (MULTIVITAMIN WITH MINERALS) TABS tablet Take 1 tablet by mouth in the morning and at bedtime. Dr. Linton Flemings Vitamins.    [provider]  NOVOLOG FLEXPEN 100 UNIT/ML FlexPen Inject 4 Units into the skin in the morning, at noon, and at bedtime. 01/08/23   [provider]  PAIN RELIEF MAXIMUM STRENGTH 4 % Place 1 patch onto the skin daily. 12/22/22   [provider]  propranolol (INDERAL) 10 MG tablet Take 10 mg by mouth 2 (two) times daily. 01/05/21   [provider]      Allergies    Doxycycline hyclate, Levofloxacin, Meloxicam, Naproxen-esomeprazole mg, Nitrofurantoin, Other, and Pneumococcal polysaccharide vaccine    Review of Systems   Review of Systems  Eyes: Negative.   Skin:  Positive for wound.  Neurological:  Positive for headaches. Negative for dizziness and light-headedness.    Physical Exam Updated Vital Signs BP (!) 128/58   Pulse 61   Temp (!) 97.5 F (36.4 C)   Resp 18   SpO2 100%  Physical Exam Vitals and nursing note reviewed.  Constitutional:      Appearance: She is not ill-appearing or toxic-appearing.  HENT:     Head: Normocephalic. No raccoon eyes or Battle's sign.      Mouth/Throat:     Mouth: Mucous membranes are moist.     Pharynx: No oropharyngeal exudate or posterior oropharyngeal erythema.  Eyes:     General: Lids are normal. Vision grossly intact.        Right eye: No discharge.        Left eye: No discharge.     Extraocular Movements: Extraocular movements intact.     Conjunctiva/sclera: Conjunctivae normal.     Pupils: Pupils are equal, round, and reactive to light.  Neck:      Trachea: Trachea and phonation normal.  Cardiovascular:     Rate and Rhythm: Normal rate and regular rhythm.     Pulses: Normal pulses.     Heart sounds: Normal heart sounds.  Pulmonary:     Effort: Pulmonary effort is normal. No respiratory distress.     Breath sounds: Normal breath sounds. No wheezing or rales.  Chest:     Chest wall: Tenderness present. No mass, lacerations, deformity, swelling, crepitus or edema.     Comments: No bruising or crepitus over the chest wall though there is mild tenderness palpation over the left anterior chest wall. Abdominal:     General: There is no distension.     Palpations: Abdomen is soft.     Tenderness: There is no abdominal tenderness.  Musculoskeletal:        General: No deformity.     Cervical back: Normal range of motion and neck supple. No spinous process tenderness or muscular tenderness.     Right lower leg: No edema.     Left lower leg: No edema.  Lymphadenopathy:     Cervical: No cervical adenopathy.  Skin:    General: Skin is warm and dry.     Capillary Refill: Capillary refill takes less than 2 seconds.     Findings: Wound present.       Neurological:     General: No focal deficit present.     Mental Status: She is alert and oriented to person, place, and time. Mental status is at baseline.     GCS: GCS eye subscore is 4. GCS verbal subscore is 5. GCS motor subscore is 6.     Sensory: Sensation is intact.     Motor: Motor function is intact.  Psychiatric:        Mood and Affect: Mood normal.     ED Results / Procedures / Treatments   Labs (all labs ordered are listed, but only abnormal results are displayed) Labs Reviewed - No data to display  EKG EKG Interpretation  Date/Time:  Sunday April 01 2023 04:25:45 EDT Ventricular Rate:  61 PR Interval:  140 QRS Duration: 106 QT Interval:  417 QTC Calculation: 420 R Axis:   56 Text Interpretation: Sinus rhythm Borderline ST elevation, lateral leads No significant  change since last tracing Confirmed by Melene Plan [phone]) on 04/01/2023 6:29:27 AM  Radiology CT Cervical Spine Wo Contrast  Result Date: 04/01/2023 CLINICAL DATA:  Neck trauma EXAM: CT CERVICAL SPINE WITHOUT CONTRAST TECHNIQUE: Multidetector CT imaging of the cervical spine was performed without intravenous contrast. Multiplanar CT image reconstructions were also generated. RADIATION DOSE REDUCTION: This exam was performed according to the departmental dose-optimization program which includes automated exposure control, adjustment of the mA and/or kV according to patient size and/or  use of iterative reconstruction technique. COMPARISON:  02/15/2023 FINDINGS: Alignment: No traumatic malalignment Skull base and vertebrae: No acute fracture or incidental bone lesion. T3 nonacute superior endplate fracture with horizontal band of sclerosis, new since prior. Advanced glenohumeral osteoarthritis on both sides with large cyst at the right humeral head. Soft tissues and spinal canal: No prevertebral fluid or swelling. No visible canal hematoma. Long-standing right thyroid nodule, previously evaluated by ultrasound in 2021. Disc levels:  Ordinary and plate and facet spurring diffusely. Upper chest: No acute finding IMPRESSION: No acute finding. Nonacute T3 superior endplate fractures since 02/15/2023 comparison. No significant height loss. Electronically Signed   By: Tiburcio Pea M.D.   On: 04/01/2023 06:14   CT Head Wo Contrast  Result Date: 04/01/2023 CLINICAL DATA:  Fall.  Head trauma. EXAM: CT HEAD WITHOUT CONTRAST TECHNIQUE: Contiguous axial images were obtained from the base of the skull through the vertex without intravenous contrast. RADIATION DOSE REDUCTION: This exam was performed according to the departmental dose-optimization program which includes automated exposure control, adjustment of the mA and/or kV according to patient size and/or use of iterative reconstruction technique. COMPARISON:   02/15/2023 FINDINGS: Brain: There is no evidence for acute hemorrhage, hydrocephalus, mass lesion, or abnormal extra-axial fluid collection. No definite CT evidence for acute infarction. Diffuse loss of parenchymal volume is consistent with atrophy. Patchy low attenuation in the deep hemispheric and periventricular white matter is nonspecific, but likely reflects chronic microvascular ischemic demyelination. Vascular: No hyperdense vessel or unexpected calcification. Skull: No evidence for fracture. No worrisome lytic or sclerotic lesion. Sinuses/Orbits: The visualized paranasal sinuses and mastoid air cells are clear. Visualized portions of the globes and intraorbital fat are unremarkable. Other: None. IMPRESSION: 1. No acute intracranial abnormality. 2. Atrophy with chronic small vessel white matter ischemic disease. Electronically Signed   By: Kennith Center M.D.   On: 04/01/2023 06:14   DG Ribs Unilateral W/Chest Left  Result Date: 04/01/2023 CLINICAL DATA:  Fall. EXAM: LEFT RIBS AND CHEST - 3 VIEW COMPARISON:  02/15/2023 FINDINGS: No fracture or other bone lesions are seen involving the ribs. There is no evidence of pneumothorax or pleural effusion. Chronically seen nipple shadow over the right chest. Both lungs are clear. Generous heart size but stable. Mild aortic tortuosity. IMPRESSION: No acute finding. Electronically Signed   By: Tiburcio Pea M.D.   On: 04/01/2023 05:39    Procedures Procedures    Medications Ordered in ED Medications - No data to display  ED Course/ Medical Decision Making/ A&P                             Medical Decision Making 89 year old female presents after unwitnessed mechanical fall at her facility.  Mildly hypertensive on intake and vital signs otherwise normal.  Cardiopulmonary exam is normal, abdominal exam is benign.  Mild left chest wall tenderness to palpation without bruising or crepitus, no deformity.  Small hematoma over the right occiput.  No  anticoagulation.  Amount and/or Complexity of Data Reviewed Radiology: ordered.    Details: CT imaging reassuring, no acute injury, though subacute endplate fracture.    Imaging reassuring, hx consistent with mechanical fall. Clinical concern for emergent underlying etiology or injury that would warrant further ED workup or inpatient management is exceedingly low.  Kiara Blair voiced understanding of her medical evaluation and treatment plan. Each of their questions answered to their expressed satisfaction.  Return precautions were given.  Patient is well-appearing, stable,  and was discharged in good condition.  This chart was dictated using voice recognition software, Dragon. Despite the best efforts of this provider to proofread and correct errors, errors may still occur which can change documentation meaning.  Final Clinical Impression(s) / ED Diagnoses Final diagnoses:  Fall, initial encounter    Rx / DC Orders ED Discharge Orders     None         Paris Lore, PA-C 04/01/23 0641    Melene Plan, DO 04/01/23 [phone]

## 2023-04-21 ENCOUNTER — Emergency Department (HOSPITAL_BASED_OUTPATIENT_CLINIC_OR_DEPARTMENT_OTHER)
Admission: EM | Admit: 2023-04-21 | Discharge: 2023-04-21 | Disposition: A | Discharge status: Home / Self Care | Payer: Medicare Other | Attending: Emergency Medicine | Admitting: Emergency Medicine

## 2023-04-21 ENCOUNTER — Emergency Department (HOSPITAL_BASED_OUTPATIENT_CLINIC_OR_DEPARTMENT_OTHER): Payer: Medicare Other

## 2023-04-21 ENCOUNTER — Other Ambulatory Visit (HOSPITAL_BASED_OUTPATIENT_CLINIC_OR_DEPARTMENT_OTHER): Payer: Self-pay

## 2023-04-21 ENCOUNTER — Encounter (HOSPITAL_BASED_OUTPATIENT_CLINIC_OR_DEPARTMENT_OTHER): Payer: Self-pay

## 2023-04-21 DIAGNOSIS — W01198A Fall on same level from slipping, tripping and stumbling with subsequent striking against other object, initial encounter: Secondary | ICD-10-CM | POA: Diagnosis not present

## 2023-04-21 DIAGNOSIS — Y92120 Kitchen in nursing home as the place of occurrence of the external cause: Secondary | ICD-10-CM | POA: Insufficient documentation

## 2023-04-21 DIAGNOSIS — Z7982 Long term (current) use of aspirin: Secondary | ICD-10-CM | POA: Insufficient documentation

## 2023-04-21 DIAGNOSIS — W19XXXA Unspecified fall, initial encounter: Secondary | ICD-10-CM

## 2023-04-21 DIAGNOSIS — Z79899 Other long term (current) drug therapy: Secondary | ICD-10-CM | POA: Diagnosis not present

## 2023-04-21 DIAGNOSIS — R519 Headache, unspecified: Secondary | ICD-10-CM | POA: Diagnosis not present

## 2023-04-21 DIAGNOSIS — S41112A Laceration without foreign body of left upper arm, initial encounter: Secondary | ICD-10-CM | POA: Insufficient documentation

## 2023-04-21 DIAGNOSIS — I10 Essential (primary) hypertension: Secondary | ICD-10-CM | POA: Diagnosis not present

## 2023-04-21 DIAGNOSIS — Z7984 Long term (current) use of oral hypoglycemic drugs: Secondary | ICD-10-CM | POA: Diagnosis not present

## 2023-04-21 DIAGNOSIS — D649 Anemia, unspecified: Secondary | ICD-10-CM | POA: Insufficient documentation

## 2023-04-21 DIAGNOSIS — E1165 Type 2 diabetes mellitus with hyperglycemia: Secondary | ICD-10-CM | POA: Insufficient documentation

## 2023-04-21 LAB — CBC WITH DIFFERENTIAL/PLATELET
Abs Immature Granulocytes: 0.05 10*3/uL (ref 0.00–0.07)
Basophils Absolute: 0 10*3/uL (ref 0.0–0.1)
Basophils Relative: 0 %
Eosinophils Absolute: 0.1 10*3/uL (ref 0.0–0.5)
Eosinophils Relative: 2 %
HCT: 34.6 % — ABNORMAL LOW (ref 36.0–46.0)
Hemoglobin: 11.2 g/dL — ABNORMAL LOW (ref 12.0–15.0)
Immature Granulocytes: 1 %
Lymphocytes Relative: 22 %
Lymphs Abs: 1.8 10*3/uL (ref 0.7–4.0)
MCH: 28.1 pg (ref 26.0–34.0)
MCHC: 32.4 g/dL (ref 30.0–36.0)
MCV: 86.9 fL (ref 80.0–100.0)
Monocytes Absolute: 0.7 10*3/uL (ref 0.1–1.0)
Monocytes Relative: 8 %
Neutro Abs: 5.5 10*3/uL (ref 1.7–7.7)
Neutrophils Relative %: 67 %
Platelets: 265 10*3/uL (ref 150–400)
RBC: 3.98 MIL/uL (ref 3.87–5.11)
RDW: 14.6 % (ref 11.5–15.5)
WBC: 8.1 10*3/uL (ref 4.0–10.5)
nRBC: 0 % (ref 0.0–0.2)

## 2023-04-21 LAB — URINALYSIS, ROUTINE W REFLEX MICROSCOPIC
Bacteria, UA: NONE SEEN
Bilirubin Urine: NEGATIVE
Glucose, UA: 250 mg/dL — AB
Hgb urine dipstick: NEGATIVE
Ketones, ur: NEGATIVE mg/dL
Nitrite: NEGATIVE
Protein, ur: NEGATIVE mg/dL
Specific Gravity, Urine: 1.01 (ref 1.005–1.030)
pH: 6.5 (ref 5.0–8.0)

## 2023-04-21 LAB — BASIC METABOLIC PANEL
Anion gap: 4 — ABNORMAL LOW (ref 5–15)
BUN: 24 mg/dL — ABNORMAL HIGH (ref 8–23)
CO2: 31 mmol/L (ref 22–32)
Calcium: 9.5 mg/dL (ref 8.9–10.3)
Chloride: 100 mmol/L (ref 98–111)
Creatinine, Ser: 0.79 mg/dL (ref 0.44–1.00)
GFR, Estimated: >60 mL/min (ref >60)
Glucose, Bld: 152 mg/dL — ABNORMAL HIGH (ref 70–99)
Potassium: 4.3 mmol/L (ref 3.5–5.1)
Sodium: 135 mmol/L (ref 135–145)

## 2023-04-21 NOTE — ED Triage Notes (Signed)
She states she remembers falling ("I don't know why I fell") at her assisted living apartment. She called for help and EMS were notified. She is awake, alert and oriented x 4 with clear speech. She c/o some neck soreness and also has a skin tear on left upper arm.

## 2023-04-21 NOTE — Progress Notes (Signed)
RT ambulated the Pt. She was able to walk to the bathroom and use the potty independently. Pt stated she likes someone to walk by her side when she walks .

## 2023-04-21 NOTE — ED Notes (Signed)
Pt and son received AVS; Educated on wound care and follow up instructions. Son verbalized understanding and had no further questions.

## 2023-04-21 NOTE — Discharge Instructions (Signed)
It was a pleasure taking care of you today.  As discussed, your labs were reassuring.  Scans did not show any abnormalities.  You may take over-the-counter Tylenol as needed for pain.  Follow-up with PCP in 2 to 3 days for a recheck.  Return to the ER for any worsening symptoms.

## 2023-04-21 NOTE — Progress Notes (Signed)
The PA said RT could take off the C Collar and raise her head up.

## 2023-04-21 NOTE — ED Notes (Signed)
Pt updated on POC; family in room, call light in reached.

## 2023-04-21 NOTE — ED Provider Notes (Signed)
[place] EMERGENCY DEPARTMENT AT [facility] Provider Note   CSN: [id number] Arrival date & time: 04/21/23  1020     History  Chief Complaint  Patient presents with   Kiara Blair is a 89 y.o. female with a past medical history significant for hypertension, diabetes, hyperlipidemia, history of CVA who presents to the ED after an unwitnessed fall at her living facility.  Patient states she was turning around in her kitchen and fell and hit the front aspect of her head on the wall.  No LOC.  On ASA 81mg ; however no other blood thinners. Patient admits to mild headache directly after the fall which has resolved.  No prodromal symptoms.  Denies any urinary symptoms.  No recent illness.  No chest pain or shortness of breath.  Denies visual changes and speech changes.  No nausea or vomiting.  Denies dizziness and numbness/tingling. Also sustained a skin tear to left upper arm. Believes her tetanus shot was within the past 5 years. Son is at bedside.   History obtained from patient and past medical records. No interpreter used during encounter.       Home Medications Prior to Admission medications   Medication Sig Start Date End Date Taking? Authorizing Provider  Alogliptin Benzoate 25 MG TABS Take 1 tablet by mouth daily. 03/19/23  Yes [provider]  acetaminophen (TYLENOL) 325 MG tablet Take 2 tablets (650 mg total) by mouth every 6 (six) hours as needed. Patient taking differently: Take 650 mg by mouth as needed for moderate pain. 12/06/21   Sloan Leiter, DO  Alogliptin-metFORMIN HCl (KAZANO) 12.02-999 MG TABS Take 1 tablet by mouth in the morning and at bedtime.    [provider]  amLODipine-benazepril (LOTREL) 5-20 MG per capsule Take 1 capsule by mouth at bedtime.    [provider]  aspirin EC 81 MG tablet Take 1 tablet (81 mg total) by mouth daily. Swallow whole. 12/05/22   Azucena Fallen, MD  ASPIRIN LOW DOSE 81 MG tablet Take 81  mg by mouth daily. 02/14/23   [provider]  atorvastatin (LIPITOR) 40 MG tablet Take 1 tablet (40 mg total) by mouth daily. 02/08/21   Ihor Austin, NP  Cholecalciferol (VITAMIN D3) 50 MCG (2000 UT) TABS Take 1 tablet by mouth daily. 01/01/23   [provider]  escitalopram (LEXAPRO) 5 MG tablet Take 5 mg by mouth daily. 01/26/23   [provider]  hydrOXYzine (ATARAX) 10 MG tablet Take 10 mg by mouth 2 (two) times daily. 12/04/22   [provider]  lidocaine (XYLOCAINE) 5 % ointment Apply 1 Application topically 4 (four) times daily as needed for moderate pain. Patient taking differently: Apply 1 Application topically as needed for moderate pain. 07/31/22   Long, Arlyss Repress, MD  melatonin 3 MG TABS tablet Take 3 mg by mouth at bedtime. 01/15/23   [provider]  Multiple Vitamin (MULTIVITAMIN WITH MINERALS) TABS tablet Take 1 tablet by mouth in the morning and at bedtime. Dr. Linton Flemings Vitamins.    [provider]  NOVOLOG FLEXPEN 100 UNIT/ML FlexPen Inject 4 Units into the skin in the morning, at noon, and at bedtime. 01/08/23   [provider]  PAIN RELIEF MAXIMUM STRENGTH 4 % Place 1 patch onto the skin daily. 12/22/22   [provider]  propranolol (INDERAL) 10 MG tablet Take 10 mg by mouth 2 (two) times daily. 01/05/21   [provider]  Allergies    Doxycycline hyclate, Levofloxacin, Meloxicam, Naproxen-esomeprazole mg, Nitrofurantoin, Other, and Pneumococcal polysaccharide vaccine    Review of Systems   Review of Systems  Eyes:  Negative for visual disturbance.  Respiratory:  Negative for shortness of breath.   Cardiovascular:  Negative for chest pain.  Gastrointestinal:  Negative for abdominal pain.  Neurological:  Negative for weakness and numbness.    Physical Exam Updated Vital Signs BP (!) 150/66 (BP Location: Right Arm)   Pulse 66   Temp 98 F (36.7 C) (Oral)   Resp 16  Physical Exam Vitals and  nursing note reviewed.  Constitutional:      General: She is not in acute distress.    Appearance: She is not ill-appearing.  HENT:     Head: Normocephalic.  Eyes:     Pupils: Pupils are equal, round, and reactive to light.  Neck:     Comments: C-collar in place Cardiovascular:     Rate and Rhythm: Normal rate and regular rhythm.     Pulses: Normal pulses.     Heart sounds: Normal heart sounds. No murmur heard.    No friction rub. No gallop.  Pulmonary:     Effort: Pulmonary effort is normal.     Breath sounds: Normal breath sounds.  Abdominal:     General: Abdomen is flat. There is no distension.     Palpations: Abdomen is soft.     Tenderness: There is no abdominal tenderness. There is no guarding or rebound.  Musculoskeletal:        General: Normal range of motion.     Cervical back: Neck supple.  Skin:    General: Skin is warm and dry.     Comments: Skin tear to left upper arm  Neurological:     General: No focal deficit present.     Mental Status: She is alert.     Comments: Speech is clear, able to follow commands CN III-XII intact Normal strength in upper and lower extremities bilaterally including dorsiflexion and plantar flexion, strong and equal grip strength Sensation grossly intact throughout Moves extremities without ataxia, coordination intact No pronator drift Bilateral hand tremor  Psychiatric:        Mood and Affect: Mood normal.        Behavior: Behavior normal.     ED Results / Procedures / Treatments   Labs (all labs ordered are listed, but only abnormal results are displayed) Labs Reviewed  CBC WITH DIFFERENTIAL/PLATELET - Abnormal; Notable for the following components:      Result Value   Hemoglobin 11.2 (*)    HCT 34.6 (*)    All other components within normal limits  BASIC METABOLIC PANEL - Abnormal; Notable for the following components:   Glucose, Bld 152 (*)    BUN 24 (*)    Anion gap 4 (*)    All other components within normal limits   URINALYSIS, ROUTINE W REFLEX MICROSCOPIC - Abnormal; Notable for the following components:   Glucose, UA 250 (*)    Leukocytes,Ua SMALL (*)    All other components within normal limits    EKG None  Radiology DG Pelvis 1-2 Views  Result Date: 04/21/2023 CLINICAL DATA:  Larey Seat. EXAM: PELVIS - 1-2 VIEW COMPARISON:  02/15/2023 FINDINGS: Pelvic bony ring is intact. No gross abnormality to either hip. Gas and stool in the rectum. Degenerative changes in lower lumbar spine. Stable appearance of the SI joints. IMPRESSION: No acute abnormality to the pelvis. Electronically Signed  By: Richarda Overlie M.D.   On: 04/21/2023 11:35   DG Chest Portable 1 View  Result Date: 04/21/2023 CLINICAL DATA:  Mechanical fall. EXAM: PORTABLE CHEST 1 VIEW COMPARISON:  Chest and left rib radiographs 04/01/2023 FINDINGS: The heart is mildly enlarged. Atherosclerotic changes are present at the aortic arch. Lungs are clear. No nodule or mass lesion is present. No edema or effusion is present. Degenerative changes are present at the shoulders, left greater than right. No acute fractures are evident. IMPRESSION: 1. Mild cardiomegaly without failure. 2. No acute cardiopulmonary disease or trauma. Electronically Signed   By: Marin Roberts M.D.   On: 04/21/2023 11:33   CT Head Wo Contrast  Result Date: 04/21/2023 CLINICAL DATA:  Head trauma, minor (Age >= 65y); Neck trauma (Age >= 65y) EXAM: CT HEAD WITHOUT CONTRAST CT CERVICAL SPINE WITHOUT CONTRAST TECHNIQUE: Multidetector CT imaging of the head and cervical spine was performed following the standard protocol without intravenous contrast. Multiplanar CT image reconstructions of the cervical spine were also generated. RADIATION DOSE REDUCTION: This exam was performed according to the departmental dose-optimization program which includes automated exposure control, adjustment of the mA and/or kV according to patient size and/or use of iterative reconstruction technique.  COMPARISON:  04/01/2023, 02/15/2023 FINDINGS: CT HEAD FINDINGS Brain: No evidence of acute infarction, hemorrhage, hydrocephalus, extra-axial collection or mass lesion/mass effect. Scattered low-density changes within the periventricular and subcortical white matter most compatible with chronic microvascular ischemic change. Mild diffuse cerebral volume loss. Vascular: Atherosclerotic calcifications involving the large vessels of the skull base. No unexpected hyperdense vessel. Skull: Normal. Negative for fracture or focal lesion. Sinuses/Orbits: No acute finding. Other: Negative for scalp hematoma. CT CERVICAL SPINE FINDINGS Alignment: Facet joints are aligned without dislocation or traumatic listhesis. Dens and lateral masses are aligned. Skull base and vertebrae: No acute fracture. No primary bone lesion or focal pathologic process. Soft tissues and spinal canal: No prevertebral fluid or swelling. No visible canal hematoma. Disc levels:  Advanced multilevel cervical spondylosis, unchanged. Upper chest: Negative. Other: Right thyroid lobe is enlarged and heterogeneous, unchanged compared to priors. This has been previously characterized by ultrasound in 2021. IMPRESSION: 1. No acute intracranial abnormality. 2. No acute fracture or subluxation of the cervical spine. Electronically Signed   By: Duanne Guess D.O.   On: 04/21/2023 11:33   CT Cervical Spine Wo Contrast  Result Date: 04/21/2023 CLINICAL DATA:  Head trauma, minor (Age >= 65y); Neck trauma (Age >= 65y) EXAM: CT HEAD WITHOUT CONTRAST CT CERVICAL SPINE WITHOUT CONTRAST TECHNIQUE: Multidetector CT imaging of the head and cervical spine was performed following the standard protocol without intravenous contrast. Multiplanar CT image reconstructions of the cervical spine were also generated. RADIATION DOSE REDUCTION: This exam was performed according to the departmental dose-optimization program which includes automated exposure control, adjustment of  the mA and/or kV according to patient size and/or use of iterative reconstruction technique. COMPARISON:  04/01/2023, 02/15/2023 FINDINGS: CT HEAD FINDINGS Brain: No evidence of acute infarction, hemorrhage, hydrocephalus, extra-axial collection or mass lesion/mass effect. Scattered low-density changes within the periventricular and subcortical white matter most compatible with chronic microvascular ischemic change. Mild diffuse cerebral volume loss. Vascular: Atherosclerotic calcifications involving the large vessels of the skull base. No unexpected hyperdense vessel. Skull: Normal. Negative for fracture or focal lesion. Sinuses/Orbits: No acute finding. Other: Negative for scalp hematoma. CT CERVICAL SPINE FINDINGS Alignment: Facet joints are aligned without dislocation or traumatic listhesis. Dens and lateral masses are aligned. Skull base and vertebrae: No acute  fracture. No primary bone lesion or focal pathologic process. Soft tissues and spinal canal: No prevertebral fluid or swelling. No visible canal hematoma. Disc levels:  Advanced multilevel cervical spondylosis, unchanged. Upper chest: Negative. Other: Right thyroid lobe is enlarged and heterogeneous, unchanged compared to priors. This has been previously characterized by ultrasound in 2021. IMPRESSION: 1. No acute intracranial abnormality. 2. No acute fracture or subluxation of the cervical spine. Electronically Signed   By: Duanne Guess D.O.   On: 04/21/2023 11:33    Procedures Procedures    Medications Ordered in ED Medications - No data to display  ED Course/ Medical Decision Making/ A&P                             Medical Decision Making Amount and/or Complexity of Data Reviewed Independent Historian: caregiver Labs: ordered. Decision-making details documented in ED Course. Radiology: ordered and independent interpretation performed. Decision-making details documented in ED Course. ECG/medicine tests: ordered and independent  interpretation performed. Decision-making details documented in ED Course.   This patient presents to the ED for concern of fall, this involves an extensive number of treatment options, and is a complaint that carries with it a high risk of complications and morbidity.  The differential diagnosis includes intracranial bleed, bony fracture, PNA, UTI, etc  89 year old female presents to the ED after a fall.  Unclear what caused patient to fall.  No prodromal symptoms.  Son at bedside.  Patient admits to hitting her head.  On ASA 81 mg however, no other blood thinners.  Upon arrival, stable vitals.  Patient in no acute distress.  Normal neurological exam.  Superficial skin tear to left upper arm.  Patient states her tetanus has been updated within the past 5 years.  C-collar in place.  No cervical, thoracic, or lumbar midline tenderness.  CT head and cervical spine ordered.  Screening chest x-ray and pelvis x-ray bony fractures.  Routine labs ordered given it is unclear if this was a mechanical fall or not however, I suspect this is likely mechanical fall given patient's history of recurrent falls.  CT head and cervical spine personally reviewed and interpreted which are negative for any acute abnormalities.  Chest x-ray and pelvis x-ray negative for any bony fractures or acute abnormalities.  CBC reassuring.  No leukocytosis.  Mild anemia with hemoglobin at 11.2.  BMP significant for hyperglycemia 152.  Normal creatinine.  Small leukocytes in urine.  No bacteria.  Low suspicion for infection.  EKG demonstrates normal sinus rhythm.  No signs of acute ischemia.  Low suspicion for cardiac etiology of fall.  Patient able to ambulate here in the ED without difficulty.  Wound thoroughly cleaned.  Does not warrant suture repair given superficial nature.  Suspect fall secondary to mechanical fall given patient's history.  Patient stable for discharge. Strict ED precautions discussed with patient. Patient states  understanding and agrees to plan. Patient discharged home in no acute distress and stable vitals  Lives in living facility Hx dementia       Final Clinical Impression(s) / ED Diagnoses Final diagnoses:  Fall, initial encounter    Rx / DC Orders ED Discharge Orders     None         Mannie Stabile, PA-C 04/21/23 1355    Edwin Dada P, DO 04/30/23 2325

## 2023-05-23 ENCOUNTER — Encounter (HOSPITAL_BASED_OUTPATIENT_CLINIC_OR_DEPARTMENT_OTHER): Payer: Self-pay | Admitting: Emergency Medicine

## 2023-05-23 ENCOUNTER — Other Ambulatory Visit (HOSPITAL_BASED_OUTPATIENT_CLINIC_OR_DEPARTMENT_OTHER): Payer: Self-pay

## 2023-05-23 ENCOUNTER — Emergency Department (HOSPITAL_BASED_OUTPATIENT_CLINIC_OR_DEPARTMENT_OTHER): Payer: Medicare Other

## 2023-05-23 ENCOUNTER — Other Ambulatory Visit: Payer: Self-pay

## 2023-05-23 ENCOUNTER — Emergency Department (HOSPITAL_BASED_OUTPATIENT_CLINIC_OR_DEPARTMENT_OTHER)
Admission: EM | Admit: 2023-05-23 | Discharge: 2023-05-23 | Disposition: A | Discharge status: Home / Self Care | Payer: Medicare Other | Attending: Emergency Medicine | Admitting: Emergency Medicine

## 2023-05-23 DIAGNOSIS — Z79899 Other long term (current) drug therapy: Secondary | ICD-10-CM | POA: Insufficient documentation

## 2023-05-23 DIAGNOSIS — G588 Other specified mononeuropathies: Secondary | ICD-10-CM | POA: Diagnosis not present

## 2023-05-23 DIAGNOSIS — Z794 Long term (current) use of insulin: Secondary | ICD-10-CM | POA: Insufficient documentation

## 2023-05-23 DIAGNOSIS — M79672 Pain in left foot: Secondary | ICD-10-CM | POA: Diagnosis present

## 2023-05-23 DIAGNOSIS — Z7982 Long term (current) use of aspirin: Secondary | ICD-10-CM | POA: Diagnosis not present

## 2023-05-23 LAB — CBG MONITORING, ED: Glucose-Capillary: 174 mg/dL — ABNORMAL HIGH (ref 70–99)

## 2023-05-23 MED ORDER — PREGABALIN 100 MG PO CAPS: 100.0000 mg | ORAL_CAPSULE | Freq: Two times a day (BID) | ORAL | 0 refills | Status: DC

## 2023-05-23 MED ORDER — TRAMADOL HCL 50 MG PO TABS: ORAL_TABLET | ORAL | 0 refills | Status: DC

## 2023-05-23 MED ORDER — IBUPROFEN 400 MG PO TABS
400.0000 mg | ORAL_TABLET | Freq: Once | ORAL | Status: AC
  Administered 2023-05-23: 400 mg via ORAL
  Filled 2023-05-23: qty 1

## 2023-05-23 MED ORDER — ACETAMINOPHEN 500 MG PO TABS
1000.0000 mg | ORAL_TABLET | Freq: Once | ORAL | Status: AC
  Administered 2023-05-23: 1000 mg via ORAL
  Filled 2023-05-23: qty 2

## 2023-05-23 MED ORDER — TRAMADOL HCL 50 MG PO TABS
50.0000 mg | ORAL_TABLET | Freq: Once | ORAL | Status: AC
  Administered 2023-05-23: 50 mg via ORAL
  Filled 2023-05-23: qty 1

## 2023-05-23 NOTE — Discharge Instructions (Addendum)
1.  Your foot pain seems to be due to a nerve that is irritated.  There is a condition where there is some compression in the nerves going to the toes at the level of the ankle.  You will need to follow-up with an orthopedic doctor for recheck to see if any procedural treatment or injections are needed.  2. You are also instructed to watch carefully for any signs of small blisters that look like water blisters.  Sometimes shingles starts with severe pains before you see the rash.  If you see this rash, you need to see your doctor soon as possible to get started on the appropriate medication for this condition. 3.  Educational information has been included on anterior tarsal tunnel syndrome.  This is a specific type of nerve compression.  You may have a different type of nerve injury or compression however this is the symptom and similar process. 4.  You have been prescribed a medication that may be helpful with nerve related pain.  You are prescribed Lyrica.  You can start taking this medication twice daily to see if it is helpful.  You are also prescribed tramadol which is a slightly stronger pain medication which can be taken every 6 hours.  I suggest you take a dose of extra strength Tylenol every 6 hours for the next couple of days to see if symptoms are improving, add 1-2 tramadol tablets every 6 hours if you need additional pain control.  You may also add a dose of over-the-counter ibuprofen 400 mg every 6-8 hours.  This can be very helpful with inflammatory pain. 5.  Schedule follow-up with an orthopedic specialist.  You may choose whom you have seen previously.  Contact information is included for Dr. August Saucer.

## 2023-05-23 NOTE — ED Triage Notes (Addendum)
Presents from home for toe pain L, additionally foot pain/tingling that is pulsating. H/o DM.   No wound noted on foot in triage.

## 2023-05-23 NOTE — ED Provider Notes (Signed)
[place] EMERGENCY DEPARTMENT AT [facility] Provider Note   CSN: [id number] Arrival date & time: 05/23/23  1520     History  Chief Complaint  Patient presents with   Foot Pain    Kiara Blair is a 89 y.o. female.  HPI Patient started getting intense episodes of pain into her left toes.  It started overnight.  The great toe is spared but she is getting sharp lancinating pains that seem to predominantly involve the second toe but by description go to all 4 toes.  No injury that she recalls although patient has had some falls out of bed and admits that she could have jammed the toe.  She reports it had been okay at rest but then physical therapy had her do more gait work and that seem to get it exacerbated.  No pain at the lower leg or the knee.    Home Medications Prior to Admission medications   Medication Sig Start Date End Date Taking? Authorizing Provider  pregabalin (LYRICA) 100 MG capsule Take 1 capsule (100 mg total) by mouth 2 (two) times daily. 05/23/23  Yes Arby Barrette, MD  traMADol (ULTRAM) 50 MG tablet To 2 tablets every 6 hours for pain control.  Take with a dose of extra strength Tylenol. 05/23/23  Yes Arby Barrette, MD  acetaminophen (TYLENOL) 325 MG tablet Take 2 tablets (650 mg total) by mouth every 6 (six) hours as needed. Patient taking differently: Take 650 mg by mouth as needed for moderate pain. 12/06/21   Sloan Leiter, DO  Alogliptin Benzoate 25 MG TABS Take 1 tablet by mouth daily. 03/19/23   [provider]  Alogliptin-metFORMIN HCl (KAZANO) 12.02-999 MG TABS Take 1 tablet by mouth in the morning and at bedtime.    [provider]  amLODipine-benazepril (LOTREL) 5-20 MG per capsule Take 1 capsule by mouth at bedtime.    [provider]  aspirin EC 81 MG tablet Take 1 tablet (81 mg total) by mouth daily. Swallow whole. 12/05/22   Azucena Fallen, MD  ASPIRIN LOW DOSE 81 MG tablet Take 81 mg by mouth daily. 02/14/23    [provider]  atorvastatin (LIPITOR) 40 MG tablet Take 1 tablet (40 mg total) by mouth daily. 02/08/21   Ihor Austin, NP  Cholecalciferol (VITAMIN D3) 50 MCG (2000 UT) TABS Take 1 tablet by mouth daily. 01/01/23   [provider]  escitalopram (LEXAPRO) 5 MG tablet Take 5 mg by mouth daily. 01/26/23   [provider]  hydrOXYzine (ATARAX) 10 MG tablet Take 10 mg by mouth 2 (two) times daily. 12/04/22   [provider]  lidocaine (XYLOCAINE) 5 % ointment Apply 1 Application topically 4 (four) times daily as needed for moderate pain. Patient taking differently: Apply 1 Application topically as needed for moderate pain. 07/31/22   Long, Arlyss Repress, MD  melatonin 3 MG TABS tablet Take 3 mg by mouth at bedtime. 01/15/23   [provider]  Multiple Vitamin (MULTIVITAMIN WITH MINERALS) TABS tablet Take 1 tablet by mouth in the morning and at bedtime. Dr. Linton Flemings Vitamins.    [provider]  NOVOLOG FLEXPEN 100 UNIT/ML FlexPen Inject 4 Units into the skin in the morning, at noon, and at bedtime. 01/08/23   [provider]  omeprazole (PRILOSEC) 20 MG capsule Take 1 capsule (20 mg total) by mouth daily. 05/25/23   Gwyneth Sprout, MD  PAIN RELIEF MAXIMUM STRENGTH 4 % Place 1 patch onto the skin daily. 12/22/22  [provider]  propranolol (INDERAL) 10 MG tablet Take 10 mg by mouth 2 (two) times daily. 01/05/21   [provider]      Allergies    Doxycycline hyclate, Levofloxacin, Meloxicam, Naproxen-esomeprazole mg, Nitrofurantoin, Other, and Pneumococcal polysaccharide vaccine    Review of Systems   Review of Systems  Physical Exam Updated Vital Signs BP (!) 153/65 (BP Location: Left Arm)   Pulse 69   Temp 98.7 F (37.1 C)   Resp 18   SpO2 96%  Physical Exam Constitutional:      Comments: Alert nontoxic well-nourished well-developed.  Eyes:     Extraocular Movements: Extraocular movements intact.   Cardiovascular:     Rate and Rhythm: Normal rate and regular rhythm.  Pulmonary:     Effort: Pulmonary effort is normal.     Breath sounds: Normal breath sounds.  Musculoskeletal:        General: Normal range of motion.     Comments: Both lower extremities are examined.  Lower extremities are in good condition.  There is no effusions at the knees.  Calves are soft and nontender.  Patient does not have any peripheral edema.  She has adequate musculature for age.  Feet have no swelling.  Dorsalis pedis pulses are 2+ symmetric.  Initially with examination of the toes of the left foot when I touched the second toe on the top of the toe, patient winced in reported significant pain.  Subsequent to that I rotated and manipulated all the other toes with no pain and palpated the sole of the foot with no pain.  At that point the pain had gone away from the second toe as well.  I could palpate manipulate without any difficulty.  Then in about another 5 minutes patient winced with a lancinating pain that shot down into the 4 toes.  She appears to be getting lancinating, electrical quality pain periodically.  After that occurred, the skin on the top of the second toe was hypersensitive again for a brief period of time.  Neurological:     General: No focal deficit present.     Mental Status: She is oriented to person, place, and time.     Coordination: Coordination normal.  Psychiatric:        Mood and Affect: Mood normal.     ED Results / Procedures / Treatments   Labs (all labs ordered are listed, but only abnormal results are displayed) Labs Reviewed  CBG MONITORING, ED - Abnormal; Notable for the following components:      Result Value   Glucose-Capillary 174 (*)    All other components within normal limits    EKG None  Radiology DG Abd 2 Views  Result Date: 05/25/2023 CLINICAL DATA:  Evaluate for foreign body, swallowed piece of tooth PICC. EXAM: ABDOMEN - 2 VIEW COMPARISON:  None  Available. FINDINGS: The bowel gas pattern is normal. There is no evidence of free air. Cholecystectomy clips are noted. No radio-opaque calculi or other significant radiographic abnormality is seen. Visualized lungs are clear. Cardiac silhouette is mildly enlarged. IMPRESSION: 1. No radiopaque foreign body. 2. Nonobstructive bowel gas pattern. Electronically Signed   By: Darliss Cheney M.D.   On: 05/25/2023 16:28    Procedures Procedures    Medications Ordered in ED Medications  ibuprofen (ADVIL) tablet 400 mg (400 mg Oral Given 05/23/23 1657)  acetaminophen (TYLENOL) tablet 1,000 mg (1,000 mg Oral Given 05/23/23 1752)  traMADol (ULTRAM) tablet 50 mg (50 mg Oral Given  05/23/23 1752)    ED Course/ Medical Decision Making/ A&P                                 Medical Decision Making Amount and/or Complexity of Data Reviewed Radiology: ordered.  Risk OTC drugs. Prescription drug management.   Patient is experiencing lancinating foot pain.  The foot exam is normal except at certain points or seems to be a trigger for an episode of neuralgia most focused on the second digit, first digit spared and 3rd through 5th digits painful as well.  In between episodes, pain resolves completely.  X-rays reviewed by radiology no acute fractures.  At this time with normal neurovascular examination and pain consistent with neuralgia, I suspect nerve compression possibly in the ankle, possible anterior tarsal tunnel syndrome.  Patient has severe pain when these episodes occur.  Will try combination of Lyrica and acetaminophen, with tramadol if needed for more severe pain.  Patient and her son was present with her have been counseled to follow-up with orthopedics for recheck.        Final Clinical Impression(s) / ED Diagnoses Final diagnoses:  Other mononeuropathy  Left foot pain    Rx / DC Orders ED Discharge Orders          Ordered    pregabalin (LYRICA) 100 MG capsule  2 times daily         05/23/23 1745    traMADol (ULTRAM) 50 MG tablet        05/23/23 1745              Arby Barrette, MD 05/27/23 [phone]

## 2023-05-25 ENCOUNTER — Emergency Department (HOSPITAL_BASED_OUTPATIENT_CLINIC_OR_DEPARTMENT_OTHER): Payer: Medicare Other

## 2023-05-25 ENCOUNTER — Emergency Department (HOSPITAL_BASED_OUTPATIENT_CLINIC_OR_DEPARTMENT_OTHER)
Admission: EM | Admit: 2023-05-25 | Discharge: 2023-05-25 | Disposition: A | Discharge status: Home / Self Care | Payer: Medicare Other | Attending: Emergency Medicine | Admitting: Emergency Medicine

## 2023-05-25 ENCOUNTER — Encounter (HOSPITAL_BASED_OUTPATIENT_CLINIC_OR_DEPARTMENT_OTHER): Payer: Self-pay | Admitting: Emergency Medicine

## 2023-05-25 ENCOUNTER — Other Ambulatory Visit: Payer: Self-pay

## 2023-05-25 DIAGNOSIS — I10 Essential (primary) hypertension: Secondary | ICD-10-CM | POA: Insufficient documentation

## 2023-05-25 DIAGNOSIS — T189XXA Foreign body of alimentary tract, part unspecified, initial encounter: Secondary | ICD-10-CM | POA: Diagnosis not present

## 2023-05-25 DIAGNOSIS — Z79899 Other long term (current) drug therapy: Secondary | ICD-10-CM | POA: Insufficient documentation

## 2023-05-25 DIAGNOSIS — Z7982 Long term (current) use of aspirin: Secondary | ICD-10-CM | POA: Diagnosis not present

## 2023-05-25 DIAGNOSIS — E119 Type 2 diabetes mellitus without complications: Secondary | ICD-10-CM | POA: Insufficient documentation

## 2023-05-25 DIAGNOSIS — X58XXXA Exposure to other specified factors, initial encounter: Secondary | ICD-10-CM | POA: Insufficient documentation

## 2023-05-25 MED ORDER — OMEPRAZOLE 20 MG PO CPDR: 20.0000 mg | DELAYED_RELEASE_CAPSULE | Freq: Every day | ORAL | 0 refills | Status: DC

## 2023-05-25 NOTE — ED Notes (Signed)
Reviewed AVS/discharge instruction with patient. Time allotted for and all questions answered. Patient is agreeable for d/c and escorted to ed exit by staff.  

## 2023-05-25 NOTE — Discharge Instructions (Signed)
Return to the emergency room if you develop any abdominal pain, bloating or vomiting.  Otherwise you can eat and drink like normal and you will take this antacid pill for the next 4 days.

## 2023-05-25 NOTE — ED Notes (Signed)
Son richard to provide transport home. Reviewed medications

## 2023-05-25 NOTE — ED Notes (Signed)
Spoke with family (son Mellody Dance), updated. Reviewed discharge instructions Spoke with [facility], report given to nursing staff and med tech.

## 2023-05-25 NOTE — ED Provider Notes (Signed)
[place] EMERGENCY DEPARTMENT AT [facility] Provider Note   CSN: [id number] Arrival date & time: 05/25/23  1452     History  Chief Complaint  Patient presents with   Swallowed Foreign Body    Kiara Blair is a 89 y.o. female.  Patient is a 89 year old female with a history of diabetes, hypertension, hyperlipidemia who is presenting today because she accidentally swallowed a toothpick.  She reports the toothpick was in her mouth and she took a bite of food and swallowed everything.  This was around lunchtime.  She did report this to the staff at where she lives and to her son and they felt she needed to come here for evaluation.  She denies any sore throat, difficulty swallowing or abdominal pain at this time.  The history is provided by the patient.  Swallowed Foreign Body       Home Medications Prior to Admission medications   Medication Sig Start Date End Date Taking? Authorizing Provider  omeprazole (PRILOSEC) 20 MG capsule Take 1 capsule (20 mg total) by mouth daily. 05/25/23  Yes Gwyneth Sprout, MD  acetaminophen (TYLENOL) 325 MG tablet Take 2 tablets (650 mg total) by mouth every 6 (six) hours as needed. Patient taking differently: Take 650 mg by mouth as needed for moderate pain. 12/06/21   Sloan Leiter, DO  Alogliptin Benzoate 25 MG TABS Take 1 tablet by mouth daily. 03/19/23   [provider]  Alogliptin-metFORMIN HCl (KAZANO) 12.02-999 MG TABS Take 1 tablet by mouth in the morning and at bedtime.    [provider]  amLODipine-benazepril (LOTREL) 5-20 MG per capsule Take 1 capsule by mouth at bedtime.    [provider]  aspirin EC 81 MG tablet Take 1 tablet (81 mg total) by mouth daily. Swallow whole. 12/05/22   Azucena Fallen, MD  ASPIRIN LOW DOSE 81 MG tablet Take 81 mg by mouth daily. 02/14/23   [provider]  atorvastatin (LIPITOR) 40 MG tablet Take 1 tablet (40 mg total) by mouth daily. 02/08/21   Ihor Austin, NP  Cholecalciferol (VITAMIN D3) 50 MCG (2000 UT) TABS Take 1 tablet by mouth daily. 01/01/23   [provider]  escitalopram (LEXAPRO) 5 MG tablet Take 5 mg by mouth daily. 01/26/23   [provider]  hydrOXYzine (ATARAX) 10 MG tablet Take 10 mg by mouth 2 (two) times daily. 12/04/22   [provider]  lidocaine (XYLOCAINE) 5 % ointment Apply 1 Application topically 4 (four) times daily as needed for moderate pain. Patient taking differently: Apply 1 Application topically as needed for moderate pain. 07/31/22   Long, Arlyss Repress, MD  melatonin 3 MG TABS tablet Take 3 mg by mouth at bedtime. 01/15/23   [provider]  Multiple Vitamin (MULTIVITAMIN WITH MINERALS) TABS tablet Take 1 tablet by mouth in the morning and at bedtime. Dr. Linton Flemings Vitamins.    [provider]  NOVOLOG FLEXPEN 100 UNIT/ML FlexPen Inject 4 Units into the skin in the morning, at noon, and at bedtime. 01/08/23   [provider]  PAIN RELIEF MAXIMUM STRENGTH 4 % Place 1 patch onto the skin daily. 12/22/22   [provider]  pregabalin (LYRICA) 100 MG capsule Take 1 capsule (100 mg total) by mouth 2 (two) times daily. 05/23/23   Arby Barrette, MD  propranolol (INDERAL) 10 MG tablet Take 10 mg by mouth 2 (two) times daily. 01/05/21   [provider]  traMADol (ULTRAM) 50 MG tablet To  2 tablets every 6 hours for pain control.  Take with a dose of extra strength Tylenol. 05/23/23   Arby Barrette, MD      Allergies    Doxycycline hyclate, Levofloxacin, Meloxicam, Naproxen-esomeprazole mg, Nitrofurantoin, Other, and Pneumococcal polysaccharide vaccine    Review of Systems   Review of Systems  Physical Exam Updated Vital Signs BP (!) 150/76 (BP Location: Left Arm)   Pulse 63   Temp 98.5 F (36.9 C)   Resp 17   SpO2 96%  Physical Exam Vitals and nursing note reviewed.  Constitutional:      General: She is not in acute distress. HENT:     Head:  Normocephalic and atraumatic.     Mouth/Throat:     Mouth: Mucous membranes are moist.  Eyes:     Pupils: Pupils are equal, round, and reactive to light.  Cardiovascular:     Rate and Rhythm: Normal rate.  Pulmonary:     Effort: Pulmonary effort is normal.  Abdominal:     General: Abdomen is flat.     Palpations: Abdomen is soft.     Tenderness: There is no abdominal tenderness. There is no guarding or rebound.  Musculoskeletal:     Cervical back: Normal range of motion.  Skin:    General: Skin is warm.  Neurological:     Mental Status: She is alert. Mental status is at baseline.     ED Results / Procedures / Treatments   Labs (all labs ordered are listed, but only abnormal results are displayed) Labs Reviewed - No data to display  EKG None  Radiology DG Abd 2 Views  Result Date: 05/25/2023 CLINICAL DATA:  Evaluate for foreign body, swallowed piece of tooth PICC. EXAM: ABDOMEN - 2 VIEW COMPARISON:  None Available. FINDINGS: The bowel gas pattern is normal. There is no evidence of free air. Cholecystectomy clips are noted. No radio-opaque calculi or other significant radiographic abnormality is seen. Visualized lungs are clear. Cardiac silhouette is mildly enlarged. IMPRESSION: 1. No radiopaque foreign body. 2. Nonobstructive bowel gas pattern. Electronically Signed   By: Darliss Cheney M.D.   On: 05/25/2023 16:28   DG Foot Complete Left  Result Date: 05/23/2023 CLINICAL DATA:  Second toe pain. Tingling and pulsating. No history of trauma or visible wound EXAM: LEFT FOOT - COMPLETE 3 VIEW COMPARISON:  None Available. FINDINGS: Osteopenia. Slight degenerative changes of the first metatarsophalangeal joint. No acute fracture or dislocation. Small well corticated plantar and Achilles calcaneal spurs. Vascular calcifications are seen. IMPRESSION: Osteopenia.  Chronic changes.  No acute osseous abnormality. Electronically Signed   By: Karen Kays M.D.   On: 05/23/2023 17:15     Procedures Procedures    Medications Ordered in ED Medications - No data to display  ED Course/ Medical Decision Making/ A&P                                 Medical Decision Making Amount and/or Complexity of Data Reviewed Radiology: ordered and independent interpretation performed. Decision-making details documented in ED Course.   Patient presenting today after accidentally swallowing a toothpick.  She does not have any swallowing difficulty or upper airway symptoms and low suspicion for the toothpick causing any perforation of her esophagus or aspiration into her trachea.  However will discuss with GI course of management as the toothpick is definitely in her stomach or lower.  4:53 PM Spoke with Dr. Bosie Clos  with GI who reports that this time patient does not need any further intervention.  He did recommend omeprazole for the next 4 days but otherwise she can eat and drink normally and return if she develops abdominal pain.  Patient is comfortable with this plan. I have independently visualized and interpreted pt's images today.  X-ray without acute findings today of the abdomen         Final Clinical Impression(s) / ED Diagnoses Final diagnoses:  Swallowed foreign body, initial encounter    Rx / DC Orders ED Discharge Orders          Ordered    omeprazole (PRILOSEC) 20 MG capsule  Daily        05/25/23 1653              Gwyneth Sprout, MD 05/25/23 1653

## 2023-05-25 NOTE — ED Triage Notes (Signed)
Pt presents to ED BIB [facility] from [facility]. Per EMS pt possibly swallowed piece of toothpick. This morning at breakfast. NAD, no sore throat. EMS VSS

## 2023-06-10 ENCOUNTER — Emergency Department (HOSPITAL_COMMUNITY)
Admission: EM | Admit: 2023-06-10 | Discharge: 2023-06-10 | Disposition: A | Discharge status: Home / Self Care | Payer: Medicare Other | Attending: Emergency Medicine | Admitting: Emergency Medicine

## 2023-06-10 ENCOUNTER — Emergency Department (HOSPITAL_COMMUNITY): Payer: Medicare Other

## 2023-06-10 ENCOUNTER — Other Ambulatory Visit: Payer: Self-pay

## 2023-06-10 ENCOUNTER — Encounter (HOSPITAL_COMMUNITY): Payer: Self-pay | Admitting: Emergency Medicine

## 2023-06-10 DIAGNOSIS — S0990XA Unspecified injury of head, initial encounter: Secondary | ICD-10-CM | POA: Diagnosis not present

## 2023-06-10 DIAGNOSIS — W19XXXA Unspecified fall, initial encounter: Secondary | ICD-10-CM

## 2023-06-10 DIAGNOSIS — S51012A Laceration without foreign body of left elbow, initial encounter: Secondary | ICD-10-CM | POA: Diagnosis not present

## 2023-06-10 DIAGNOSIS — Z7982 Long term (current) use of aspirin: Secondary | ICD-10-CM | POA: Diagnosis not present

## 2023-06-10 DIAGNOSIS — E119 Type 2 diabetes mellitus without complications: Secondary | ICD-10-CM | POA: Diagnosis not present

## 2023-06-10 DIAGNOSIS — S59902A Unspecified injury of left elbow, initial encounter: Secondary | ICD-10-CM | POA: Diagnosis present

## 2023-06-10 DIAGNOSIS — Z23 Encounter for immunization: Secondary | ICD-10-CM | POA: Diagnosis not present

## 2023-06-10 DIAGNOSIS — W01198A Fall on same level from slipping, tripping and stumbling with subsequent striking against other object, initial encounter: Secondary | ICD-10-CM | POA: Insufficient documentation

## 2023-06-10 DIAGNOSIS — Z79899 Other long term (current) drug therapy: Secondary | ICD-10-CM | POA: Diagnosis not present

## 2023-06-10 DIAGNOSIS — Z794 Long term (current) use of insulin: Secondary | ICD-10-CM | POA: Diagnosis not present

## 2023-06-10 DIAGNOSIS — I10 Essential (primary) hypertension: Secondary | ICD-10-CM | POA: Diagnosis not present

## 2023-06-10 LAB — URINALYSIS, W/ REFLEX TO CULTURE (INFECTION SUSPECTED)
Bilirubin Urine: NEGATIVE
Glucose, UA: 150 mg/dL — AB
Hgb urine dipstick: NEGATIVE
Ketones, ur: NEGATIVE mg/dL
Nitrite: NEGATIVE
Protein, ur: NEGATIVE mg/dL
Specific Gravity, Urine: 1.006 (ref 1.005–1.030)
pH: 6 (ref 5.0–8.0)

## 2023-06-10 LAB — COMPREHENSIVE METABOLIC PANEL
ALT: 19 U/L (ref 0–44)
AST: 28 U/L (ref 15–41)
Albumin: 3.7 g/dL (ref 3.5–5.0)
Alkaline Phosphatase: 110 U/L (ref 38–126)
Anion gap: 8 (ref 5–15)
BUN: 24 mg/dL — ABNORMAL HIGH (ref 8–23)
CO2: 26 mmol/L (ref 22–32)
Calcium: 9.4 mg/dL (ref 8.9–10.3)
Chloride: 100 mmol/L (ref 98–111)
Creatinine, Ser: 0.77 mg/dL (ref 0.44–1.00)
GFR, Estimated: >60 mL/min (ref >60)
Glucose, Bld: 242 mg/dL — ABNORMAL HIGH (ref 70–99)
Potassium: 4.1 mmol/L (ref 3.5–5.1)
Sodium: 134 mmol/L — ABNORMAL LOW (ref 135–145)
Total Bilirubin: 0.5 mg/dL (ref 0.3–1.2)
Total Protein: 7 g/dL (ref 6.5–8.1)

## 2023-06-10 LAB — CBC WITH DIFFERENTIAL/PLATELET
Abs Immature Granulocytes: 0.05 10*3/uL (ref 0.00–0.07)
Basophils Absolute: 0 10*3/uL (ref 0.0–0.1)
Basophils Relative: 0 %
Eosinophils Absolute: 0.2 10*3/uL (ref 0.0–0.5)
Eosinophils Relative: 2 %
HCT: 39.4 % (ref 36.0–46.0)
Hemoglobin: 12.3 g/dL (ref 12.0–15.0)
Immature Granulocytes: 1 %
Lymphocytes Relative: 20 %
Lymphs Abs: 1.4 10*3/uL (ref 0.7–4.0)
MCH: 27.8 pg (ref 26.0–34.0)
MCHC: 31.2 g/dL (ref 30.0–36.0)
MCV: 88.9 fL (ref 80.0–100.0)
Monocytes Absolute: 0.6 10*3/uL (ref 0.1–1.0)
Monocytes Relative: 8 %
Neutro Abs: 4.7 10*3/uL (ref 1.7–7.7)
Neutrophils Relative %: 69 %
Platelets: 261 10*3/uL (ref 150–400)
RBC: 4.43 MIL/uL (ref 3.87–5.11)
RDW: 14.6 % (ref 11.5–15.5)
WBC: 6.8 10*3/uL (ref 4.0–10.5)
nRBC: 0 % (ref 0.0–0.2)

## 2023-06-10 MED ORDER — TETANUS-DIPHTH-ACELL PERTUSSIS 5-2.5-18.5 LF-MCG/0.5 IM SUSY
0.5000 mL | PREFILLED_SYRINGE | Freq: Once | INTRAMUSCULAR | Status: AC
  Administered 2023-06-10: 0.5 mL via INTRAMUSCULAR
  Filled 2023-06-10: qty 0.5

## 2023-06-10 NOTE — ED Notes (Signed)
Son came by to see Kiara Blair and will be taking her home.

## 2023-06-10 NOTE — ED Triage Notes (Signed)
Pt BIB by EMS from [facility]. Per EMS pt told them had fall not sure if due to syncope or medications. Pt states vision went black but remembers everything. Has hematoma on Left hip, tear on Left elbow. Pt hit head with fall. Pt is diabetic. Not on any blood thinners. VSS

## 2023-06-10 NOTE — ED Notes (Signed)
[facility] called to cancel transportation. Pt's son taking her back to [facility].

## 2023-06-10 NOTE — ED Notes (Signed)
[facility] called and setup transportation.

## 2023-06-10 NOTE — ED Provider Notes (Signed)
[place] EMERGENCY DEPARTMENT AT [facility] Provider Note   CSN: [id number] Arrival date & time: 06/10/23  7829     History {Add pertinent medical, surgical, social history, OB history to HPI:1} Chief Complaint  Patient presents with   Fall   Near Syncope    Kiara Blair is a 89 y.o. female.  HPI     89yo female with history of DM, htn, hlpd,TIA   Independent living [facility]   She states vision did not go back just felt off balance and fell> NO lightheadedness, no syncope. Did hit head, doesn't have any pain other than elbow.  Hip bruise but not significant pain. Does not feel she is hurt from the fall. Does report frequent falls. Not otherwise feeling ill.  Reports she scared the staff when it happened.   Past Medical History:  Diagnosis Date   Diabetes mellitus without complication (HCC)    Hypercholesteremia    Hypertension    TIA (transient ischemic attack) 01/03/2021   Tremor    right arm     Home Medications Prior to Admission medications   Medication Sig Start Date End Date Taking? Authorizing Provider  acetaminophen (TYLENOL) 325 MG tablet Take 2 tablets (650 mg total) by mouth every 6 (six) hours as needed. Patient taking differently: Take 650 mg by mouth as needed for moderate pain. 12/06/21   Sloan Leiter, DO  Alogliptin Benzoate 25 MG TABS Take 1 tablet by mouth daily. 03/19/23   [provider]  Alogliptin-metFORMIN HCl (KAZANO) 12.02-999 MG TABS Take 1 tablet by mouth in the morning and at bedtime.    [provider]  amLODipine-benazepril (LOTREL) 5-20 MG per capsule Take 1 capsule by mouth at bedtime.    [provider]  aspirin EC 81 MG tablet Take 1 tablet (81 mg total) by mouth daily. Swallow whole. 12/05/22   Azucena Fallen, MD  ASPIRIN LOW DOSE 81 MG tablet Take 81 mg by mouth daily. 02/14/23   [provider]  atorvastatin (LIPITOR) 40 MG tablet Take 1 tablet (40 mg total) by mouth daily.  02/08/21   Ihor Austin, NP  Cholecalciferol (VITAMIN D3) 50 MCG (2000 UT) TABS Take 1 tablet by mouth daily. 01/01/23   [provider]  escitalopram (LEXAPRO) 5 MG tablet Take 5 mg by mouth daily. 01/26/23   [provider]  hydrOXYzine (ATARAX) 10 MG tablet Take 10 mg by mouth 2 (two) times daily. 12/04/22   [provider]  lidocaine (XYLOCAINE) 5 % ointment Apply 1 Application topically 4 (four) times daily as needed for moderate pain. Patient taking differently: Apply 1 Application topically as needed for moderate pain. 07/31/22   Long, Arlyss Repress, MD  melatonin 3 MG TABS tablet Take 3 mg by mouth at bedtime. 01/15/23   [provider]  Multiple Vitamin (MULTIVITAMIN WITH MINERALS) TABS tablet Take 1 tablet by mouth in the morning and at bedtime. Dr. Linton Flemings Vitamins.    [provider]  NOVOLOG FLEXPEN 100 UNIT/ML FlexPen Inject 4 Units into the skin in the morning, at noon, and at bedtime. 01/08/23   [provider]  omeprazole (PRILOSEC) 20 MG capsule Take 1 capsule (20 mg total) by mouth daily. 05/25/23   Gwyneth Sprout, MD  PAIN RELIEF MAXIMUM STRENGTH 4 % Place 1 patch onto the skin daily. 12/22/22   [provider]  pregabalin (LYRICA) 100 MG capsule Take 1 capsule (100 mg total) by mouth 2 (two) times daily. 05/23/23  Arby Barrette, MD  propranolol (INDERAL) 10 MG tablet Take 10 mg by mouth 2 (two) times daily. 01/05/21   [provider]  traMADol (ULTRAM) 50 MG tablet To 2 tablets every 6 hours for pain control.  Take with a dose of extra strength Tylenol. 05/23/23   Arby Barrette, MD      Allergies    Doxycycline hyclate, Levofloxacin, Meloxicam, Naproxen-esomeprazole mg, Nitrofurantoin, Other, and Pneumococcal polysaccharide vaccine    Review of Systems   Review of Systems  Physical Exam Updated Vital Signs BP (!) 159/67 (BP Location: Right Arm)   Pulse 66   Temp 98 F (36.7 C)   Resp 15   Ht 5\' 2"   (1.575 m)   Wt 55.8 kg   SpO2 100%   BMI 22.50 kg/m  Physical Exam  ED Results / Procedures / Treatments   Labs (all labs ordered are listed, but only abnormal results are displayed) Labs Reviewed  COMPREHENSIVE METABOLIC PANEL - Abnormal; Notable for the following components:      Result Value   Sodium 134 (*)    Glucose, Bld 242 (*)    BUN 24 (*)    All other components within normal limits  URINALYSIS, W/ REFLEX TO CULTURE (INFECTION SUSPECTED) - Abnormal; Notable for the following components:   Color, Urine STRAW (*)    Glucose, UA 150 (*)    Leukocytes,Ua TRACE (*)    Bacteria, UA RARE (*)    All other components within normal limits  CBC WITH DIFFERENTIAL/PLATELET    EKG None  Radiology No results found.  Procedures Procedures  {Document cardiac monitor, telemetry assessment procedure when appropriate:1}  Medications Ordered in ED Medications - No data to display  ED Course/ Medical Decision Making/ A&P   {   Click here for ABCD2, HEART and other calculatorsREFRESH Note before signing :1}                              Medical Decision Making Amount and/or Complexity of Data Reviewed Radiology: ordered.   ***  {Document critical care time when appropriate:1} {Document review of labs and clinical decision tools ie heart score, Chads2Vasc2 etc:1}  {Document your independent review of radiology images, and any outside records:1} {Document your discussion with family members, caretakers, and with consultants:1} {Document social determinants of health affecting pt's care:1} {Document your decision making why or why not admission, treatments were needed:1} Final Clinical Impression(s) / ED Diagnoses Final diagnoses:  None    Rx / DC Orders ED Discharge Orders     None

## 2024-02-24 ENCOUNTER — Inpatient Hospital Stay (HOSPITAL_COMMUNITY)
Admission: EM | Admit: 2024-02-24 | Discharge: 2024-02-26 | DRG: 689 | Disposition: A | Discharge status: Skilled Nursing Facility | Source: Skilled Nursing Facility | Attending: Family Medicine | Admitting: Family Medicine

## 2024-02-24 ENCOUNTER — Encounter (HOSPITAL_COMMUNITY): Payer: Self-pay

## 2024-02-24 ENCOUNTER — Emergency Department (HOSPITAL_COMMUNITY)

## 2024-02-24 DIAGNOSIS — Z66 Do not resuscitate: Secondary | ICD-10-CM | POA: Diagnosis present

## 2024-02-24 DIAGNOSIS — Z79899 Other long term (current) drug therapy: Secondary | ICD-10-CM

## 2024-02-24 DIAGNOSIS — Z887 Allergy status to serum and vaccine status: Secondary | ICD-10-CM | POA: Diagnosis not present

## 2024-02-24 DIAGNOSIS — N390 Urinary tract infection, site not specified: Secondary | ICD-10-CM | POA: Insufficient documentation

## 2024-02-24 DIAGNOSIS — Z7982 Long term (current) use of aspirin: Secondary | ICD-10-CM

## 2024-02-24 DIAGNOSIS — R4182 Altered mental status, unspecified: Secondary | ICD-10-CM | POA: Diagnosis not present

## 2024-02-24 DIAGNOSIS — G4700 Insomnia, unspecified: Secondary | ICD-10-CM | POA: Diagnosis present

## 2024-02-24 DIAGNOSIS — I10 Essential (primary) hypertension: Secondary | ICD-10-CM | POA: Diagnosis present

## 2024-02-24 DIAGNOSIS — B9620 Unspecified Escherichia coli [E. coli] as the cause of diseases classified elsewhere: Secondary | ICD-10-CM | POA: Diagnosis present

## 2024-02-24 DIAGNOSIS — M130 Polyarthritis, unspecified: Secondary | ICD-10-CM | POA: Diagnosis present

## 2024-02-24 DIAGNOSIS — K5900 Constipation, unspecified: Secondary | ICD-10-CM | POA: Diagnosis present

## 2024-02-24 DIAGNOSIS — Z8673 Personal history of transient ischemic attack (TIA), and cerebral infarction without residual deficits: Secondary | ICD-10-CM | POA: Diagnosis not present

## 2024-02-24 DIAGNOSIS — Z886 Allergy status to analgesic agent status: Secondary | ICD-10-CM

## 2024-02-24 DIAGNOSIS — N3090 Cystitis, unspecified without hematuria: Principal | ICD-10-CM | POA: Diagnosis present

## 2024-02-24 DIAGNOSIS — E119 Type 2 diabetes mellitus without complications: Secondary | ICD-10-CM

## 2024-02-24 DIAGNOSIS — Z7984 Long term (current) use of oral hypoglycemic drugs: Secondary | ICD-10-CM | POA: Diagnosis not present

## 2024-02-24 DIAGNOSIS — E785 Hyperlipidemia, unspecified: Secondary | ICD-10-CM | POA: Diagnosis present

## 2024-02-24 DIAGNOSIS — Z881 Allergy status to other antibiotic agents status: Secondary | ICD-10-CM

## 2024-02-24 DIAGNOSIS — R296 Repeated falls: Secondary | ICD-10-CM | POA: Diagnosis present

## 2024-02-24 DIAGNOSIS — S2232XA Fracture of one rib, left side, initial encounter for closed fracture: Secondary | ICD-10-CM | POA: Diagnosis present

## 2024-02-24 DIAGNOSIS — S2239XA Fracture of one rib, unspecified side, initial encounter for closed fracture: Secondary | ICD-10-CM | POA: Insufficient documentation

## 2024-02-24 DIAGNOSIS — K862 Cyst of pancreas: Secondary | ICD-10-CM | POA: Diagnosis present

## 2024-02-24 DIAGNOSIS — R059 Cough, unspecified: Secondary | ICD-10-CM | POA: Diagnosis present

## 2024-02-24 DIAGNOSIS — R109 Unspecified abdominal pain: Secondary | ICD-10-CM | POA: Insufficient documentation

## 2024-02-24 DIAGNOSIS — W19XXXA Unspecified fall, initial encounter: Secondary | ICD-10-CM | POA: Insufficient documentation

## 2024-02-24 DIAGNOSIS — F05 Delirium due to known physiological condition: Secondary | ICD-10-CM | POA: Diagnosis present

## 2024-02-24 DIAGNOSIS — F03918 Unspecified dementia, unspecified severity, with other behavioral disturbance: Secondary | ICD-10-CM | POA: Diagnosis present

## 2024-02-24 DIAGNOSIS — Z789 Other specified health status: Secondary | ICD-10-CM

## 2024-02-24 DIAGNOSIS — G9341 Metabolic encephalopathy: Secondary | ICD-10-CM | POA: Diagnosis present

## 2024-02-24 DIAGNOSIS — Z794 Long term (current) use of insulin: Secondary | ICD-10-CM

## 2024-02-24 LAB — URINALYSIS, W/ REFLEX TO CULTURE (INFECTION SUSPECTED)
Bilirubin Urine: NEGATIVE
Glucose, UA: 50 mg/dL — AB
Hgb urine dipstick: NEGATIVE
Ketones, ur: 5 mg/dL — AB
Nitrite: POSITIVE — AB
Protein, ur: 30 mg/dL — AB
Specific Gravity, Urine: 1.015 (ref 1.005–1.030)
pH: 5 (ref 5.0–8.0)

## 2024-02-24 LAB — CBC WITH DIFFERENTIAL/PLATELET
Abs Immature Granulocytes: 0.06 10*3/uL (ref 0.00–0.07)
Basophils Absolute: 0 10*3/uL (ref 0.0–0.1)
Basophils Relative: 0 %
Eosinophils Absolute: 0.1 10*3/uL (ref 0.0–0.5)
Eosinophils Relative: 1 %
HCT: 35.8 % — ABNORMAL LOW (ref 36.0–46.0)
Hemoglobin: 11.4 g/dL — ABNORMAL LOW (ref 12.0–15.0)
Immature Granulocytes: 1 %
Lymphocytes Relative: 18 %
Lymphs Abs: 1.6 10*3/uL (ref 0.7–4.0)
MCH: 25.9 pg — ABNORMAL LOW (ref 26.0–34.0)
MCHC: 31.8 g/dL (ref 30.0–36.0)
MCV: 81.4 fL (ref 80.0–100.0)
Monocytes Absolute: 1.1 10*3/uL — ABNORMAL HIGH (ref 0.1–1.0)
Monocytes Relative: 12 %
Neutro Abs: 6 10*3/uL (ref 1.7–7.7)
Neutrophils Relative %: 68 %
Platelets: 221 10*3/uL (ref 150–400)
RBC: 4.4 MIL/uL (ref 3.87–5.11)
RDW: 15.4 % (ref 11.5–15.5)
WBC: 8.8 10*3/uL (ref 4.0–10.5)
nRBC: 0 % (ref 0.0–0.2)

## 2024-02-24 LAB — COMPREHENSIVE METABOLIC PANEL WITH GFR
ALT: 20 U/L (ref 0–44)
AST: 29 U/L (ref 15–41)
Albumin: 3 g/dL — ABNORMAL LOW (ref 3.5–5.0)
Alkaline Phosphatase: 98 U/L (ref 38–126)
Anion gap: 11 (ref 5–15)
BUN: 19 mg/dL (ref 8–23)
CO2: 23 mmol/L (ref 22–32)
Calcium: 9 mg/dL (ref 8.9–10.3)
Chloride: 101 mmol/L (ref 98–111)
Creatinine, Ser: 1 mg/dL (ref 0.44–1.00)
GFR, Estimated: 53 mL/min — ABNORMAL LOW (ref >60)
Glucose, Bld: 203 mg/dL — ABNORMAL HIGH (ref 70–99)
Potassium: 3.8 mmol/L (ref 3.5–5.1)
Sodium: 135 mmol/L (ref 135–145)
Total Bilirubin: 0.9 mg/dL (ref 0.0–1.2)
Total Protein: 6 g/dL — ABNORMAL LOW (ref 6.5–8.1)

## 2024-02-24 LAB — TROPONIN I (HIGH SENSITIVITY): Troponin I (High Sensitivity): 12 ng/L (ref <18)

## 2024-02-24 LAB — LIPASE, BLOOD: Lipase: 27 U/L (ref 11–51)

## 2024-02-24 LAB — CBG MONITORING, ED: Glucose-Capillary: 205 mg/dL — ABNORMAL HIGH (ref 70–99)

## 2024-02-24 MED ORDER — HALOPERIDOL LACTATE 5 MG/ML IJ SOLN
1.0000 mg | Freq: Four times a day (QID) | INTRAMUSCULAR | Status: DC | PRN
Start: 2024-02-24 — End: 2024-02-24

## 2024-02-24 MED ORDER — ACETAMINOPHEN 325 MG PO TABS
650.0000 mg | ORAL_TABLET | Freq: Once | ORAL | Status: AC
  Administered 2024-02-24: 650 mg via ORAL
  Filled 2024-02-24: qty 2

## 2024-02-24 MED ORDER — INSULIN ASPART 100 UNIT/ML IJ SOLN
0.0000 [IU] | Freq: Three times a day (TID) | INTRAMUSCULAR | Status: DC
  Administered 2024-02-25: 9 [IU] via SUBCUTANEOUS
  Administered 2024-02-25 – 2024-02-26 (×2): 5 [IU] via SUBCUTANEOUS
  Administered 2024-02-26: 7 [IU] via SUBCUTANEOUS

## 2024-02-24 MED ORDER — ENOXAPARIN SODIUM 30 MG/0.3ML IJ SOSY
30.0000 mg | PREFILLED_SYRINGE | INTRAMUSCULAR | Status: DC
  Administered 2024-02-25: 30 mg via SUBCUTANEOUS
  Filled 2024-02-24: qty 0.3

## 2024-02-24 MED ORDER — POLYETHYLENE GLYCOL 3350 17 G PO PACK
17.0000 g | PACK | Freq: Once | ORAL | Status: AC
  Administered 2024-02-24: 17 g via ORAL
  Filled 2024-02-24: qty 1

## 2024-02-24 MED ORDER — SODIUM CHLORIDE 0.9 % IV SOLN
1.0000 g | Freq: Once | INTRAVENOUS | Status: AC
  Administered 2024-02-24: 1 g via INTRAVENOUS
  Filled 2024-02-24: qty 10

## 2024-02-24 MED ORDER — LIDOCAINE 5 % EX PTCH
1.0000 | MEDICATED_PATCH | Freq: Every day | CUTANEOUS | Status: DC
  Administered 2024-02-24 – 2024-02-25 (×2): 1 via TRANSDERMAL
  Filled 2024-02-24 (×2): qty 1

## 2024-02-24 MED ORDER — DOCUSATE SODIUM 100 MG PO CAPS
100.0000 mg | ORAL_CAPSULE | Freq: Once | ORAL | Status: AC
  Administered 2024-02-24: 100 mg via ORAL
  Filled 2024-02-24: qty 1

## 2024-02-24 MED ORDER — IOHEXOL 350 MG/ML SOLN
75.0000 mL | Freq: Once | INTRAVENOUS | Status: AC | PRN
  Administered 2024-02-24: 75 mL via INTRAVENOUS

## 2024-02-24 MED ORDER — MELATONIN 3 MG PO TABS
3.0000 mg | ORAL_TABLET | Freq: Every day | ORAL | Status: DC
Start: 2024-02-24 — End: 2024-02-26
  Administered 2024-02-24 – 2024-02-25 (×2): 3 mg via ORAL
  Filled 2024-02-24 (×2): qty 1

## 2024-02-24 MED ORDER — ACETAMINOPHEN 500 MG PO TABS
1000.0000 mg | ORAL_TABLET | Freq: Four times a day (QID) | ORAL | Status: DC
  Administered 2024-02-24 – 2024-02-26 (×7): 1000 mg via ORAL
  Filled 2024-02-24 (×7): qty 2

## 2024-02-24 NOTE — ED Notes (Signed)
 Pt to CT

## 2024-02-24 NOTE — Assessment & Plan Note (Signed)
 Acute from fall.  Left third rib, nondisplaced.  Tender on exam.  The age-indeterminate fracture on chest x-ray. Start Tylenol  1 g every 6 hours while awake Start lidocaine  patch Start incentive spirometry

## 2024-02-24 NOTE — Assessment & Plan Note (Signed)
 On LAI (pending med rec for # units) and SSI at [facility].  Hold alogliptin-metformin  SSI, CBGs A1c

## 2024-02-24 NOTE — ED Notes (Signed)
 Pt to xray

## 2024-02-24 NOTE — ED Provider Notes (Signed)
 [place] [facility] AT [facility] Provider Note   CSN: [id number] Arrival date & time: 02/24/24  1416     History  Chief Complaint  Patient presents with   Hip Pain    left   Shoulder Pain    left   Fall    Kiara Blair is a 89 y.o. female.  HPI   89 year old female with medical history significant for HTN, DM2, prior CVA, no clear history of dementia however patient family states that she occasionally will sundown and get confused towards the end of the day, HLD presenting to the emergency department with multiple complaints.  Per EMS the patient had a fall at her facility 2 days ago and has been complaining of left shoulder pain and left hip pain.  The patient presents from [facility] in [place].  She is normally ambulatory with a walker at baseline.  She is not on any coagulation.  No known head trauma or loss of consciousness.  Per the patient's son over the phone, she has been more confused over the past few days and her urine was tested for possible urinary tract infection but results were not available yet.  Per the patient's other son who have now presents bedside, she is confused compared to her baseline.  She arrives GCS 14, ABC intact.  She complains of diffuse aches and pains throughout her body, complains of left chest wall pain, back pain.  Complained of shoulder and hip pain in triage.  Home Medications Prior to Admission medications   Medication Sig Start Date End Date Taking? Authorizing Provider  acetaminophen  (TYLENOL ) 325 MG tablet Take 2 tablets (650 mg total) by mouth every 6 (six) hours as needed. Patient taking differently: Take 650 mg by mouth as needed for moderate pain. 12/06/21   Teddi Favors, DO  Alogliptin Benzoate 25 MG TABS Take 1 tablet by mouth daily. 03/19/23   [provider]  Alogliptin-metFORMIN  HCl (KAZANO) 12.02-999 MG TABS Take 1 tablet by mouth in the morning and at bedtime.    [provider]   amLODipine -benazepril  (LOTREL) 5-20 MG per capsule Take 1 capsule by mouth at bedtime.    [provider]  aspirin  EC 81 MG tablet Take 1 tablet (81 mg total) by mouth daily. Swallow whole. 12/05/22   Haydee Lipa, MD  ASPIRIN  LOW DOSE 81 MG tablet Take 81 mg by mouth daily. 02/14/23   [provider]  atorvastatin  (LIPITOR) 40 MG tablet Take 1 tablet (40 mg total) by mouth daily. 02/08/21   Johny Nap, NP  Cholecalciferol (VITAMIN D3) 50 MCG (2000 UT) TABS Take 1 tablet by mouth daily. 01/01/23   [provider]  escitalopram (LEXAPRO) 5 MG tablet Take 5 mg by mouth daily. 01/26/23   [provider]  hydrOXYzine  (ATARAX ) 10 MG tablet Take 10 mg by mouth 2 (two) times daily. 12/04/22   [provider]  lidocaine  (XYLOCAINE ) 5 % ointment Apply 1 Application topically 4 (four) times daily as needed for moderate pain. Patient taking differently: Apply 1 Application topically as needed for moderate pain. 07/31/22   Long, Joshua G, MD  melatonin 3 MG TABS tablet Take 3 mg by mouth at bedtime. 01/15/23   [provider]  Multiple Vitamin (MULTIVITAMIN WITH MINERALS) TABS tablet Take 1 tablet by mouth in the morning and at bedtime. Dr. Whitakers Vitamins.    [provider]  NOVOLOG  FLEXPEN 100 UNIT/ML FlexPen Inject 4 Units into the skin in  the morning, at noon, and at bedtime. 01/08/23   [provider]  omeprazole  (PRILOSEC) 20 MG capsule Take 1 capsule (20 mg total) by mouth daily. 05/25/23   Almond Army, MD  PAIN RELIEF MAXIMUM STRENGTH 4 % Place 1 patch onto the skin daily. 12/22/22   [provider]  pregabalin  (LYRICA ) 100 MG capsule Take 1 capsule (100 mg total) by mouth 2 (two) times daily. 05/23/23   Wynetta Heckle, MD  propranolol  (INDERAL ) 10 MG tablet Take 10 mg by mouth 2 (two) times daily. 01/05/21   [provider]  traMADol  (ULTRAM ) 50 MG tablet To 2 tablets every 6 hours for pain control.  Take  with a dose of extra strength Tylenol . 05/23/23   Wynetta Heckle, MD      Allergies    Doxycycline hyclate, Levofloxacin, Meloxicam, Naproxen-esomeprazole mg, Nitrofurantoin, Other, and Pneumococcal polysaccharide vaccine    Review of Systems   Review of Systems  Unable to perform ROS: Mental status change    Physical Exam Updated Vital Signs BP (!) 142/67   Pulse 73   Temp 99 F (37.2 C) (Oral)   Resp 18   Ht 5\' 3"  (1.6 m)   SpO2 97%   BMI 21.79 kg/m  Physical Exam Vitals and nursing note reviewed.  Constitutional:      General: She is not in acute distress.    Appearance: She is well-developed.     Comments: GCS 15, ABC intact  HENT:     Head: Normocephalic and atraumatic.  Eyes:     Extraocular Movements: Extraocular movements intact.     Conjunctiva/sclera: Conjunctivae normal.     Pupils: Pupils are equal, round, and reactive to light.  Neck:     Comments: No midline tenderness to palpation of the cervical spine.  Range of motion intact Cardiovascular:     Rate and Rhythm: Normal rate and regular rhythm.     Heart sounds: No murmur heard. Pulmonary:     Effort: Pulmonary effort is normal. No respiratory distress.     Breath sounds: Normal breath sounds.  Chest:     Comments: Clavicles stable nontender to AP compression.  Chest wall stable and nontender to AP and lateral compression. Abdominal:     Palpations: Abdomen is soft.     Tenderness: There is no abdominal tenderness.     Comments: Pelvis stable to lateral compression  Musculoskeletal:     Cervical back: Neck supple.     Comments: No midline tenderness to palpation of the thoracic spine. Slight midline TTP of the lumbar spine.  Extremities atraumatic with intact range of motion  Skin:    General: Skin is warm and dry.  Neurological:     Mental Status: She is alert.     Comments: Cranial nerves II through XII grossly intact.  Moving all 4 extremities spontaneously.  Sensation grossly intact all 4  extremities     ED Results / Procedures / Treatments   Labs (all labs ordered are listed, but only abnormal results are displayed) Labs Reviewed  COMPREHENSIVE METABOLIC PANEL WITH GFR - Abnormal; Notable for the following components:      Result Value   Glucose, Bld 203 (*)    Total Protein 6.0 (*)    Albumin 3.0 (*)    GFR, Estimated 53 (*)    All other components within normal limits  CBC WITH DIFFERENTIAL/PLATELET - Abnormal; Notable for the following components:   Hemoglobin 11.4 (*)    HCT 35.8 (*)  MCH 25.9 (*)    Monocytes Absolute 1.1 (*)    All other components within normal limits  URINALYSIS, W/ REFLEX TO CULTURE (INFECTION SUSPECTED) - Abnormal; Notable for the following components:   APPearance HAZY (*)    Glucose, UA 50 (*)    Ketones, ur 5 (*)    Protein, ur 30 (*)    Nitrite POSITIVE (*)    Leukocytes,Ua LARGE (*)    Bacteria, UA MANY (*)    All other components within normal limits  URINE CULTURE  LIPASE, BLOOD  CBG MONITORING, ED  TROPONIN I (HIGH SENSITIVITY)    EKG None  Radiology CT ABDOMEN PELVIS W CONTRAST Result Date: 02/24/2024 CLINICAL DATA:  Abdominal pain, acute, nonlocalized, with back pain. EXAM: CT ABDOMEN AND PELVIS WITH CONTRAST CT LUMBAR SPINE WITHOUT CONTRAST TECHNIQUE: Multidetector CT imaging of the abdomen and pelvis was performed using the standard protocol following bolus administration of intravenous contrast. RADIATION DOSE REDUCTION: This exam was performed according to the departmental dose-optimization program which includes automated exposure control, adjustment of the mA and/or kV according to patient size and/or use of iterative reconstruction technique. CONTRAST:  75mL OMNIPAQUE  IOHEXOL  350 MG/ML SOLN COMPARISON:  CT abdomen pelvis with IV contrast 12/15/2021 and 06/09/2017. FINDINGS: CT ABDOMEN AND PELVIS WITH CONTRAST FINDINGS Lower chest: There is a trace left pleural effusion, new. Subsegmental atelectasis in the  posterior lung bases without infiltrate. Interval increased moderate-sized hiatal hernia. The cardiac size is normal. Hepatobiliary: Old cholecystectomy. Unchanged mild intrahepatic and extrahepatic biliary prominence. There is stable prominence of the hepatic portal vein measuring 16 mm. The liver parenchyma is unremarkable. Pancreas: Chronic moderate pancreatic atrophy. There are 2 adjacent new hypodense lesions in the body/tail section of the pancreas, on 3:17 measuring 9 mm and 34 Hounsfield units, on 3:16 measuring 10 mm and 9 Hounsfield units. 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 Pancreas dedicated MRI is recommended for further characterization. Remainder of the pancreas is unremarkable. There is no main ductal dilatation. Spleen: No abnormality.  No splenomegaly. Adrenals/Urinary Tract: No adrenal mass. There is no renal mass enhancement. There is cortical scarring in the lower lateral right kidney. Also in the right lower pole, again noted 6 mm Bosniak 2 cyst which is too small to characterize but unchanged. No follow-up imaging is recommended. There is no urinary stone or obstruction. The kidneys excrete symmetrically on the delayed images. The bladder is diffusely thickened with perivesical stranding consistent with cystitis. No CT evidence for ascending UTI. Stomach/Bowel: As above there is a moderate-sized hiatal hernia. The stomach is contracted. No bowel dilatation or wall thickening is seen. An appendix is not seen in this patient. There is mild-to-moderate fecal stasis. There is advanced sigmoid diverticulosis without evidence of acute diverticulitis. Vascular/Lymphatic: Aortic atherosclerosis. No enlarged abdominal or pelvic lymph nodes. Reproductive: Status post hysterectomy. No adnexal masses. Other: Pelvic phleboliths. Trace pelvic ascites. Small supraumbilical fat hernia. No incarcerated hernia. There is no free hemorrhage, free air or abscess. Musculoskeletal:  Mild dextroscoliosis, degenerative changes of the spine and osteopenia. No acute or other take significant osseous findings. CT LUMBAR SPINE WITHOUT CONTRAST FINDINGS Segmentation: Standard. Alignment: Mild dextroscoliosis apex L1.  No AP listhesis. Vertebrae: Diffuse osteopenia, not significantly changed. There is no evidence of fractures or focal pathologic process. There is moderate lumbar spondylosis but no bulky or bridging osteophytes. The sacrum is intact. Paraspinal and other soft tissues: No paraspinal mass or fluid collections. Disc levels: T11-12 through L1-2: These discs  are chronically collapsed with vacuum phenomenon. There are circumferential disc osteophyte complexes, which posteriorly partially efface the ventral thecal sac. At T11-12, in combination with ossified thickened dorsal ligaments this causes mild-to-moderate spinal canal stenosis, stable. There are facet osteophytes, with moderate to severe acquired foraminal stenosis at T11-12, moderate left foraminal stenosis at T12-L1, moderate to severe left and mild right foraminal stenosis at L1-2. L2-3: There is mild-to-moderate disc space loss is centric to the left, with vacuum phenomena. Circumferential disc osteophyte complex. There are moderate facet osteophytes in combination with disc osteophyte complex moderately effacing the lateral recesses causing mild spinal canal stenosis and possible mass effect on the descending L3 nerve roots. There is severe left and mild-to-moderate right foraminal stenosis. L3-4: There is mild disc space loss with vacuum phenomenon. Broad-based disc osteophyte complex and moderate facet osteophytes are noted, causing subarticular recess effacement and mild spinal stenosis, likely mass effect on the descending L4 nerve roots. There is severe right and moderate to severe left foraminal stenosis. L4-5: There is mild disc space loss with vacuum phenomenon. Due to a combination of advanced right and moderate left  facet hypertrophy, and posterior disc osteophyte complex there is moderate spinal canal stenosis with effacement of both subarticular recesses, with likely mass effect on the descending L5 nerve roots. There is severe right, moderate to severe left foraminal stenosis. L5-S1: This disc is moderately collapsed, as before, with slight vacuum phenomenon. There is moderate facet hypertrophy and posterior osteophytes with mass effect on the S1 nerve roots and mild spinal canal stenosis. There is bilaterally severe foraminal stenosis. Other: Both SI joints are patent, with spurring and vacuum phenomenon. IMPRESSION: 1. Cystitis without CT evidence for ascending UTI. 2. Constipation and diverticulosis. 3. New trace left pleural effusion. 4. Increased moderate-sized hiatal hernia. 5. Aortic atherosclerosis. 6. 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 Pancreas dedicated MRI is recommended for further characterization if clinically warranted taking into consideration advanced age. 7. Trace pelvic ascites. 8. Small supraumbilical fat hernia. 9. Osteopenia, scoliosis and degenerative change without evidence of lumbar fractures. 10. Multilevel spinal canal, subarticular recess and foraminal stenosis due to spondylosis, facet DJD and dextroscoliosis. Similar findings 2 years ago. Aortic Atherosclerosis (ICD10-I70.0). Electronically Signed   By: Denman Fischer M.D.   On: 02/24/2024 20:33   CT L-SPINE NO CHARGE Result Date: 02/24/2024 CLINICAL DATA:  Abdominal pain, acute, nonlocalized, with back pain. EXAM: CT ABDOMEN AND PELVIS WITH CONTRAST CT LUMBAR SPINE WITHOUT CONTRAST TECHNIQUE: Multidetector CT imaging of the abdomen and pelvis was performed using the standard protocol following bolus administration of intravenous contrast. RADIATION DOSE REDUCTION: This exam was performed according to the departmental dose-optimization program which  includes automated exposure control, adjustment of the mA and/or kV according to patient size and/or use of iterative reconstruction technique. CONTRAST:  75mL OMNIPAQUE  IOHEXOL  350 MG/ML SOLN COMPARISON:  CT abdomen pelvis with IV contrast 12/15/2021 and 06/09/2017. FINDINGS: CT ABDOMEN AND PELVIS WITH CONTRAST FINDINGS Lower chest: There is a trace left pleural effusion, new. Subsegmental atelectasis in the posterior lung bases without infiltrate. Interval increased moderate-sized hiatal hernia. The cardiac size is normal. Hepatobiliary: Old cholecystectomy. Unchanged mild intrahepatic and extrahepatic biliary prominence. There is stable prominence of the hepatic portal vein measuring 16 mm. The liver parenchyma is unremarkable. Pancreas: Chronic moderate pancreatic atrophy. There are 2 adjacent new hypodense lesions in the body/tail section of the pancreas, on 3:17 measuring 9 mm and  34 Hounsfield units, on 3:16 measuring 10 mm and 9 Hounsfield units. 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 Pancreas dedicated MRI is recommended for further characterization. Remainder of the pancreas is unremarkable. There is no main ductal dilatation. Spleen: No abnormality.  No splenomegaly. Adrenals/Urinary Tract: No adrenal mass. There is no renal mass enhancement. There is cortical scarring in the lower lateral right kidney. Also in the right lower pole, again noted 6 mm Bosniak 2 cyst which is too small to characterize but unchanged. No follow-up imaging is recommended. There is no urinary stone or obstruction. The kidneys excrete symmetrically on the delayed images. The bladder is diffusely thickened with perivesical stranding consistent with cystitis. No CT evidence for ascending UTI. Stomach/Bowel: As above there is a moderate-sized hiatal hernia. The stomach is contracted. No bowel dilatation or wall thickening is seen. An appendix is not seen in this patient. There is mild-to-moderate fecal  stasis. There is advanced sigmoid diverticulosis without evidence of acute diverticulitis. Vascular/Lymphatic: Aortic atherosclerosis. No enlarged abdominal or pelvic lymph nodes. Reproductive: Status post hysterectomy. No adnexal masses. Other: Pelvic phleboliths. Trace pelvic ascites. Small supraumbilical fat hernia. No incarcerated hernia. There is no free hemorrhage, free air or abscess. Musculoskeletal: Mild dextroscoliosis, degenerative changes of the spine and osteopenia. No acute or other take significant osseous findings. CT LUMBAR SPINE WITHOUT CONTRAST FINDINGS Segmentation: Standard. Alignment: Mild dextroscoliosis apex L1.  No AP listhesis. Vertebrae: Diffuse osteopenia, not significantly changed. There is no evidence of fractures or focal pathologic process. There is moderate lumbar spondylosis but no bulky or bridging osteophytes. The sacrum is intact. Paraspinal and other soft tissues: No paraspinal mass or fluid collections. Disc levels: T11-12 through L1-2: These discs are chronically collapsed with vacuum phenomenon. There are circumferential disc osteophyte complexes, which posteriorly partially efface the ventral thecal sac. At T11-12, in combination with ossified thickened dorsal ligaments this causes mild-to-moderate spinal canal stenosis, stable. There are facet osteophytes, with moderate to severe acquired foraminal stenosis at T11-12, moderate left foraminal stenosis at T12-L1, moderate to severe left and mild right foraminal stenosis at L1-2. L2-3: There is mild-to-moderate disc space loss is centric to the left, with vacuum phenomena. Circumferential disc osteophyte complex. There are moderate facet osteophytes in combination with disc osteophyte complex moderately effacing the lateral recesses causing mild spinal canal stenosis and possible mass effect on the descending L3 nerve roots. There is severe left and mild-to-moderate right foraminal stenosis. L3-4: There is mild disc space loss  with vacuum phenomenon. Broad-based disc osteophyte complex and moderate facet osteophytes are noted, causing subarticular recess effacement and mild spinal stenosis, likely mass effect on the descending L4 nerve roots. There is severe right and moderate to severe left foraminal stenosis. L4-5: There is mild disc space loss with vacuum phenomenon. Due to a combination of advanced right and moderate left facet hypertrophy, and posterior disc osteophyte complex there is moderate spinal canal stenosis with effacement of both subarticular recesses, with likely mass effect on the descending L5 nerve roots. There is severe right, moderate to severe left foraminal stenosis. L5-S1: This disc is moderately collapsed, as before, with slight vacuum phenomenon. There is moderate facet hypertrophy and posterior osteophytes with mass effect on the S1 nerve roots and mild spinal canal stenosis. There is bilaterally severe foraminal stenosis. Other: Both SI joints are patent, with spurring and vacuum phenomenon. IMPRESSION: 1. Cystitis without CT evidence for ascending UTI. 2. Constipation and diverticulosis. 3. New trace left pleural effusion. 4. Increased  moderate-sized hiatal hernia. 5. Aortic atherosclerosis. 6. 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 Pancreas dedicated MRI is recommended for further characterization if clinically warranted taking into consideration advanced age. 7. Trace pelvic ascites. 8. Small supraumbilical fat hernia. 9. Osteopenia, scoliosis and degenerative change without evidence of lumbar fractures. 10. Multilevel spinal canal, subarticular recess and foraminal stenosis due to spondylosis, facet DJD and dextroscoliosis. Similar findings 2 years ago. Aortic Atherosclerosis (ICD10-I70.0). Electronically Signed   By: Denman Fischer M.D.   On: 02/24/2024 20:33   DG Lumbar Spine Complete Result Date: 02/24/2024 CLINICAL  DATA:  fall, low back pain EXAM: LUMBAR SPINE - COMPLETE 4+ VIEW COMPARISON:  March 14, 2020, May 25, 2023 FINDINGS: Osteopenia. There are five non-rib bearing lumbar-type vertebral bodies. There is dextrocurvature of the lumbar spine. There is mild retrolisthesis of L3-4 no definitive acute compression fracture deformity although evaluation is limited on cross-table lateral. Severe multilevel intervertebral disc space height loss with multilevel asymmetric disc space height loss throughout the lumbar spine. Facet arthropathy. Sacrum is obscured by overlapping bowel contents. IMPRESSION: 1. No definitive acute compression fracture deformity although evaluation is limited on cross-table lateral. If persistent clinical concern, recommend dedicated cross-sectional imaging. 2. Severe multilevel degenerative changes of the lumbar spine. Electronically Signed   By: Clancy Crimes M.D.   On: 02/24/2024 18:12   DG Abdomen 1 View Result Date: 02/24/2024 CLINICAL DATA:  fall, low back pain EXAM: ABDOMEN - 1 VIEW COMPARISON:  May 25, 2023. FINDINGS: Air and stool filled nondilated loops of bowel. Multilevel degenerative changes of the lumbar spine. Sacrum is obscured by overlapping bowel contents. Status post cholecystectomy. Vascular calcifications. IMPRESSION: Nonobstructive bowel gas pattern. Electronically Signed   By: Clancy Crimes M.D.   On: 02/24/2024 18:08   DG Chest 1 View Result Date: 02/24/2024 CLINICAL DATA:  [id number] AMS (altered mental status) [id number] EXAM: CHEST  1 VIEW COMPARISON:  April 21, 2023 FINDINGS: The cardiomediastinal silhouette is unchanged in contour.Atherosclerotic calcifications of the tortuous thoracic aorta. No pleural effusion. No pneumothorax. No acute pleuroparenchymal abnormality. Age-indeterminate fracture of the LEFT lateral third rib. IMPRESSION: Age-indeterminate fracture of the LEFT lateral third rib. Recommend correlation with point tenderness. Electronically Signed    By: Clancy Crimes M.D.   On: 02/24/2024 18:07   CT HEAD WO CONTRAST Result Date: 02/24/2024 CLINICAL DATA:  Mental status change. EXAM: CT HEAD WITHOUT CONTRAST TECHNIQUE: Contiguous axial images were obtained from the base of the skull through the vertex without intravenous contrast. RADIATION DOSE REDUCTION: This exam was performed according to the departmental dose-optimization program which includes automated exposure control, adjustment of the mA and/or kV according to patient size and/or use of iterative reconstruction technique. COMPARISON:  Head CT 06/10/2023 FINDINGS: Brain: No intracranial hemorrhage, mass effect, or midline shift. Normal for age atrophy. No hydrocephalus. The basilar cisterns are patent. Moderate chronic small vessel ischemic change. No evidence of territorial infarct or acute ischemia. No extra-axial or intracranial fluid collection. Vascular: Atherosclerosis of skullbase vasculature without hyperdense vessel or abnormal calcification. Skull: No fracture or focal lesion. Sinuses/Orbits: Chronic paranasal sinus mucosal thickening, although worsened from prior exam. Minimal fluid level in the left maxillary sinus. No mastoid effusion. Other: None. IMPRESSION: 1. No acute intracranial abnormality. 2. Moderate chronic small vessel ischemic change. 3. Chronic paranasal sinus mucosal thickening, although worsened from prior exam. Minimal fluid level in the left maxillary sinus. Electronically Signed   By: Prentice Brochure  Sanford M.D.   On: 02/24/2024 16:50   DG Shoulder Left Result Date: 02/24/2024 CLINICAL DATA:  Left shoulder pain after fall. EXAM: LEFT SHOULDER - 2+ VIEW COMPARISON:  None Available. FINDINGS: There is no evidence of fracture or dislocation. Severe narrowing of glenohumeral joint is noted. Narrowing of subacromial space is noted suggesting chronic rotator cuff injury. Soft tissues are unremarkable. IMPRESSION: Severe osteoarthritis of left glenohumeral joint. Narrowing  of subacromial space is noted suggesting chronic rotator cuff injury. No acute abnormality seen. Electronically Signed   By: Rosalene Colon M.D.   On: 02/24/2024 15:52   DG Hip Unilat W or Wo Pelvis 2-3 Views Left Result Date: 02/24/2024 CLINICAL DATA:  Left hip pain after fall. EXAM: DG HIP (WITH OR WITHOUT PELVIS) 2-3V LEFT COMPARISON:  June 10, 2023. FINDINGS: There is no evidence of hip fracture or dislocation. Mild osteophyte formation of left hip is noted. IMPRESSION: Mild degenerative joint disease of left hip. No acute abnormality seen. Electronically Signed   By: Rosalene Colon M.D.   On: 02/24/2024 15:51    Procedures Procedures    Medications Ordered in ED Medications  acetaminophen  (TYLENOL ) tablet 650 mg (650 mg Oral Given 02/24/24 1602)  cefTRIAXone  (ROCEPHIN ) 1 g in sodium chloride  0.9 % 100 mL IVPB (0 g Intravenous Stopped 02/24/24 1833)  iohexol  (OMNIPAQUE ) 350 MG/ML injection 75 mL (75 mLs Intravenous Contrast Given 02/24/24 1952)    ED Course/ Medical Decision Making/ A&P                                 Medical Decision Making Amount and/or Complexity of Data Reviewed Labs: ordered. Radiology: ordered.  Risk OTC drugs. Prescription drug management. Decision regarding hospitalization.     89 year old female with medical history significant for HTN, DM2, prior CVA, no clear history of dementia however patient family states that she occasionally will sundown and get confused towards the end of the day, HLD presenting to the emergency department with multiple complaints.  Per EMS the patient had a fall at her facility 2 days ago and has been complaining of left shoulder pain and left hip pain.  The patient presents from [facility] in [place].  She is normally ambulatory with a walker at baseline.  She is not on any coagulation.  No known head trauma or loss of consciousness.  Per the patient's son over the phone, she has been more confused over the past few  days and her urine was tested for possible urinary tract infection but results were not available yet.  Per the patient's other son who have now presents bedside, she is confused compared to her baseline.  She arrives GCS 14, ABC intact.  She complains of diffuse aches and pains throughout her body, complains of left chest wall pain, back pain.  Complained of shoulder and hip pain in triage.  On arrival, the patient was afebrile, temperature 99.3, not tachycardic heart rate 68, BP 130/93, respiratory rate 16, saturating 98% on room air.  Physical exam revealed some lumbar tenderness to palpation, extremities atraumatic, no other acute changes.  Patient x-ray imaging from the triage process of the hips and shoulder and pelvis negative for acute fracture or dislocation, show degenerative changes.  Chest x-ray showed no acute cardiac or pulmonary abnormality, did show an age-indeterminate fracture of the ribs on the left and this correlates with point tenderness consistent with acute rib fracture.Aaron Aas  CT  of the head showed no acute findings.  CT Abd Pel with Lumbar Reformats: IMPRESSION:  1. Cystitis without CT evidence for ascending UTI.  2. Constipation and diverticulosis.  3. New trace left pleural effusion.  4. Increased moderate-sized hiatal hernia.  5. Aortic atherosclerosis.  6. 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  Pancreas dedicated MRI is recommended for further characterization  if clinically warranted taking into consideration advanced age.  7. Trace pelvic ascites.  8. Small supraumbilical fat hernia.  9. Osteopenia, scoliosis and degenerative change without evidence of  lumbar fractures.  10. Multilevel spinal canal, subarticular recess and foraminal  stenosis due to spondylosis, facet DJD and dextroscoliosis. Similar  findings 2 years ago.   Laboratory evaluation showed evidence of UTI for which  the patient was administered IV Rocephin .  CBC, no leukocytosis, mild anemia to 11.4, cardiac troponin 12, lipase normal, CMP with hyperglycemia, otherwise generally unremarkable.  In the setting of the patient's worsening mental status, UTI, medicine was consulted for admission, family practice management accepting the patient in admission.  Family were updated bedside regarding the plan of care and were in agreement.  Final Clinical Impression(s) / ED Diagnoses Final diagnoses:  Fall, initial encounter  Altered mental status, unspecified altered mental status type  Lower urinary tract infectious disease  Closed fracture of one rib of left side, initial encounter    Rx / DC Orders ED Discharge Orders     None         Rosealee Concha, MD 02/24/24 2053

## 2024-02-24 NOTE — Hospital Course (Addendum)
 Caitland Imel is a 89 y.o. female with history of dementia, HTN, HLD, T2DM, TIA, tremors who was admitted to the [facility] Teaching Service at [facility] for AMS, falls, found to have rib fracture.  AMS Patient with multiple falls at her assisted living facility.  On presentation to the ED she had "pain all over" and was acutely confused.  CXR with age-indeterminate fracture of the left third rib. CT lumbar spine and CT A/P with cystitis, constipation.  Shoulder, hip, and abdomen x-rays without acute abnormality. Acute decline in mental status thought to be due to metabolic encephalopathy in setting of UTI. May also be contributed to by previously suspected dementia. Will need outpatient evaluation and family counseling on dementia.   Complicated UTI Patient was treated in the outpatient setting for UTI and completed a full course of Augmentin approximately 2 weeks prior to this hospitalization.  On arrival to the ED she was afebrile, without leukocytosis.  She was completed treatment on ceftriaxone  (5/18-5/20). Urine culture showed E,coli.  When culture sensitivities returned antibiotics her treatment was completed on ceftriazxone.  Rib fracture Age-indeterminate fracture of the left third rib on imaging as above.  Likely from falls at living facility.  Patient received pain management.  Abdominal pain Imaging with evidence of constipation as above.  Abdominal pain also potentially related to UTI.  Patient received MiraLAX . Pain resolved with Miralax  and UTI treatment during hospitalization.   HTN Home blood pressure medications were held during hospitalization. Restarted home propranolol  at time of discharge and held home amlodipine  in setting of variable diastolic blood pressures.    PCP follow-up recommendations:  1. Obtain outpatient Pancreatic MRI to characterize pancreatic IPMN 2. Recommend outpatient follow up with Palliative Medicine to continue discussion of GOC, dementia progression,  etc. 3. Repeat BP and evaluate restarting amlodipine  if diastolic BP will tolerate.

## 2024-02-24 NOTE — ED Notes (Signed)
 BED ALARM Placed for safety

## 2024-02-24 NOTE — Assessment & Plan Note (Addendum)
 Subjectively per patient, no significant tenderness to light palpation on exam, no fever/leukocytosis. Pain seems to worsen with positional change. Suspect possible MSK given recent fall vs Constipation seen on abdominal CT. Unable to clarify reflux, or blood stools.  Last BM this morning was normal. Lipase and Troponin obtained in ED was normal . Refused all colonoscopies in past. 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 Recommend outpatient MRI for better characterize lesion. Follow up FOBT  Start laxatives for constipation Miralax  once Colase once Regimen per morning team Consider CT angiogram to rule out bleeding Recommend outpatient Pancreatic MRI for IPMN

## 2024-02-24 NOTE — Assessment & Plan Note (Signed)
 Recent UTI 2 weeks ago, treated with augmentin per pharmacy record without improvement.  UA on admission consistent with UTI.  Patient unable to clarify symptoms.  Hemodynamically stable, no fever, no leukocytosis.  Received ceftriaxone  1 g in ED. cystitis without ascending UTI on imaging. Redose ceftriaxone  per day team Monitor fever curve, WBC

## 2024-02-24 NOTE — H&P (Addendum)
 Hospital Admission History and Physical Service Pager: [phone]  Patient name: Kiara Blair Medical record number: [id number] Date of Birth: 1930/02/26 Age: 89 y.o. Gender: female  Primary Care Provider: Mayer Speaker, PA Consultants: None Code Status: DNR, prearrest interventions desired Preferred Emergency Contact:  Kiara Blair, son, [phone] Kiara Blair, son, [phone]  Chief Complaint: Altered mental status, fall, left chest wall pain, abdominal pain  Assessment and Plan: Kiara Blair is a 89 y.o. female presenting with altered mental status x 1 week, falls, left chest wall pain, and abdominal pain. Differential for abdominal pain include diverticulitis, constipation, referred pain from UTI/rib fracture, gastritis/PUD, bowel obstruction, MSK from fall.  MSK from fall and referred pain from UTI and refracture most likely as patient is mostly pain when she is moving but does not have focal abdominal tenderness.  Gastritis/PUD is common but patient is unable to give history to clarify if she is experiencing this.  Patient has constipation on imaging and is not on a laxatives per home meds.  Low suspicion for bowel obstruction but cannot rule out as patient can clarify her last BM.  Assessment & Plan Altered mental status Not at baseline per family in last week, waxing/waning, sundowning more, disoriented, falling.  On interview only oriented to self but attention intact.  Nonfocal neurologic exam.  Short-term memory impaired but chronic.  Best explained by acute UTI on UA.  Swallowing fine. Admit to FMTS, attending Dr. Johnanna Blair, Vital signs per floor Carb modified diet  PT/OT to treat AM CBC/BMP  Fall precautions Delirium precautions Treat UTI per below Evaluate for reversible causes with RPR, B12, Folate, TSH Complicated UTI (urinary tract infection) Recent UTI 2 weeks ago, treated with augmentin per pharmacy record without improvement.  UA on admission  consistent with UTI.  Patient unable to clarify symptoms.  Hemodynamically stable, no fever, no leukocytosis.  Received ceftriaxone  1 g in ED. cystitis without ascending UTI on imaging. Redose ceftriaxone  per day team Monitor fever curve, WBC Abdominal pain Subjectively per patient, no significant tenderness to light palpation on exam, no fever/leukocytosis. Pain seems to worsen with positional change. Suspect possible MSK given recent fall vs Constipation seen on abdominal CT. Unable to clarify reflux, or blood stools.  Last BM this morning was normal. Lipase and Troponin obtained in ED was normal . Refused all colonoscopies in past. 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 Recommend outpatient MRI for better characterize lesion. Follow up FOBT  Start laxatives for constipation Miralax  once Colase once Regimen per morning team Consider CT angiogram to rule out bleeding Recommend outpatient Pancreatic MRI for IPMN  Rib fracture Acute from fall.  Left third rib, nondisplaced.  Tender on exam.  The age-indeterminate fracture on chest x-ray. Start Tylenol  1 g every 6 hours while awake Start lidocaine  patch Start incentive spirometry Fall In context of recent altered mentation per family.  Tenderness over left chest wall but no other focal tenderness on exam (limited exam of back due to patient mobility).  Negative x-ray left shoulder, left hip. no changes on CT lumbar spine.  Negative CT head. - Fall Precaution  T2DM (type 2 diabetes mellitus) (HCC) On LAI (pending med rec for # units) and SSI at [facility].  Hold alogliptin-metformin  SSI, CBGs A1c Chronic health problem Follow up med rec in morning Insomnia: Continue home melatonin   FEN/GI: Carb modified VTE Prophylaxis: Lovenox   Disposition: MedSurg  History of Present Illness:  Kiara Blair is a 89 y.o. female presenting with waxing waning in mentation per  family, recent falls, pain on left chest, left shoulder, left hip.  Per family patient had recently had a UTI and was treated but had not been "tested for resolution" per family.  They report that whenever she gets UTIs she has some changes in her mentation including worsening sundowning, waxing waning of her mentation, disorientation.  This episode she was having the same symptoms so they are assuming that her UTI was not treated.  Additionally today reports that patient had several falls recently and that she does not normally fall this often.  They report this also was common whenever she has UTIs.  They have difficulty and describing when her last fall was but reported that she had several.  Who reports that she was having some pain since her most recent fall including her left shoulder, left hip, left chest wall, and also rating abdominal pain from her epigastric area to remainder of abdomen.  Patient was unable to confirm this or clarify this further.  Patient is not report any specific pain other than her left chest area.  In the ED, pain all over after fall 2 days ago. Acutely confused. Thinks that she has dementia. Acute encephalopathy admission. Abdominal tenderness but low concern.   Review Of Systems: Per HPI with the following additions: Patient unable to clarify due to altered mentation.  Pertinent Past Medical History: T2DM HLD HTN Hx TIA  Remainder reviewed in history tab.   Pertinent Past Surgical History:    Remainder reviewed in history tab.  Pertinent Social History: Tobacco use: never Alcohol use: never Other Substance use: never Lives with [facility]  Pertinent Family History: CAD in father  Remainder reviewed in history tab.   Important Outpatient Medications: Patient and son do not know.  Pending med rec pending fax from [facility].   Remainder reviewed in medication history.   Objective: BP (!) 142/67   Pulse 73   Temp 99 F (37.2 C) (Oral)    Resp 18   Ht 5\' 3"  (1.6 m)   SpO2 97%   BMI 21.79 kg/m  Exam: General: Calm, cooperative, laying in bed, alert, son at bedside, not in acute distress, not ill-appearing Eyes: No obvious abnormalities, no scleral icterus ENTM: Wet mucous membranes, no obvious URI symptoms, no obvious abnormalities Neck: Supple, no obvious trauma Cardiovascular: Normal rate and rhythm.  Normal heart sounds. Respiratory: Limited by patient's mobility but clear to auscultation listening briefly posteriorly and listening anteriorly.  Normal effort on room air. Gastrointestinal: Bowel sounds present.  Nondistended.  Soft.  Nontender to light and deep palpation. MSK: Left chest wall tenderness to light palpation.  No tenderness to palpation of joints throughout. Derm: No no obvious ecchymosis throughout.  Actinic keratoses present. Neuro: Alert, oriented to person but not place or time or situation.  4+/5 strength in all 4 extremities.  Sensation intact and symmetric bilaterally.  Cranial nerves intact. Psych: Affect and behavior appropriate situation.  Labs:  CBC BMET  Recent Labs  Lab 02/24/24 1700  WBC 8.8  HGB 11.4*  HCT 35.8*  PLT 221   Recent Labs  Lab 02/24/24 1700  NA 135  K 3.8  CL 101  CO2 23  BUN 19  CREATININE 1.00  GLUCOSE 203*  CALCIUM  9.0     Urinalysis: WBC 21-50, many bacteria, leukocytes large, nitrite positive, protein 30, ketone 5, glucose 50 Urine culture: In process Troponin: 12 Lipase:  27  EKG: normal sinus rhythm. Lots of artifact. No significant changes from previous EKG.    Imaging Studies Performed:  DG shoulder left 5/18 There is no evidence of fracture or dislocation. Severe narrowing of  glenohumeral joint is noted. Narrowing of subacromial space is noted.  DG hip left 5/18 Mild degenerative joint disease of left hip. No acute abnormality seen.  DG abdomen 5/18 Nonobstructive bowel gas pattern.  Chest x-ray 5/18 Radiologist read: Age-indeterminate  fracture of the LEFT lateral third rib.  My interpretation: Difficult to visualize any rib fractures, no pneumothorax, no pleural effusion, no pulm edema  CT lumbar spine without contrast, CT abdomen pelvis with contrast 5/18 1. Cystitis without CT evidence for ascending UTI. 2. Constipation and diverticulosis. 3. New trace left pleural effusion. 4. Increased moderate-sized hiatal hernia. 5. Aortic atherosclerosis. 6. 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 Pancreas dedicated MRI is recommended for further characterization if clinically warranted taking into consideration advanced age. 7. Trace pelvic ascites. 8. Small supraumbilical fat hernia. 9. Osteopenia, scoliosis and degenerative change without evidence of lumbar fractures. 10. Multilevel spinal canal, subarticular recess and foraminal stenosis due to spondylosis, facet DJD and dextroscoliosis. Similar findings 2 years ago.   Verdell Given, MD 02/24/2024, 8:09 PM PGY-1, [facility]  FPTS Intern pager: [phone], text pages welcome Secure chat group [facility] Teaching Service   I was personally present and re-performed the exam. I verified the service and findings are accurately documented in the intern's note and made changes within note.  Goble Last, MD 02/25/2024 6:36 AM   Goble Last, MD PGY-3, [facility] Resident  Please page [phone] with questions.

## 2024-02-24 NOTE — ED Triage Notes (Signed)
 Pt to ED c/o fall two days ago and now has left shoulder pain and left hip pain. Pt from Facility; [facility] [place].  Pt ambulatory with walker at baseline. Pt not on blood thinner. Hx dementia, A&O x4 with EMS.   Tremors at baseline.  No medications given by EMS.  LAST VS: 128/70, p 65, 96%RA, CBG 208. Temp 97.8

## 2024-02-24 NOTE — Assessment & Plan Note (Addendum)
 Not at baseline per family in last week, waxing/waning, sundowning more, disoriented, falling.  On interview only oriented to self but attention intact.  Nonfocal neurologic exam.  Short-term memory impaired but chronic.  Best explained by acute UTI on UA.  Swallowing fine. Admit to FMTS, attending Dr. Johnanna Mylar, Vital signs per floor Carb modified diet  PT/OT to treat AM CBC/BMP  Fall precautions Delirium precautions Treat UTI per below Evaluate for reversible causes with RPR, B12, Folate, TSH

## 2024-02-24 NOTE — Assessment & Plan Note (Addendum)
 In context of recent altered mentation per family.  Tenderness over left chest wall but no other focal tenderness on exam (limited exam of back due to patient mobility).  Negative x-ray left shoulder, left hip. no changes on CT lumbar spine.  Negative CT head. - Fall Precaution

## 2024-02-24 NOTE — Assessment & Plan Note (Signed)
 Follow up med rec in morning Insomnia: Continue home melatonin

## 2024-02-25 ENCOUNTER — Other Ambulatory Visit: Payer: Self-pay

## 2024-02-25 DIAGNOSIS — S2232XA Fracture of one rib, left side, initial encounter for closed fracture: Secondary | ICD-10-CM | POA: Diagnosis not present

## 2024-02-25 DIAGNOSIS — R4182 Altered mental status, unspecified: Secondary | ICD-10-CM | POA: Diagnosis not present

## 2024-02-25 DIAGNOSIS — N390 Urinary tract infection, site not specified: Secondary | ICD-10-CM | POA: Diagnosis not present

## 2024-02-25 DIAGNOSIS — W19XXXA Unspecified fall, initial encounter: Secondary | ICD-10-CM | POA: Diagnosis not present

## 2024-02-25 LAB — HEMOGLOBIN A1C
Hgb A1c MFr Bld: 8.3 % — ABNORMAL HIGH (ref 4.8–5.6)
Mean Plasma Glucose: 191.51 mg/dL

## 2024-02-25 LAB — BASIC METABOLIC PANEL WITH GFR
Anion gap: 9 (ref 5–15)
BUN: 15 mg/dL (ref 8–23)
CO2: 24 mmol/L (ref 22–32)
Calcium: 8.8 mg/dL — ABNORMAL LOW (ref 8.9–10.3)
Chloride: 101 mmol/L (ref 98–111)
Creatinine, Ser: 0.82 mg/dL (ref 0.44–1.00)
GFR, Estimated: >60 mL/min (ref >60)
Glucose, Bld: 241 mg/dL — ABNORMAL HIGH (ref 70–99)
Potassium: 4.1 mmol/L (ref 3.5–5.1)
Sodium: 134 mmol/L — ABNORMAL LOW (ref 135–145)

## 2024-02-25 LAB — CBC
HCT: 34.7 % — ABNORMAL LOW (ref 36.0–46.0)
Hemoglobin: 11.3 g/dL — ABNORMAL LOW (ref 12.0–15.0)
MCH: 26.3 pg (ref 26.0–34.0)
MCHC: 32.6 g/dL (ref 30.0–36.0)
MCV: 80.7 fL (ref 80.0–100.0)
Platelets: 241 10*3/uL (ref 150–400)
RBC: 4.3 MIL/uL (ref 3.87–5.11)
RDW: 15.4 % (ref 11.5–15.5)
WBC: 7.6 10*3/uL (ref 4.0–10.5)
nRBC: 0 % (ref 0.0–0.2)

## 2024-02-25 LAB — GLUCOSE, CAPILLARY
Glucose-Capillary: 352 mg/dL — ABNORMAL HIGH (ref 70–99)
Glucose-Capillary: 256 mg/dL — ABNORMAL HIGH (ref 70–99)
Glucose-Capillary: 89 mg/dL (ref 70–99)
Glucose-Capillary: 246 mg/dL — ABNORMAL HIGH (ref 70–99)

## 2024-02-25 LAB — FOLATE: Folate: 34.6 ng/mL (ref >5.9)

## 2024-02-25 LAB — HIV ANTIBODY (ROUTINE TESTING W REFLEX): HIV Screen 4th Generation wRfx: NONREACTIVE

## 2024-02-25 LAB — TSH: TSH: 2.536 u[IU]/mL (ref 0.350–4.500)

## 2024-02-25 LAB — VITAMIN B12: Vitamin B-12: 359 pg/mL (ref 180–914)

## 2024-02-25 LAB — RPR: RPR Ser Ql: NONREACTIVE

## 2024-02-25 MED ORDER — ESCITALOPRAM OXALATE 10 MG PO TABS
ORAL_TABLET | ORAL | Status: AC
Start: 2024-02-25 — End: 2024-02-26
  Filled 2024-02-25: qty 1

## 2024-02-25 MED ORDER — ASPIRIN 81 MG PO TBEC
81.0000 mg | DELAYED_RELEASE_TABLET | Freq: Every day | ORAL | Status: DC
  Administered 2024-02-25 – 2024-02-26 (×2): 81 mg via ORAL

## 2024-02-25 MED ORDER — POLYETHYLENE GLYCOL 3350 17 G PO PACK
17.0000 g | PACK | Freq: Every day | ORAL | Status: DC
  Administered 2024-02-25: 17 g via ORAL

## 2024-02-25 MED ORDER — SODIUM CHLORIDE 0.9 % IV SOLN
INTRAVENOUS | Status: AC
  Filled 2024-02-25: qty 10

## 2024-02-25 MED ORDER — GUAIFENESIN ER 600 MG PO TB12
600.0000 mg | ORAL_TABLET | Freq: Two times a day (BID) | ORAL | Status: DC
  Administered 2024-02-25 – 2024-02-26 (×3): 600 mg via ORAL
  Filled 2024-02-25 (×4): qty 1

## 2024-02-25 MED ORDER — ATORVASTATIN CALCIUM 40 MG PO TABS
ORAL_TABLET | ORAL | Status: AC
  Filled 2024-02-25: qty 1

## 2024-02-25 MED ORDER — SODIUM CHLORIDE 0.9 % IV SOLN
1.0000 g | INTRAVENOUS | Status: DC
  Administered 2024-02-25: 1 g via INTRAVENOUS

## 2024-02-25 MED ORDER — ASPIRIN 81 MG PO TBEC
DELAYED_RELEASE_TABLET | ORAL | Status: AC
  Filled 2024-02-25: qty 1

## 2024-02-25 MED ORDER — ATORVASTATIN CALCIUM 40 MG PO TABS
40.0000 mg | ORAL_TABLET | Freq: Every day | ORAL | Status: DC
  Administered 2024-02-25 – 2024-02-26 (×2): 40 mg via ORAL

## 2024-02-25 MED ORDER — POLYETHYLENE GLYCOL 3350 17 G PO PACK
PACK | ORAL | Status: AC
  Filled 2024-02-25: qty 1

## 2024-02-25 MED ORDER — ESCITALOPRAM OXALATE 10 MG PO TABS
5.0000 mg | ORAL_TABLET | Freq: Every day | ORAL | Status: DC
  Administered 2024-02-25 – 2024-02-26 (×2): 5 mg via ORAL

## 2024-02-25 MED ORDER — ENOXAPARIN SODIUM 40 MG/0.4ML IJ SOSY
40.0000 mg | PREFILLED_SYRINGE | INTRAMUSCULAR | Status: DC
  Administered 2024-02-26: 40 mg via SUBCUTANEOUS

## 2024-02-25 NOTE — Progress Notes (Signed)
 Physical Therapy Evaluation Patient Details Name: Kiara Blair MRN: [id number] DOB: 1930/07/01 Today's Date: 02/25/2024  History of Present Illness  Pt is a 89 y/o F presenting to ED on 5/18 from [facility], with AMS x1 week, falls, L chest wall and abdominal pain. Found to have L 3rd rib fx. PMH includes dementia, HLD, HTN, DM2, tremors, TIA  Clinical Impression  Kiara Blair is 89 y.o. female admitted with above HPI and diagnosis. Patient is currently limited by functional impairments below (see PT problem list). Patient lives at ALF and is mod ind with RW at baseline for mobility around facility. Pt's son reports multiple falls in last 6 months and that pt is likely in need of increased assist on daily basis. Pt completed bed mobility at CGA level and transfer and gait with CGA, intermittent min assist to steady balance due to Lt drift during gait. Pt stabilized self with RW, cues for proximity intermittently. Patient will benefit from continued skilled PT interventions to address impairments and progress independence with mobility, recommending return to ALF with [facility] services. Acute PT will follow and progress as able.      Orthostatic VS for the past 24 hrs:  BP- Lying Pulse- Lying BP- Sitting Pulse- Sitting BP- Standing at 0 minutes Pulse- Standing at 0 minutes BP- Standing at 3 minutes Pulse- Standing at 3 minutes  02/25/24 0956 132/51 75 158/73 82 150/61 88 (!) 170/97 83        If plan is discharge home, recommend the following: A little help with walking and/or transfers;A little help with bathing/dressing/bathroom;Assistance with cooking/housework;Assist for transportation;Help with stairs or ramp for entrance;Direct supervision/assist for medications management   Can travel by private vehicle        Equipment Recommendations None recommended by PT  Recommendations for Other Services       Functional Status Assessment Patient has had a recent decline in their functional  status and demonstrates the ability to make significant improvements in function in a reasonable and predictable amount of time.     Precautions / Restrictions Precautions Precautions: Fall Restrictions Weight Bearing Restrictions Per Provider Order: No      Mobility  Bed Mobility Overal bed mobility: Needs Assistance Bed Mobility: Supine to Sit, Sit to Supine     Supine to sit: Contact guard, HOB elevated, Used rails Sit to supine: Contact guard assist   General bed mobility comments: incr time    Transfers Overall transfer level: Needs assistance Equipment used: Rolling walker (2 wheels), 1 person hand held assist Transfers: Sit to/from Stand Sit to Stand: Contact guard assist           General transfer comment: CGA for power up from EOB, pt reliant on Bil UE to initaite rise.    Ambulation/Gait Ambulation/Gait assistance: Contact guard assist, Min assist Gait Distance (Feet): 225 Feet Assistive device: Rolling walker (2 wheels) Gait Pattern/deviations: Step-through pattern, Decreased stride length, Drifts right/left, Trunk flexed Gait velocity: decr     General Gait Details: pt tending to stand/drift to Lt side of RW, cues intermittently for proximity. CGA with 3 occasions of light min assist to steady.  Stairs            Wheelchair Mobility     Tilt Bed    Modified Rankin (Stroke Patients Only)       Balance Overall balance assessment: Needs assistance Sitting-balance support: Feet supported Sitting balance-Leahy Scale: Good Sitting balance - Comments: reaches outside BOS without LOB   Standing  balance support: During functional activity Standing balance-Leahy Scale: Fair Standing balance comment: stands statically without AD                             Pertinent Vitals/Pain Pain Assessment Pain Assessment: No/denies pain    Home Living Family/patient expects to be discharged to:: Skilled nursing facility   Available Help  at Discharge: Available 24 hours/day;Available PRN/intermittently Type of Home: Apartment Home Access: Level entry       Home Layout: One level Home Equipment: Shower seat - built in;Grab bars - tub/shower;Rolling Walker (2 wheels);Cane - single point;Wheelchair - manual;Grab bars - toilet (seat as a shelf) Additional Comments: ALF [facility] (since spring of 2024)    Prior Function Prior Level of Function : Needs assist             Mobility Comments: does not always use AD, but at times uses RW/furniture walks, sometimes amb to dining hall for meals. ADLs Comments: ind with ADLs, facility does meals, showers seated     Extremity/Trunk Assessment   Upper Extremity Assessment Upper Extremity Assessment: Generalized weakness;Defer to OT evaluation    Lower Extremity Assessment Lower Extremity Assessment: Generalized weakness    Cervical / Trunk Assessment Cervical / Trunk Assessment: Kyphotic  Communication   Communication Communication: No apparent difficulties    Cognition Arousal: Alert Behavior During Therapy: WFL for tasks assessed/performed   PT - Cognitive impairments: History of cognitive impairments                         Following commands: Intact       Cueing Cueing Techniques: Verbal cues     General Comments      Exercises     Assessment/Plan    PT Assessment Patient needs continued PT services  PT Problem List Decreased strength;Decreased range of motion;Decreased activity tolerance;Decreased balance;Decreased mobility;Decreased coordination;Decreased cognition;Decreased knowledge of use of DME;Decreased safety awareness;Decreased knowledge of precautions       PT Treatment Interventions DME instruction;Gait training;Stair training;Functional mobility training;Therapeutic activities;Therapeutic exercise;Balance training;Neuromuscular re-education;Cognitive remediation;Patient/family education;Wheelchair mobility training    PT  Goals (Current goals can be found in the Care Plan section)  Acute Rehab PT Goals Patient Stated Goal: reduce falling PT Goal Formulation: With patient/family Time For Goal Achievement: 03/10/24 Potential to Achieve Goals: Good    Frequency Min 2X/week     Co-evaluation               AM-PAC PT "6 Clicks" Mobility  Outcome Measure Help needed turning from your back to your side while in a flat bed without using bedrails?: A Little Help needed moving from lying on your back to sitting on the side of a flat bed without using bedrails?: A Little Help needed moving to and from a bed to a chair (including a wheelchair)?: A Little Help needed standing up from a chair using your arms (e.g., wheelchair or bedside chair)?: A Little Help needed to walk in hospital room?: A Little Help needed climbing 3-5 steps with a railing? : A Lot 6 Click Score: 17    End of Session Equipment Utilized During Treatment: Gait belt Activity Tolerance: Patient tolerated treatment well Patient left: in bed;with call bell/phone within reach;with bed alarm set;with family/visitor present Nurse Communication: Mobility status PT Visit Diagnosis: Other abnormalities of gait and mobility (R26.89);Muscle weakness (generalized) (M62.81);Difficulty in walking, not elsewhere classified (R26.2)    Time:  1610-9604 PT Time Calculation (min) (ACUTE ONLY): 31 min   Charges:   PT Evaluation $PT Eval Moderate Complexity: 1 Mod PT Treatments $Gait Training: 8-22 mins PT General Charges $$ ACUTE PT VISIT: 1 Visit         Tish Forge, DPT Acute Rehabilitation Services Office [phone]  02/25/24 12:56 PM

## 2024-02-25 NOTE — Assessment & Plan Note (Addendum)
 Acute from fall.  Left third rib, nondisplaced. The age-indeterminate fracture on chest x-ray. Start Tylenol  1 g every 6 hours while awake Start lidocaine  patch Start incentive spirometry

## 2024-02-25 NOTE — Assessment & Plan Note (Addendum)
 Denies this morning.  Benign abdominal exam. Follow up FOBT  Continue bowel regimen: Miralax  daily Consider CT angiogram to rule out bleeding Recommend outpatient Pancreatic MRI for IPMN

## 2024-02-25 NOTE — Evaluation (Signed)
 Occupational Therapy Evaluation Patient Details Name: Kiara Blair MRN: [id number] DOB: 29-Apr-1930 Today's Date: 02/25/2024   History of Present Illness   Pt is a 89 y/o F presenting to ED on 5/18 from [facility], with AMS x1 week, falls, L chest wall and abdominal pain. Found to have L 3rd rib fx. PMH includes dementia, HLD, HTN, DM2, tremors, TIA     Clinical Impressions Pt reports ind at baseline with ADLs, occasionally uses RW for mobility or furniture walks, but has had multiple falls. Pt from ALF, where she has assist for IADLs. Pt currently needing set up- min A for ADLs, CGA for bed mobility and CGA-min A for transfers, more steady with RW. Pt presenting with impairments listed below, will follow acutely. Recommend HHOT at ALF at d/c.      If plan is discharge home, recommend the following:   A little help with walking and/or transfers;A little help with bathing/dressing/bathroom;Assistance with cooking/housework;Direct supervision/assist for medications management;Assist for transportation;Direct supervision/assist for financial management;Help with stairs or ramp for entrance;Supervision due to cognitive status     Functional Status Assessment   Patient has had a recent decline in their functional status and demonstrates the ability to make significant improvements in function in a reasonable and predictable amount of time.     Equipment Recommendations   None recommended by OT     Recommendations for Other Services   PT consult     Precautions/Restrictions   Precautions Precautions: Fall Restrictions Weight Bearing Restrictions Per Provider Order: No     Mobility Bed Mobility Overal bed mobility: Needs Assistance Bed Mobility: Supine to Sit     Supine to sit: Contact guard     General bed mobility comments: incr time    Transfers Overall transfer level: Needs assistance Equipment used: Rolling walker (2 wheels), 1 person hand held  assist Transfers: Sit to/from Stand Sit to Stand: Contact guard assist, Min assist           General transfer comment: min A for HHA, CGA with RW      Balance Overall balance assessment: Needs assistance Sitting-balance support: Feet supported Sitting balance-Leahy Scale: Good Sitting balance - Comments: reaches outside BOS without LOB   Standing balance support: During functional activity Standing balance-Leahy Scale: Fair Standing balance comment: stands statically without AD                           ADL either performed or assessed with clinical judgement   ADL Overall ADL's : Needs assistance/impaired Eating/Feeding: Set up;Sitting   Grooming: Set up;Sitting   Upper Body Bathing: Minimal assistance;Sitting   Lower Body Bathing: Minimal assistance;Sit to/from stand   Upper Body Dressing : Contact guard assist   Lower Body Dressing: Contact guard assist Lower Body Dressing Details (indicate cue type and reason): dons socks and mesh underwear Toilet Transfer: Contact guard assist;Ambulation;Rolling walker (2 wheels);Regular Toilet   Toileting- Clothing Manipulation and Hygiene: Supervision/safety       Functional mobility during ADLs: Contact guard assist;Rolling walker (2 wheels)       Vision   Vision Assessment?: No apparent visual deficits     Perception Perception: Not tested       Praxis Praxis: Not tested       Pertinent Vitals/Pain Pain Assessment Pain Assessment: No/denies pain     Extremity/Trunk Assessment Upper Extremity Assessment Upper Extremity Assessment: Generalized weakness   Lower Extremity Assessment Lower Extremity Assessment: Defer to  PT evaluation   Cervical / Trunk Assessment Cervical / Trunk Assessment: Kyphotic   Communication Communication Communication: No apparent difficulties   Cognition Arousal: Alert Behavior During Therapy: WFL for tasks assessed/performed Cognition: History of cognitive  impairments             OT - Cognition Comments: hx dementia, oriented to self, but unable to recall why she came to the hospital, unable to recall her son in the room                 Following commands: Intact       Cueing  General Comments   Cueing Techniques: Verbal cues  VSS on RA   Exercises     Shoulder Instructions      Home Living Family/patient expects to be discharged to:: Skilled nursing facility                                 Additional Comments: ALF      Prior Functioning/Environment               Mobility Comments: does not use AD, but at times uses RW/furniture walks, sometimes amb to dining hall for meals ADLs Comments: ind with ADLs, facility does meals, showers seated    OT Problem List: Decreased strength;Decreased range of motion;Decreased activity tolerance;Impaired balance (sitting and/or standing)   OT Treatment/Interventions: Self-care/ADL training;Therapeutic exercise;Energy conservation;DME and/or AE instruction;Therapeutic activities;Patient/family education;Balance training      OT Goals(Current goals can be found in the care plan section)   Acute Rehab OT Goals Patient Stated Goal: none stated OT Goal Formulation: With patient Time For Goal Achievement: 03/10/24 Potential to Achieve Goals: Good ADL Goals Pt Will Perform Upper Body Dressing: with modified independence;sitting Pt Will Perform Lower Body Dressing: with modified independence;sitting/lateral leans;sit to/from stand Pt Will Transfer to Toilet: with modified independence;ambulating;regular height toilet Pt Will Perform Tub/Shower Transfer: ambulating;shower seat;Shower transfer;rolling walker Additional ADL Goal #1: pt will gather items and perform standing ADL with mod I in order to improve standing balance for ADLs   OT Frequency:  Min 2X/week    Co-evaluation              AM-PAC OT "6 Clicks" Daily Activity     Outcome Measure  Help from another person eating meals?: A Little Help from another person taking care of personal grooming?: A Little Help from another person toileting, which includes using toliet, bedpan, or urinal?: A Little Help from another person bathing (including washing, rinsing, drying)?: A Little Help from another person to put on and taking off regular upper body clothing?: A Little Help from another person to put on and taking off regular lower body clothing?: A Little 6 Click Score: 18   End of Session Equipment Utilized During Treatment: Gait belt;Rolling walker (2 wheels) Nurse Communication: Mobility status  Activity Tolerance: Patient tolerated treatment well Patient left: in bed;with call bell/phone within reach;with bed alarm set;with family/visitor present  OT Visit Diagnosis: Unsteadiness on feet (R26.81);Other abnormalities of gait and mobility (R26.89);Muscle weakness (generalized) (M62.81)                Time: 1610-9604 OT Time Calculation (min): 24 min Charges:  OT General Charges $OT Visit: 1 Visit OT Evaluation $OT Eval Low Complexity: 1 Low OT Treatments $Self Care/Home Management : 8-22 mins  Kivon Aprea K, OTD, OTR/L SecureChat Preferred Acute Rehab [phone]  Quiara Killian K Koonce 02/25/2024, 8:48 AM

## 2024-02-25 NOTE — NC FL2 (Signed)
 [place]  MEDICAID FL2 LEVEL OF CARE FORM     IDENTIFICATION  Patient Name: Kiara Blair Birthdate: 28-Apr-1930 Sex: female Admission Date (Current Location): 02/24/2024  [facility] and [place] Number:  Producer, television/film/video and Address:  [facility], [address], [place], [place] [address]      Provider Number: [id number]  Attending Physician Name and Address:  Arn Lane, MD  Relative Name and Phone Number:  Toutant,[name] Dollie [place])  [phone]    Current Level of Care: Hospital Recommended Level of Care: Assisted Living Facility Prior Approval Number:    Date Approved/Denied:   PASRR Number:    Discharge Plan: Domiciliary (Rest home) (ALF)    Current Diagnoses: Patient Active Problem List   Diagnosis Date Noted   Altered mental status 02/24/2024   Lower urinary tract infectious disease 02/24/2024   Fall 02/24/2024   Rib fracture 02/24/2024   Chronic health problem 02/24/2024   Abdominal pain 02/24/2024   Acute metabolic encephalopathy 11/29/2022   Stroke-like symptoms 11/27/2022   Generalized anxiety disorder 03/22/2022   History of CVA (cerebrovascular accident) 01/04/2021   Essential hypertension 01/03/2021   T2DM (type 2 diabetes mellitus) (HCC) 01/03/2021   Paresthesia 05/01/2020   Nuclear sclerotic cataract of left eye 03/29/2018   Sensorineural hearing loss (SNHL) of both ears 03/13/2017   Gastroesophageal reflux disease 06/26/2016   Diabetic polyneuropathy associated with type 2 diabetes mellitus (HCC) 01/10/2016   Essential tremor 10/22/2015   Hyperlipidemia LDL goal <100 10/22/2015    Orientation RESPIRATION BLADDER Height & Weight     Self, Place  Normal Incontinent, Indwelling catheter Weight: 129 lb 10.1 oz (58.8 kg) Height:  5\' 3"  (160 cm)  BEHAVIORAL SYMPTOMS/MOOD NEUROLOGICAL BOWEL NUTRITION STATUS      Continent Diet  AMBULATORY STATUS COMMUNICATION OF NEEDS Skin   Limited Assist Verbally Normal                        Personal Care Assistance Level of Assistance  Bathing, Feeding, Dressing Bathing Assistance: Limited assistance Feeding assistance: Limited assistance Dressing Assistance: Limited assistance     Functional Limitations Info  Sight, Hearing, Speech Sight Info: Impaired Hearing Info: Adequate Speech Info: Adequate    SPECIAL CARE FACTORS FREQUENCY  PT (By licensed PT), OT (By licensed OT)     PT Frequency: eval and treat OT Frequency: eval and treat            Contractures Contractures Info: Not present    Additional Factors Info  Code Status, Allergies Code Status Info: DNR Allergies Info: Doxycycline Hyclate  Levofloxacin  Meloxicam  Naproxen-esomeprazole Mg  Nitrofurantoin  Other  Pneumococcal Polysaccharide Vaccine           Current Medications (02/25/2024):  This is the current hospital active medication list Current Facility-Administered Medications  Medication Dose Route Frequency Provider Last Rate Last Admin   acetaminophen  (TYLENOL ) tablet 1,000 mg  1,000 mg Oral Q6H McCarty, Artie, MD   1,000 mg at 02/25/24 1211   cefTRIAXone  (ROCEPHIN ) 1 g in sodium chloride  0.9 % 100 mL IVPB  1 g Intravenous Q24H Albin Huh, MD       enoxaparin  (LOVENOX ) injection 30 mg  30 mg Subcutaneous Q24H McCarty, Artie, MD   30 mg at 02/25/24 6433   guaiFENesin  (MUCINEX ) 12 hr tablet 600 mg  600 mg Oral BID Goble Last, MD   600 mg at 02/25/24 0541   insulin  aspart (novoLOG ) injection 0-9 Units  0-9 Units Subcutaneous TID WC McCarty, Artie, MD   5 Units at 02/25/24 1212   lidocaine  (LIDODERM ) 5 % 1 patch  1 patch Transdermal QHS McCarty, Artie, MD   1 patch at 02/24/24 2340   melatonin tablet 3 mg  3 mg Oral QHS McCarty, Artie, MD   3 mg at 02/24/24 2341   polyethylene glycol (MIRALAX  / GLYCOLAX ) packet 17 g  17 g Oral Daily Omar Bibber, DO   17 g at 02/25/24 1320     Discharge Medications: Please see discharge summary for a list of discharge  medications.  Relevant Imaging Results:  Relevant Lab Results:   Additional Information [id number]  Shterna Laramee A [place], LCSW

## 2024-02-25 NOTE — TOC Initial Note (Signed)
 Transition of Care [facility]) - Initial/Assessment Note    Patient Details  Name: Kiara Blair MRN: [id number] Date of Birth: [birth year]  Transition of [facility]) CM/SW Contact:    Sheanna Dail A [place], LCSW Phone Number: 02/25/2024, 2:53 PM  Clinical Narrative:                  CSW met with pt and pt's son [name] at bedside. Pt is from [facility]. They confirmed pt is from facility and plan for return. CSW informed them that pt has recommendation for home health at DC and would assist in facilitating set up at facility. Requested ambulance transport at facility.   CSW reached out to [facility], spoke with pt's nurse tech, [place], informed her of pt's need for Home Health at DC. Requested Home Health order and facility would manage the rest. Fax# [phone]. FL2 completed.   Estimated DC 1-2 days.    TOC will continue to follow.  Expected Discharge Plan: Assisted Living Barriers to Discharge: Continued Medical Work up   Patient Goals and CMS Choice            Expected Discharge Plan and Services                                              Prior Living Arrangements/Services              Need for Family Participation in Patient Care: Yes (Comment) Care giver support system in place?: Yes (comment) (pt's son [name])      Activities of Daily Living   ADL Screening (condition at time of admission) Independently performs ADLs?: No Does the patient have a NEW difficulty with bathing/dressing/toileting/self-feeding that is expected to last >3 days?: No Does the patient have a NEW difficulty with getting in/out of bed, walking, or climbing stairs that is expected to last >3 days?: No Does the patient have a NEW difficulty with communication that is expected to last >3 days?: No Is the patient deaf or have difficulty hearing?: Yes (RIGHT EAR) Does the patient have difficulty seeing, even when wearing glasses/contacts?: No Does the patient have difficulty  concentrating, remembering, or making decisions?: Yes  Permission Sought/Granted                  Emotional Assessment Appearance:: Appears stated age Attitude/Demeanor/Rapport: Lethargic Affect (typically observed): Calm Orientation: : Oriented to Self, Oriented to Place Alcohol / Substance Use: Not Applicable Psych Involvement: No (comment)  Admission diagnosis:  Lower urinary tract infectious disease [N39.0] Altered mental status [R41.82] Fall, initial encounter [W19.XXXA] Closed fracture of one rib of left side, initial encounter [S22.32XA] Altered mental status, unspecified altered mental status type [R41.82] Patient Active Problem List   Diagnosis Date Noted   Altered mental status 02/24/2024   Lower urinary tract infectious disease 02/24/2024   Fall 02/24/2024   Rib fracture 02/24/2024   Chronic health problem 02/24/2024   Abdominal pain 02/24/2024   Acute metabolic encephalopathy 11/29/2022   Stroke-like symptoms 11/27/2022   Generalized anxiety disorder 03/22/2022   History of CVA (cerebrovascular accident) 01/04/2021   Essential hypertension 01/03/2021   T2DM (type 2 diabetes mellitus) (HCC) 01/03/2021   Paresthesia 05/01/2020   Nuclear sclerotic cataract of left eye 03/29/2018   Sensorineural hearing loss (SNHL) of both ears 03/13/2017   Gastroesophageal reflux disease 06/26/2016   Diabetic polyneuropathy associated  with type 2 diabetes mellitus (HCC) 01/10/2016   Essential tremor 10/22/2015   Hyperlipidemia LDL goal <100 10/22/2015   PCP:  Mayer Speaker, PA Pharmacy:   [phone] [place] [place] - [address] [place] [address] Phone: [phone] Fax: [phone]  [facility] [address] [place] [address] Phone: [phone] Fax: [phone]     Social Drivers of Health (SDOH) Social History: SDOH Screenings   Food Insecurity: No Food Insecurity  (02/24/2024)  Housing: Low Risk  (02/25/2024)  Transportation Needs: No Transportation Needs (02/25/2024)  Utilities: Not At Risk (02/25/2024)  Financial Resource Strain: Low Risk  (01/02/2024)   Received from [facility]  Physical Activity: Sufficiently Active (06/15/2020)   Received from [facility]  Social Connections: Socially Isolated (02/25/2024)  Stress: Stress Concern Present (06/15/2020)   Received from [facility]  Tobacco Use: Low Risk  (02/24/2024)   SDOH Interventions:     Readmission Risk Interventions     No data to display

## 2024-02-25 NOTE — Assessment & Plan Note (Deleted)
 In context of recent altered mentation per family.  Tenderness over left chest wall but no other focal tenderness on exam (limited exam of back due to patient mobility).  Negative x-ray left shoulder, left hip. no changes on CT lumbar spine.  Negative CT head. - Fall Precaution

## 2024-02-25 NOTE — Progress Notes (Signed)
 Daily Progress Note Intern Pager: [phone]  Patient name: Kiara Blair Medical record number: [id number] Date of birth: 07-09-1930 Age: 89 y.o. Gender: female  Primary Care Provider: Mayer Speaker, PA Consultants: Palliative care Code Status: DNR  Pt Overview and Major Events to Date:  5/18 - admitted  Assessment and Plan: Kiara Blair is a 89 y.o. female presented with altered mental status x 1 week, frequent falls, left chest wall pain, and abdominal pain; found to have rib fracture.  Pain management, antibiotics for UTI, and pending palliative consult for GOC. Awaiting med rec from facility to restart home meds as appropriate. Assessment & Plan Altered mental status Mentating somewhat better this morning, answering questions appropriately.  Possibly related to UTI.  B12, folate, TSH all WNL.  RPR nonreactive. PT/OT to treat Fall precautions Delirium precautions Treat UTI as below Complicated UTI (urinary tract infection) Recent UTI 2 weeks ago, treated in the outpatient setting with augmentin per pharmacy record without improvement.  UA on admission consistent with UTI.  Patient unable to clarify symptoms.  Hemodynamically stable, no fever, no leukocytosis.  Received ceftriaxone  1 g in ED. Cystitis without ascending UTI on imaging. Ceftriaxone  (5/18- ) Adjust abx regimen as needed pending culture sensitivities Abdominal pain Denies this morning.  Benign abdominal exam. Follow up FOBT  Continue bowel regimen: Miralax  daily Consider CT angiogram to rule out bleeding Recommend outpatient Pancreatic MRI for IPMN  Rib fracture Acute from fall.  Left third rib, nondisplaced. The age-indeterminate fracture on chest x-ray. Start Tylenol  1 g every 6 hours while awake Start lidocaine  patch Start incentive spirometry T2DM (type 2 diabetes mellitus) (HCC) On LAI (pending med rec for # units) and SSI at [facility].  Hold alogliptin-metformin  SSI, CBGs A1c Chronic health  problem Follow up med rec in morning Insomnia: Continue home melatonin   FEN/GI: Carb modified diet PPx: Lovenox  Dispo:Pending PT recommendations  pending clinical improvement . Barriers include continued workup as appropriate.   Subjective:  Patient seen this morning lying in bed with son at bedside.  She is in good spirits and denies pain.  Son does report that she has been having pain in her left side.  They are agreeable to palliative consult for further symptom management and GOC discussion.  Objective: Temp:  [98.6 F (37 C)-99.3 F (37.4 C)] 98.6 F (37 C) (05/18 2333) Pulse Rate:  [68-81] 74 (05/18 2333) Resp:  [16-25] 17 (05/18 2333) BP: (115-179)/(52-93) 179/58 (05/18 2333) SpO2:  [95 %-100 %] 100 % (05/18 2333) Weight:  [58.8 kg] 58.8 kg (05/18 2334) Physical Exam: General: In bed comfortably, NAD Cardiovascular: RRR Respiratory: CTA anteriorly, normal work of breathing on room air. Abdomen: Normoactive bowel sounds, soft, nondistended. Extremities: Moves all equally.  Laboratory: Most recent CBC Lab Results  Component Value Date   WBC 7.6 02/25/2024   HGB 11.3 (L) 02/25/2024   HCT 34.7 (L) 02/25/2024   MCV 80.7 02/25/2024   PLT 241 02/25/2024   Most recent BMP    Latest Ref Rng & Units 02/25/2024    5:36 AM  BMP  Glucose 70 - 99 mg/dL 132   BUN 8 - 23 mg/dL 15   Creatinine 4.40 - 1.00 mg/dL 1.02   Sodium 725 - 366 mmol/L 134   Potassium 3.5 - 5.1 mmol/L 4.1   Chloride 98 - 111 mmol/L 101   CO2 22 - 32 mmol/L 24   Calcium  8.9 - 10.3 mg/dL 8.8    RPR nonreactive HIV nonreactive  Folate, B12, TSH WNL A1c 8.3  Kiara Bibber, DO 02/25/2024, 7:49 AM  PGY-1, [facility] Family Medicine FPTS Intern pager: [phone], text pages welcome Secure chat group [facility] Teaching Service

## 2024-02-25 NOTE — Assessment & Plan Note (Addendum)
 Recent UTI 2 weeks ago, treated in the outpatient setting with augmentin per pharmacy record without improvement.  UA on admission consistent with UTI.  Patient unable to clarify symptoms.  Hemodynamically stable, no fever, no leukocytosis.  Received ceftriaxone  1 g in ED. Cystitis without ascending UTI on imaging. Ceftriaxone  (5/18- ) Adjust abx regimen as needed pending culture sensitivities

## 2024-02-25 NOTE — Plan of Care (Signed)

## 2024-02-25 NOTE — Assessment & Plan Note (Signed)
 On LAI (pending med rec for # units) and SSI at [facility].  Hold alogliptin-metformin  SSI, CBGs A1c

## 2024-02-25 NOTE — Assessment & Plan Note (Addendum)
 Mentating somewhat better this morning, answering questions appropriately.  Possibly related to UTI.  B12, folate, TSH all WNL.  RPR nonreactive. PT/OT to treat Fall precautions Delirium precautions Treat UTI as below

## 2024-02-25 NOTE — Assessment & Plan Note (Addendum)
 Follow up med rec in morning Insomnia: Continue home melatonin

## 2024-02-26 DIAGNOSIS — R4182 Altered mental status, unspecified: Secondary | ICD-10-CM | POA: Diagnosis not present

## 2024-02-26 DIAGNOSIS — S2232XA Fracture of one rib, left side, initial encounter for closed fracture: Secondary | ICD-10-CM | POA: Diagnosis not present

## 2024-02-26 DIAGNOSIS — N390 Urinary tract infection, site not specified: Secondary | ICD-10-CM | POA: Diagnosis not present

## 2024-02-26 LAB — BASIC METABOLIC PANEL WITH GFR
Anion gap: 9 (ref 5–15)
BUN: 11 mg/dL (ref 8–23)
CO2: 24 mmol/L (ref 22–32)
Calcium: 9.1 mg/dL (ref 8.9–10.3)
Chloride: 101 mmol/L (ref 98–111)
Creatinine, Ser: 0.76 mg/dL (ref 0.44–1.00)
GFR, Estimated: >60 mL/min (ref >60)
Glucose, Bld: 298 mg/dL — ABNORMAL HIGH (ref 70–99)
Potassium: 4 mmol/L (ref 3.5–5.1)
Sodium: 134 mmol/L — ABNORMAL LOW (ref 135–145)

## 2024-02-26 LAB — CBC
HCT: 36.8 % (ref 36.0–46.0)
Hemoglobin: 12.1 g/dL (ref 12.0–15.0)
MCH: 26 pg (ref 26.0–34.0)
MCHC: 32.9 g/dL (ref 30.0–36.0)
MCV: 79.1 fL — ABNORMAL LOW (ref 80.0–100.0)
Platelets: 273 10*3/uL (ref 150–400)
RBC: 4.65 MIL/uL (ref 3.87–5.11)
RDW: 15.3 % (ref 11.5–15.5)
WBC: 7.3 10*3/uL (ref 4.0–10.5)
nRBC: 0 % (ref 0.0–0.2)

## 2024-02-26 LAB — OCCULT BLOOD X 1 CARD TO LAB, STOOL: Fecal Occult Bld: NEGATIVE

## 2024-02-26 LAB — URINE CULTURE: Culture: >=100000 — AB

## 2024-02-26 LAB — GLUCOSE, CAPILLARY
Glucose-Capillary: 293 mg/dL — ABNORMAL HIGH (ref 70–99)
Glucose-Capillary: 314 mg/dL — ABNORMAL HIGH (ref 70–99)

## 2024-02-26 MED ORDER — ATORVASTATIN CALCIUM 40 MG PO TABS
ORAL_TABLET | ORAL | Status: AC
  Filled 2024-02-26: qty 1

## 2024-02-26 MED ORDER — POLYETHYLENE GLYCOL 3350 17 G PO PACK
PACK | ORAL | Status: AC
  Filled 2024-02-26: qty 1

## 2024-02-26 MED ORDER — ASPIRIN 81 MG PO TBEC
DELAYED_RELEASE_TABLET | ORAL | Status: AC
  Filled 2024-02-26: qty 1

## 2024-02-26 MED ORDER — SODIUM CHLORIDE 0.9 % IV SOLN
1.0000 g | INTRAVENOUS | Status: AC
  Administered 2024-02-26: 1 g via INTRAVENOUS
  Filled 2024-02-26: qty 10

## 2024-02-26 MED ORDER — ENOXAPARIN SODIUM 40 MG/0.4ML IJ SOSY
PREFILLED_SYRINGE | INTRAMUSCULAR | Status: AC
  Filled 2024-02-26: qty 0.4

## 2024-02-26 MED ORDER — ESCITALOPRAM OXALATE 10 MG PO TABS
ORAL_TABLET | ORAL | Status: AC
  Filled 2024-02-26: qty 1

## 2024-02-26 NOTE — Assessment & Plan Note (Deleted)
 On LAI (pending med rec for # units) and SSI at [facility].  Hold alogliptin-metformin  Continue Lantus 8 units daily at bedtime SSI increase to moderate?, CBGs

## 2024-02-26 NOTE — Plan of Care (Signed)

## 2024-02-26 NOTE — Assessment & Plan Note (Deleted)
 Insomnia: Continue home melatonin Lexapro  5 mg  Aspirin  81 mg daily

## 2024-02-26 NOTE — Assessment & Plan Note (Deleted)
 Denies again this morning. Benign abdominal exam. FOBT negative.  Continue bowel regimen: Miralax  daily Recommend outpatient Pancreatic MRI for IPMN

## 2024-02-26 NOTE — Assessment & Plan Note (Deleted)
 Mentation is stable this morning. Answers question appropriately. Possibly related to UTI. Also consider contribution of recently diagnosed dementia. Will follow up palliative consult to have them talk with family about prognosis of dementia and care resources.   OT to treat Fall precautions Delirium precautions Treat UTI as below F/u Palliative consult

## 2024-02-26 NOTE — TOC Progression Note (Addendum)
 Transition of Care [facility]) - Progression Note    Patient Details  Name: Kiara Blair MRN: [id number] Date of Birth: 06-Oct-1930  [facility] CM/SW Contact  Dane Dung, RN Phone Number: 02/26/2024, 11:26 AM  Clinical Narrative:    CM met with the patient and son, Hewitt Lou at the bedside and patient will likely be able to discharge home today/tomorrow.  [facility] transportation will be arranged once patient is stable.  Medical team was updated to co-sign the home health orders and DNR needed for the patient to return to [facility] when stable.  CM and MSW with [facility] Team will continue to follow the patient to return to the ALF when stable.  HH orders were faxed to the ALF at fax # [phone].   Expected Discharge Plan: Assisted Living Barriers to Discharge: Continued Medical Work up  Expected Discharge Plan and Services                                               Social Determinants of Health (SDOH) Interventions SDOH Screenings   Food Insecurity: No Food Insecurity (02/24/2024)  Housing: Low Risk  (02/25/2024)  Transportation Needs: No Transportation Needs (02/25/2024)  Utilities: Not At Risk (02/25/2024)  Financial Resource Strain: Low Risk  (01/02/2024)   Received from [facility]  Physical Activity: Sufficiently Active (06/15/2020)   Received from [facility], [facility]  Social Connections: Socially Isolated (02/25/2024)  Stress: Stress Concern Present (06/15/2020)   Received from [facility], [facility]  Tobacco Use: Low Risk  (02/24/2024)    Readmission Risk Interventions     No data to display

## 2024-02-26 NOTE — Assessment & Plan Note (Deleted)
 Recent UTI 2 weeks ago, treated in the outpatient setting with augmentin per pharmacy record without improvement.  UA on admission consistent with UTI. E.Coli shown on urine culture, sensitivities pending. Continues to be hemodynamically stable, no fever, no leukocytosis.  Received ceftriaxone  1 g in ED. Cystitis without ascending UTI on imaging.  Ceftriaxone  (5/18- ) Adjust abx regimen as needed pending culture sensitivities

## 2024-02-26 NOTE — Assessment & Plan Note (Deleted)
 In context of recent altered mentation per family. Negative x-ray left shoulder, left hip. no changes on CT lumbar spine.  Negative CT head. No tenderness on exam today  - Fall Precaution

## 2024-02-26 NOTE — Assessment & Plan Note (Deleted)
 Acute from fall.  Left third rib, nondisplaced. The age-indeterminate fracture on chest x-ray. Continue Tylenol  1 g every 6 hours while awake Continue lidocaine  patch Continue incentive spirometry

## 2024-02-26 NOTE — Progress Notes (Signed)
 Mobility Specialist: Progress Note   02/26/24 1544  Mobility  Activity Ambulated with assistance in hallway  Level of Assistance Contact guard assist, steadying assist  Assistive Device Front wheel walker  Distance Ambulated (ft) 350 ft  Activity Response Tolerated well  Mobility Referral Yes  Mobility visit 1 Mobility  Mobility Specialist Start Time (ACUTE ONLY) 1500  Mobility Specialist Stop Time (ACUTE ONLY) 1508  Mobility Specialist Time Calculation (min) (ACUTE ONLY) 8 min    Pt was agreeable to mobility session - received in bed. CG throughout. C/o feeling weak. Uncontrolled steering of RW throughout and was drifting from L to R. Returned to room without fault. Left in bed with all needs met, call bell in reach. Family in room.   Deloria Fetch Mobility Specialist Please contact via SecureChat or Rehab office at [phone]

## 2024-02-26 NOTE — Inpatient Diabetes Management (Signed)
 Inpatient Diabetes Program Recommendations  AACE/ADA: New Consensus Statement on Inpatient Glycemic Control (2015)  Target Ranges:  Prepandial:   less than 140 mg/dL      Peak postprandial:   less than 180 mg/dL (1-2 hours)      Critically ill patients:  140 - 180 mg/dL   Lab Results  Component Value Date   GLUCAP 293 (H) 02/26/2024   HGBA1C 8.3 (H) 02/25/2024    Latest Reference Range & Units 02/25/24 09:42 02/25/24 11:32 02/25/24 16:36 02/25/24 20:57 02/26/24 09:50  Glucose-Capillary 70 - 99 mg/dL 295 (H) 621 (H) 89 308 (H) 293 (H)  (H): Data is abnormally high  Diabetes history: DM2 Outpatient Diabetes medications: Nov 4 tid, Alogliptin-Met 12.5-1 gm bid  Current orders for Inpatient glycemic control: Novolog  0-9 units tid  Inpatient Diabetes Program Recommendations:   -Add Semglee 6 units now and daily (0.1 unit/kg x 58.8) -Decrease Novolog  correction to 0-6 units tid  Thank you, Elmus Mathes E. Arieana Somoza, RN, MSN, CDCES  Diabetes Coordinator Inpatient Glycemic Control Team Team Pager [phone] (8am-5pm) 02/26/2024 11:48 AM

## 2024-02-26 NOTE — Discharge Summary (Addendum)
 Family Medicine Teaching [facility] Discharge Summary  Patient name: Kiara Blair Medical record number: [id number] Date of birth: May 15, 1930 Age: 89 y.o. Gender: female Date of Admission: 02/24/2024  Date of Discharge: 02/26/2024 Admitting Physician: Arn Lane, MD  Primary Care Provider: Mayer Speaker, PA Consultants: None  Indication for Hospitalization: AMS and complicated UTI  Brief Hospital Course:  Kiara Blair is a 89 y.o. female with history of dementia, HTN, HLD, T2DM, TIA, tremors who was admitted to the [facility] Teaching Service at [facility] for AMS, falls, found to have rib fracture.  AMS Patient with multiple falls at her assisted living facility.  On presentation to the ED she had "pain all over" and was acutely confused.  CXR with age-indeterminate fracture of the left third rib. CT lumbar spine and CT A/P with cystitis, constipation.  Shoulder, hip, and abdomen x-rays without acute abnormality. Acute decline in mental status thought to be due to metabolic encephalopathy in setting of UTI. May also be contributed to by previously suspected dementia. Will need outpatient evaluation and family counseling on dementia.   Complicated UTI Patient was treated in the outpatient setting for UTI and completed a full course of Augmentin approximately 2 weeks prior to this hospitalization.  On arrival to the ED she was afebrile, without leukocytosis.  She was completed treatment on ceftriaxone  (5/18-5/20). Urine culture showed E,coli.  When culture sensitivities returned antibiotics her treatment was completed on ceftriazxone.  Rib fracture Age-indeterminate fracture of the left third rib on imaging as above.  Likely from falls at living facility.  Patient received pain management.  Abdominal pain Imaging with evidence of constipation as above.  Abdominal pain also potentially related to UTI.  Patient received MiraLAX . Pain resolved with Miralax  and UTI treatment during  hospitalization.   HTN Home blood pressure medications were held during hospitalization. Restarted home propranolol  at time of discharge and held home amlodipine  in setting of variable diastolic blood pressures.    PCP follow-up recommendations:  1. Obtain outpatient Pancreatic MRI to characterize pancreatic IPMN 2. Recommend outpatient follow up with Palliative Medicine to continue discussion of GOC, dementia progression, etc. 3. Repeat BP and evaluate restarting amlodipine  if diastolic BP will tolerate.      Disposition: To home with [facility] PT/OT   Discharge Condition: Stable and appropriate for return to ALF  Discharge Exam:  Subjective:  Patient seen sleeping comfortably in bed. Awoke to voice. Patient asked why her children had not come to visit her and then stated that she did not remember when informed that 2 of her sons visited yesterday. She denies any pain, nausea, vomiting, or constipation. She is oriented only to self.     Objective: Temp:  [97.3 F (36.3 C)-98.6 F (37 C)] 98.1 F (36.7 C) (05/20 0739) Pulse Rate:  [66-86] 79 (05/20 0739) Resp:  [17-19] 18 (05/20 0739) BP: (145-184)/(56-80) 158/80 (05/20 0739) SpO2:  [92 %-100 %] 98 % (05/20 0739) Physical Exam: General: In bed comfortably, NAD, oriented to self only Cardiovascular: RRR Respiratory: CTA anteriorly, normal work of breathing on room air. Abdomen: Normoactive bowel sounds, soft, nondistended. Extremities: Moves all extremities equally.  Significant Procedures: None  Significant Labs and Imaging:  Recent Labs  Lab 02/24/24 1700 02/25/24 0536 02/26/24 0853  WBC 8.8 7.6 7.3  HGB 11.4* 11.3* 12.1  HCT 35.8* 34.7* 36.8  PLT 221 241 273   Recent Labs  Lab 02/24/24 1700 02/25/24 0536 02/26/24 0853  NA 135 134* 134*  K 3.8  4.1 4.0  CL 101 101 101  CO2 23 24 24   GLUCOSE 203* 241* 298*  BUN 19 15 11   CREATININE 1.00 0.82 0.76  CALCIUM  9.0 8.8* 9.1  ALKPHOS 98  --   --   AST 29  --   --    ALT 20  --   --   ALBUMIN 3.0*  --   --     Latest Reference Range & Units 02/24/24 16:06  URINALYSIS, W/ REFLEX TO CULTURE (INFECTION SUSPECTED)  Rpt !  Appearance CLEAR  HAZY !  Bilirubin Urine NEGATIVE  NEGATIVE  Color, Urine YELLOW  YELLOW  Glucose, UA NEGATIVE mg/dL 50 !  Hgb urine dipstick NEGATIVE  NEGATIVE  Ketones, ur NEGATIVE mg/dL 5 !  Leukocytes,Ua NEGATIVE  LARGE !  Nitrite NEGATIVE  POSITIVE !  pH 5.0 - 8.0  5.0  Protein NEGATIVE mg/dL 30 !  Specific Gravity, Urine 1.005 - 1.030  1.015  Specimen Source  URINE, CLEAN CATCH  Bacteria, UA NONE SEEN  MANY !  RBC / HPF 0 - 5 RBC/hpf 0-5  Squamous Epithelial / HPF 0 - 5 /HPF 0-5  WBC, UA 0 - 5 WBC/hpf 21-50  !: Data is abnormal Rpt: View report in Results Review for more information  Urine Culture 02/24/2024  Escherichia coli      MIC    AMPICILLIN <=2 SENSITIVE Sensitive    AMPICILLIN/SULBACTAM <=2 SENSITIVE Sensitive    CEFAZOLIN <=4 SENSITIVE Sensitive    CEFEPIME <=0.12 SENS... Sensitive    CEFTRIAXONE  <=0.25 SENS... Sensitive    CIPROFLOXACIN <=0.25 SENS... Sensitive    GENTAMICIN <=1 SENSITIVE Sensitive    IMIPENEM <=0.25 SENS... Sensitive    NITROFURANTOIN <=16 SENSIT... Sensitive    PIP/TAZO <=4 SENSITI... Sensitive    TRIMETH/SULFA >=320 RESIS... Resistant    Imaging CT Head 5/18 IMPRESSION: 1. No acute intracranial abnormality. 2. Moderate chronic small vessel ischemic change. 3. Chronic paranasal sinus mucosal thickening, although worsened from prior exam. Minimal fluid level in the left maxillary sinus.  DG Hip Left 5/18 IMPRESSION: Mild degenerative joint disease of left hip. No acute abnormality seen.  DG Shoulder Left 5/18 IMPRESSION: Severe osteoarthritis of left glenohumeral joint. Narrowing of subacromial space is noted suggesting chronic rotator cuff injury. No acute abnormality seen.  CXR 5/18 IMPRESSION: Age-indeterminate fracture of the LEFT lateral third rib.  Recommend correlation with point tenderness.  CT Abdomen 5/18 IMPRESSION: 1. Cystitis without CT evidence for ascending UTI. 2. Constipation and diverticulosis. 3. New trace left pleural effusion. 4. Increased moderate-sized hiatal hernia. 5. Aortic atherosclerosis. 6. 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 Pancreas dedicated MRI is recommended for further characterization if clinically warranted taking into consideration advanced age. 7. Trace pelvic ascites. 8. Small supraumbilical fat hernia. 9. Osteopenia, scoliosis and degenerative change without evidence of lumbar fractures. 10. Multilevel spinal canal, subarticular recess and foraminal stenosis due to spondylosis, facet DJD and dextroscoliosis. Similar findings 2 years ago.  Results/Tests Pending at Time of Discharge: None  Discharge Medications:  Allergies as of 02/26/2024       Reactions   Doxycycline Hyclate Nausea And Vomiting   Levofloxacin Nausea And Vomiting   Meloxicam Nausea And Vomiting   Naproxen-esomeprazole Mg Nausea And Vomiting   Nitrofurantoin Nausea And Vomiting   Other    Pt states she is allergic to meds but does not know names   Pneumococcal Polysaccharide Vaccine Itching  Medication List     PAUSE taking these medications    amLODipine -benazepril  5-20 MG capsule Wait to take this until your doctor or other care provider tells you to start again. Commonly known as: LOTREL Take 1 capsule by mouth at bedtime.       STOP taking these medications    Alogliptin Benzoate 25 MG Tabs   hydrOXYzine  10 MG tablet Commonly known as: ATARAX    Kazano 12.02-999 MG Tabs Generic drug: Alogliptin-metFORMIN  HCl   melatonin 3 MG Tabs tablet   omeprazole  20 MG capsule Commonly known as: PRILOSEC   traMADol  50 MG tablet Commonly known as: ULTRAM        TAKE these medications    acetaminophen  325 MG  tablet Commonly known as: Tylenol  Take 2 tablets (650 mg total) by mouth every 6 (six) hours as needed. What changed: reasons to take this   aspirin  EC 81 MG tablet Take 1 tablet (81 mg total) by mouth daily. Swallow whole.   atorvastatin  40 MG tablet Commonly known as: LIPITOR Take 1 tablet (40 mg total) by mouth daily.   escitalopram  5 MG tablet Commonly known as: LEXAPRO  Take 5 mg by mouth daily.   Lantus SoloStar 100 UNIT/ML Solostar Pen Generic drug: insulin  glargine Inject 8 Units into the skin at bedtime.   lidocaine  5 % ointment Commonly known as: XYLOCAINE  Apply 1 Application topically 4 (four) times daily as needed for moderate pain. What changed: Another medication with the same name was removed. Continue taking this medication, and follow the directions you see here.   multivitamin with minerals Tabs tablet Take 1 tablet by mouth in the morning and at bedtime. Dr. Whitakers Vitamins.   NovoLOG  FlexPen 100 UNIT/ML FlexPen Generic drug: insulin  aspart Inject 5 Units into the skin in the morning and at bedtime.   pregabalin  100 MG capsule Commonly known as: Lyrica  Take 1 capsule (100 mg total) by mouth 2 (two) times daily.   propranolol  10 MG tablet Commonly known as: INDERAL  Take 10 mg by mouth 2 (two) times daily.   Vitamin D3 50 MCG (2000 UT) Tabs Take 1 tablet by mouth daily.        Discharge Instructions: Please refer to Patient Instructions section of EMR for full details.  Patient was counseled important signs and symptoms that should prompt return to medical care, changes in medications, dietary instructions, activity restrictions, and follow up appointments.   Follow-Up Appointments: 1. Follow up with PCP as able to schedule  2. Palliative medicine follow up as able to schedule with family  Kiara Blair, Medical Student 02/26/2024, 11:03 AM  I was personally present and performed or re-performed the history, physical exam and medical decision  making activities of this service and have verified that the service and findings are accurately documented in the student's note.  Albin Huh, MD                  02/26/2024, 12:56 PM

## 2024-04-07 ENCOUNTER — Emergency Department (HOSPITAL_BASED_OUTPATIENT_CLINIC_OR_DEPARTMENT_OTHER)

## 2024-04-07 ENCOUNTER — Other Ambulatory Visit: Payer: Self-pay

## 2024-04-07 ENCOUNTER — Emergency Department (HOSPITAL_BASED_OUTPATIENT_CLINIC_OR_DEPARTMENT_OTHER)
Admission: EM | Admit: 2024-04-07 | Discharge: 2024-04-07 | Disposition: A | Discharge status: Home / Self Care | Attending: Emergency Medicine | Admitting: Emergency Medicine

## 2024-04-07 DIAGNOSIS — W19XXXA Unspecified fall, initial encounter: Secondary | ICD-10-CM

## 2024-04-07 DIAGNOSIS — S0083XA Contusion of other part of head, initial encounter: Secondary | ICD-10-CM | POA: Diagnosis not present

## 2024-04-07 DIAGNOSIS — W01198A Fall on same level from slipping, tripping and stumbling with subsequent striking against other object, initial encounter: Secondary | ICD-10-CM | POA: Insufficient documentation

## 2024-04-07 DIAGNOSIS — Z7982 Long term (current) use of aspirin: Secondary | ICD-10-CM | POA: Insufficient documentation

## 2024-04-07 DIAGNOSIS — Z79899 Other long term (current) drug therapy: Secondary | ICD-10-CM | POA: Insufficient documentation

## 2024-04-07 DIAGNOSIS — I10 Essential (primary) hypertension: Secondary | ICD-10-CM | POA: Insufficient documentation

## 2024-04-07 DIAGNOSIS — S0990XA Unspecified injury of head, initial encounter: Secondary | ICD-10-CM | POA: Diagnosis present

## 2024-04-07 DIAGNOSIS — Z8673 Personal history of transient ischemic attack (TIA), and cerebral infarction without residual deficits: Secondary | ICD-10-CM | POA: Insufficient documentation

## 2024-04-07 DIAGNOSIS — E119 Type 2 diabetes mellitus without complications: Secondary | ICD-10-CM | POA: Insufficient documentation

## 2024-04-07 LAB — URINALYSIS, ROUTINE W REFLEX MICROSCOPIC
Bacteria, UA: NONE SEEN
Bilirubin Urine: NEGATIVE
Glucose, UA: >1000 mg/dL — AB
Hgb urine dipstick: NEGATIVE
Ketones, ur: NEGATIVE mg/dL
Leukocytes,Ua: NEGATIVE
Nitrite: NEGATIVE
Protein, ur: NEGATIVE mg/dL
Specific Gravity, Urine: 1.013 (ref 1.005–1.030)
pH: 6 (ref 5.0–8.0)

## 2024-04-07 LAB — BASIC METABOLIC PANEL WITH GFR
Anion gap: 11 (ref 5–15)
BUN: 23 mg/dL (ref 8–23)
CO2: 23 mmol/L (ref 22–32)
Calcium: 10 mg/dL (ref 8.9–10.3)
Chloride: 103 mmol/L (ref 98–111)
Creatinine, Ser: 0.97 mg/dL (ref 0.44–1.00)
GFR, Estimated: 55 mL/min — ABNORMAL LOW (ref >60)
Glucose, Bld: 307 mg/dL — ABNORMAL HIGH (ref 70–99)
Potassium: 4.4 mmol/L (ref 3.5–5.1)
Sodium: 137 mmol/L (ref 135–145)

## 2024-04-07 LAB — CBC
HCT: 38.5 % (ref 36.0–46.0)
Hemoglobin: 12.1 g/dL (ref 12.0–15.0)
MCH: 25.6 pg — ABNORMAL LOW (ref 26.0–34.0)
MCHC: 31.4 g/dL (ref 30.0–36.0)
MCV: 81.6 fL (ref 80.0–100.0)
Platelets: 272 10*3/uL (ref 150–400)
RBC: 4.72 MIL/uL (ref 3.87–5.11)
RDW: 15.8 % — ABNORMAL HIGH (ref 11.5–15.5)
WBC: 12.8 10*3/uL — ABNORMAL HIGH (ref 4.0–10.5)
nRBC: 0 % (ref 0.0–0.2)

## 2024-04-07 MED ORDER — AMLODIPINE BESYLATE 5 MG PO TABS
5.0000 mg | ORAL_TABLET | Freq: Once | ORAL | Status: DC
Start: 2024-04-07 — End: 2024-04-07
  Filled 2024-04-07: qty 1

## 2024-04-07 NOTE — ED Triage Notes (Addendum)
 From [facility]. Unwitnessed fall. Per staff, patient hit left side of face. No obvious markings. Denies LOC. Poor historian. Hx of dementia. A&Ox3 at baseline. No thinners.

## 2024-04-07 NOTE — ED Notes (Signed)
Pt aware of the need for a urine... Pt unable to currently provide the sample.Marland KitchenMarland Kitchen

## 2024-04-07 NOTE — Discharge Instructions (Signed)
 As we discussed, your workup in the ER today was reassuring for acute findings.  Laboratory evaluation and CT imaging did not reveal any emergent concerns.  I recommend that you follow-up with your PCP at your earliest convenience.  Return if development of any new or worsening symptoms.

## 2024-04-07 NOTE — ED Notes (Addendum)
 Family contacted and was able to transport Pt back to her SNF... DC paperwork given and verbally understood by family... [facility] attempted to be contacted for report.[email or web address]

## 2024-04-07 NOTE — ED Notes (Signed)
 Called Kiara Blair at [facility] for transport

## 2024-04-07 NOTE — ED Provider Notes (Signed)
 [place] [facility]MENT AT [facility] Provider Note   CSN: [id number] Arrival date & time: 04/07/24  1217     Patient presents with: Kiara Blair is a 89 y.o. female.   Patient with history of diabetes, hypertension, hyperlipidemia, CVA, dementia presents today from [facility] with complaints of fall. Kiara Blair states that same occurred earlier today. Reports Kiara Blair was trying to squeeze in between her dresser and her bed, and reports the space is quite narrow. States that next thing I knew I was on the ground. States Kiara Blair doesn't think Kiara Blair passed out but isn't sure exactly what happened. Reports Kiara Blair has walked since the fall. Does think Kiara Blair hit her head, Kiara Blair is not anticoagulated. Denies any pain or complaints. Of note, patient has not had her morning meds today.   The history is provided by the patient. No language interpreter was used.  Fall      Prior to Admission medications   Medication Sig Start Date End Date Taking? Authorizing Provider  acetaminophen  (TYLENOL ) 325 MG tablet Take 2 tablets (650 mg total) by mouth every 6 (six) hours as needed. Patient taking differently: Take 650 mg by mouth every 6 (six) hours as needed for mild pain (pain score 1-3) or moderate pain (pain score 4-6). 12/06/21   Elnor Jayson LABOR, DO  amLODipine -benazepril  (LOTREL) 5-20 MG per capsule Take 1 capsule by mouth at bedtime.    [provider]  aspirin  EC 81 MG tablet Take 1 tablet (81 mg total) by mouth daily. Swallow whole. 12/05/22   Lue Elsie BROCKS, MD  atorvastatin  (LIPITOR) 40 MG tablet Take 1 tablet (40 mg total) by mouth daily. 02/08/21   Whitfield Raisin, NP  Cholecalciferol (VITAMIN D3) 50 MCG (2000 UT) TABS Take 1 tablet by mouth daily. 01/01/23   [provider]  escitalopram  (LEXAPRO ) 5 MG tablet Take 5 mg by mouth daily. 01/26/23   [provider]  LANTUS SOLOSTAR 100 UNIT/ML Solostar Pen Inject 8 Units into the skin at bedtime. 02/20/24    [provider]  lidocaine  (XYLOCAINE ) 5 % ointment Apply 1 Application topically 4 (four) times daily as needed for moderate pain. Patient not taking: Reported on 02/25/2024 07/31/22   Long, Joshua G, MD  Multiple Vitamin (MULTIVITAMIN WITH MINERALS) TABS tablet Take 1 tablet by mouth in the morning and at bedtime. Dr. Whitakers Vitamins.    [provider]  NOVOLOG  FLEXPEN 100 UNIT/ML FlexPen Inject 5 Units into the skin in the morning and at bedtime. 01/08/23   [provider]  pregabalin  (LYRICA ) 100 MG capsule Take 1 capsule (100 mg total) by mouth 2 (two) times daily. 05/23/23   Armenta Canning, MD  propranolol  (INDERAL ) 10 MG tablet Take 10 mg by mouth 2 (two) times daily. 01/05/21   [provider]    Allergies: Doxycycline hyclate, Levofloxacin, Meloxicam, Naproxen-esomeprazole mg, Nitrofurantoin, Other, and Pneumococcal polysaccharide vaccine    Review of Systems  All other systems reviewed and are negative.   Updated Vital Signs BP (!) 146/59   Pulse 68   Temp (!) 97.1 F (36.2 C) (Oral)   Resp 20   SpO2 90%   Physical Exam Vitals and nursing note reviewed.  Constitutional:      General: Kiara Blair is not in acute distress.    Appearance: Normal appearance. Kiara Blair is normal weight. Kiara Blair is not ill-appearing, toxic-appearing or diaphoretic.  HENT:     Head: Normocephalic and atraumatic.     Comments: No  racoon eyes No battle sign  Small hematoma noted to the right forehead area. No crepitus or wounds  Eyes:     Extraocular Movements: Extraocular movements intact.     Pupils: Pupils are equal, round, and reactive to light.    Cardiovascular:     Rate and Rhythm: Normal rate.     Comments: No tenderness to palpation of the anterior chest wall Pulmonary:     Effort: Pulmonary effort is normal. No respiratory distress.  Abdominal:     Comments: No abdominal tenderness or bruising   Musculoskeletal:        General: Normal range of motion.      Cervical back: Normal and normal range of motion.     Thoracic back: Normal.     Lumbar back: Normal.     Comments: No midline tenderness, no stepoffs or deformity noted on palpation of cervical, thoracic, and lumbar spine   Skin:    General: Skin is warm and dry.   Neurological:     General: No focal deficit present.     Mental Status: Kiara Blair is alert and oriented to person, place, and time.   Psychiatric:        Mood and Affect: Mood normal.        Behavior: Behavior normal.    (all labs ordered are listed, but only abnormal results are displayed) Labs Reviewed  CBC - Abnormal; Notable for the following components:      Result Value   WBC 12.8 (*)    MCH 25.6 (*)    RDW 15.8 (*)    All other components within normal limits  BASIC METABOLIC PANEL WITH GFR - Abnormal; Notable for the following components:   Glucose, Bld 307 (*)    GFR, Estimated 55 (*)    All other components within normal limits  URINALYSIS, ROUTINE W REFLEX MICROSCOPIC - Abnormal; Notable for the following components:   Glucose, UA >1,000 (*)    All other components within normal limits    EKG: None  Radiology: CT Cervical Spine Wo Contrast Result Date: 04/07/2024 CLINICAL DATA:  Neck trauma.  Unwitnessed fall. EXAM: CT CERVICAL SPINE WITHOUT CONTRAST TECHNIQUE: Multidetector CT imaging of the cervical spine was performed without intravenous contrast. Multiplanar CT image reconstructions were also generated. RADIATION DOSE REDUCTION: This exam was performed according to the departmental dose-optimization program which includes automated exposure control, adjustment of the mA and/or kV according to patient size and/or use of iterative reconstruction technique. COMPARISON:  CT cervical spine 06/10/2023. Report from thyroid ultrasound 02/23/2020 FINDINGS: Alignment: Mildly exaggerated cervical lordosis. No focal angulation or significant listhesis. Skull base and vertebrae: No evidence of acute cervical spine  fracture or traumatic subluxation. Soft tissues and spinal canal: No prevertebral fluid or swelling. No visible canal hematoma. Disc levels: Similar mild multilevel spondylosis with disc space narrowing, endplate osteophytes and uncinate spurring. There is asymmetric facet hypertrophy on the left at C3-4 and C4-5. Resulting multilevel spinal stenosis and foraminal narrowing, similar to previous study. Upper chest: 3.5 x 2.9 cm right thyroid nodule is grossly unchanged. This has been previously evaluated by thyroid ultrasound. Clear lung apices. Other: None. IMPRESSION: 1. No evidence of acute cervical spine fracture, traumatic subluxation or static signs of instability. 2. Similar multilevel cervical spondylosis with resulting multilevel spinal stenosis and foraminal narrowing. 3. Stable right thyroid nodule, previously evaluated by ultrasound. Electronically Signed   By: Elsie Perone M.D.   On: 04/07/2024 14:30   CT Head Wo Contrast Result  Date: 04/07/2024 CLINICAL DATA:  Head trauma, minor (Age >= 65y).  Fall. EXAM: CT HEAD WITHOUT CONTRAST TECHNIQUE: Contiguous axial images were obtained from the base of the skull through the vertex without intravenous contrast. RADIATION DOSE REDUCTION: This exam was performed according to the departmental dose-optimization program which includes automated exposure control, adjustment of the mA and/or kV according to patient size and/or use of iterative reconstruction technique. COMPARISON:  02/24/2024 FINDINGS: Brain: There is atrophy and chronic small vessel disease changes. No acute intracranial abnormality. Specifically, no hemorrhage, hydrocephalus, mass lesion, acute infarction, or significant intracranial injury. Vascular: No hyperdense vessel or unexpected calcification. Skull: No acute calvarial abnormality. Sinuses/Orbits: No acute findings Other: None IMPRESSION: Atrophy, chronic microvascular disease. No acute intracranial abnormality. Electronically Signed    By: Franky Crease M.D.   On: 04/07/2024 14:27     Procedures   Medications Ordered in the ED - No data to display                                  Medical Decision Making Amount and/or Complexity of Data Reviewed Labs: ordered. Radiology: ordered.  Risk Prescription drug management.   This patient is a 89 y.o. female who presents to the ED for concern of fall, this involves an extensive number of treatment options, and is a complaint that carries with it a high risk of complications and morbidity. The emergent differential diagnosis prior to evaluation includes, but is not limited to,  trauma . This is not an exhaustive differential.   Past Medical History / Co-morbidities / Social History:  has a past medical history of Diabetes mellitus without complication (HCC), Hypercholesteremia, Hypertension, TIA (transient ischemic attack) (01/03/2021), and Tremor.  Additional history: Chart reviewed.   Physical Exam: Physical exam performed. The pertinent findings include: Well appearing, no significant signs or trauma or other abnormalities. Does have a very small hematoma to the left forehead.  Lab Tests: I ordered, and personally interpreted labs.  The pertinent results include:  WBC 12.8, likely due to trauma. Glucose 307. UA with glucose, noninfectious   Imaging Studies: I ordered imaging studies including CT head, cervical spine. I independently visualized and interpreted imaging which showed   CT head: Atrophy, chronic microvascular disease. No acute intracranial abnormality.  CT cervical spine: 1. No evidence of acute cervical spine fracture, traumatic subluxation or static signs of instability. 2. Similar multilevel cervical spondylosis with resulting multilevel spinal stenosis and foraminal narrowing. 3. Stable right thyroid nodule, previously evaluated by ultrasound.  I agree with the radiologist interpretation.   Cardiac Monitoring:  The patient was maintained on a  cardiac monitor. Cardiac monitor showed an underlying rhythm of: sinus rhythm, no STEMI. I agree with this interpretation.   Disposition: After consideration of the diagnostic results and the patients response to treatment, I feel that emergency department workup does not suggest an emergent condition requiring admission or immediate intervention beyond what has been performed at this time. The plan is: Discharge with close outpatient follow-up and return precautions.  Patient's workup is benign, Kiara Blair feels well and ready to go back to her facility. Evaluation and diagnostic testing in the emergency department does not suggest an emergent condition requiring admission or immediate intervention beyond what has been performed at this time.  Plan for discharge with close PCP follow-up.  Patient is understanding and amenable with plan, educated on red flag symptoms that would prompt immediate return.  Patient  discharged in stable condition.  Final diagnoses:  Fall, initial encounter    ED Discharge Orders     None     An After Visit Summary was printed and given to the patient.      Nora Lauraine LABOR, PA-C 04/07/24 1726    Levander Houston, MD 04/08/24 [phone]

## 2024-04-07 NOTE — ED Notes (Signed)
 Posy pad placed and fall risk bractlet placed on pt

## 2024-04-07 NOTE — ED Notes (Signed)
 Family called [facility] and will transport pt to [facility]. [facility] at [facility] notified to cancel transport request

## 2024-04-13 ENCOUNTER — Emergency Department (HOSPITAL_COMMUNITY)

## 2024-04-13 ENCOUNTER — Encounter (HOSPITAL_COMMUNITY): Payer: Self-pay

## 2024-04-13 ENCOUNTER — Emergency Department (HOSPITAL_COMMUNITY)
Admission: EM | Admit: 2024-04-13 | Discharge: 2024-04-13 | Disposition: A | Discharge status: Skilled Nursing Facility | Attending: Emergency Medicine | Admitting: Emergency Medicine

## 2024-04-13 ENCOUNTER — Other Ambulatory Visit: Payer: Self-pay

## 2024-04-13 DIAGNOSIS — I10 Essential (primary) hypertension: Secondary | ICD-10-CM | POA: Diagnosis not present

## 2024-04-13 DIAGNOSIS — R0789 Other chest pain: Secondary | ICD-10-CM | POA: Diagnosis present

## 2024-04-13 DIAGNOSIS — R918 Other nonspecific abnormal finding of lung field: Secondary | ICD-10-CM | POA: Diagnosis not present

## 2024-04-13 DIAGNOSIS — I2510 Atherosclerotic heart disease of native coronary artery without angina pectoris: Secondary | ICD-10-CM | POA: Diagnosis not present

## 2024-04-13 DIAGNOSIS — K5730 Diverticulosis of large intestine without perforation or abscess without bleeding: Secondary | ICD-10-CM | POA: Insufficient documentation

## 2024-04-13 DIAGNOSIS — E1165 Type 2 diabetes mellitus with hyperglycemia: Secondary | ICD-10-CM | POA: Diagnosis not present

## 2024-04-13 DIAGNOSIS — Z7982 Long term (current) use of aspirin: Secondary | ICD-10-CM | POA: Insufficient documentation

## 2024-04-13 DIAGNOSIS — W19XXXA Unspecified fall, initial encounter: Secondary | ICD-10-CM | POA: Insufficient documentation

## 2024-04-13 DIAGNOSIS — I700 Atherosclerosis of aorta: Secondary | ICD-10-CM | POA: Diagnosis not present

## 2024-04-13 DIAGNOSIS — S2232XA Fracture of one rib, left side, initial encounter for closed fracture: Secondary | ICD-10-CM | POA: Diagnosis not present

## 2024-04-13 DIAGNOSIS — Z79899 Other long term (current) drug therapy: Secondary | ICD-10-CM | POA: Diagnosis not present

## 2024-04-13 DIAGNOSIS — Z8673 Personal history of transient ischemic attack (TIA), and cerebral infarction without residual deficits: Secondary | ICD-10-CM | POA: Insufficient documentation

## 2024-04-13 DIAGNOSIS — R410 Disorientation, unspecified: Secondary | ICD-10-CM | POA: Diagnosis not present

## 2024-04-13 DIAGNOSIS — Z794 Long term (current) use of insulin: Secondary | ICD-10-CM | POA: Insufficient documentation

## 2024-04-13 DIAGNOSIS — E119 Type 2 diabetes mellitus without complications: Secondary | ICD-10-CM | POA: Insufficient documentation

## 2024-04-13 LAB — COMPREHENSIVE METABOLIC PANEL WITH GFR
ALT: 19 U/L (ref 0–44)
AST: 31 U/L (ref 15–41)
Albumin: 3.4 g/dL — ABNORMAL LOW (ref 3.5–5.0)
Alkaline Phosphatase: 141 U/L — ABNORMAL HIGH (ref 38–126)
Anion gap: 12 (ref 5–15)
BUN: 24 mg/dL — ABNORMAL HIGH (ref 8–23)
CO2: 22 mmol/L (ref 22–32)
Calcium: 9.5 mg/dL (ref 8.9–10.3)
Chloride: 101 mmol/L (ref 98–111)
Creatinine, Ser: 1.02 mg/dL — ABNORMAL HIGH (ref 0.44–1.00)
GFR, Estimated: 52 mL/min — ABNORMAL LOW (ref >60)
Glucose, Bld: 309 mg/dL — ABNORMAL HIGH (ref 70–99)
Potassium: 4.3 mmol/L (ref 3.5–5.1)
Sodium: 135 mmol/L (ref 135–145)
Total Bilirubin: 0.4 mg/dL (ref 0.0–1.2)
Total Protein: 6.6 g/dL (ref 6.5–8.1)

## 2024-04-13 LAB — URINALYSIS, ROUTINE W REFLEX MICROSCOPIC
Bilirubin Urine: NEGATIVE
Glucose, UA: 150 mg/dL — AB
Hgb urine dipstick: NEGATIVE
Ketones, ur: NEGATIVE mg/dL
Nitrite: NEGATIVE
Protein, ur: NEGATIVE mg/dL
Specific Gravity, Urine: 1.013 (ref 1.005–1.030)
pH: 6 (ref 5.0–8.0)

## 2024-04-13 LAB — CBC
HCT: 39.3 % (ref 36.0–46.0)
Hemoglobin: 12.4 g/dL (ref 12.0–15.0)
MCH: 25.6 pg — ABNORMAL LOW (ref 26.0–34.0)
MCHC: 31.6 g/dL (ref 30.0–36.0)
MCV: 81.2 fL (ref 80.0–100.0)
Platelets: 285 K/uL (ref 150–400)
RBC: 4.84 MIL/uL (ref 3.87–5.11)
RDW: 15.7 % — ABNORMAL HIGH (ref 11.5–15.5)
WBC: 7 K/uL (ref 4.0–10.5)
nRBC: 0 % (ref 0.0–0.2)

## 2024-04-13 LAB — I-STAT CHEM 8, ED
BUN: 26 mg/dL — ABNORMAL HIGH (ref 8–23)
Calcium, Ion: 1.18 mmol/L (ref 1.15–1.40)
Chloride: 104 mmol/L (ref 98–111)
Creatinine, Ser: 1.1 mg/dL — ABNORMAL HIGH (ref 0.44–1.00)
Glucose, Bld: 309 mg/dL — ABNORMAL HIGH (ref 70–99)
HCT: 41 % (ref 36.0–46.0)
Hemoglobin: 13.9 g/dL (ref 12.0–15.0)
Potassium: 4.3 mmol/L (ref 3.5–5.1)
Sodium: 136 mmol/L (ref 135–145)
TCO2: 25 mmol/L (ref 22–32)

## 2024-04-13 LAB — CK: Total CK: 92 U/L (ref 38–234)

## 2024-04-13 LAB — TROPONIN I (HIGH SENSITIVITY): Troponin I (High Sensitivity): 6 ng/L (ref <18)

## 2024-04-13 LAB — MAGNESIUM: Magnesium: 1.6 mg/dL — ABNORMAL LOW (ref 1.7–2.4)

## 2024-04-13 MED ORDER — INSULIN ASPART 100 UNIT/ML IJ SOLN
5.0000 [IU] | Freq: Once | INTRAMUSCULAR | Status: AC
  Administered 2024-04-13: 5 [IU] via SUBCUTANEOUS

## 2024-04-13 MED ORDER — MAGNESIUM OXIDE -MG SUPPLEMENT 400 (240 MG) MG PO TABS
800.0000 mg | ORAL_TABLET | Freq: Once | ORAL | Status: AC
  Administered 2024-04-13: 800 mg via ORAL
  Filled 2024-04-13: qty 2

## 2024-04-13 MED ORDER — LIDOCAINE 5 % EX PTCH
1.0000 | MEDICATED_PATCH | CUTANEOUS | Status: DC
  Administered 2024-04-13: 1 via TRANSDERMAL
  Filled 2024-04-13: qty 1

## 2024-04-13 MED ORDER — ACETAMINOPHEN 325 MG PO TABS
650.0000 mg | ORAL_TABLET | Freq: Once | ORAL | Status: DC
  Filled 2024-04-13: qty 2

## 2024-04-13 MED ORDER — LIDOCAINE 5 % EX PTCH: 1.0000 | MEDICATED_PATCH | CUTANEOUS | 0 refills | Status: DC

## 2024-04-13 NOTE — ED Notes (Signed)
 Son Donnella Morford [phone] would like an update asap

## 2024-04-13 NOTE — ED Provider Notes (Signed)
 [place] EMERGENCY DEPARTMENT AT [facility] Provider Note   CSN: [id number] Arrival date & time: 04/13/24  1827     Patient presents with: Kiara Blair is a 89 y.o. female.   HPI Patient presents after fall.  Medical history includes DM, HTN, HLD, TIA, GERD, CVA.  She is prescribed 81 mg aspirin  but no other blood thinning medications.  She was seen in the ED a week ago after a fall.  No injuries were identified at the time.  Today, she arrives via EMS from [facility] living.  She had an unwitnessed fall this evening.  Patient does not remember the fall.  She does endorse pain in area of left lateral chest wall and left shoulder.  She denies any other areas of discomfort.    Prior to Admission medications   Medication Sig Start Date End Date Taking? Authorizing Provider  lidocaine  (LIDODERM ) 5 % Place 1 patch onto the skin daily. Remove & Discard patch within 12 hours or as directed by MD 04/13/24  Yes Melvenia Motto, MD  acetaminophen  (TYLENOL ) 325 MG tablet Take 2 tablets (650 mg total) by mouth every 6 (six) hours as needed. Patient taking differently: Take 650 mg by mouth every 6 (six) hours as needed for mild pain (pain score 1-3) or moderate pain (pain score 4-6). 12/06/21   Elnor Jayson LABOR, DO  amLODipine -benazepril  (LOTREL) 5-20 MG per capsule Take 1 capsule by mouth at bedtime.    [provider]  aspirin  EC 81 MG tablet Take 1 tablet (81 mg total) by mouth daily. Swallow whole. 12/05/22   Lue Elsie BROCKS, MD  atorvastatin  (LIPITOR) 40 MG tablet Take 1 tablet (40 mg total) by mouth daily. 02/08/21   Whitfield Raisin, NP  Cholecalciferol (VITAMIN D3) 50 MCG (2000 UT) TABS Take 1 tablet by mouth daily. 01/01/23   [provider]  escitalopram  (LEXAPRO ) 5 MG tablet Take 5 mg by mouth daily. 01/26/23   [provider]  LANTUS SOLOSTAR 100 UNIT/ML Solostar Pen Inject 8 Units into the skin at bedtime. 02/20/24   [provider]   lidocaine  (XYLOCAINE ) 5 % ointment Apply 1 Application topically 4 (four) times daily as needed for moderate pain. Patient not taking: Reported on 02/25/2024 07/31/22   Long, Joshua G, MD  Multiple Vitamin (MULTIVITAMIN WITH MINERALS) TABS tablet Take 1 tablet by mouth in the morning and at bedtime. Dr. Whitakers Vitamins.    [provider]  NOVOLOG  FLEXPEN 100 UNIT/ML FlexPen Inject 5 Units into the skin in the morning and at bedtime. 01/08/23   [provider]  pregabalin  (LYRICA ) 100 MG capsule Take 1 capsule (100 mg total) by mouth 2 (two) times daily. 05/23/23   Armenta Canning, MD  propranolol  (INDERAL ) 10 MG tablet Take 10 mg by mouth 2 (two) times daily. 01/05/21   [provider]    Allergies: Doxycycline hyclate, Levofloxacin, Meloxicam, Naproxen-esomeprazole mg, Nitrofurantoin, Other, and Pneumococcal polysaccharide vaccine    Review of Systems  Cardiovascular:  Positive for chest pain (Left lateral chest wall).  Musculoskeletal:  Positive for arthralgias.  All other systems reviewed and are negative.   Updated Vital Signs BP (!) 140/97   Pulse 77   Temp 98 F (36.7 C) (Oral)   Resp 13   Wt 58.5 kg   SpO2 100%   BMI 22.85 kg/m   Physical Exam Vitals and nursing note reviewed.  Constitutional:      General: She is not in  acute distress.    Appearance: Normal appearance. She is well-developed. She is not ill-appearing, toxic-appearing or diaphoretic.  HENT:     Head: Normocephalic and atraumatic.     Right Ear: External ear normal.     Left Ear: External ear normal.     Nose: Nose normal.     Mouth/Throat:     Mouth: Mucous membranes are moist.  Eyes:     Extraocular Movements: Extraocular movements intact.     Conjunctiva/sclera: Conjunctivae normal.  Neck:     Comments: Cervical collar in place Cardiovascular:     Rate and Rhythm: Normal rate and regular rhythm.     Heart sounds: No murmur heard. Pulmonary:     Effort: Pulmonary  effort is normal. No respiratory distress.     Breath sounds: Normal breath sounds. No wheezing or rales.  Chest:     Chest wall: Tenderness present.  Abdominal:     General: There is no distension.     Palpations: Abdomen is soft.     Tenderness: There is no abdominal tenderness.  Musculoskeletal:        General: Tenderness present. No swelling or deformity. Normal range of motion.     Cervical back: Neck supple.  Skin:    General: Skin is warm and dry.     Coloration: Skin is not jaundiced or pale.  Neurological:     General: No focal deficit present.     Mental Status: She is alert. She is disoriented.  Psychiatric:        Mood and Affect: Mood normal.        Behavior: Behavior normal.     (all labs ordered are listed, but only abnormal results are displayed) Labs Reviewed  COMPREHENSIVE METABOLIC PANEL WITH GFR - Abnormal; Notable for the following components:      Result Value   Glucose, Bld 309 (*)    BUN 24 (*)    Creatinine, Ser 1.02 (*)    Albumin 3.4 (*)    Alkaline Phosphatase 141 (*)    GFR, Estimated 52 (*)    All other components within normal limits  CBC - Abnormal; Notable for the following components:   MCH 25.6 (*)    RDW 15.7 (*)    All other components within normal limits  URINALYSIS, ROUTINE W REFLEX MICROSCOPIC - Abnormal; Notable for the following components:   Glucose, UA 150 (*)    Leukocytes,Ua TRACE (*)    Bacteria, UA RARE (*)    All other components within normal limits  MAGNESIUM  - Abnormal; Notable for the following components:   Magnesium  1.6 (*)    All other components within normal limits  I-STAT CHEM 8, ED - Abnormal; Notable for the following components:   BUN 26 (*)    Creatinine, Ser 1.10 (*)    Glucose, Bld 309 (*)    All other components within normal limits  CK  TROPONIN I (HIGH SENSITIVITY)  TROPONIN I (HIGH SENSITIVITY)    EKG: EKG Interpretation Date/Time:  Sunday April 13 2024 19:21:53 EDT Ventricular Rate:   82 PR Interval:    QRS Duration:  99 QT Interval:  381 QTC Calculation: 445 R Axis:   92  Text Interpretation: Sinus rhythm Right axis deviation Nonspecific T abnormalities, lateral leads Confirmed by Melvenia Motto [phone]) on 04/13/2024 7:38:58 PM  Radiology: CT HEAD WO CONTRAST Result Date: 04/13/2024 CLINICAL DATA:  Head trauma, moderate-severe EXAM: CT HEAD WITHOUT CONTRAST TECHNIQUE: Contiguous axial images were obtained from  the base of the skull through the vertex without intravenous contrast. RADIATION DOSE REDUCTION: This exam was performed according to the departmental dose-optimization program which includes automated exposure control, adjustment of the mA and/or kV according to patient size and/or use of iterative reconstruction technique. COMPARISON:  04/07/2024 FINDINGS: Brain: There is atrophy and chronic small vessel disease changes. No acute intracranial abnormality. Specifically, no hemorrhage, hydrocephalus, mass lesion, acute infarction, or significant intracranial injury. Vascular: No hyperdense vessel or unexpected calcification. Skull: No acute calvarial abnormality. Sinuses/Orbits: No acute findings Other: None IMPRESSION: Atrophy, chronic microvascular disease. No acute intracranial abnormality. Electronically Signed   By: Franky Crease M.D.   On: 04/13/2024 20:25   CT CERVICAL SPINE WO CONTRAST Result Date: 04/13/2024 CLINICAL DATA:  Polytrauma, blunt EXAM: CT CERVICAL SPINE WITHOUT CONTRAST TECHNIQUE: Multidetector CT imaging of the cervical spine was performed without intravenous contrast. Multiplanar CT image reconstructions were also generated. RADIATION DOSE REDUCTION: This exam was performed according to the departmental dose-optimization program which includes automated exposure control, adjustment of the mA and/or kV according to patient size and/or use of iterative reconstruction technique. COMPARISON:  None Available. FINDINGS: Alignment: Normal Skull base and vertebrae: No  acute fracture. No primary bone lesion or focal pathologic process. Soft tissues and spinal canal: No prevertebral fluid or swelling. No visible canal hematoma. Disc levels: Disc space narrowing and spurring diffusely. Moderate degenerative facet disease, left greater than right. Multilevel bilateral neural foraminal narrowing. Upper chest: No acute findings Other: 3 cm right thyroid nodule. This has been evaluated on previous imaging. (ref: J Am Coll Radiol. 2015 Feb;12(2): 143-50). IMPRESSION: Degenerative disc and facet disease. No acute bony abnormality. Electronically Signed   By: Franky Crease M.D.   On: 04/13/2024 20:24   CT CHEST ABDOMEN PELVIS WO CONTRAST Result Date: 04/13/2024 CLINICAL DATA:  Polytrauma, blunt EXAM: CT CHEST, ABDOMEN AND PELVIS WITHOUT CONTRAST TECHNIQUE: Multidetector CT imaging of the chest, abdomen and pelvis was performed following the standard protocol without IV contrast. RADIATION DOSE REDUCTION: This exam was performed according to the departmental dose-optimization program which includes automated exposure control, adjustment of the mA and/or kV according to patient size and/or use of iterative reconstruction technique. COMPARISON:  None Available. FINDINGS: CT CHEST FINDINGS Cardiovascular: Heart is normal size. Three-vessel coronary artery disease and diffuse aortic atherosclerosis. No aortic aneurysm. Mediastinum/Nodes: Large hiatal hernia. No mediastinal, hilar, or axillary adenopathy. Right thyroid nodule measures 3 cm. This has been evaluated on previous imaging. (ref: J Am Coll Radiol. 2015 Feb;12(2): 143-50). Lungs/Pleura: Trace left pleural effusion, similar to prior study. Two small left lobe pulmonary nodules measuring up to 3 mm. No confluent airspace opacities. Linear scarring or atelectasis in the lung bases. No pneumothorax. Musculoskeletal: Chest wall soft tissues are unremarkable. Multiple chronic appearing posterior left rib fractures. These involve the  posterior left 9th through 11th ribs. There is a fracture through the lateral left 10th rib fracture which was not definitively seen on prior study and may reflect an acute rib fracture. CT ABDOMEN PELVIS FINDINGS Hepatobiliary: No focal liver abnormality is seen. Status post cholecystectomy. No biliary dilatation. No evidence of a hepatic injury. Pancreas: No focal abnormality or ductal dilatation. Spleen: No focal abnormality.  Normal size. Adrenals/Urinary Tract: No adrenal abnormality. No focal renal abnormality. No stones or hydronephrosis. Urinary bladder is unremarkable. Stomach/Bowel: Diffuse colonic diverticulosis. No active diverticulitis. Stomach and small bowel decompressed. No bowel obstruction or inflammatory process. Vascular/Lymphatic: Aortic atherosclerosis. No evidence of aneurysm or adenopathy. Reproductive: Prior hysterectomy.  No adnexal masses. Other: No free fluid or free air. Musculoskeletal: No acute bony abnormality. IMPRESSION: Trace left pleural effusion, similar to prior study. Probable acute left lateral 10th rib fracture. Multiple old posterior left rib fractures. Coronary artery disease, aortic atherosclerosis. Large hiatal hernia. Small left upper lobe pulmonary nodules measuring up to 3 mm. No follow-up needed if patient is low-risk (and has no known or suspected primary neoplasm). Non-contrast chest CT can be considered in 12 months if patient is high-risk. This recommendation follows the consensus statement: Guidelines for Management of Incidental Pulmonary Nodules Detected on CT Images: From the Fleischner Society 2017; Radiology 2017; 284:228-243. Colonic diverticulosis.  No active diverticulitis. Electronically Signed   By: Franky Crease M.D.   On: 04/13/2024 20:22   DG Pelvis Portable Result Date: 04/13/2024 CLINICAL DATA:  Initial evaluation for acute trauma, fall. EXAM: PORTABLE PELVIS 1-2 VIEWS COMPARISON:  Prior study from 02/24/2024 FINDINGS: Diffuse osteopenia noted,  limiting evaluation of fine osseous detail. No acute fracture dislocation. Bony pelvis intact. SI joints approximated. No pubic diastasis. Osteoarthritic changes noted about the hips. Advanced spondylosis within the lower lumbar spine. No visible soft tissue injury. Scattered vascular calcifications noted. IMPRESSION: No acute osseous abnormality about the pelvis. Electronically Signed   By: Morene Hoard M.D.   On: 04/13/2024 19:05   DG Shoulder Left Port Result Date: 04/13/2024 CLINICAL DATA:  Initial evaluation for acute trauma, fall. EXAM: LEFT SHOULDER COMPARISON:  Prior radiograph from 02/24/2024 FINDINGS: Diffuse osteopenia noted, somewhat limiting evaluation of fine osseous detail. No acute fracture dislocation. 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 No visible soft tissue abnormality. Visualized left hemithorax is clear. IMPRESSION: 1. No acute osseous abnormality about the left shoulder. 2. 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 Electronically Signed   By: Morene Hoard M.D.   On: 04/13/2024 19:04   DG Chest Port 1 View Result Date: 04/13/2024 CLINICAL DATA:  Initial evaluation for acute trauma, fall. EXAM: PORTABLE CHEST 1 VIEW COMPARISON:  Radiograph from 02/24/2024 FINDINGS: Cardiomegaly, stable. Mediastinal silhouette within normal limits. Aortic atherosclerosis. Lungs normally inflated. Mild chronic coarsening of the interstitial markings noted. No focal infiltrates. No pulmonary edema. Minimal blunting of the left costophrenic angle, suggesting a trace left pleural effusion. No pneumothorax. Diffuse osteopenia. No acute osseous finding. Osteoarthritic changes noted about the shoulders bilaterally. Degenerative spondylosis noted within the visualized spine. IMPRESSION: 1. No radiographic evidence for active cardiopulmonary disease. 2. Cardiomegaly without pulmonary edema. 3. Probable trace left pleural effusion.  Electronically Signed   By: Morene Hoard M.D.   On: 04/13/2024 19:02     Procedures   Medications Ordered in the ED  acetaminophen  (TYLENOL ) tablet 650 mg (650 mg Oral Not Given 04/13/24 1908)  magnesium  oxide (MAG-OX) tablet 800 mg (has no administration in time range)  lidocaine  (LIDODERM ) 5 % 1 patch (has no administration in time range)  insulin  aspart (novoLOG ) injection 5 Units (has no administration in time range)                                    Medical Decision Making Amount and/or Complexity of Data Reviewed Labs: ordered. Radiology: ordered.  Risk OTC drugs. Prescription drug management.   This patient presents to the ED for concern of fall, this involves an extensive number of treatment options, and is a complaint that carries with it a high risk of complications and morbidity.  The differential diagnosis  includes acute injuries   Co morbidities / Chronic conditions that complicate the patient evaluation  DM, HTN, HLD, TIA, GERD, CVA   Additional history obtained:  Additional history obtained from EMR External records from outside source obtained and reviewed including patient's son   Lab Tests:  I Ordered, and personally interpreted labs.  The pertinent results include: Hyperglycemia without evidence of DKA, hypomagnesemia with otherwise normal electrolytes, normal hemoglobin, no leukocytosis, normal troponin   Imaging Studies ordered:  I ordered imaging studies including x-ray of chest, pelvis, left shoulder; CT scan of head, cervical spine, chest, abdomen, pelvis I independently visualized and interpreted imaging which showed likely acute left 10th rib fracture without other acute findings I agree with the radiologist interpretation   Cardiac Monitoring: / EKG:  The patient was maintained on a cardiac monitor.  I personally viewed and interpreted the cardiac monitored which showed an underlying rhythm of: Sinus rhythm   Problem List / ED  Course / Critical interventions / Medication management  Patient presents after an unwitnessed fall.  She was found on the ground at her facility.  She does not remember the details of the fall.  On exam, she is disoriented.  She states that she lives in a house and that her husband is in the service.  She initially denied any areas of pain.  When pressing on her lateral chest wall, she does have tenderness to this area.  When ranging her left shoulder, she has pain to this area as well.  Exam is otherwise unremarkable.  Tylenol  was ordered for analgesia.  Workup was initiated.  Patient's lab work was notable for hypomagnesemia.  Replace magnesium  was ordered.  CT imaging showed what is likely an acute left 10th rib fracture.  This does correlate with her point tenderness on exam.  Lidocaine  patch was ordered.  There were no other acute findings on imaging.  I spoke with her son who confirms that she is at her mental baseline.  Her current situation is assisted living facility where many of her needs are cared for by staff.  She does wear a button around her neck.  Family has been considering getting her 24-hour caregiver.  Son does feel comfortable with her discharging home back to facility today. I ordered medication including Tylenol  and lidocaine  patch for analgesia; magnesium  oxide for hypomagnesemia; insulin  for hyperglycemia Reevaluation of the patient after these medicines showed that the patient improved I have reviewed the patients home medicines and have made adjustments as needed  Social Determinants of Health:  Resides in assisted living facility      Final diagnoses:  Fall, initial encounter  Closed fracture of one rib of left side, initial encounter    ED Discharge Orders          Ordered    lidocaine  (LIDODERM ) 5 %  Every 24 hours        04/13/24 2046               Melvenia Motto, MD 04/13/24 2046

## 2024-04-13 NOTE — ED Triage Notes (Signed)
 Pt BIB EMS from [facility] after having a ground level unwitnessed fall. Pt reports hitting her head. Pt does not take blood thinners. Pt reports left sided rib cage and shoulder pain. Pt does have dementia. C-collar in place on arrival. Pt has skin tear on right leg.

## 2024-04-13 NOTE — Discharge Instructions (Addendum)
 Your imaging showed 1 rib fracture on the left side.  Prescription for lidocaine  patches was sent to your pharmacy.  Take ibuprofen  and Tylenol  as needed for pain and soreness.  No other injuries were identified. Return to the emergency department for any new or worsening symptoms of concern.

## 2024-04-21 ENCOUNTER — Other Ambulatory Visit: Payer: Self-pay

## 2024-04-21 ENCOUNTER — Encounter (HOSPITAL_COMMUNITY): Payer: Self-pay

## 2024-04-21 ENCOUNTER — Emergency Department (HOSPITAL_COMMUNITY)

## 2024-04-21 ENCOUNTER — Emergency Department (HOSPITAL_COMMUNITY)
Admission: EM | Admit: 2024-04-21 | Discharge: 2024-04-21 | Disposition: A | Discharge status: Skilled Nursing Facility | Source: Skilled Nursing Facility | Attending: Emergency Medicine | Admitting: Emergency Medicine

## 2024-04-21 DIAGNOSIS — Z79899 Other long term (current) drug therapy: Secondary | ICD-10-CM | POA: Insufficient documentation

## 2024-04-21 DIAGNOSIS — Z7982 Long term (current) use of aspirin: Secondary | ICD-10-CM | POA: Insufficient documentation

## 2024-04-21 DIAGNOSIS — I10 Essential (primary) hypertension: Secondary | ICD-10-CM | POA: Insufficient documentation

## 2024-04-21 DIAGNOSIS — W1830XA Fall on same level, unspecified, initial encounter: Secondary | ICD-10-CM | POA: Insufficient documentation

## 2024-04-21 DIAGNOSIS — S270XXA Traumatic pneumothorax, initial encounter: Secondary | ICD-10-CM | POA: Diagnosis not present

## 2024-04-21 DIAGNOSIS — E119 Type 2 diabetes mellitus without complications: Secondary | ICD-10-CM | POA: Insufficient documentation

## 2024-04-21 DIAGNOSIS — S0003XA Contusion of scalp, initial encounter: Secondary | ICD-10-CM | POA: Insufficient documentation

## 2024-04-21 DIAGNOSIS — W19XXXA Unspecified fall, initial encounter: Secondary | ICD-10-CM

## 2024-04-21 NOTE — ED Notes (Signed)
 Patient transported to CT

## 2024-04-21 NOTE — ED Triage Notes (Signed)
 Pt BIB [facility] from [facility] d/t an unwitnessed fall at approx. 0810 this AM. Pt was found on the floor & was suspected to have stumbled while using her walker (per [facility]). Pt is altered at baseline, Alert to self, denies pain. C-collar placed on scene, hematoma/abrasion noted to Lt forehead. A&Ox# upon arrival, still denying pain, 110/70, 55 bpm, 18 resp, 94% on RA, CBG 187. Not on thinners.

## 2024-04-21 NOTE — Discharge Instructions (Addendum)
 The CAT scan today was normal without any sign of internal injury in the head or neck.  If you start having vomiting, confusion or severe headache you should return to the emergency room.

## 2024-04-21 NOTE — ED Provider Notes (Signed)
 [place] EMERGENCY DEPARTMENT AT [facility] Provider Note   CSN: [id number] Arrival date & time: 04/21/24  9092     Patient presents with: Kiara Blair is a 89 y.o. female.   Patient is a 89 year old female who lives at [facility] skilled facility with a history of resting tremor, hypertension, diabetes who is presenting today after a fall.  Facility reports that approximately 810 this morning patient had an unwitnessed fall.  She was found on the floor and they suspect that she stumbled while using her walker.  Patient reports she had finished breakfast and she got up and she thinks she tripped going forward.  She reports she may have felt a little dizzy right before it but denies feeling dizziness now.  She denies headache, neck pain, nausea or vomiting.  She feels that her vision is the same.  She has no pain in her arms or legs.  She denies any chest pain or shortness of breath.  Patient does not take any anticoagulation.  The history is provided by the patient, the EMS personnel and the nursing home.  Fall       Prior to Admission medications   Medication Sig Start Date End Date Taking? Authorizing Provider  acetaminophen  (TYLENOL ) 325 MG tablet Take 2 tablets (650 mg total) by mouth every 6 (six) hours as needed. Patient taking differently: Take 650 mg by mouth every 6 (six) hours as needed for mild pain (pain score 1-3) or moderate pain (pain score 4-6). 12/06/21   Elnor Jayson LABOR, DO  amLODipine -benazepril  (LOTREL) 5-20 MG per capsule Take 1 capsule by mouth at bedtime.    [provider]  aspirin  EC 81 MG tablet Take 1 tablet (81 mg total) by mouth daily. Swallow whole. 12/05/22   Lue Elsie BROCKS, MD  atorvastatin  (LIPITOR) 40 MG tablet Take 1 tablet (40 mg total) by mouth daily. 02/08/21   Whitfield Raisin, NP  Cholecalciferol (VITAMIN D3) 50 MCG (2000 UT) TABS Take 1 tablet by mouth daily. 01/01/23   [provider]  escitalopram   (LEXAPRO ) 5 MG tablet Take 5 mg by mouth daily. 01/26/23   [provider]  LANTUS  SOLOSTAR 100 UNIT/ML Solostar Pen Inject 8 Units into the skin at bedtime. 02/20/24   [provider]  lidocaine  (LIDODERM ) 5 % Place 1 patch onto the skin daily. Remove & Discard patch within 12 hours or as directed by MD 04/13/24   Melvenia Motto, MD  lidocaine  (XYLOCAINE ) 5 % ointment Apply 1 Application topically 4 (four) times daily as needed for moderate pain. Patient not taking: Reported on 02/25/2024 07/31/22   Long, Joshua G, MD  Multiple Vitamin (MULTIVITAMIN WITH MINERALS) TABS tablet Take 1 tablet by mouth in the morning and at bedtime. Dr. Whitakers Vitamins.    [provider]  NOVOLOG  FLEXPEN 100 UNIT/ML FlexPen Inject 5 Units into the skin in the morning and at bedtime. 01/08/23   [provider]  pregabalin  (LYRICA ) 100 MG capsule Take 1 capsule (100 mg total) by mouth 2 (two) times daily. 05/23/23   Armenta Canning, MD  propranolol  (INDERAL ) 10 MG tablet Take 10 mg by mouth 2 (two) times daily. 01/05/21   [provider]    Allergies: Doxycycline hyclate, Levofloxacin, Meloxicam, Naproxen-esomeprazole mg, Nitrofurantoin, Other, and Pneumococcal polysaccharide vaccine    Review of Systems  Updated Vital Signs BP (!) 154/63   Pulse (!) 54   Temp 97.6 F (36.4 C)   Resp 14  SpO2 91%   Physical Exam Vitals and nursing note reviewed.  Constitutional:      General: She is not in acute distress.    Appearance: She is well-developed.  HENT:     Head: Normocephalic and atraumatic.   Eyes:     Pupils: Pupils are equal, round, and reactive to light.  Cardiovascular:     Rate and Rhythm: Normal rate and regular rhythm.     Heart sounds: Normal heart sounds. No murmur heard.    No friction rub.  Pulmonary:     Effort: Pulmonary effort is normal.     Breath sounds: Normal breath sounds. No wheezing or rales.  Abdominal:     General: Bowel sounds are normal.  There is no distension.     Palpations: Abdomen is soft.     Tenderness: There is no abdominal tenderness. There is no guarding or rebound.  Musculoskeletal:        General: No tenderness. Normal range of motion.     Cervical back: Normal range of motion and neck supple. No tenderness.     Comments: No edema  Skin:    General: Skin is warm and dry.     Findings: No rash.  Neurological:     Mental Status: She is alert.     Cranial Nerves: No cranial nerve deficit.     Comments: Oriented to self.  Able to have a full conversation.  Tremor noted with lifting bilateral upper extremities but strength is normal.  5 out of 5 strength in upper and lower extremities.  No tremor noted in the lower extremities.  Psychiatric:        Behavior: Behavior normal.     (all labs ordered are listed, but only abnormal results are displayed) Labs Reviewed - No data to display  EKG: None  Radiology: CT Head Wo Contrast Result Date: 04/21/2024 CLINICAL DATA:  [place].  Hit head. EXAM: CT HEAD WITHOUT CONTRAST CT CERVICAL SPINE WITHOUT CONTRAST TECHNIQUE: Multidetector CT imaging of the head and cervical spine was performed following the standard protocol without intravenous contrast. Multiplanar CT image reconstructions of the cervical spine were also generated. RADIATION DOSE REDUCTION: This exam was performed according to the departmental dose-optimization program which includes automated exposure control, adjustment of the mA and/or kV according to patient size and/or use of iterative reconstruction technique. COMPARISON:  Head CT 04/13/2024 and cervical spine CT 04/07/2024 FINDINGS: CT HEAD FINDINGS Brain: Stable age related cerebral atrophy, ventriculomegaly and periventricular white matter disease. No extra-axial fluid collections are identified. No CT findings for acute hemispheric infarction or intracranial hemorrhage. No mass lesions. The brainstem and cerebellum are normal. Vascular: Stable vascular  calcifications. No aneurysm hyperdense vessels. Skull: No acute skull fracture. Sinuses/Orbits: Scattered ethmoid sinus disease. The mastoid air cells and middle ear cavities are clear. The globes are intact. Other: Small left frontal scalp hematoma. CT CERVICAL SPINE FINDINGS Alignment: The overall alignment is maintained. Stable degenerative subluxations and facet disease. Skull base and vertebrae: No acute fracture. No primary bone lesion or focal pathologic process. Soft tissues and spinal canal: No prevertebral fluid or swelling. No visible canal hematoma. Disc levels: The spinal canal is fairly generous. No large disc protrusions or canal stenosis. Stable mild multilevel bony foraminal stenosis due to uncinate spurring and facet disease. Upper chest: The lung apices are grossly clear. Other: Stable large right thyroid goiter with a 3.4 cm right thyroid lesion. This has been evaluated on previous imaging. (ref: J Am Coll Radiol.  2015 Feb;12(2): 143-50). IMPRESSION: 1. Stable age related cerebral atrophy, ventriculomegaly and periventricular white matter disease. 2. No acute intracranial findings or skull fracture. 3. Small left frontal scalp hematoma. 4. Stable degenerative cervical spondylosis with multilevel disc disease and facet disease but no acute cervical spine fracture. Electronically Signed   By: MYRTIS Stammer M.D.   On: 04/21/2024 10:14   CT Cervical Spine Wo Contrast Result Date: 04/21/2024 CLINICAL DATA:  Clemens.  Hit head. EXAM: CT HEAD WITHOUT CONTRAST CT CERVICAL SPINE WITHOUT CONTRAST TECHNIQUE: Multidetector CT imaging of the head and cervical spine was performed following the standard protocol without intravenous contrast. Multiplanar CT image reconstructions of the cervical spine were also generated. RADIATION DOSE REDUCTION: This exam was performed according to the departmental dose-optimization program which includes automated exposure control, adjustment of the mA and/or kV according to  patient size and/or use of iterative reconstruction technique. COMPARISON:  Head CT 04/13/2024 and cervical spine CT 04/07/2024 FINDINGS: CT HEAD FINDINGS Brain: Stable age related cerebral atrophy, ventriculomegaly and periventricular white matter disease. No extra-axial fluid collections are identified. No CT findings for acute hemispheric infarction or intracranial hemorrhage. No mass lesions. The brainstem and cerebellum are normal. Vascular: Stable vascular calcifications. No aneurysm hyperdense vessels. Skull: No acute skull fracture. Sinuses/Orbits: Scattered ethmoid sinus disease. The mastoid air cells and middle ear cavities are clear. The globes are intact. Other: Small left frontal scalp hematoma. CT CERVICAL SPINE FINDINGS Alignment: The overall alignment is maintained. Stable degenerative subluxations and facet disease. Skull base and vertebrae: No acute fracture. No primary bone lesion or focal pathologic process. Soft tissues and spinal canal: No prevertebral fluid or swelling. No visible canal hematoma. Disc levels: The spinal canal is fairly generous. No large disc protrusions or canal stenosis. Stable mild multilevel bony foraminal stenosis due to uncinate spurring and facet disease. Upper chest: The lung apices are grossly clear. Other: Stable large right thyroid goiter with a 3.4 cm right thyroid lesion. This has been evaluated on previous imaging. (ref: J Am Coll Radiol. 2015 Feb;12(2): 143-50). IMPRESSION: 1. Stable age related cerebral atrophy, ventriculomegaly and periventricular white matter disease. 2. No acute intracranial findings or skull fracture. 3. Small left frontal scalp hematoma. 4. Stable degenerative cervical spondylosis with multilevel disc disease and facet disease but no acute cervical spine fracture. Electronically Signed   By: MYRTIS Stammer M.D.   On: 04/21/2024 10:14     Procedures   Medications Ordered in the ED - No data to display                                   Medical Decision Making Amount and/or Complexity of Data Reviewed Radiology: ordered and independent interpretation performed. Decision-making details documented in ED Course.   Pt with multiple medical problems and comorbidities and presenting today with a complaint that caries a high risk for morbidity and mortality.  Here today after a fall at her skilled facility.  Patient has trauma to the left forehead.  Otherwise mentating at her baseline.  Awake and alert.  Patient is able to follow instructions and appears to have normal strength in all 4 extremities.  No area of pain noted.  Patient reports she may have felt a little dizzy with standing but she is not sure.  Denies any dizziness now.  Well-appearing on exam.  Patient's vital signs are reassuring patient was recently in the emergency room last week and  at that time had blood work done including a UA, CBC, CMP and EKG all that had no acute findings.  Only on an 81 mg aspirin .  Will rule out intracranial injury from fall today I have independently visualized and interpreted pt's images today.  Head CT without acute intercranial hemorrhage and no cervical fractures.  Radiology reports stable age-related cerebral atrophy ventriculomegaly and periventricular white matter disease which is unchanged.  Spondylolisthesis of the spine but no signs of fracture.  At this time do not feel that patient needs repeat labs given they were normal less than 1 week ago.  Feel that she is stable for discharge back to her facility.     Final diagnoses:  Fall, initial encounter  Contusion of scalp, initial encounter    ED Discharge Orders     None          Doretha Folks, MD 04/21/24 1030

## 2024-04-22 ENCOUNTER — Encounter (HOSPITAL_COMMUNITY): Payer: Self-pay

## 2024-04-22 ENCOUNTER — Other Ambulatory Visit: Payer: Self-pay

## 2024-04-22 ENCOUNTER — Inpatient Hospital Stay (HOSPITAL_COMMUNITY)
Admission: EM | Admit: 2024-04-22 | Discharge: 2024-04-26 | DRG: 200 | Disposition: A | Discharge status: Home Health | Source: Skilled Nursing Facility | Attending: Family Medicine | Admitting: Family Medicine

## 2024-04-22 ENCOUNTER — Emergency Department (HOSPITAL_COMMUNITY)

## 2024-04-22 DIAGNOSIS — M778 Other enthesopathies, not elsewhere classified: Secondary | ICD-10-CM | POA: Diagnosis present

## 2024-04-22 DIAGNOSIS — F05 Delirium due to known physiological condition: Secondary | ICD-10-CM | POA: Diagnosis not present

## 2024-04-22 DIAGNOSIS — E119 Type 2 diabetes mellitus without complications: Secondary | ICD-10-CM | POA: Diagnosis not present

## 2024-04-22 DIAGNOSIS — Z66 Do not resuscitate: Secondary | ICD-10-CM | POA: Diagnosis present

## 2024-04-22 DIAGNOSIS — S060XAA Concussion with loss of consciousness status unknown, initial encounter: Secondary | ICD-10-CM | POA: Diagnosis present

## 2024-04-22 DIAGNOSIS — I700 Atherosclerosis of aorta: Secondary | ICD-10-CM | POA: Diagnosis present

## 2024-04-22 DIAGNOSIS — Z9181 History of falling: Secondary | ICD-10-CM

## 2024-04-22 DIAGNOSIS — K449 Diaphragmatic hernia without obstruction or gangrene: Secondary | ICD-10-CM | POA: Diagnosis present

## 2024-04-22 DIAGNOSIS — Z7982 Long term (current) use of aspirin: Secondary | ICD-10-CM | POA: Diagnosis not present

## 2024-04-22 DIAGNOSIS — S0012XA Contusion of left eyelid and periocular area, initial encounter: Secondary | ICD-10-CM | POA: Diagnosis present

## 2024-04-22 DIAGNOSIS — Z9049 Acquired absence of other specified parts of digestive tract: Secondary | ICD-10-CM

## 2024-04-22 DIAGNOSIS — M542 Cervicalgia: Secondary | ICD-10-CM | POA: Diagnosis present

## 2024-04-22 DIAGNOSIS — Y93E1 Activity, personal bathing and showering: Secondary | ICD-10-CM

## 2024-04-22 DIAGNOSIS — S2242XA Multiple fractures of ribs, left side, initial encounter for closed fracture: Secondary | ICD-10-CM | POA: Diagnosis present

## 2024-04-22 DIAGNOSIS — E1140 Type 2 diabetes mellitus with diabetic neuropathy, unspecified: Secondary | ICD-10-CM | POA: Diagnosis present

## 2024-04-22 DIAGNOSIS — Z8249 Family history of ischemic heart disease and other diseases of the circulatory system: Secondary | ICD-10-CM

## 2024-04-22 DIAGNOSIS — Z886 Allergy status to analgesic agent status: Secondary | ICD-10-CM

## 2024-04-22 DIAGNOSIS — Z90710 Acquired absence of both cervix and uterus: Secondary | ICD-10-CM | POA: Diagnosis not present

## 2024-04-22 DIAGNOSIS — Z887 Allergy status to serum and vaccine status: Secondary | ICD-10-CM

## 2024-04-22 DIAGNOSIS — E1165 Type 2 diabetes mellitus with hyperglycemia: Secondary | ICD-10-CM | POA: Diagnosis present

## 2024-04-22 DIAGNOSIS — F0390 Unspecified dementia without behavioral disturbance: Secondary | ICD-10-CM | POA: Diagnosis present

## 2024-04-22 DIAGNOSIS — Z881 Allergy status to other antibiotic agents status: Secondary | ICD-10-CM

## 2024-04-22 DIAGNOSIS — W182XXA Fall in (into) shower or empty bathtub, initial encounter: Secondary | ICD-10-CM | POA: Diagnosis present

## 2024-04-22 DIAGNOSIS — D509 Iron deficiency anemia, unspecified: Secondary | ICD-10-CM | POA: Diagnosis present

## 2024-04-22 DIAGNOSIS — R296 Repeated falls: Secondary | ICD-10-CM | POA: Diagnosis present

## 2024-04-22 DIAGNOSIS — E7800 Pure hypercholesterolemia, unspecified: Secondary | ICD-10-CM | POA: Diagnosis present

## 2024-04-22 DIAGNOSIS — W19XXXA Unspecified fall, initial encounter: Secondary | ICD-10-CM | POA: Diagnosis not present

## 2024-04-22 DIAGNOSIS — S2232XA Fracture of one rib, left side, initial encounter for closed fracture: Secondary | ICD-10-CM | POA: Diagnosis not present

## 2024-04-22 DIAGNOSIS — M19012 Primary osteoarthritis, left shoulder: Secondary | ICD-10-CM | POA: Diagnosis present

## 2024-04-22 DIAGNOSIS — S0003XA Contusion of scalp, initial encounter: Secondary | ICD-10-CM | POA: Diagnosis present

## 2024-04-22 DIAGNOSIS — S80811A Abrasion, right lower leg, initial encounter: Secondary | ICD-10-CM | POA: Diagnosis present

## 2024-04-22 DIAGNOSIS — Z79899 Other long term (current) drug therapy: Secondary | ICD-10-CM | POA: Diagnosis not present

## 2024-04-22 DIAGNOSIS — Y92091 Bathroom in other non-institutional residence as the place of occurrence of the external cause: Secondary | ICD-10-CM

## 2024-04-22 DIAGNOSIS — S270XXA Traumatic pneumothorax, initial encounter: Secondary | ICD-10-CM | POA: Diagnosis present

## 2024-04-22 DIAGNOSIS — E079 Disorder of thyroid, unspecified: Secondary | ICD-10-CM | POA: Diagnosis present

## 2024-04-22 DIAGNOSIS — E049 Nontoxic goiter, unspecified: Secondary | ICD-10-CM | POA: Diagnosis present

## 2024-04-22 DIAGNOSIS — K219 Gastro-esophageal reflux disease without esophagitis: Secondary | ICD-10-CM | POA: Diagnosis present

## 2024-04-22 DIAGNOSIS — Z794 Long term (current) use of insulin: Secondary | ICD-10-CM | POA: Diagnosis not present

## 2024-04-22 DIAGNOSIS — E041 Nontoxic single thyroid nodule: Secondary | ICD-10-CM | POA: Diagnosis present

## 2024-04-22 DIAGNOSIS — G250 Essential tremor: Secondary | ICD-10-CM | POA: Diagnosis present

## 2024-04-22 DIAGNOSIS — I10 Essential (primary) hypertension: Secondary | ICD-10-CM | POA: Diagnosis present

## 2024-04-22 DIAGNOSIS — F411 Generalized anxiety disorder: Secondary | ICD-10-CM | POA: Diagnosis present

## 2024-04-22 DIAGNOSIS — Z8673 Personal history of transient ischemic attack (TIA), and cerebral infarction without residual deficits: Secondary | ICD-10-CM

## 2024-04-22 DIAGNOSIS — R410 Disorientation, unspecified: Secondary | ICD-10-CM | POA: Diagnosis not present

## 2024-04-22 DIAGNOSIS — M67912 Unspecified disorder of synovium and tendon, left shoulder: Secondary | ICD-10-CM

## 2024-04-22 DIAGNOSIS — S46002A Unspecified injury of muscle(s) and tendon(s) of the rotator cuff of left shoulder, initial encounter: Secondary | ICD-10-CM | POA: Diagnosis present

## 2024-04-22 LAB — CBC WITH DIFFERENTIAL/PLATELET
Abs Immature Granulocytes: 0.05 K/uL (ref 0.00–0.07)
Basophils Absolute: 0 K/uL (ref 0.0–0.1)
Basophils Relative: 0 %
Eosinophils Absolute: 0.1 K/uL (ref 0.0–0.5)
Eosinophils Relative: 1 %
HCT: 35.5 % — ABNORMAL LOW (ref 36.0–46.0)
Hemoglobin: 11.2 g/dL — ABNORMAL LOW (ref 12.0–15.0)
Immature Granulocytes: 1 %
Lymphocytes Relative: 14 %
Lymphs Abs: 1.6 K/uL (ref 0.7–4.0)
MCH: 25.2 pg — ABNORMAL LOW (ref 26.0–34.0)
MCHC: 31.5 g/dL (ref 30.0–36.0)
MCV: 79.8 fL — ABNORMAL LOW (ref 80.0–100.0)
Monocytes Absolute: 0.8 K/uL (ref 0.1–1.0)
Monocytes Relative: 7 %
Neutro Abs: 8.5 K/uL — ABNORMAL HIGH (ref 1.7–7.7)
Neutrophils Relative %: 77 %
Platelets: 272 K/uL (ref 150–400)
RBC: 4.45 MIL/uL (ref 3.87–5.11)
RDW: 15.8 % — ABNORMAL HIGH (ref 11.5–15.5)
WBC: 11.1 K/uL — ABNORMAL HIGH (ref 4.0–10.5)
nRBC: 0 % (ref 0.0–0.2)

## 2024-04-22 LAB — BASIC METABOLIC PANEL WITH GFR
Anion gap: 10 (ref 5–15)
BUN: 31 mg/dL — ABNORMAL HIGH (ref 8–23)
CO2: 22 mmol/L (ref 22–32)
Calcium: 9.3 mg/dL (ref 8.9–10.3)
Chloride: 101 mmol/L (ref 98–111)
Creatinine, Ser: 1.05 mg/dL — ABNORMAL HIGH (ref 0.44–1.00)
GFR, Estimated: 50 mL/min — ABNORMAL LOW (ref >60)
Glucose, Bld: 382 mg/dL — ABNORMAL HIGH (ref 70–99)
Potassium: 4.5 mmol/L (ref 3.5–5.1)
Sodium: 133 mmol/L — ABNORMAL LOW (ref 135–145)

## 2024-04-22 MED ORDER — OXYCODONE-ACETAMINOPHEN 5-325 MG PO TABS: 1.0000 | ORAL_TABLET | Freq: Four times a day (QID) | ORAL | Status: DC | PRN

## 2024-04-22 MED ORDER — ENOXAPARIN SODIUM 30 MG/0.3ML IJ SOSY
30.0000 mg | PREFILLED_SYRINGE | INTRAMUSCULAR | Status: DC
  Administered 2024-04-23 – 2024-04-26 (×4): 30 mg via SUBCUTANEOUS
  Filled 2024-04-22 (×4): qty 0.3

## 2024-04-22 MED ORDER — SENNOSIDES-DOCUSATE SODIUM 8.6-50 MG PO TABS: 1.0000 | ORAL_TABLET | Freq: Every evening | ORAL | Status: DC | PRN

## 2024-04-22 MED ORDER — INSULIN ASPART 100 UNIT/ML IJ SOLN
0.0000 [IU] | Freq: Every day | INTRAMUSCULAR | Status: DC
  Administered 2024-04-23: 4 [IU] via SUBCUTANEOUS
  Administered 2024-04-23 – 2024-04-24 (×2): 2 [IU] via SUBCUTANEOUS
  Administered 2024-04-25: 4 [IU] via SUBCUTANEOUS
  Filled 2024-04-22: qty 0.05

## 2024-04-22 MED ORDER — OXYCODONE HCL 5 MG PO TABS
2.5000 mg | ORAL_TABLET | Freq: Once | ORAL | Status: AC
  Administered 2024-04-22: 2.5 mg via ORAL
  Filled 2024-04-22: qty 1

## 2024-04-22 MED ORDER — INSULIN ASPART 100 UNIT/ML IJ SOLN
0.0000 [IU] | Freq: Three times a day (TID) | INTRAMUSCULAR | Status: DC
  Administered 2024-04-23: 11 [IU] via SUBCUTANEOUS
  Administered 2024-04-23: 5 [IU] via SUBCUTANEOUS
  Administered 2024-04-24: 8 [IU] via SUBCUTANEOUS
  Administered 2024-04-24 – 2024-04-25 (×3): 3 [IU] via SUBCUTANEOUS
  Administered 2024-04-25 – 2024-04-26 (×3): 5 [IU] via SUBCUTANEOUS
  Filled 2024-04-22: qty 0.15

## 2024-04-22 MED ORDER — ACETAMINOPHEN 500 MG PO TABS: 1000.0000 mg | ORAL_TABLET | Freq: Once | ORAL | Status: DC

## 2024-04-22 MED ORDER — SODIUM CHLORIDE 0.9 % IV SOLN: INTRAVENOUS | Status: DC

## 2024-04-22 MED ORDER — ONDANSETRON HCL 4 MG/2ML IJ SOLN: 4.0000 mg | Freq: Four times a day (QID) | INTRAMUSCULAR | Status: DC | PRN

## 2024-04-22 MED ORDER — ACETAMINOPHEN 500 MG PO TABS
1000.0000 mg | ORAL_TABLET | Freq: Three times a day (TID) | ORAL | Status: DC
  Administered 2024-04-23 – 2024-04-26 (×10): 1000 mg via ORAL
  Filled 2024-04-22 (×13): qty 2

## 2024-04-22 MED ORDER — LIDOCAINE 5 % EX PTCH
1.0000 | MEDICATED_PATCH | CUTANEOUS | Status: DC
  Administered 2024-04-23 – 2024-04-26 (×3): 1 via TRANSDERMAL
  Filled 2024-04-22 (×5): qty 1

## 2024-04-22 MED ORDER — ONDANSETRON HCL 4 MG PO TABS: 4.0000 mg | ORAL_TABLET | Freq: Four times a day (QID) | ORAL | Status: DC | PRN

## 2024-04-22 NOTE — ED Provider Notes (Signed)
 [place] EMERGENCY DEPARTMENT AT [facility] Provider Note   CSN: [id number] Arrival date & time: 04/22/24  1930     Patient presents with: Kiara Blair is a 89 y.o. female.  {Add pertinent medical, surgical, social history, OB history to HPI:32947} 89 yo F with a chief complaint of a fall.  Patient was actually seen yesterday for a fall.  Family tells me that she fell again today about 3 PM when she was in the shower.  Fell onto her left side.  Complaining of left-sided chest and back pain.  They do think that maybe she struck her head again.  Complaining of left shoulder pain as well.   Fall       Prior to Admission medications   Medication Sig Start Date End Date Taking? Authorizing Provider  acetaminophen  (TYLENOL ) 325 MG tablet Take 2 tablets (650 mg total) by mouth every 6 (six) hours as needed. Patient taking differently: Take 650 mg by mouth every 6 (six) hours as needed for mild pain (pain score 1-3) or moderate pain (pain score 4-6). 12/06/21   Elnor Jayson LABOR, DO  amLODipine -benazepril  (LOTREL) 5-20 MG per capsule Take 1 capsule by mouth at bedtime.    [provider]  aspirin  EC 81 MG tablet Take 1 tablet (81 mg total) by mouth daily. Swallow whole. 12/05/22   Lue Elsie BROCKS, MD  atorvastatin  (LIPITOR) 40 MG tablet Take 1 tablet (40 mg total) by mouth daily. 02/08/21   Whitfield Raisin, NP  Cholecalciferol (VITAMIN D3) 50 MCG (2000 UT) TABS Take 1 tablet by mouth daily. 01/01/23   [provider]  escitalopram  (LEXAPRO ) 5 MG tablet Take 5 mg by mouth daily. 01/26/23   [provider]  LANTUS  SOLOSTAR 100 UNIT/ML Solostar Pen Inject 8 Units into the skin at bedtime. 02/20/24   [provider]  lidocaine  (LIDODERM ) 5 % Place 1 patch onto the skin daily. Remove & Discard patch within 12 hours or as directed by MD 04/13/24   Melvenia Motto, MD  lidocaine  (XYLOCAINE ) 5 % ointment Apply 1 Application topically 4 (four) times daily  as needed for moderate pain. Patient not taking: Reported on 02/25/2024 07/31/22   Long, Joshua G, MD  Multiple Vitamin (MULTIVITAMIN WITH MINERALS) TABS tablet Take 1 tablet by mouth in the morning and at bedtime. Dr. Whitakers Vitamins.    [provider]  NOVOLOG  FLEXPEN 100 UNIT/ML FlexPen Inject 5 Units into the skin in the morning and at bedtime. 01/08/23   [provider]  pregabalin  (LYRICA ) 100 MG capsule Take 1 capsule (100 mg total) by mouth 2 (two) times daily. 05/23/23   Armenta Canning, MD  propranolol  (INDERAL ) 10 MG tablet Take 10 mg by mouth 2 (two) times daily. 01/05/21   [provider]    Allergies: Doxycycline hyclate, Levofloxacin, Meloxicam, Naproxen-esomeprazole mg, Nitrofurantoin, Other, and Pneumococcal polysaccharide vaccine    Review of Systems  Updated Vital Signs BP (!) 149/65 (BP Location: Left Arm)   Pulse 73   Resp 19   Ht 5' 3 (1.6 m)   Wt 58.5 kg   SpO2 92%   BMI 22.85 kg/m   Physical Exam Vitals and nursing note reviewed.  Constitutional:      General: She is not in acute distress.    Appearance: She is well-developed. She is not diaphoretic.  HENT:     Head: Normocephalic and atraumatic.  Eyes:     Pupils: Pupils are equal, round, and reactive  to light.  Cardiovascular:     Rate and Rhythm: Normal rate and regular rhythm.     Heart sounds: No murmur heard.    No friction rub. No gallop.  Pulmonary:     Effort: Pulmonary effort is normal.     Breath sounds: No wheezing or rales.  Abdominal:     General: There is no distension.     Palpations: Abdomen is soft.     Tenderness: There is no abdominal tenderness.  Musculoskeletal:        General: No tenderness.     Cervical back: Normal range of motion and neck supple.     Comments: Tenderness diffusely about the body.  She states when I am not palpating her and moves everything freely without any obvious discomfort.  Skin:    General: Skin is warm and dry.   Neurological:     Mental Status: She is alert and oriented to person, place, and time.  Psychiatric:        Behavior: Behavior normal.     (all labs ordered are listed, but only abnormal results are displayed) Labs Reviewed  CBC WITH DIFFERENTIAL/PLATELET - Abnormal; Notable for the following components:      Result Value   WBC 11.1 (*)    Hemoglobin 11.2 (*)    HCT 35.5 (*)    MCV 79.8 (*)    MCH 25.2 (*)    RDW 15.8 (*)    Neutro Abs 8.5 (*)    All other components within normal limits  BASIC METABOLIC PANEL WITH GFR - Abnormal; Notable for the following components:   Sodium 133 (*)    Glucose, Bld 382 (*)    BUN 31 (*)    Creatinine, Ser 1.05 (*)    GFR, Estimated 50 (*)    All other components within normal limits    EKG: None  Radiology: CT Head Wo Contrast Result Date: 04/22/2024 CLINICAL DATA:  Head trauma, minor (Age >= 65y); Neck trauma (Age >= 65y) EXAM: CT HEAD WITHOUT CONTRAST CT CERVICAL SPINE WITHOUT CONTRAST TECHNIQUE: Multidetector CT imaging of the head and cervical spine was performed following the standard protocol without intravenous contrast. Multiplanar CT image reconstructions of the cervical spine were also generated. RADIATION DOSE REDUCTION: This exam was performed according to the departmental dose-optimization program which includes automated exposure control, adjustment of the mA and/or kV according to patient size and/or use of iterative reconstruction technique. COMPARISON:  Ultrasound thyroid neck 02/23/2020, CT head and C-spine 04/21/2024 FINDINGS: CT HEAD FINDINGS Brain: Patchy and confluent areas of decreased attenuation are noted throughout the deep and periventricular white matter of the cerebral hemispheres bilaterally, compatible with chronic microvascular ischemic disease. No evidence of large-territorial acute infarction. No parenchymal hemorrhage. No mass lesion. No extra-axial collection. No mass effect or midline shift. No hydrocephalus.  Basilar cisterns are patent. Vascular: No hyperdense vessel. Atherosclerotic calcifications are present within the cavernous internal carotid and vertebral arteries. Skull: No acute fracture or focal lesion. Sinuses/Orbits: Left frontal sinus mucosal thickening. Otherwise paranasal sinuses and mastoid air cells are clear. Bilateral lens replacement. Otherwise the orbits are unremarkable. Other: None. CT CERVICAL SPINE FINDINGS Alignment: Normal. Skull base and vertebrae: Multilevel moderate degenerative changes spine. No associated severe osseous neural foraminal or central canal stenosis. No acute fracture. No aggressive appearing focal osseous lesion or focal pathologic process. Soft tissues and spinal canal: No prevertebral fluid or swelling. No visible canal hematoma. Upper chest: Trace left pneumothorax. Other: Enlarged right thyroid gland  with question underlying hypodense nodule measuring up to 3 cm. IMPRESSION: 1. Trace left pneumothorax. Please see separately dictated CT chest 04/22/2024. 2. No acute intracranial abnormality. 3. No acute displaced fracture or traumatic listhesis of the cervical spine. 4. Enlarged right thyroid gland with question underlying hypodense nodule measuring up to 3 cm. This has been evaluated on previous imaging ultrasound thyroid neck 02/23/2020. (ref: J Am Coll Radiol. 2015 Feb;12(2): 143-50). Electronically Signed   By: Morgane  Naveau M.D.   On: 04/22/2024 21:53   CT Cervical Spine Wo Contrast Result Date: 04/22/2024 CLINICAL DATA:  Head trauma, minor (Age >= 65y); Neck trauma (Age >= 65y) EXAM: CT HEAD WITHOUT CONTRAST CT CERVICAL SPINE WITHOUT CONTRAST TECHNIQUE: Multidetector CT imaging of the head and cervical spine was performed following the standard protocol without intravenous contrast. Multiplanar CT image reconstructions of the cervical spine were also generated. RADIATION DOSE REDUCTION: This exam was performed according to the departmental dose-optimization  program which includes automated exposure control, adjustment of the mA and/or kV according to patient size and/or use of iterative reconstruction technique. COMPARISON:  Ultrasound thyroid neck 02/23/2020, CT head and C-spine 04/21/2024 FINDINGS: CT HEAD FINDINGS Brain: Patchy and confluent areas of decreased attenuation are noted throughout the deep and periventricular white matter of the cerebral hemispheres bilaterally, compatible with chronic microvascular ischemic disease. No evidence of large-territorial acute infarction. No parenchymal hemorrhage. No mass lesion. No extra-axial collection. No mass effect or midline shift. No hydrocephalus. Basilar cisterns are patent. Vascular: No hyperdense vessel. Atherosclerotic calcifications are present within the cavernous internal carotid and vertebral arteries. Skull: No acute fracture or focal lesion. Sinuses/Orbits: Left frontal sinus mucosal thickening. Otherwise paranasal sinuses and mastoid air cells are clear. Bilateral lens replacement. Otherwise the orbits are unremarkable. Other: None. CT CERVICAL SPINE FINDINGS Alignment: Normal. Skull base and vertebrae: Multilevel moderate degenerative changes spine. No associated severe osseous neural foraminal or central canal stenosis. No acute fracture. No aggressive appearing focal osseous lesion or focal pathologic process. Soft tissues and spinal canal: No prevertebral fluid or swelling. No visible canal hematoma. Upper chest: Trace left pneumothorax. Other: Enlarged right thyroid gland with question underlying hypodense nodule measuring up to 3 cm. IMPRESSION: 1. Trace left pneumothorax. Please see separately dictated CT chest 04/22/2024. 2. No acute intracranial abnormality. 3. No acute displaced fracture or traumatic listhesis of the cervical spine. 4. Enlarged right thyroid gland with question underlying hypodense nodule measuring up to 3 cm. This has been evaluated on previous imaging ultrasound thyroid neck  02/23/2020. (ref: J Am Coll Radiol. 2015 Feb;12(2): 143-50). Electronically Signed   By: Morgane  Naveau M.D.   On: 04/22/2024 21:53   CT CHEST ABDOMEN PELVIS WO CONTRAST Result Date: 04/22/2024 CLINICAL DATA:  Clemens yesterday. Left shoulder and left chest and leg pain. EXAM: CT CHEST, ABDOMEN AND PELVIS WITHOUT CONTRAST TECHNIQUE: Multidetector CT imaging of the chest, abdomen and pelvis was performed following the standard protocol without IV contrast. RADIATION DOSE REDUCTION: This exam was performed according to the departmental dose-optimization program which includes automated exposure control, adjustment of the mA and/or kV according to patient size and/or use of iterative reconstruction technique. COMPARISON:  CT scan 04/13/2024 FINDINGS: CT CHEST FINDINGS Cardiovascular: The heart is within normal limits in size for age. No pericardial effusion. Stable tortuosity and calcification of the thoracic aorta and stable three-vessel coronary artery calcifications. Mediastinum/Nodes: No mediastinal or hilar mass or lymphadenopathy. 3.0 x 2.6 cm right thyroid lesion is unchanged. In the setting of significant comorbidities or  limited life expectancy, no follow-up recommended (ref: J Am Coll Radiol. 2015 Feb;12(2): 143-50).The esophagus is stable. Large hiatal hernia. Lungs/Pleura: Patchy inflammations/edema and small left pleural effusion with left lower lobe atelectasis. No pneumothorax. Musculoskeletal: There is a displaced left seventh lateral rib fracture with maximum displacement 11 mm. No associated pneumothorax. There is small pleural hematoma and adjacent atelectasis or contusion. There are also numerous posterior left rib fractures which are subacute/healing. The sternum and thoracic vertebral bodies are intact. CT ABDOMEN PELVIS FINDINGS Hepatobiliary: No hepatic lesions are identified without contrast. No evidence of acute hepatic injury. No perihepatic fluid collections. The gallbladder is surgically  absent. No common bile duct dilatation. Pancreas: Stable pancreatic atrophy but no mass or inflammation. Spleen: No acute splenic injury or perisplenic fluid collection. Adrenals/Urinary Tract: Adrenal glands and kidneys unremarkable and stable. The bladder is. Stomach/Bowel: Large hiatal hernia. Otherwise the stomach is unremarkable. The small bowel and colon are grossly normal without oral contrast. No obstructive findings. Severe sigmoid colon diverticulosis without findings for acute diverticulitis. Vascular/Lymphatic: Stable aortic and branch vessel calcifications but no aneurysm. Reproductive: Surgically absent. The uterus is surgically absent. The left ovary is still present and appears normal. I do not see the right ovary for certain but no adnexal mass. Other: No pelvic mass or adenopathy. No free pelvic fluid collections. No inguinal mass or adenopathy. No abdominal wall hernia or subcutaneous lesions. Musculoskeletal: No acute lumbar spine fracture. The bony pelvis is intact. Both hips are normally located. No hip fracture. IMPRESSION: 1. New/acute displaced left seventh lateral rib fracture with maximum displacement 11 mm. No associated pneumothorax. 2. Numerous posterior left rib fractures which are subacute/healing. 3. Patchy inflammation/edema and small left pleural effusion with left lower lobe atelectasis. 4. No acute abdominal/pelvic findings, mass lesions or adenopathy. 5. Large hiatal hernia. 6. Severe sigmoid colon diverticulosis without findings for acute diverticulitis. 7. Aortic atherosclerosis. Aortic Atherosclerosis (ICD10-I70.0). Electronically Signed   By: MYRTIS Stammer M.D.   On: 04/22/2024 21:50   DG Shoulder Left Result Date: 04/22/2024 CLINICAL DATA:  Shoulder pain after fall EXAM: LEFT SHOULDER - 2+ VIEW COMPARISON:  None Available. FINDINGS: There is no acute fracture or dislocation. There is severe glenohumeral and subacromial joint space narrowing sclerosis and osteophyte  formation. No acute fracture or dislocation. There is soft tissue calcifications adjacent to the greater tuberosity likely related to calcific tendinitis. The bones are diffusely osteopenic. IMPRESSION: 1. No acute fracture or dislocation. 2. Severe glenohumeral and subacromial degenerative changes. Findings compatible with rotator cuff tendinopathy. 3. Calcific tendinitis. Electronically Signed   By: Greig Pique M.D.   On: 04/22/2024 21:31   CT Head Wo Contrast Result Date: 04/21/2024 CLINICAL DATA:  Clemens.  Hit head. EXAM: CT HEAD WITHOUT CONTRAST CT CERVICAL SPINE WITHOUT CONTRAST TECHNIQUE: Multidetector CT imaging of the head and cervical spine was performed following the standard protocol without intravenous contrast. Multiplanar CT image reconstructions of the cervical spine were also generated. RADIATION DOSE REDUCTION: This exam was performed according to the departmental dose-optimization program which includes automated exposure control, adjustment of the mA and/or kV according to patient size and/or use of iterative reconstruction technique. COMPARISON:  Head CT 04/13/2024 and cervical spine CT 04/07/2024 FINDINGS: CT HEAD FINDINGS Brain: Stable age related cerebral atrophy, ventriculomegaly and periventricular white matter disease. No extra-axial fluid collections are identified. No CT findings for acute hemispheric infarction or intracranial hemorrhage. No mass lesions. The brainstem and cerebellum are normal. Vascular: Stable vascular calcifications. No aneurysm hyperdense vessels. Skull:  No acute skull fracture. Sinuses/Orbits: Scattered ethmoid sinus disease. The mastoid air cells and middle ear cavities are clear. The globes are intact. Other: Small left frontal scalp hematoma. CT CERVICAL SPINE FINDINGS Alignment: The overall alignment is maintained. Stable degenerative subluxations and facet disease. Skull base and vertebrae: No acute fracture. No primary bone lesion or focal pathologic  process. Soft tissues and spinal canal: No prevertebral fluid or swelling. No visible canal hematoma. Disc levels: The spinal canal is fairly generous. No large disc protrusions or canal stenosis. Stable mild multilevel bony foraminal stenosis due to uncinate spurring and facet disease. Upper chest: The lung apices are grossly clear. Other: Stable large right thyroid goiter with a 3.4 cm right thyroid lesion. This has been evaluated on previous imaging. (ref: J Am Coll Radiol. 2015 Feb;12(2): 143-50). IMPRESSION: 1. Stable age related cerebral atrophy, ventriculomegaly and periventricular white matter disease. 2. No acute intracranial findings or skull fracture. 3. Small left frontal scalp hematoma. 4. Stable degenerative cervical spondylosis with multilevel disc disease and facet disease but no acute cervical spine fracture. Electronically Signed   By: MYRTIS Stammer M.D.   On: 04/21/2024 10:14   CT Cervical Spine Wo Contrast Result Date: 04/21/2024 CLINICAL DATA:  Clemens.  Hit head. EXAM: CT HEAD WITHOUT CONTRAST CT CERVICAL SPINE WITHOUT CONTRAST TECHNIQUE: Multidetector CT imaging of the head and cervical spine was performed following the standard protocol without intravenous contrast. Multiplanar CT image reconstructions of the cervical spine were also generated. RADIATION DOSE REDUCTION: This exam was performed according to the departmental dose-optimization program which includes automated exposure control, adjustment of the mA and/or kV according to patient size and/or use of iterative reconstruction technique. COMPARISON:  Head CT 04/13/2024 and cervical spine CT 04/07/2024 FINDINGS: CT HEAD FINDINGS Brain: Stable age related cerebral atrophy, ventriculomegaly and periventricular white matter disease. No extra-axial fluid collections are identified. No CT findings for acute hemispheric infarction or intracranial hemorrhage. No mass lesions. The brainstem and cerebellum are normal. Vascular: Stable vascular  calcifications. No aneurysm hyperdense vessels. Skull: No acute skull fracture. Sinuses/Orbits: Scattered ethmoid sinus disease. The mastoid air cells and middle ear cavities are clear. The globes are intact. Other: Small left frontal scalp hematoma. CT CERVICAL SPINE FINDINGS Alignment: The overall alignment is maintained. Stable degenerative subluxations and facet disease. Skull base and vertebrae: No acute fracture. No primary bone lesion or focal pathologic process. Soft tissues and spinal canal: No prevertebral fluid or swelling. No visible canal hematoma. Disc levels: The spinal canal is fairly generous. No large disc protrusions or canal stenosis. Stable mild multilevel bony foraminal stenosis due to uncinate spurring and facet disease. Upper chest: The lung apices are grossly clear. Other: Stable large right thyroid goiter with a 3.4 cm right thyroid lesion. This has been evaluated on previous imaging. (ref: J Am Coll Radiol. 2015 Feb;12(2): 143-50). IMPRESSION: 1. Stable age related cerebral atrophy, ventriculomegaly and periventricular white matter disease. 2. No acute intracranial findings or skull fracture. 3. Small left frontal scalp hematoma. 4. Stable degenerative cervical spondylosis with multilevel disc disease and facet disease but no acute cervical spine fracture. Electronically Signed   By: MYRTIS Stammer M.D.   On: 04/21/2024 10:14    {Document cardiac monitor, telemetry assessment procedure when appropriate:32947} Procedures   Medications Ordered in the ED  acetaminophen  (TYLENOL ) tablet 1,000 mg (1,000 mg Oral Patient Refused/Not Given 04/22/24 2209)  oxyCODONE  (Oxy IR/ROXICODONE ) immediate release tablet 2.5 mg (2.5 mg Oral Given 04/22/24 2200)      {  Click here for ABCD2, HEART and other calculators REFRESH Note before signing:1}                              Medical Decision Making Amount and/or Complexity of Data Reviewed Labs: ordered. Radiology: ordered.  Risk OTC  drugs. Prescription drug management.   89 yo F with a chief complaints of a fall.  Patient was actually seen yesterday for the same.  She unfortunately fell again today while in the shower.  Complaining of left-sided discomfort.  Will obtain CT imaging.  Reassess.  CT with a significantly displaced left seventh rib fracture.  No pneumothorax.  There are some signs of possible pulmonary contusion as well.  Patient feeling mildly better but still having significant discomfort with minimal movement and palpation.  I discussed the case with Dr. Cameron, general surgery.  Recommended medical admission and general surgery to see in the hospital.  Mild leukocytosis.  No significant electrolyte abnormalities.    {Document critical care time when appropriate  Document review of labs and clinical decision tools ie CHADS2VASC2, etc  Document your independent review of radiology images and any outside records  Document your discussion with family members, caretakers and with consultants  Document social determinants of health affecting pt's care  Document your decision making why or why not admission, treatments were needed:32947:::1}   Final diagnoses:  Closed fracture of one rib of left side, initial encounter    ED Discharge Orders     None

## 2024-04-22 NOTE — H&P (Incomplete)
 History and Physical  Kiara Blair FMW:[id number] DOB: 1930-05-24 DOA: 04/22/2024  PCP: Neysa Tinnie BRAVO, PA   Chief Complaint: Fall, left-sided pain  HPI: Kiara Blair is a 89 y.o. female with medical history significant for T2DM, HTN, HLD, TIA, essential tremor, GERD, frequent falls c/b rib fractures, anxiety and neuropathy who presents back to the ED after a second fall in the last 2 days. Patient was evaluated at [facility] ED yesterday after a fall and discharged back to her facility after unremarkable workup. Patient reports she fell again in the afternoon while she was in the shower, falling on her left side. Per son, the facility did not take patient to the ER right away until patient started complaining of left shoulder, left flank and back pain in the evening. Reports that patient has had multiple falls in the past leading to rib fractures and has had intermittent dizziness. Patient endorses left chest pain with deep breaths but denies any dizziness, headache, nausea, vomiting, abdominal pain or leg pain.  ED Course: Initial vitals overall stable but hypertensive with BP of 149/65. Initial labs significant for sodium 133, glucose 382, creatinine 1.05, WBC 11.1, Hgb 11.2, platelet 272.  Negative CT head and CT cervical spine.  CT C/A/P shows new acute displaced left seventh lateral rib fracture with maximum displacement of 11 mm but no pneumothorax. Pt received oxycodone  2.5 mg x 1 and Tylenol  1 g x 1. General surgery was consulted for evaluation. [facility] was consulted for admission.   Review of Systems: Please see HPI for pertinent positives and negatives. A complete 10 system review of systems are otherwise negative.  Past Medical History:  Diagnosis Date  . Diabetes mellitus without complication (HCC)   . Hypercholesteremia   . Hypertension   . TIA (transient ischemic attack) 01/03/2021  . Tremor    right arm   Past Surgical History:  Procedure Laterality Date  . ABDOMINAL HYSTERECTOMY    .  APPENDECTOMY    . BACK SURGERY    . BLADDER SUSPENSION    . CHOLECYSTECTOMY    . EYE SURGERY    . TONSILLECTOMY     Social History:  reports that she has never smoked. She has never used smokeless tobacco. She reports that she does not drink alcohol  and does not use drugs.  Allergies  Allergen Reactions  . Doxycycline Hyclate Nausea And Vomiting  . Levofloxacin Nausea And Vomiting  . Meloxicam Nausea And Vomiting  . Naproxen-Esomeprazole Mg Nausea And Vomiting  . Nitrofurantoin Nausea And Vomiting  . Other     Pt states she is allergic to meds but does not know names  . Pneumococcal Polysaccharide Vaccine Itching    Family History  Problem Relation Age of Onset  . CAD Father      Prior to Admission medications   Medication Sig Start Date End Date Taking? Authorizing Provider  acetaminophen  (TYLENOL ) 325 MG tablet Take 2 tablets (650 mg total) by mouth every 6 (six) hours as needed. Patient taking differently: Take 650 mg by mouth every 6 (six) hours as needed for mild pain (pain score 1-3) or moderate pain (pain score 4-6). 12/06/21   Elnor Jayson LABOR, DO  amLODipine -benazepril  (LOTREL) 5-20 MG per capsule Take 1 capsule by mouth at bedtime.    [provider]  aspirin  EC 81 MG tablet Take 1 tablet (81 mg total) by mouth daily. Swallow whole. 12/05/22   Lue Elsie BROCKS, MD  atorvastatin  (LIPITOR) 40 MG tablet Take 1 tablet (40 mg  total) by mouth daily. 02/08/21   Whitfield Raisin, NP  Cholecalciferol (VITAMIN D3) 50 MCG (2000 UT) TABS Take 1 tablet by mouth daily. 01/01/23   [provider]  escitalopram  (LEXAPRO ) 5 MG tablet Take 5 mg by mouth daily. 01/26/23   [provider]  LANTUS  SOLOSTAR 100 UNIT/ML Solostar Pen Inject 8 Units into the skin at bedtime. 02/20/24   [provider]  lidocaine  (LIDODERM ) 5 % Place 1 patch onto the skin daily. Remove & Discard patch within 12 hours or as directed by MD 04/13/24   Melvenia Motto, MD  lidocaine   (XYLOCAINE ) 5 % ointment Apply 1 Application topically 4 (four) times daily as needed for moderate pain. Patient not taking: Reported on 02/25/2024 07/31/22   Long, Joshua G, MD  Multiple Vitamin (MULTIVITAMIN WITH MINERALS) TABS tablet Take 1 tablet by mouth in the morning and at bedtime. Dr. Whitakers Vitamins.    [provider]  NOVOLOG  FLEXPEN 100 UNIT/ML FlexPen Inject 5 Units into the skin in the morning and at bedtime. 01/08/23   [provider]  pregabalin  (LYRICA ) 100 MG capsule Take 1 capsule (100 mg total) by mouth 2 (two) times daily. 05/23/23   Armenta Canning, MD  propranolol  (INDERAL ) 10 MG tablet Take 10 mg by mouth 2 (two) times daily. 01/05/21   [provider]    Physical Exam: BP (!) 149/65 (BP Location: Left Arm)   Pulse 73   Resp 19   Ht 5' 3 (1.6 m)   Wt 58.5 kg   SpO2 92%   BMI 22.85 kg/m  General: Pleasant, well-appearing *** laying in bed. No acute distress. HEENT: [place]/AT. Anicteric sclera CV: RRR. No murmurs, rubs, or gallops. No LE edema Pulmonary: Lungs CTAB. Normal effort. No wheezing or rales. Abdominal: Soft, nontender, nondistended. Normal bowel sounds. Extremities: Palpable radial and DP pulses. Normal ROM. Skin: Warm and dry. No obvious rash or lesions. Neuro: A&Ox3. Moves all extremities. Normal sensation to light touch. No focal deficit. Psych: Normal mood and affect          Labs on Admission:  Basic Metabolic Panel: Recent Labs  Lab 04/22/24 2208  NA 133*  K 4.5  CL 101  CO2 22  GLUCOSE 382*  BUN 31*  CREATININE 1.05*  CALCIUM  9.3   Liver Function Tests: No results for input(s): AST, ALT, ALKPHOS, BILITOT, PROT, ALBUMIN in the last 168 hours. No results for input(s): LIPASE, AMYLASE in the last 168 hours. No results for input(s): AMMONIA in the last 168 hours. CBC: Recent Labs  Lab 04/22/24 2208  WBC 11.1*  NEUTROABS 8.5*  HGB 11.2*  HCT 35.5*  MCV 79.8*  PLT 272   Cardiac  Enzymes: No results for input(s): CKTOTAL, CKMB, CKMBINDEX, TROPONINI in the last 168 hours. BNP (last 3 results) No results for input(s): BNP in the last 8760 hours.  ProBNP (last 3 results) No results for input(s): PROBNP in the last 8760 hours.  CBG: No results for input(s): GLUCAP in the last 168 hours.  Radiological Exams on Admission: CT Head Wo Contrast Result Date: 04/22/2024 CLINICAL DATA:  Head trauma, minor (Age >= 65y); Neck trauma (Age >= 65y) EXAM: CT HEAD WITHOUT CONTRAST CT CERVICAL SPINE WITHOUT CONTRAST TECHNIQUE: Multidetector CT imaging of the head and cervical spine was performed following the standard protocol without intravenous contrast. Multiplanar CT image reconstructions of the cervical spine were also generated. RADIATION DOSE REDUCTION: This exam was performed according to the departmental dose-optimization program which includes automated  exposure control, adjustment of the mA and/or kV according to patient size and/or use of iterative reconstruction technique. COMPARISON:  Ultrasound thyroid neck 02/23/2020, CT head and C-spine 04/21/2024 FINDINGS: CT HEAD FINDINGS Brain: Patchy and confluent areas of decreased attenuation are noted throughout the deep and periventricular white matter of the cerebral hemispheres bilaterally, compatible with chronic microvascular ischemic disease. No evidence of large-territorial acute infarction. No parenchymal hemorrhage. No mass lesion. No extra-axial collection. No mass effect or midline shift. No hydrocephalus. Basilar cisterns are patent. Vascular: No hyperdense vessel. Atherosclerotic calcifications are present within the cavernous internal carotid and vertebral arteries. Skull: No acute fracture or focal lesion. Sinuses/Orbits: Left frontal sinus mucosal thickening. Otherwise paranasal sinuses and mastoid air cells are clear. Bilateral lens replacement. Otherwise the orbits are unremarkable. Other: None. CT CERVICAL  SPINE FINDINGS Alignment: Normal. Skull base and vertebrae: Multilevel moderate degenerative changes spine. No associated severe osseous neural foraminal or central canal stenosis. No acute fracture. No aggressive appearing focal osseous lesion or focal pathologic process. Soft tissues and spinal canal: No prevertebral fluid or swelling. No visible canal hematoma. Upper chest: Trace left pneumothorax. Other: Enlarged right thyroid gland with question underlying hypodense nodule measuring up to 3 cm. IMPRESSION: 1. Trace left pneumothorax. Please see separately dictated CT chest 04/22/2024. 2. No acute intracranial abnormality. 3. No acute displaced fracture or traumatic listhesis of the cervical spine. 4. Enlarged right thyroid gland with question underlying hypodense nodule measuring up to 3 cm. This has been evaluated on previous imaging ultrasound thyroid neck 02/23/2020. (ref: J Am Coll Radiol. 2015 Feb;12(2): 143-50). Electronically Signed   By: Morgane  Naveau M.D.   On: 04/22/2024 21:53   CT Cervical Spine Wo Contrast Result Date: 04/22/2024 CLINICAL DATA:  Head trauma, minor (Age >= 65y); Neck trauma (Age >= 65y) EXAM: CT HEAD WITHOUT CONTRAST CT CERVICAL SPINE WITHOUT CONTRAST TECHNIQUE: Multidetector CT imaging of the head and cervical spine was performed following the standard protocol without intravenous contrast. Multiplanar CT image reconstructions of the cervical spine were also generated. RADIATION DOSE REDUCTION: This exam was performed according to the departmental dose-optimization program which includes automated exposure control, adjustment of the mA and/or kV according to patient size and/or use of iterative reconstruction technique. COMPARISON:  Ultrasound thyroid neck 02/23/2020, CT head and C-spine 04/21/2024 FINDINGS: CT HEAD FINDINGS Brain: Patchy and confluent areas of decreased attenuation are noted throughout the deep and periventricular white matter of the cerebral hemispheres  bilaterally, compatible with chronic microvascular ischemic disease. No evidence of large-territorial acute infarction. No parenchymal hemorrhage. No mass lesion. No extra-axial collection. No mass effect or midline shift. No hydrocephalus. Basilar cisterns are patent. Vascular: No hyperdense vessel. Atherosclerotic calcifications are present within the cavernous internal carotid and vertebral arteries. Skull: No acute fracture or focal lesion. Sinuses/Orbits: Left frontal sinus mucosal thickening. Otherwise paranasal sinuses and mastoid air cells are clear. Bilateral lens replacement. Otherwise the orbits are unremarkable. Other: None. CT CERVICAL SPINE FINDINGS Alignment: Normal. Skull base and vertebrae: Multilevel moderate degenerative changes spine. No associated severe osseous neural foraminal or central canal stenosis. No acute fracture. No aggressive appearing focal osseous lesion or focal pathologic process. Soft tissues and spinal canal: No prevertebral fluid or swelling. No visible canal hematoma. Upper chest: Trace left pneumothorax. Other: Enlarged right thyroid gland with question underlying hypodense nodule measuring up to 3 cm. IMPRESSION: 1. Trace left pneumothorax. Please see separately dictated CT chest 04/22/2024. 2. No acute intracranial abnormality. 3. No acute displaced fracture or traumatic listhesis  of the cervical spine. 4. Enlarged right thyroid gland with question underlying hypodense nodule measuring up to 3 cm. This has been evaluated on previous imaging ultrasound thyroid neck 02/23/2020. (ref: J Am Coll Radiol. 2015 Feb;12(2): 143-50). Electronically Signed   By: Morgane  Naveau M.D.   On: 04/22/2024 21:53   CT CHEST ABDOMEN PELVIS WO CONTRAST Result Date: 04/22/2024 CLINICAL DATA:  Kiara Blair yesterday. Left shoulder and left chest and leg pain. EXAM: CT CHEST, ABDOMEN AND PELVIS WITHOUT CONTRAST TECHNIQUE: Multidetector CT imaging of the chest, abdomen and pelvis was performed  following the standard protocol without IV contrast. RADIATION DOSE REDUCTION: This exam was performed according to the departmental dose-optimization program which includes automated exposure control, adjustment of the mA and/or kV according to patient size and/or use of iterative reconstruction technique. COMPARISON:  CT scan 04/13/2024 FINDINGS: CT CHEST FINDINGS Cardiovascular: The heart is within normal limits in size for age. No pericardial effusion. Stable tortuosity and calcification of the thoracic aorta and stable three-vessel coronary artery calcifications. Mediastinum/Nodes: No mediastinal or hilar mass or lymphadenopathy. 3.0 x 2.6 cm right thyroid lesion is unchanged. In the setting of significant comorbidities or limited life expectancy, no follow-up recommended (ref: J Am Coll Radiol. 2015 Feb;12(2): 143-50).The esophagus is stable. Large hiatal hernia. Lungs/Pleura: Patchy inflammations/edema and small left pleural effusion with left lower lobe atelectasis. No pneumothorax. Musculoskeletal: There is a displaced left seventh lateral rib fracture with maximum displacement 11 mm. No associated pneumothorax. There is small pleural hematoma and adjacent atelectasis or contusion. There are also numerous posterior left rib fractures which are subacute/healing. The sternum and thoracic vertebral bodies are intact. CT ABDOMEN PELVIS FINDINGS Hepatobiliary: No hepatic lesions are identified without contrast. No evidence of acute hepatic injury. No perihepatic fluid collections. The gallbladder is surgically absent. No common bile duct dilatation. Pancreas: Stable pancreatic atrophy but no mass or inflammation. Spleen: No acute splenic injury or perisplenic fluid collection. Adrenals/Urinary Tract: Adrenal glands and kidneys unremarkable and stable. The bladder is. Stomach/Bowel: Large hiatal hernia. Otherwise the stomach is unremarkable. The small bowel and colon are grossly normal without oral contrast. No  obstructive findings. Severe sigmoid colon diverticulosis without findings for acute diverticulitis. Vascular/Lymphatic: Stable aortic and branch vessel calcifications but no aneurysm. Reproductive: Surgically absent. The uterus is surgically absent. The left ovary is still present and appears normal. I do not see the right ovary for certain but no adnexal mass. Other: No pelvic mass or adenopathy. No free pelvic fluid collections. No inguinal mass or adenopathy. No abdominal wall hernia or subcutaneous lesions. Musculoskeletal: No acute lumbar spine fracture. The bony pelvis is intact. Both hips are normally located. No hip fracture. IMPRESSION: 1. New/acute displaced left seventh lateral rib fracture with maximum displacement 11 mm. No associated pneumothorax. 2. Numerous posterior left rib fractures which are subacute/healing. 3. Patchy inflammation/edema and small left pleural effusion with left lower lobe atelectasis. 4. No acute abdominal/pelvic findings, mass lesions or adenopathy. 5. Large hiatal hernia. 6. Severe sigmoid colon diverticulosis without findings for acute diverticulitis. 7. Aortic atherosclerosis. Aortic Atherosclerosis (ICD10-I70.0). Electronically Signed   By: MYRTIS Stammer M.D.   On: 04/22/2024 21:50   DG Shoulder Left Result Date: 04/22/2024 CLINICAL DATA:  Shoulder pain after fall EXAM: LEFT SHOULDER - 2+ VIEW COMPARISON:  None Available. FINDINGS: There is no acute fracture or dislocation. There is severe glenohumeral and subacromial joint space narrowing sclerosis and osteophyte formation. No acute fracture or dislocation. There is soft tissue calcifications adjacent to the greater  tuberosity likely related to calcific tendinitis. The bones are diffusely osteopenic. IMPRESSION: 1. No acute fracture or dislocation. 2. Severe glenohumeral and subacromial degenerative changes. Findings compatible with rotator cuff tendinopathy. 3. Calcific tendinitis. Electronically Signed   By: Greig Pique M.D.   On: 04/22/2024 21:31   CT Head Wo Contrast Result Date: 04/21/2024 CLINICAL DATA:  Kiara Blair.  Hit head. EXAM: CT HEAD WITHOUT CONTRAST CT CERVICAL SPINE WITHOUT CONTRAST TECHNIQUE: Multidetector CT imaging of the head and cervical spine was performed following the standard protocol without intravenous contrast. Multiplanar CT image reconstructions of the cervical spine were also generated. RADIATION DOSE REDUCTION: This exam was performed according to the departmental dose-optimization program which includes automated exposure control, adjustment of the mA and/or kV according to patient size and/or use of iterative reconstruction technique. COMPARISON:  Head CT 04/13/2024 and cervical spine CT 04/07/2024 FINDINGS: CT HEAD FINDINGS Brain: Stable age related cerebral atrophy, ventriculomegaly and periventricular white matter disease. No extra-axial fluid collections are identified. No CT findings for acute hemispheric infarction or intracranial hemorrhage. No mass lesions. The brainstem and cerebellum are normal. Vascular: Stable vascular calcifications. No aneurysm hyperdense vessels. Skull: No acute skull fracture. Sinuses/Orbits: Scattered ethmoid sinus disease. The mastoid air cells and middle ear cavities are clear. The globes are intact. Other: Small left frontal scalp hematoma. CT CERVICAL SPINE FINDINGS Alignment: The overall alignment is maintained. Stable degenerative subluxations and facet disease. Skull base and vertebrae: No acute fracture. No primary bone lesion or focal pathologic process. Soft tissues and spinal canal: No prevertebral fluid or swelling. No visible canal hematoma. Disc levels: The spinal canal is fairly generous. No large disc protrusions or canal stenosis. Stable mild multilevel bony foraminal stenosis due to uncinate spurring and facet disease. Upper chest: The lung apices are grossly clear. Other: Stable large right thyroid goiter with a 3.4 cm right thyroid lesion.  This has been evaluated on previous imaging. (ref: J Am Coll Radiol. 2015 Feb;12(2): 143-50). IMPRESSION: 1. Stable age related cerebral atrophy, ventriculomegaly and periventricular white matter disease. 2. No acute intracranial findings or skull fracture. 3. Small left frontal scalp hematoma. 4. Stable degenerative cervical spondylosis with multilevel disc disease and facet disease but no acute cervical spine fracture. Electronically Signed   By: MYRTIS Stammer M.D.   On: 04/21/2024 10:14   CT Cervical Spine Wo Contrast Result Date: 04/21/2024 CLINICAL DATA:  Kiara Blair.  Hit head. EXAM: CT HEAD WITHOUT CONTRAST CT CERVICAL SPINE WITHOUT CONTRAST TECHNIQUE: Multidetector CT imaging of the head and cervical spine was performed following the standard protocol without intravenous contrast. Multiplanar CT image reconstructions of the cervical spine were also generated. RADIATION DOSE REDUCTION: This exam was performed according to the departmental dose-optimization program which includes automated exposure control, adjustment of the mA and/or kV according to patient size and/or use of iterative reconstruction technique. COMPARISON:  Head CT 04/13/2024 and cervical spine CT 04/07/2024 FINDINGS: CT HEAD FINDINGS Brain: Stable age related cerebral atrophy, ventriculomegaly and periventricular white matter disease. No extra-axial fluid collections are identified. No CT findings for acute hemispheric infarction or intracranial hemorrhage. No mass lesions. The brainstem and cerebellum are normal. Vascular: Stable vascular calcifications. No aneurysm hyperdense vessels. Skull: No acute skull fracture. Sinuses/Orbits: Scattered ethmoid sinus disease. The mastoid air cells and middle ear cavities are clear. The globes are intact. Other: Small left frontal scalp hematoma. CT CERVICAL SPINE FINDINGS Alignment: The overall alignment is maintained. Stable degenerative subluxations and facet disease. Skull base and vertebrae: No acute  fracture. No primary bone lesion or focal pathologic process. Soft tissues and spinal canal: No prevertebral fluid or swelling. No visible canal hematoma. Disc levels: The spinal canal is fairly generous. No large disc protrusions or canal stenosis. Stable mild multilevel bony foraminal stenosis due to uncinate spurring and facet disease. Upper chest: The lung apices are grossly clear. Other: Stable large right thyroid goiter with a 3.4 cm right thyroid lesion. This has been evaluated on previous imaging. (ref: J Am Coll Radiol. 2015 Feb;12(2): 143-50). IMPRESSION: 1. Stable age related cerebral atrophy, ventriculomegaly and periventricular white matter disease. 2. No acute intracranial findings or skull fracture. 3. Small left frontal scalp hematoma. 4. Stable degenerative cervical spondylosis with multilevel disc disease and facet disease but no acute cervical spine fracture. Electronically Signed   By: MYRTIS Stammer M.D.   On: 04/21/2024 10:14   Assessment/Plan Kymorah Hausman is a 89 y.o. female with medical history significant for T2DM, HTN, HLD, TIA, essential tremor, GERD, frequent falls c/b rib fractures, anxiety and neuropathy who presents back to the ED after a second fall in the last 2 days.   # Fall # Left 7th rib fracture  # T2DM - Last A1c 8.3% 1 month ago - Blood glucose of *** on admission - Home regimen includes *** - *** - SSI with meals, CBG monitoring - Carb modified diet - F/u repeat A1c  # HTN  - Continue amlodipine  and benazepril   # HLD # TIA - Continue aspirin  and atorvastatin   # Essential tremor - Continue propranolol   # GAD - Continue Lexapro   #***  DVT prophylaxis: Lovenox      Code Status: Prior  Consults called: General Surgery  Family Communication: ***  Severity of Illness: {Observation/Inpatient:21159}  Level of care: Med-Surg   This record has been created using Conservation officer, historic buildings. Errors have been sought and corrected, but may  not always be located. Such creation errors do not reflect on the standard of care.   Lou Claretta HERO, MD 04/22/2024, 11:10 PM [facility] Pager: [phone] Isaiah 41:10   If 7PM-7AM, please contact night-coverage [email or web address] Password [email or web address]

## 2024-04-22 NOTE — ED Notes (Signed)
 Pt missed cup for urine sample

## 2024-04-22 NOTE — H&P (Signed)
 History and Physical  Kiara Blair FMW:[id number] DOB: Sep 06, 1930 DOA: 04/22/2024  PCP: Neysa Tinnie BRAVO, PA   Chief Complaint: Fall, left-sided pain  HPI: Kiara Blair is a 89 y.o. female with medical history significant for T2DM, HTN, HLD, TIA, essential tremor, GERD, frequent falls c/b rib fractures, anxiety and neuropathy who presents back to the ED after a second fall in the last 2 days.  ED Course: Initial vitals overall stable but hypertensive with BP of 149/65. Initial labs significant for sodium 133, glucose 382, creatinine 1.05, WBC 11.1, Hgb 11.2, platelet 272.  Negative CT head and CT cervical spine.  CT C/A/P shows new acute displaced left seventh lateral rib fracture with maximum displacement of 11 mm but no pneumothorax. Pt received oxycodone  2.5 mg x 1 and Tylenol  1 g x 1. General surgery was consulted for evaluation. TR[facility] was consulted for admission.   Review of Systems: Please see HPI for pertinent positives and negatives. A complete 10 system review of systems are otherwise negative.  Past Medical History:  Diagnosis Date   Diabetes mellitus without complication (HCC)    Hypercholesteremia    Hypertension    TIA (transient ischemic attack) 01/03/2021   Tremor    right arm   Past Surgical History:  Procedure Laterality Date   ABDOMINAL HYSTERECTOMY     APPENDECTOMY     BACK SURGERY     BLADDER SUSPENSION     CHOLECYSTECTOMY     EYE SURGERY     TONSILLECTOMY     Social History:  reports that she has never smoked. She has never used smokeless tobacco. She reports that she does not drink alcohol  and does not use drugs.  Allergies  Allergen Reactions   Doxycycline Hyclate Nausea And Vomiting   Levofloxacin Nausea And Vomiting   Meloxicam Nausea And Vomiting   Naproxen-Esomeprazole Mg Nausea And Vomiting   Nitrofurantoin Nausea And Vomiting   Other     Pt states she is allergic to meds but does not know names   Pneumococcal Polysaccharide Vaccine Itching     Family History  Problem Relation Age of Onset   CAD Father      Prior to Admission medications   Medication Sig Start Date End Date Taking? Authorizing Provider  acetaminophen  (TYLENOL ) 325 MG tablet Take 2 tablets (650 mg total) by mouth every 6 (six) hours as needed. Patient taking differently: Take 650 mg by mouth every 6 (six) hours as needed for mild pain (pain score 1-3) or moderate pain (pain score 4-6). 12/06/21   Elnor Jayson LABOR, DO  amLODipine -benazepril  (LOTREL) 5-20 MG per capsule Take 1 capsule by mouth at bedtime.    [provider]  aspirin  EC 81 MG tablet Take 1 tablet (81 mg total) by mouth daily. Swallow whole. 12/05/22   Lue Elsie BROCKS, MD  atorvastatin  (LIPITOR) 40 MG tablet Take 1 tablet (40 mg total) by mouth daily. 02/08/21   Whitfield Raisin, NP  Cholecalciferol (VITAMIN D3) 50 MCG (2000 UT) TABS Take 1 tablet by mouth daily. 01/01/23   [provider]  escitalopram  (LEXAPRO ) 5 MG tablet Take 5 mg by mouth daily. 01/26/23   [provider]  LANTUS  SOLOSTAR 100 UNIT/ML Solostar Pen Inject 8 Units into the skin at bedtime. 02/20/24   [provider]  lidocaine  (LIDODERM ) 5 % Place 1 patch onto the skin daily. Remove & Discard patch within 12 hours or as directed by MD 04/13/24   Melvenia Motto, MD  lidocaine  (XYLOCAINE ) 5 %  ointment Apply 1 Application topically 4 (four) times daily as needed for moderate pain. Patient not taking: Reported on 02/25/2024 07/31/22   Long, Joshua G, MD  Multiple Vitamin (MULTIVITAMIN WITH MINERALS) TABS tablet Take 1 tablet by mouth in the morning and at bedtime. Dr. Whitakers Vitamins.    [provider]  NOVOLOG  FLEXPEN 100 UNIT/ML FlexPen Inject 5 Units into the skin in the morning and at bedtime. 01/08/23   [provider]  pregabalin  (LYRICA ) 100 MG capsule Take 1 capsule (100 mg total) by mouth 2 (two) times daily. 05/23/23   Armenta Canning, MD  propranolol  (INDERAL ) 10 MG tablet Take 10 mg  by mouth 2 (two) times daily. 01/05/21   [provider]    Physical Exam: BP (!) 149/65 (BP Location: Left Arm)   Pulse 73   Resp 19   Ht 5' 3 (1.6 m)   Wt 58.5 kg   SpO2 92%   BMI 22.85 kg/m  General: Pleasant, well-appearing *** laying in bed. No acute distress. HEENT: [place]/AT. Anicteric sclera CV: RRR. No murmurs, rubs, or gallops. No LE edema Pulmonary: Lungs CTAB. Normal effort. No wheezing or rales. Abdominal: Soft, nontender, nondistended. Normal bowel sounds. Extremities: Palpable radial and DP pulses. Normal ROM. Skin: Warm and dry. No obvious rash or lesions. Neuro: A&Ox3. Moves all extremities. Normal sensation to light touch. No focal deficit. Psych: Normal mood and affect          Labs on Admission:  Basic Metabolic Panel: Recent Labs  Lab 04/22/24 2208  NA 133*  K 4.5  CL 101  CO2 22  GLUCOSE 382*  BUN 31*  CREATININE 1.05*  CALCIUM  9.3   Liver Function Tests: No results for input(s): AST, ALT, ALKPHOS, BILITOT, PROT, ALBUMIN in the last 168 hours. No results for input(s): LIPASE, AMYLASE in the last 168 hours. No results for input(s): AMMONIA in the last 168 hours. CBC: Recent Labs  Lab 04/22/24 2208  WBC 11.1*  NEUTROABS 8.5*  HGB 11.2*  HCT 35.5*  MCV 79.8*  PLT 272   Cardiac Enzymes: No results for input(s): CKTOTAL, CKMB, CKMBINDEX, TROPONINI in the last 168 hours. BNP (last 3 results) No results for input(s): BNP in the last 8760 hours.  ProBNP (last 3 results) No results for input(s): PROBNP in the last 8760 hours.  CBG: No results for input(s): GLUCAP in the last 168 hours.  Radiological Exams on Admission: CT Head Wo Contrast Result Date: 04/22/2024 CLINICAL DATA:  Head trauma, minor (Age >= 65y); Neck trauma (Age >= 65y) EXAM: CT HEAD WITHOUT CONTRAST CT CERVICAL SPINE WITHOUT CONTRAST TECHNIQUE: Multidetector CT imaging of the head and cervical spine was performed following the standard  protocol without intravenous contrast. Multiplanar CT image reconstructions of the cervical spine were also generated. RADIATION DOSE REDUCTION: This exam was performed according to the departmental dose-optimization program which includes automated exposure control, adjustment of the mA and/or kV according to patient size and/or use of iterative reconstruction technique. COMPARISON:  Ultrasound thyroid neck 02/23/2020, CT head and C-spine 04/21/2024 FINDINGS: CT HEAD FINDINGS Brain: Patchy and confluent areas of decreased attenuation are noted throughout the deep and periventricular white matter of the cerebral hemispheres bilaterally, compatible with chronic microvascular ischemic disease. No evidence of large-territorial acute infarction. No parenchymal hemorrhage. No mass lesion. No extra-axial collection. No mass effect or midline shift. No hydrocephalus. Basilar cisterns are patent. Vascular: No hyperdense vessel. Atherosclerotic calcifications are present within the cavernous internal carotid and vertebral arteries. Skull:  No acute fracture or focal lesion. Sinuses/Orbits: Left frontal sinus mucosal thickening. Otherwise paranasal sinuses and mastoid air cells are clear. Bilateral lens replacement. Otherwise the orbits are unremarkable. Other: None. CT CERVICAL SPINE FINDINGS Alignment: Normal. Skull base and vertebrae: Multilevel moderate degenerative changes spine. No associated severe osseous neural foraminal or central canal stenosis. No acute fracture. No aggressive appearing focal osseous lesion or focal pathologic process. Soft tissues and spinal canal: No prevertebral fluid or swelling. No visible canal hematoma. Upper chest: Trace left pneumothorax. Other: Enlarged right thyroid gland with question underlying hypodense nodule measuring up to 3 cm. IMPRESSION: 1. Trace left pneumothorax. Please see separately dictated CT chest 04/22/2024. 2. No acute intracranial abnormality. 3. No acute displaced  fracture or traumatic listhesis of the cervical spine. 4. Enlarged right thyroid gland with question underlying hypodense nodule measuring up to 3 cm. This has been evaluated on previous imaging ultrasound thyroid neck 02/23/2020. (ref: J Am Coll Radiol. 2015 Feb;12(2): 143-50). Electronically Signed   By: Morgane  Naveau M.D.   On: 04/22/2024 21:53   CT Cervical Spine Wo Contrast Result Date: 04/22/2024 CLINICAL DATA:  Head trauma, minor (Age >= 65y); Neck trauma (Age >= 65y) EXAM: CT HEAD WITHOUT CONTRAST CT CERVICAL SPINE WITHOUT CONTRAST TECHNIQUE: Multidetector CT imaging of the head and cervical spine was performed following the standard protocol without intravenous contrast. Multiplanar CT image reconstructions of the cervical spine were also generated. RADIATION DOSE REDUCTION: This exam was performed according to the departmental dose-optimization program which includes automated exposure control, adjustment of the mA and/or kV according to patient size and/or use of iterative reconstruction technique. COMPARISON:  Ultrasound thyroid neck 02/23/2020, CT head and C-spine 04/21/2024 FINDINGS: CT HEAD FINDINGS Brain: Patchy and confluent areas of decreased attenuation are noted throughout the deep and periventricular white matter of the cerebral hemispheres bilaterally, compatible with chronic microvascular ischemic disease. No evidence of large-territorial acute infarction. No parenchymal hemorrhage. No mass lesion. No extra-axial collection. No mass effect or midline shift. No hydrocephalus. Basilar cisterns are patent. Vascular: No hyperdense vessel. Atherosclerotic calcifications are present within the cavernous internal carotid and vertebral arteries. Skull: No acute fracture or focal lesion. Sinuses/Orbits: Left frontal sinus mucosal thickening. Otherwise paranasal sinuses and mastoid air cells are clear. Bilateral lens replacement. Otherwise the orbits are unremarkable. Other: None. CT CERVICAL SPINE  FINDINGS Alignment: Normal. Skull base and vertebrae: Multilevel moderate degenerative changes spine. No associated severe osseous neural foraminal or central canal stenosis. No acute fracture. No aggressive appearing focal osseous lesion or focal pathologic process. Soft tissues and spinal canal: No prevertebral fluid or swelling. No visible canal hematoma. Upper chest: Trace left pneumothorax. Other: Enlarged right thyroid gland with question underlying hypodense nodule measuring up to 3 cm. IMPRESSION: 1. Trace left pneumothorax. Please see separately dictated CT chest 04/22/2024. 2. No acute intracranial abnormality. 3. No acute displaced fracture or traumatic listhesis of the cervical spine. 4. Enlarged right thyroid gland with question underlying hypodense nodule measuring up to 3 cm. This has been evaluated on previous imaging ultrasound thyroid neck 02/23/2020. (ref: J Am Coll Radiol. 2015 Feb;12(2): 143-50). Electronically Signed   By: Morgane  Naveau M.D.   On: 04/22/2024 21:53   CT CHEST ABDOMEN PELVIS WO CONTRAST Result Date: 04/22/2024 CLINICAL DATA:  Clemens yesterday. Left shoulder and left chest and leg pain. EXAM: CT CHEST, ABDOMEN AND PELVIS WITHOUT CONTRAST TECHNIQUE: Multidetector CT imaging of the chest, abdomen and pelvis was performed following the standard protocol without IV contrast. RADIATION DOSE  REDUCTION: This exam was performed according to the departmental dose-optimization program which includes automated exposure control, adjustment of the mA and/or kV according to patient size and/or use of iterative reconstruction technique. COMPARISON:  CT scan 04/13/2024 FINDINGS: CT CHEST FINDINGS Cardiovascular: The heart is within normal limits in size for age. No pericardial effusion. Stable tortuosity and calcification of the thoracic aorta and stable three-vessel coronary artery calcifications. Mediastinum/Nodes: No mediastinal or hilar mass or lymphadenopathy. 3.0 x 2.6 cm right thyroid  lesion is unchanged. In the setting of significant comorbidities or limited life expectancy, no follow-up recommended (ref: J Am Coll Radiol. 2015 Feb;12(2): 143-50).The esophagus is stable. Large hiatal hernia. Lungs/Pleura: Patchy inflammations/edema and small left pleural effusion with left lower lobe atelectasis. No pneumothorax. Musculoskeletal: There is a displaced left seventh lateral rib fracture with maximum displacement 11 mm. No associated pneumothorax. There is small pleural hematoma and adjacent atelectasis or contusion. There are also numerous posterior left rib fractures which are subacute/healing. The sternum and thoracic vertebral bodies are intact. CT ABDOMEN PELVIS FINDINGS Hepatobiliary: No hepatic lesions are identified without contrast. No evidence of acute hepatic injury. No perihepatic fluid collections. The gallbladder is surgically absent. No common bile duct dilatation. Pancreas: Stable pancreatic atrophy but no mass or inflammation. Spleen: No acute splenic injury or perisplenic fluid collection. Adrenals/Urinary Tract: Adrenal glands and kidneys unremarkable and stable. The bladder is. Stomach/Bowel: Large hiatal hernia. Otherwise the stomach is unremarkable. The small bowel and colon are grossly normal without oral contrast. No obstructive findings. Severe sigmoid colon diverticulosis without findings for acute diverticulitis. Vascular/Lymphatic: Stable aortic and branch vessel calcifications but no aneurysm. Reproductive: Surgically absent. The uterus is surgically absent. The left ovary is still present and appears normal. I do not see the right ovary for certain but no adnexal mass. Other: No pelvic mass or adenopathy. No free pelvic fluid collections. No inguinal mass or adenopathy. No abdominal wall hernia or subcutaneous lesions. Musculoskeletal: No acute lumbar spine fracture. The bony pelvis is intact. Both hips are normally located. No hip fracture. IMPRESSION: 1. New/acute  displaced left seventh lateral rib fracture with maximum displacement 11 mm. No associated pneumothorax. 2. Numerous posterior left rib fractures which are subacute/healing. 3. Patchy inflammation/edema and small left pleural effusion with left lower lobe atelectasis. 4. No acute abdominal/pelvic findings, mass lesions or adenopathy. 5. Large hiatal hernia. 6. Severe sigmoid colon diverticulosis without findings for acute diverticulitis. 7. Aortic atherosclerosis. Aortic Atherosclerosis (ICD10-I70.0). Electronically Signed   By: MYRTIS Stammer M.D.   On: 04/22/2024 21:50   DG Shoulder Left Result Date: 04/22/2024 CLINICAL DATA:  Shoulder pain after fall EXAM: LEFT SHOULDER - 2+ VIEW COMPARISON:  None Available. FINDINGS: There is no acute fracture or dislocation. There is severe glenohumeral and subacromial joint space narrowing sclerosis and osteophyte formation. No acute fracture or dislocation. There is soft tissue calcifications adjacent to the greater tuberosity likely related to calcific tendinitis. The bones are diffusely osteopenic. IMPRESSION: 1. No acute fracture or dislocation. 2. Severe glenohumeral and subacromial degenerative changes. Findings compatible with rotator cuff tendinopathy. 3. Calcific tendinitis. Electronically Signed   By: Greig Pique M.D.   On: 04/22/2024 21:31   CT Head Wo Contrast Result Date: 04/21/2024 CLINICAL DATA:  Clemens.  Hit head. EXAM: CT HEAD WITHOUT CONTRAST CT CERVICAL SPINE WITHOUT CONTRAST TECHNIQUE: Multidetector CT imaging of the head and cervical spine was performed following the standard protocol without intravenous contrast. Multiplanar CT image reconstructions of the cervical spine were also generated. RADIATION  DOSE REDUCTION: This exam was performed according to the departmental dose-optimization program which includes automated exposure control, adjustment of the mA and/or kV according to patient size and/or use of iterative reconstruction technique.  COMPARISON:  Head CT 04/13/2024 and cervical spine CT 04/07/2024 FINDINGS: CT HEAD FINDINGS Brain: Stable age related cerebral atrophy, ventriculomegaly and periventricular white matter disease. No extra-axial fluid collections are identified. No CT findings for acute hemispheric infarction or intracranial hemorrhage. No mass lesions. The brainstem and cerebellum are normal. Vascular: Stable vascular calcifications. No aneurysm hyperdense vessels. Skull: No acute skull fracture. Sinuses/Orbits: Scattered ethmoid sinus disease. The mastoid air cells and middle ear cavities are clear. The globes are intact. Other: Small left frontal scalp hematoma. CT CERVICAL SPINE FINDINGS Alignment: The overall alignment is maintained. Stable degenerative subluxations and facet disease. Skull base and vertebrae: No acute fracture. No primary bone lesion or focal pathologic process. Soft tissues and spinal canal: No prevertebral fluid or swelling. No visible canal hematoma. Disc levels: The spinal canal is fairly generous. No large disc protrusions or canal stenosis. Stable mild multilevel bony foraminal stenosis due to uncinate spurring and facet disease. Upper chest: The lung apices are grossly clear. Other: Stable large right thyroid goiter with a 3.4 cm right thyroid lesion. This has been evaluated on previous imaging. (ref: J Am Coll Radiol. 2015 Feb;12(2): 143-50). IMPRESSION: 1. Stable age related cerebral atrophy, ventriculomegaly and periventricular white matter disease. 2. No acute intracranial findings or skull fracture. 3. Small left frontal scalp hematoma. 4. Stable degenerative cervical spondylosis with multilevel disc disease and facet disease but no acute cervical spine fracture. Electronically Signed   By: MYRTIS Stammer M.D.   On: 04/21/2024 10:14   CT Cervical Spine Wo Contrast Result Date: 04/21/2024 CLINICAL DATA:  Clemens.  Hit head. EXAM: CT HEAD WITHOUT CONTRAST CT CERVICAL SPINE WITHOUT CONTRAST TECHNIQUE:  Multidetector CT imaging of the head and cervical spine was performed following the standard protocol without intravenous contrast. Multiplanar CT image reconstructions of the cervical spine were also generated. RADIATION DOSE REDUCTION: This exam was performed according to the departmental dose-optimization program which includes automated exposure control, adjustment of the mA and/or kV according to patient size and/or use of iterative reconstruction technique. COMPARISON:  Head CT 04/13/2024 and cervical spine CT 04/07/2024 FINDINGS: CT HEAD FINDINGS Brain: Stable age related cerebral atrophy, ventriculomegaly and periventricular white matter disease. No extra-axial fluid collections are identified. No CT findings for acute hemispheric infarction or intracranial hemorrhage. No mass lesions. The brainstem and cerebellum are normal. Vascular: Stable vascular calcifications. No aneurysm hyperdense vessels. Skull: No acute skull fracture. Sinuses/Orbits: Scattered ethmoid sinus disease. The mastoid air cells and middle ear cavities are clear. The globes are intact. Other: Small left frontal scalp hematoma. CT CERVICAL SPINE FINDINGS Alignment: The overall alignment is maintained. Stable degenerative subluxations and facet disease. Skull base and vertebrae: No acute fracture. No primary bone lesion or focal pathologic process. Soft tissues and spinal canal: No prevertebral fluid or swelling. No visible canal hematoma. Disc levels: The spinal canal is fairly generous. No large disc protrusions or canal stenosis. Stable mild multilevel bony foraminal stenosis due to uncinate spurring and facet disease. Upper chest: The lung apices are grossly clear. Other: Stable large right thyroid goiter with a 3.4 cm right thyroid lesion. This has been evaluated on previous imaging. (ref: J Am Coll Radiol. 2015 Feb;12(2): 143-50). IMPRESSION: 1. Stable age related cerebral atrophy, ventriculomegaly and periventricular white matter  disease. 2. No acute intracranial findings  or skull fracture. 3. Small left frontal scalp hematoma. 4. Stable degenerative cervical spondylosis with multilevel disc disease and facet disease but no acute cervical spine fracture. Electronically Signed   By: MYRTIS Stammer M.D.   On: 04/21/2024 10:14   Assessment/Plan Kiara Blair is a 89 y.o. female with medical history significant for T2DM, HTN, HLD, TIA, essential tremor, GERD, frequent falls c/b rib fractures, anxiety and neuropathy who presents back to the ED after a second fall in the last 2 days.   # Fall # Left 7th rib fracture  # T2DM - Last A1c *** - Blood glucose of *** on admission - Home regimen includes *** - *** - SSI with meals, CBG monitoring - Carb modified diet - F/u repeat A1c  # HTN  - Continue amlodipine  and benazepril   # HLD # TIA - Continue aspirin  and atorvastatin   # Essential tremor - Continue propranolol   # GAD - Continue Lexapro   #***  DVT prophylaxis: Lovenox      Code Status: Prior  Consults called: General Surgery  Family Communication: ***  Severity of Illness: {Observation/Inpatient:21159}  Level of care: Med-Surg   This record has been created using Conservation officer, historic buildings. Errors have been sought and corrected, but may not always be located. Such creation errors do not reflect on the standard of care.   Lou Claretta HERO, MD 04/22/2024, 11:10 PM [facility] Pager: [phone] Isaiah 41:10   If 7PM-7AM, please contact night-coverage [email or web address] Password [email or web address]

## 2024-04-22 NOTE — ED Triage Notes (Addendum)
 Pt BIB [facility] from [facility] for L shoulder pain radiating to L flank after fall yesterday. Pt seen at [facility] yesterday. Pt demented at baseline. No thinners. 140/74 80HR

## 2024-04-23 ENCOUNTER — Encounter (HOSPITAL_COMMUNITY): Payer: Self-pay | Admitting: Student

## 2024-04-23 ENCOUNTER — Inpatient Hospital Stay (HOSPITAL_COMMUNITY)

## 2024-04-23 DIAGNOSIS — M67912 Unspecified disorder of synovium and tendon, left shoulder: Secondary | ICD-10-CM

## 2024-04-23 DIAGNOSIS — D509 Iron deficiency anemia, unspecified: Secondary | ICD-10-CM

## 2024-04-23 DIAGNOSIS — S2232XA Fracture of one rib, left side, initial encounter for closed fracture: Secondary | ICD-10-CM | POA: Diagnosis not present

## 2024-04-23 DIAGNOSIS — E119 Type 2 diabetes mellitus without complications: Secondary | ICD-10-CM

## 2024-04-23 DIAGNOSIS — W19XXXA Unspecified fall, initial encounter: Secondary | ICD-10-CM | POA: Diagnosis not present

## 2024-04-23 LAB — BASIC METABOLIC PANEL WITH GFR
Anion gap: 9 (ref 5–15)
BUN: 30 mg/dL — ABNORMAL HIGH (ref 8–23)
CO2: 22 mmol/L (ref 22–32)
Calcium: 8.6 mg/dL — ABNORMAL LOW (ref 8.9–10.3)
Chloride: 100 mmol/L (ref 98–111)
Creatinine, Ser: 0.99 mg/dL (ref 0.44–1.00)
GFR, Estimated: 53 mL/min — ABNORMAL LOW (ref >60)
Glucose, Bld: 286 mg/dL — ABNORMAL HIGH (ref 70–99)
Potassium: 4 mmol/L (ref 3.5–5.1)
Sodium: 131 mmol/L — ABNORMAL LOW (ref 135–145)

## 2024-04-23 LAB — CBC
HCT: 31.4 % — ABNORMAL LOW (ref 36.0–46.0)
Hemoglobin: 9.9 g/dL — ABNORMAL LOW (ref 12.0–15.0)
MCH: 25.2 pg — ABNORMAL LOW (ref 26.0–34.0)
MCHC: 31.5 g/dL (ref 30.0–36.0)
MCV: 79.9 fL — ABNORMAL LOW (ref 80.0–100.0)
Platelets: 265 K/uL (ref 150–400)
RBC: 3.93 MIL/uL (ref 3.87–5.11)
RDW: 15.6 % — ABNORMAL HIGH (ref 11.5–15.5)
WBC: 8.8 K/uL (ref 4.0–10.5)
nRBC: 0 % (ref 0.0–0.2)

## 2024-04-23 LAB — GLUCOSE, CAPILLARY
Glucose-Capillary: 340 mg/dL — ABNORMAL HIGH (ref 70–99)
Glucose-Capillary: 226 mg/dL — ABNORMAL HIGH (ref 70–99)
Glucose-Capillary: 325 mg/dL — ABNORMAL HIGH (ref 70–99)
Glucose-Capillary: 81 mg/dL (ref 70–99)
Glucose-Capillary: 212 mg/dL — ABNORMAL HIGH (ref 70–99)

## 2024-04-23 LAB — VITAMIN D 25 HYDROXY (VIT D DEFICIENCY, FRACTURES): Vit D, 25-Hydroxy: 42.29 ng/mL (ref 30–100)

## 2024-04-23 MED ORDER — POLYVINYL ALCOHOL 1.4 % OP SOLN
1.0000 [drp] | Freq: Three times a day (TID) | OPHTHALMIC | Status: DC
  Administered 2024-04-23 – 2024-04-26 (×7): 1 [drp] via OPHTHALMIC
  Filled 2024-04-23: qty 15

## 2024-04-23 MED ORDER — ASPIRIN 81 MG PO TBEC
81.0000 mg | DELAYED_RELEASE_TABLET | Freq: Every day | ORAL | Status: DC
  Administered 2024-04-23 – 2024-04-25 (×3): 81 mg via ORAL
  Filled 2024-04-23 (×4): qty 1

## 2024-04-23 MED ORDER — BACITRACIN ZINC 500 UNIT/GM EX OINT
TOPICAL_OINTMENT | Freq: Two times a day (BID) | CUTANEOUS | Status: DC
  Administered 2024-04-23 – 2024-04-24 (×2): 1 via TOPICAL
  Filled 2024-04-23 (×4): qty 0.9

## 2024-04-23 MED ORDER — MORPHINE SULFATE (PF) 2 MG/ML IV SOLN
1.0000 mg | Freq: Once | INTRAVENOUS | Status: AC
  Administered 2024-04-23: 1 mg via INTRAVENOUS
  Filled 2024-04-23: qty 1

## 2024-04-23 MED ORDER — ADULT MULTIVITAMIN W/MINERALS CH
1.0000 | ORAL_TABLET | Freq: Two times a day (BID) | ORAL | Status: DC
  Administered 2024-04-23 – 2024-04-25 (×6): 1 via ORAL
  Filled 2024-04-23 (×7): qty 1

## 2024-04-23 MED ORDER — INSULIN ASPART 100 UNIT/ML IJ SOLN
5.0000 [IU] | Freq: Three times a day (TID) | INTRAMUSCULAR | Status: DC
  Administered 2024-04-23 (×2): 5 [IU] via SUBCUTANEOUS

## 2024-04-23 MED ORDER — ESCITALOPRAM OXALATE 10 MG PO TABS
5.0000 mg | ORAL_TABLET | Freq: Every day | ORAL | Status: DC
  Administered 2024-04-23 – 2024-04-25 (×3): 5 mg via ORAL
  Filled 2024-04-23 (×3): qty 1

## 2024-04-23 MED ORDER — ATORVASTATIN CALCIUM 40 MG PO TABS
40.0000 mg | ORAL_TABLET | Freq: Every evening | ORAL | Status: DC
  Administered 2024-04-23 – 2024-04-25 (×2): 40 mg via ORAL
  Filled 2024-04-23 (×3): qty 1

## 2024-04-23 MED ORDER — MECLIZINE HCL 25 MG PO TABS: 12.5000 mg | ORAL_TABLET | Freq: Three times a day (TID) | ORAL | Status: DC | PRN

## 2024-04-23 MED ORDER — CARBOXYMETHYLCELLULOSE SODIUM 0.25 % OP SOLN: Freq: Three times a day (TID) | OPHTHALMIC | Status: DC

## 2024-04-23 MED ORDER — INSULIN ASPART 100 UNIT/ML FLEXPEN: 5.0000 [IU] | PEN_INJECTOR | Freq: Three times a day (TID) | SUBCUTANEOUS | Status: DC

## 2024-04-23 MED ORDER — AMLODIPINE BESYLATE 5 MG PO TABS
5.0000 mg | ORAL_TABLET | Freq: Every day | ORAL | Status: DC
  Administered 2024-04-23 – 2024-04-25 (×3): 5 mg via ORAL
  Filled 2024-04-23 (×4): qty 1

## 2024-04-23 MED ORDER — INSULIN GLARGINE-YFGN 100 UNIT/ML ~~LOC~~ SOLN
10.0000 [IU] | Freq: Every day | SUBCUTANEOUS | Status: DC
  Administered 2024-04-23 – 2024-04-26 (×4): 10 [IU] via SUBCUTANEOUS
  Filled 2024-04-23 (×4): qty 0.1

## 2024-04-23 MED ORDER — SENNA 8.6 MG PO TABS
1.0000 | ORAL_TABLET | ORAL | Status: DC
  Administered 2024-04-24: 8.6 mg via ORAL
  Filled 2024-04-23 (×2): qty 1

## 2024-04-23 MED ORDER — OXYCODONE HCL 5 MG PO TABS
2.5000 mg | ORAL_TABLET | Freq: Four times a day (QID) | ORAL | Status: DC | PRN
  Administered 2024-04-23 – 2024-04-24 (×4): 2.5 mg via ORAL
  Filled 2024-04-23 (×5): qty 1

## 2024-04-23 MED ORDER — DICLOFENAC SODIUM 1 % EX GEL
2.0000 g | Freq: Two times a day (BID) | CUTANEOUS | Status: DC
  Administered 2024-04-23 – 2024-04-26 (×7): 2 g via TOPICAL
  Filled 2024-04-23: qty 100

## 2024-04-23 MED ORDER — PROPRANOLOL HCL 20 MG PO TABS
10.0000 mg | ORAL_TABLET | Freq: Two times a day (BID) | ORAL | Status: DC
  Administered 2024-04-23 – 2024-04-25 (×6): 10 mg via ORAL
  Filled 2024-04-23 (×7): qty 1

## 2024-04-23 MED ORDER — PREGABALIN 100 MG PO CAPS: 100.0000 mg | ORAL_CAPSULE | Freq: Two times a day (BID) | ORAL | Status: DC

## 2024-04-23 MED ORDER — MELATONIN 5 MG PO TABS
5.0000 mg | ORAL_TABLET | Freq: Every evening | ORAL | Status: AC | PRN
  Administered 2024-04-23 – 2024-04-25 (×3): 5 mg via ORAL
  Filled 2024-04-23 (×3): qty 1

## 2024-04-23 NOTE — Hospital Course (Addendum)
 89 year old woman presented after fall at home resulting in rib fracture.  Admitted for pain control and PT   Consultants General surgery

## 2024-04-23 NOTE — Plan of Care (Signed)
   Problem: Education: Goal: Knowledge of General Education information will improve Description: Including pain rating scale, medication(s)/side effects and non-pharmacologic comfort measures Outcome: Progressing   Problem: Clinical Measurements: Goal: Ability to maintain clinical measurements within normal limits will improve Outcome: Progressing   Problem: Elimination: Goal: Will not experience complications related to urinary retention Outcome: Progressing   Problem: Pain Managment: Goal: General experience of comfort will improve and/or be controlled Outcome: Progressing   Problem: Safety: Goal: Ability to remain free from injury will improve Outcome: Progressing

## 2024-04-23 NOTE — Consult Note (Addendum)
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 SOB. Multiple subacute left posterior rib fxs - see above Cervicalgia - 2 negative CT c-spines in the last 2 days with some chronic changes noted; however, she is tender to palpation over midline and with ROM testing.  She is not a good candidate for MRI given her AMS. We will obtain flex-ex films to rule out possible ligamentous injury.  If this is negative, she could have a soft collar as needed for comfort.  If positive, would need NSGY eval.   Right anterior shin abrasion - bacitracin   Left periorbital ecchymosis -CT head negative x 2 AMS - unclear if patient has some dementia or if this is related to age/admission/change in habits or if this may be related to concussion as she clearly has hit her head.   Will have TBI therapies assess patient. L shoulder pain - severe arthritis noted on x-ray.  No acute injury.  Pain control  Benign essential tremors DM HTN HLD TIAs GERD Anemia  - hgb 9.2 from 11.  monitor Frequent falls Large hiatal hernia noted on CT Thyroid mass noted on CT  Discussed patient with primary service.  Will follow up on Flex-ex films.  Pain seems well  controlled right now and patient has tylenol , low dose oxy (2.5mg ), and lidocaine  patch ordered.  Will order PT/OT/SLP for evals and assistance with mobilization.   FEN - carb mod VTE - lovenox  ID - none currently needed  I reviewed nursing notes, ED provider notes, hospitalist notes, last 24 h vitals and pain scores, last 48 h intake and output, last 24 h labs and trends, and last 24 h imaging results.  Burnard FORBES Banter, [facility] Surgery 04/23/2024, 9:27 AM Please see Amion for pager number during day hours 7:00am-4:30pm or 7:00am -11:30am on weekends

## 2024-04-23 NOTE — Progress Notes (Signed)
  Progress Note   Patient: Kiara Blair FMW:[id number] DOB: 05-May-1930 DOA: 04/22/2024     1 DOS: the patient was seen and examined on 04/23/2024   Brief hospital course: 89 year old woman presented after fall at home resulting in rib fracture.  Admitted for pain control and PT   Consultants General surgery      Assessment and Plan: Fall Left 7th rib fracture Trace left pneumothorax  Pt w/ history of frequent falls presented after her second floor in 2 days Chest imaging reveals acute displaced left seventh lateral rib fracture 11 mm displacement but no pneumothorax Management per general surgery.    Microcytic anemia Microcytosis. Hemoglobin decreased from earlier this month. Check CBC in AM  T2DM - Last A1c 8.3% 1 month ago CBG high.  Resume long-acting insulin .   Rotator cuff tendinopathy - Chronic problem with exacerbation due to recent fall - Continue Tylenol  and Voltaren  gel - PT/OT eval  HTN Stable Continue amlodipine    HLD PMH TIA Continue aspirin  and atorvastatin    Essential tremor - Continue propranolol    # GAD - Continue Lexapro   Aortic atherosclerosis  Enlarged right thyroid gland with question underlying hypodense nodule measuring up to 3 cm. This has been evaluated on previous imaging ultrasound thyroid neck 02/23/2020.    Subjective:  Left shoulder hurts terribly Hurts all over  Physical Exam: Vitals:   04/23/24 0439 04/23/24 0752 04/23/24 0928 04/23/24 1304  BP: (!) 128/59 (!) 160/80 (!) 174/83 (!) 149/57  Pulse: 62 (!) 108 70 78  Resp: 16  15 17   Temp: 98 F (36.7 C)   98.6 F (37 C)  TempSrc:    Oral  SpO2: 97% 92%  98%  Weight:      Height:       Physical Exam Vitals reviewed.  Constitutional:      General: She is not in acute distress.    Appearance: She is not ill-appearing or toxic-appearing.  Cardiovascular:     Rate and Rhythm: Normal rate and regular rhythm.     Heart sounds: No murmur heard. Pulmonary:     Effort:  Pulmonary effort is normal. No respiratory distress.     Breath sounds: No wheezing, rhonchi or rales.  Musculoskeletal:     Right lower leg: No edema.     Left lower leg: No edema.  Neurological:     Mental Status: She is alert.  Psychiatric:        Mood and Affect: Mood is anxious.        Behavior: Behavior normal. Behavior is cooperative.     Comments: Apepars confused     Data Reviewed: CBG high Na+ 131 Hgb down to 9.9  Family Communication: none  Disposition: Status is: Inpatient Remains inpatient appropriate because: rib fracture     Time spent: [age] year old woman presented after 89 year old woman presented after fall, found to have-year-old woman minutes  Author: Toribio Door, MD 04/23/2024 6:36 PM  For on call review [email or web address].

## 2024-04-23 NOTE — Progress Notes (Signed)
 Patient ID: Kiara Blair, female   DOB: [birth year], 89 y.o.   MRN: [id number] BP (!) 149/57 (BP Location: Right Arm)   Pulse 78   Temp 98.6 F (37 C) (Oral)   Resp 17   Ht 5' 3 (1.6 m)   Wt 60.5 kg   SpO2 98%   BMI 23.63 kg/m  Films reviewed, I do not agree with a 7mm anterolisthesis with flexion. I think that the movement is within normal movement. The left 3/4 facet is degenerated and certainly can explain the slip with flexion. There is absolutely no prevertebral swelling to suggest longitudinal embarrassment.  CT in a supine position showed good alignment in the facets. Does not need a cervical collar.

## 2024-04-23 NOTE — Progress Notes (Signed)
 Orthopedic Tech Progress Note Patient Details:  Kiara Blair [birth year] [id number]  Ortho Devices Type of Ortho Device: Soft collar Ortho Device/Splint Interventions: Ordered, Application, Adjustment   Post Interventions Patient Tolerated: Well Instructions Provided: Care of device, Adjustment of device  Waylan Thom Loving 04/23/2024, 6:14 PM

## 2024-04-23 NOTE — Evaluation (Signed)
 Physical Therapy Evaluation Patient Details Name: Kiara Blair MRN: [id number] DOB: 1930/07/02 Today's Date: 04/23/2024  History of Present Illness  Pt is 89 yo female admitted on 04/22/24 after repeated falls.  Pt found to have L 7th rib fx with small pneumothorax.  She had presented to ED the day prior after another fall with unremarkable workup.  Pt with hx including but not limited to T2DM, HTN, HLD, TIA, essential tremor, GERD, frequent falls  Clinical Impression  Pt admitted with above diagnosis. At baseline, pt from [facility].  She is ambulatory but does have hx of frequent falls and does not consistently use RW.  Per chart review pt with altered mental status baseline but did not note formal dx of dementia. Today, pt requiring CGA to min A for safety with transfers.  She ambulated 55' with mild instability.   Pt currently with functional limitations due to the deficits listed below (see PT Problem List). Pt will benefit from acute skilled PT to increase their independence and safety with mobility to allow discharge.  Noted in OT note that family would like return to ALF and planning on sitter with pt for supervision.  PT would also recommend ongoing HHPT due to hx of falls.        If plan is discharge home, recommend the following: A little help with walking and/or transfers;A little help with bathing/dressing/bathroom;Assistance with cooking/housework;Help with stairs or ramp for entrance   Can travel by private vehicle        Equipment Recommendations None recommended by PT  Recommendations for Other Services       Functional Status Assessment Patient has had a recent decline in their functional status and demonstrates the ability to make significant improvements in function in a reasonable and predictable amount of time.     Precautions / Restrictions Precautions Precautions: Fall      Mobility  Bed Mobility Overal bed mobility: Needs Assistance Bed Mobility: Sit to  Supine       Sit to supine: Min assist        Transfers Overall transfer level: Needs assistance Equipment used: Rolling walker (2 wheels) Transfers: Sit to/from Stand Sit to Stand: Contact guard assist           General transfer comment: Min A from bed and toilet; performed toileting ADLs with CGA for safety    Ambulation/Gait Ambulation/Gait assistance: Contact guard assist Gait Distance (Feet): 80 Feet Assistive device: Rolling walker (2 wheels) Gait Pattern/deviations: Step-through pattern, Decreased stride length, Shuffle Gait velocity: decreased     General Gait Details: Cues to keep RW close  Stairs            Wheelchair Mobility     Tilt Bed    Modified Rankin (Stroke Patients Only)       Balance Overall balance assessment: Needs assistance, History of Falls Sitting-balance support: No upper extremity supported, Feet supported Sitting balance-Leahy Scale: Good     Standing balance support: No upper extremity supported, Bilateral upper extremity supported Standing balance-Leahy Scale: Fair Standing balance comment: Hx of falls; ambulated with RW today; toileting adls without UE support but had CGA                             Pertinent Vitals/Pain Pain Assessment Pain Assessment: Faces Faces Pain Scale: Hurts even more Pain Location: L side (ribs) with transfers Pain Descriptors / Indicators: Discomfort, Grimacing, Crying Pain Intervention(s): Limited activity  within patient's tolerance, Monitored during session, Repositioned, Premedicated before session (falling asleep when back in bed)    Home Living Family/patient expects to be discharged to:: Assisted living                 Home Equipment: Shower seat - built in;Grab bars - tub/shower;Rolling Environmental consultant (2 wheels);Cane - single point;Wheelchair - manual;Grab bars - toilet Additional Comments: PLOF info obtained from pt's son [name] via phone, pt very confused and a poor  hisrorian, pt lives at ALF [facility] (since spring of 2024)    Prior Function Prior Level of Function : Needs assist             Mobility Comments: Has RW, but does not always use AD, but at times uses RW/furniture walks, sometimes amb to dining hall for meals, ditches RW at times, frequent falls ADLs Comments: Ind with ADLs, facility does meals, showers seated, needs Sup for showers     Extremity/Trunk Assessment   Upper Extremity Assessment Upper Extremity Assessment: Defer to OT evaluation;Generalized weakness;Difficult to assess due to impaired cognition    Lower Extremity Assessment Lower Extremity Assessment: Generalized weakness;Difficult to assess due to impaired cognition    Cervical / Trunk Assessment Cervical / Trunk Assessment: Kyphotic  Communication   Communication Communication: No apparent difficulties    Cognition Arousal: Alert Behavior During Therapy: WFL for tasks assessed/performed   PT - Cognitive impairments: No family/caregiver present to determine baseline, History of cognitive impairments                                 Cueing       General Comments General comments (skin integrity, edema, etc.): Pt with bruising L eye from fall; VSS    Exercises     Assessment/Plan    PT Assessment Patient needs continued PT services  PT Problem List Decreased strength;Decreased range of motion;Decreased cognition;Decreased activity tolerance;Decreased knowledge of use of DME;Decreased balance;Decreased safety awareness;Decreased mobility       PT Treatment Interventions DME instruction;Therapeutic exercise;Gait training;Functional mobility training;Therapeutic activities;Patient/family education;Balance training;Modalities    PT Goals (Current goals can be found in the Care Plan section)  Acute Rehab PT Goals Patient Stated Goal: decrease pain PT Goal Formulation: With patient Time For Goal Achievement: 05/07/24 Potential to  Achieve Goals: Good    Frequency Min 1X/week     Co-evaluation               AM-PAC PT 6 Clicks Mobility  Outcome Measure Help needed turning from your back to your side while in a flat bed without using bedrails?: A Little Help needed moving from lying on your back to sitting on the side of a flat bed without using bedrails?: A Little Help needed moving to and from a bed to a chair (including a wheelchair)?: A Little Help needed standing up from a chair using your arms (e.g., wheelchair or bedside chair)?: A Little Help needed to walk in hospital room?: A Little Help needed climbing 3-5 steps with a railing? : A Lot 6 Click Score: 17    End of Session Equipment Utilized During Treatment: Gait belt Activity Tolerance: Patient limited by pain Patient left: in bed;with call bell/phone within reach;with bed alarm set Nurse Communication: Mobility status PT Visit Diagnosis: Other abnormalities of gait and mobility (R26.89);Muscle weakness (generalized) (M62.81);History of falling (Z91.81)    Time: 8757-8743 PT Time Calculation (min) (ACUTE ONLY): 14  min   Charges:   PT Evaluation $PT Eval Low Complexity: 1 Low   PT General Charges $$ ACUTE PT VISIT: 1 Visit         Benjiman, PT Acute Rehab [facility] [phone]   Benjiman VEAR Mulberry 04/23/2024, 1:51 PM

## 2024-04-23 NOTE — Evaluation (Signed)
 Occupational Therapy Evaluation Patient Details Name: Kiara Blair MRN: [id number] DOB: 1929-11-10 Today's Date: 04/23/2024   History of Present Illness   Kiara Blair is a 89 y.o. female with medical history significant for T2DM, HTN, HLD, TIA, essential tremor, GERD, frequent falls c/b rib fractures, anxiety and neuropathy who presents back to the ED after a second fall within 2 days. Patient was evaluated at [facility] ED after a fall the day priro and discharged back to her facility after unremarkable workup. Patient reported she fell again in the afternoon while she was in the shower, falling on her left side. Per pt's  son, the facility did not take patient to the ER right away until patient started complaining of left shoulder, left flank and back pain in the evening. Reports that patient has had multiple falls in the past leading to rib fractures and has had intermittent dizziness     Clinical Impressions Pt presents with decline in function and safety with ADLs and ADL mobility with impaired strength, balance, endurance and cognition (hx of memory impairments). PLOF info obtained from pt's son [name] via phone, pt very confused and a poor historian; pt lives at ALF [facility] (since spring of 2024), Ind with ADLs, facility does meals, showers seated, needs Sup for showers, has RW, but does not always use AD, but at times uses RW/furniture walks, sometimes walking to dining hall for meals, ditches RW  at times per pt's son. Pt very pleasant, but confused, A & O to self. Pt currently required min A/CGA with LB ADLs, mobility using RW, CGA with grooming standing at sink and required cues throughout for safety ad hand placement. Recommend HH OT at d/c; pt's son inquired about how helpful a sitter for pt would be at ALF and OT provided education on how use of a sitter for safety can decrease risk of falls and injures. OT will follow acutely to maximize level of function and safety     If plan is  discharge home, recommend the following:   A little help with bathing/dressing/bathroom;A lot of help with walking and/or transfers;Supervision due to cognitive status;Direct supervision/assist for financial management;Assist for transportation;Help with stairs or ramp for entrance;Direct supervision/assist for medications management     Functional Status Assessment   Patient has had a recent decline in their functional status and demonstrates the ability to make significant improvements in function in a reasonable and predictable amount of time.     Equipment Recommendations   None recommended by OT     Recommendations for Other Services         Precautions/Restrictions   Precautions Precautions: Fall;Other (comment) Precaution/Restrictions Comments: hx pf multiple falls, memory impairments, hx of dizziness Restrictions Weight Bearing Restrictions Per Provider Order: No     Mobility Bed Mobility               General bed mobility comments: pt in chair upon OT arrival    Transfers Overall transfer level: Needs assistance Equipment used: Rolling walker (2 wheels) Transfers: Sit to/from Stand, Bed to chair/wheelchair/BSC Sit to Stand: Min assist, Contact guard assist           General transfer comment: min A initial stand from chair, CGA from commode, cues for correct hand placement and safety using RW      Balance Overall balance assessment: Needs assistance Sitting-balance support: No upper extremity supported, Feet supported Sitting balance-Leahy Scale: Good     Standing balance support: Single extremity supported, Bilateral upper extremity  supported, During functional activity Standing balance-Leahy Scale: Fair Standing balance comment: pt with hx of falls (loses balance per pt's son)                           ADL either performed or assessed with clinical judgement   ADL Overall ADL's : Needs assistance/impaired Eating/Feeding:  Independent   Grooming: Wash/dry hands;Wash/dry face;Oral care;Contact guard assist;Standing   Upper Body Bathing: Contact guard assist;Standing   Lower Body Bathing: Minimal assistance;Sit to/from stand   Upper Body Dressing : Contact guard assist;Standing   Lower Body Dressing: Minimal assistance;Sit to/from stand   Toilet Transfer: Minimal assistance;Contact guard assist;Ambulation;Rolling walker (2 wheels);Regular Toilet;BSC/3in1;Grab bars;Cueing for safety   Toileting- Clothing Manipulation and Hygiene: Minimal assistance;Sit to/from stand       Functional mobility during ADLs: Minimal assistance;Contact guard assist;Rolling walker (2 wheels);Cueing for safety General ADL Comments: assist for safety, impaired cognition and balance     Vision Baseline Vision/History: 1 Wears glasses Ability to See in Adequate Light: 0 Adequate Patient Visual Report: No change from baseline       Perception         Praxis         Pertinent Vitals/Pain Pain Assessment Pain Assessment: No/denies pain     Extremity/Trunk Assessment Upper Extremity Assessment Upper Extremity Assessment: Generalized weakness   Lower Extremity Assessment Lower Extremity Assessment: Defer to PT evaluation       Communication Communication Communication: No apparent difficulties   Cognition Arousal: Alert Behavior During Therapy: WFL for tasks assessed/performed Cognition: History of cognitive impairments             OT - Cognition Comments: pt's son [name] reports memory impairments and impaired safety awareness at baseline, forgets to use RW at times, multiple falls                 Following commands: Intact       Cueing  General Comments   Cueing Techniques: Verbal cues;Visual cues      Exercises     Shoulder Instructions      Home Living Family/patient expects to be discharged to:: Assisted living                             Home Equipment: Shower seat  - built in;Grab bars - tub/shower;Rolling Environmental consultant (2 wheels);Cane - single point;Wheelchair - manual;Grab bars - toilet   Additional Comments: PLOF info obtained from pt's son [name] via phone, pt very confused and a poor hisrorian, pt lives at ALF [facility] (since spring of 2024)      Prior Functioning/Environment Prior Level of Function : Needs assist             Mobility Comments: Has RW, but does not always use AD, but at times uses RW/furniture walks, sometimes amb to dining hall for meals, ditches RW ADLs Comments: Ind with ADLs, facility does meals, showers seated, needs Sup for showers    OT Problem List: Decreased strength;Impaired balance (sitting and/or standing);Decreased cognition;Decreased safety awareness;Decreased activity tolerance;Decreased knowledge of use of DME or AE   OT Treatment/Interventions:        OT Goals(Current goals can be found in the care plan section)   Acute Rehab OT Goals Patient Stated Goal: return to ALF per pt's son OT Goal Formulation: With patient/family Time For Goal Achievement: 05/07/24 Potential to Achieve Goals: Good ADL Goals Pt  Will Perform Grooming: with supervision;with set-up;standing Pt Will Perform Upper Body Bathing: with supervision;with set-up Pt Will Perform Lower Body Bathing: with contact guard assist;with supervision Pt Will Perform Upper Body Dressing: with supervision;with set-up Pt Will Perform Lower Body Dressing: with contact guard assist;with supervision Pt Will Transfer to Toilet: with supervision;ambulating Pt Will Perform Toileting - Clothing Manipulation and hygiene: with supervision;with modified independence Pt Will Perform Tub/Shower Transfer: with contact guard assist;with caregiver independent in assisting;ambulating;rolling walker;shower seat;grab bars   OT Frequency:  Min 2X/week    Co-evaluation              AM-PAC OT 6 Clicks Daily Activity     Outcome Measure Help from another person  eating meals?: None Help from another person taking care of personal grooming?: A Little Help from another person toileting, which includes using toliet, bedpan, or urinal?: A Little Help from another person bathing (including washing, rinsing, drying)?: A Little Help from another person to put on and taking off regular upper body clothing?: A Little Help from another person to put on and taking off regular lower body clothing?: A Little 6 Click Score: 19   End of Session Equipment Utilized During Treatment: Gait belt;Rolling walker (2 wheels) Nurse Communication: Mobility status  Activity Tolerance: Patient tolerated treatment well Patient left: in chair;with call bell/phone within reach;with chair alarm set  OT Visit Diagnosis: Unsteadiness on feet (R26.81);Other abnormalities of gait and mobility (R26.89);History of falling (Z91.81)                Time: 8973-8940 OT Time Calculation (min): 33 min Charges:  OT General Charges $OT Visit: 1 Visit OT Evaluation $OT Eval Low Complexity: 1 Low OT Treatments $Therapeutic Activity: 8-22 mins    Jacques Karna Loose 04/23/2024, 1:14 PM

## 2024-04-23 NOTE — Inpatient Diabetes Management (Signed)
 Inpatient Diabetes Program Recommendations  AACE/ADA: New Consensus Statement on Inpatient Glycemic Control (2015)  Target Ranges:  Prepandial:   less than 140 mg/dL      Peak postprandial:   less than 180 mg/dL (1-2 hours)      Critically ill patients:  140 - 180 mg/dL   Lab Results  Component Value Date   GLUCAP 226 (H) 04/23/2024   HGBA1C 8.3 (H) 02/25/2024    Review of Glycemic Control  Latest Reference Range & Units 04/23/24 01:13 04/23/24 07:19  Glucose-Capillary 70 - 99 mg/dL 659 (H) 773 (H)  (H): Data is abnormally high  Diabetes history: DM2 Outpatient Diabetes medications: Lantus  10 units every day, Novolog  5 units [facility] Current orders for Inpatient glycemic control: Novolog  0-15 units TID and 5 units TID [facility]  Inpatient Diabetes Program Recommendations:    If glucose remains > 200 mg/dL, might consider:  Semglee  5 units every day (50% of home dose).  Thank you, Wyvonna Pinal, MSN, CDCES Diabetes Coordinator Inpatient Diabetes Program [phone] (team pager from 8a-5p)

## 2024-04-24 DIAGNOSIS — S2232XA Fracture of one rib, left side, initial encounter for closed fracture: Secondary | ICD-10-CM | POA: Diagnosis not present

## 2024-04-24 DIAGNOSIS — W19XXXA Unspecified fall, initial encounter: Secondary | ICD-10-CM | POA: Diagnosis not present

## 2024-04-24 LAB — GLUCOSE, CAPILLARY
Glucose-Capillary: 254 mg/dL — ABNORMAL HIGH (ref 70–99)
Glucose-Capillary: 172 mg/dL — ABNORMAL HIGH (ref 70–99)
Glucose-Capillary: 193 mg/dL — ABNORMAL HIGH (ref 70–99)
Glucose-Capillary: 205 mg/dL — ABNORMAL HIGH (ref 70–99)
Glucose-Capillary: 214 mg/dL — ABNORMAL HIGH (ref 70–99)

## 2024-04-24 LAB — CBC
HCT: 35 % — ABNORMAL LOW (ref 36.0–46.0)
Hemoglobin: 11.2 g/dL — ABNORMAL LOW (ref 12.0–15.0)
MCH: 25.6 pg — ABNORMAL LOW (ref 26.0–34.0)
MCHC: 32 g/dL (ref 30.0–36.0)
MCV: 80.1 fL (ref 80.0–100.0)
Platelets: 261 K/uL (ref 150–400)
RBC: 4.37 MIL/uL (ref 3.87–5.11)
RDW: 15.8 % — ABNORMAL HIGH (ref 11.5–15.5)
WBC: 9.4 K/uL (ref 4.0–10.5)
nRBC: 0 % (ref 0.0–0.2)

## 2024-04-24 MED ORDER — OXYCODONE HCL 5 MG PO TABS
2.5000 mg | ORAL_TABLET | Freq: Four times a day (QID) | ORAL | Status: DC | PRN
  Administered 2024-04-24 – 2024-04-26 (×6): 5 mg via ORAL
  Filled 2024-04-24 (×6): qty 1

## 2024-04-24 MED ORDER — HALOPERIDOL LACTATE 5 MG/ML IJ SOLN
1.0000 mg | Freq: Four times a day (QID) | INTRAMUSCULAR | Status: DC | PRN
  Administered 2024-04-24 – 2024-04-26 (×3): 1 mg via INTRAVENOUS
  Filled 2024-04-24 (×3): qty 1

## 2024-04-24 MED ORDER — METHOCARBAMOL 500 MG PO TABS
500.0000 mg | ORAL_TABLET | Freq: Three times a day (TID) | ORAL | Status: DC | PRN
  Administered 2024-04-24 – 2024-04-26 (×4): 500 mg via ORAL
  Filled 2024-04-24 (×4): qty 1

## 2024-04-24 MED ORDER — MORPHINE SULFATE (PF) 2 MG/ML IV SOLN
1.0000 mg | Freq: Once | INTRAVENOUS | Status: AC
  Administered 2024-04-24: 1 mg via INTRAVENOUS
  Filled 2024-04-24: qty 1
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
## 2024-04-24 NOTE — Progress Notes (Signed)
 Occupational Therapy Treatment Patient Details Name: Kiara Blair MRN: [id number] DOB: Aug 15, 1930 Today's Date: 04/24/2024   History of present illness Pt is 89 yo female admitted on 04/22/24 after repeated falls.  Pt found to have L 7th rib fx with small pneumothorax.  She had presented to ED the day prior after another fall with unremarkable workup.  Pt with hx including but not limited to T2DM, HTN, HLD, TIA, essential tremor, GERD, frequent falls   OT comments  Patient seen for skilled OT session this am. Progressing well with all therapy presented including functional mobility in hallway and standing and seated ADL's with assist and cues as outlined below. Patient cooperative during session and able to redirect for falls prevention and consistent use of RW. Patient continues to require skilled OT within hospital setting with recommendations for return to ALF with increased supervision and support with [facility] services.       If plan is discharge home, recommend the following:  A little help with bathing/dressing/bathroom;A lot of help with walking and/or transfers;Supervision due to cognitive status;Direct supervision/assist for financial management;Assist for transportation;Help with stairs or ramp for entrance;Direct supervision/assist for medications management   Equipment Recommendations  None recommended by OT       Precautions / Restrictions Precautions Precautions: Fall Precaution/Restrictions Comments: hx pf multiple falls, memory impairments, hx of dizziness Restrictions Weight Bearing Restrictions Per Provider Order: No       Mobility Bed Mobility               General bed mobility comments: Pt up in chair and requests back to same    Transfers Overall transfer level: Needs assistance Equipment used: Rolling walker (2 wheels) Transfers: Sit to/from Stand Sit to Stand: Contact guard assist           General transfer comment: Steady assist with cues for use of  UEs to self assist     Balance Overall balance assessment: Needs assistance, History of Falls Sitting-balance support: No upper extremity supported, Feet supported Sitting balance-Leahy Scale: Good     Standing balance support: No upper extremity supported, Bilateral upper extremity supported Standing balance-Leahy Scale: Fair Standing balance comment: Hx of falls; ambulated with RW today                           ADL either performed or assessed with clinical judgement   ADL Overall ADL's : Needs assistance/impaired Eating/Feeding: Independent   Grooming: Wash/dry hands;Wash/dry face;Oral care;Sitting;Contact guard assist   Upper Body Bathing: Contact guard assist;Sitting;Cueing for safety;Cueing for sequencing   Lower Body Bathing: Contact guard assist;Sitting/lateral leans;Cueing for safety;Cueing for sequencing   Upper Body Dressing : Supervision/safety;Sitting   Lower Body Dressing: Contact guard assist;Sit to/from stand   Toilet Transfer: Contact guard assist;Rolling walker (2 wheels);Cueing for Chief of Staff Details (indicate cue type and reason): min cues for pacing Toileting- Clothing Manipulation and Hygiene: Contact guard assist;Sit to/from stand       Functional mobility during ADLs: Contact guard assist;Rolling walker (2 wheels);Cueing for safety General ADL Comments: cues for safety and falls prevention    Extremity/Trunk Assessment Upper Extremity Assessment Upper Extremity Assessment: Right hand dominant;Generalized weakness   Lower Extremity Assessment Lower Extremity Assessment: Defer to PT evaluation                 Communication Communication Communication: Impaired Factors Affecting Communication: Hearing impaired   Cognition Arousal: Alert Behavior During Therapy: [facility] for tasks assessed/performed, Impulsive  Cognition: History of cognitive impairments             OT - Cognition Comments: needs cues and assist for  impulsivity, safety, insight and judgement                 Following commands: Intact        Cueing   Cueing Techniques: Verbal cues, Visual cues        General Comments L eye bruising from fall, otherwise no pain or SOB tolerating all therapy well    Pertinent Vitals/ Pain       Pain Assessment Pain Assessment: No/denies pain  Home Living       Type of Home:  (ALF)                                      Frequency  Min 2X/week        Progress Toward Goals  OT Goals(current goals can now be found in the care plan section)  Progress towards OT goals: Progressing toward goals  Acute Rehab OT Goals Patient Stated Goal: to return to ALF OT Goal Formulation: With patient/family Time For Goal Achievement: 05/07/24 Potential to Achieve Goals: Good ADL Goals Pt Will Perform Grooming: with supervision;with set-up;standing Pt Will Perform Upper Body Bathing: with supervision;with set-up Pt Will Perform Lower Body Bathing: with contact guard assist;with supervision Pt Will Perform Upper Body Dressing: with supervision;with set-up Pt Will Perform Lower Body Dressing: with contact guard assist;with supervision Pt Will Transfer to Toilet: with supervision;ambulating Pt Will Perform Toileting - Clothing Manipulation and hygiene: with supervision;with modified independence Pt Will Perform Tub/Shower Transfer: with contact guard assist;with caregiver independent in assisting;ambulating;rolling walker;shower seat;grab bars  Plan      AM-PAC OT 6 Clicks Daily Activity     Outcome Measure   Help from another person eating meals?: None Help from another person taking care of personal grooming?: A Little Help from another person toileting, which includes using toliet, bedpan, or urinal?: A Little Help from another person bathing (including washing, rinsing, drying)?: A Little Help from another person to put on and taking off regular upper body clothing?: A  Little Help from another person to put on and taking off regular lower body clothing?: A Little 6 Click Score: 19    End of Session Equipment Utilized During Treatment: Gait belt;Rolling walker (2 wheels)  OT Visit Diagnosis: Unsteadiness on feet (R26.81);Other abnormalities of gait and mobility (R26.89);History of falling (Z91.81)   Activity Tolerance Patient tolerated treatment well   Patient Left in chair;with call bell/phone within reach;with chair alarm set   Nurse Communication Mobility status        Time: 8944-8887 OT Time Calculation (min): 17 min  Charges: OT General Charges $OT Visit: 1 Visit  Marvel Sapp OT/L Acute Rehabilitation Department  [phone]  04/24/2024, 12:04 PM

## 2024-04-24 NOTE — Progress Notes (Signed)
  Progress Note   Patient: Kiara Blair FMW:[id number] DOB: September 12, 1930 DOA: 04/22/2024     2 DOS: the patient was seen and examined on 04/24/2024   Brief hospital course: 89 year old woman presented after fall at home resulting in rib fracture.  Admitted for pain control and PT   Consultants General surgery       Assessment and Plan: Fall Left 7th rib fracture Trace left pneumothorax  Abnormal cervical films Pt w/ history of frequent falls presented after her second floor in 2 days Chest imaging reveals acute displaced left seventh lateral rib fracture 11 mm displacement but no pneumothorax Management per general surgery.    Acute delirium Likely related to acute condition, environment Telesitter Fall precautions No s/s of infection Hopefully can move towards discharge soon, when cleared by surgery; likely to do better outside of hospital   Microcytic anemia Microcytosis borderline.  Hgb stable, follow-up as an outpatient   T2DM - Last A1c 8.3% 1 month ago CBG high.  Resume long-acting insulin .   Rotator cuff tendinopathy - Chronic problem with exacerbation due to recent fall - Continue Tylenol  and Voltaren  gel - PT/OT eval   HTN Stable Continue amlodipine    HLD PMH TIA Continue aspirin  and atorvastatin    Essential tremor - Continue propranolol    # GAD - Continue Lexapro    Aortic atherosclerosis   Enlarged right thyroid gland with question underlying hypodense nodule measuring up to 3 cm. This has been evaluated on previous imaging ultrasound thyroid neck 02/23/2020.     Subjective:  Seems to feel ok Confused Has telesitter History unreliable  Physical Exam: Vitals:   04/23/24 2002 04/23/24 2349 04/24/24 0443 04/24/24 0800  BP: (!) 185/69 98/79 (!) 151/103 (!) 191/67  Pulse: 92 67 78 72  Resp: 18 17 18 15   Temp: 98.5 F (36.9 C) 98.2 F (36.8 C) 98.1 F (36.7 C)   TempSrc: Oral Oral Oral   SpO2: 95% 93% 98% 96%  Weight:      Height:        Physical Exam Vitals reviewed.  Constitutional:      General: She is not in acute distress.    Appearance: She is not ill-appearing or toxic-appearing.  Cardiovascular:     Rate and Rhythm: Normal rate and regular rhythm.     Heart sounds: No murmur heard. Pulmonary:     Effort: Pulmonary effort is normal. No respiratory distress.     Breath sounds: No wheezing, rhonchi or rales.  Skin:    Comments: Ecchymosis to face noted  Neurological:     Mental Status: She is alert.  Psychiatric:        Mood and Affect: Mood normal.        Behavior: Behavior normal.     Data Reviewed: CBG stable  Family Communication: none  Disposition: Status is: Inpatient Remains inpatient appropriate because: s/p trauma, rib fx     Time spent: 35 minutes  Author: Toribio Door, MD 04/24/2024 10:34 AM  For on call review [email or web address].

## 2024-04-24 NOTE — Progress Notes (Signed)
 EOS:   Pt alert & oriented to self only, very forgetful, constantly asking why her side hurt - states she does not remember falling - pt not easily reoriented.  On call [facility] contacted re: severe agitation episode, pt attempting to leave bed, screaming -  walked in room with patient, used bathroom, pt unable to reorient and continued to be agitated - one time pain medication intervention see emar - effective.  Pt throughout shift constantly attempted to leave bed, difficult to reorient.  0500: updated [facility], pt received oxy 2.5mg  at 0150, not effective at this time, constantly attempting to roll over side of bed stating that she is in pain, unable to reorient patient or reposition her to comfort

## 2024-04-24 NOTE — Plan of Care (Signed)

## 2024-04-24 NOTE — Progress Notes (Signed)
 Physical Therapy Treatment Patient Details Name: Kiara Blair MRN: [id number] DOB: [birth year] Today's Date: 04/24/2024   History of Present Illness Pt is 89 yo female admitted on 04/22/24 after repeated falls.  Pt found to have L 7th rib fx with small pneumothorax.  She had presented to ED the day prior after another fall with unremarkable workup.  Pt with hx including but not limited to T2DM, HTN, HLD, TIA, essential tremor, GERD, frequent falls    PT Comments  Pt very cooperative but mildly impulsive and with questionable safety awareness.  Pt up to ambulate increased distance in hall with RW, CGA for safety and cues to decrease pace for safety.  Pt with reports of no pain this am.   If plan is discharge home, recommend the following: A little help with walking and/or transfers;A little help with bathing/dressing/bathroom;Assistance with cooking/housework;Help with stairs or ramp for entrance   Can travel by private vehicle        Equipment Recommendations  None recommended by PT    Recommendations for Other Services       Precautions / Restrictions Precautions Precautions: Fall Precaution/Restrictions Comments: hx pf multiple falls, memory impairments, hx of dizziness Restrictions Weight Bearing Restrictions Per Provider Order: No     Mobility  Bed Mobility               General bed mobility comments: Pt up in chair and requests back to same    Transfers Overall transfer level: Needs assistance Equipment used: Rolling walker (2 wheels) Transfers: Sit to/from Stand Sit to Stand: Contact guard assist           General transfer comment: Steady assist with cues for use of UEs to self assist    Ambulation/Gait Ambulation/Gait assistance: Contact guard assist Gait Distance (Feet): 150 Feet Assistive device: Rolling walker (2 wheels) Gait Pattern/deviations: Step-through pattern, Decreased stride length, Shuffle       General Gait Details: cues for position  from RW and to slow pace for safety   Stairs             Wheelchair Mobility     Tilt Bed    Modified Rankin (Stroke Patients Only)       Balance Overall balance assessment: Needs assistance, History of Falls Sitting-balance support: No upper extremity supported, Feet supported Sitting balance-Leahy Scale: Good     Standing balance support: No upper extremity supported, Bilateral upper extremity supported Standing balance-Leahy Scale: Fair Standing balance comment: Hx of falls; ambulated with RW today                            Communication Communication Communication: Impaired Factors Affecting Communication: Hearing impaired  Cognition Arousal: Alert Behavior During Therapy: WFL for tasks assessed/performed, Impulsive   PT - Cognitive impairments: No family/caregiver present to determine baseline, History of cognitive impairments                         Following commands: Intact      Cueing Cueing Techniques: Verbal cues, Visual cues  Exercises      General Comments        Pertinent Vitals/Pain Pain Assessment Pain Assessment: No/denies pain    Home Living       Type of Home:  (ALF)                  Prior Function  PT Goals (current goals can now be found in the care plan section) Acute Rehab PT Goals Patient Stated Goal: No specific goals expressed.  Pt agreeable to participate with PT PT Goal Formulation: With patient Time For Goal Achievement: 05/07/24 Potential to Achieve Goals: Good Progress towards PT goals: Progressing toward goals    Frequency    Min 1X/week      PT Plan      Co-evaluation              AM-PAC PT 6 Clicks Mobility   Outcome Measure  Help needed turning from your back to your side while in a flat bed without using bedrails?: A Little Help needed moving from lying on your back to sitting on the side of a flat bed without using bedrails?: A Little Help  needed moving to and from a bed to a chair (including a wheelchair)?: A Little Help needed standing up from a chair using your arms (e.g., wheelchair or bedside chair)?: A Little Help needed to walk in hospital room?: A Little Help needed climbing 3-5 steps with a railing? : A Lot 6 Click Score: 17    End of Session Equipment Utilized During Treatment: Gait belt Activity Tolerance: Patient tolerated treatment well Patient left: in chair;with call bell/phone within reach;with chair alarm set Nurse Communication: Mobility status PT Visit Diagnosis: Other abnormalities of gait and mobility (R26.89);Muscle weakness (generalized) (M62.81);History of falling (Z91.81)     Time: 8941-8885 PT Time Calculation (min) (ACUTE ONLY): 16 min  Charges:    $Gait Training: 8-22 mins PT General Charges $$ ACUTE PT VISIT: 1 Visit                     [facility] PT Acute Rehabilitation Services Office [phone]    Kiara Blair 04/24/2024, 11:18 AM

## 2024-04-24 NOTE — Progress Notes (Signed)
 Subjective: Patient still very confused today.  Sitting up in her chair very comfortable appearing now.  RN states that she has been having somewhat uncontrolled pain otherwise at times.  She pulled 900 on her IS for me.  Denies any pain in her neck today.   Objective: Vital signs in last 24 hours: Temp:  [98.1 F (36.7 C)-98.6 F (37 C)] 98.1 F (36.7 C) (07/17 0443) Pulse Rate:  [67-92] 72 (07/17 0800) Resp:  [15-18] 15 (07/17 0800) BP: (98-191)/(57-103) 191/67 (07/17 0800) SpO2:  [93 %-98 %] 96 % (07/17 0800)    Intake/Output from previous day: 07/16 0701 - 07/17 0700 In: -  Out: 500 [Urine:500] Intake/Output this shift: Total I/O In: 240 [P.O.:240] Out: -   PE: Gen: NAD, sitting up in bed HEENT: ecchymosis of left face fading.  No pain to palpation of her neck today or with ROM Heart: regular Lungs: CTAB, moving good air Abd: soft Ext: abrasion to right anterior shin stable Psych: Alert and oriented to self only.  Lab Results:  Recent Labs    04/23/24 0333 04/24/24 0338  WBC 8.8 9.4  HGB 9.9* 11.2*  HCT 31.4* 35.0*  PLT 265 261   BMET Recent Labs    04/22/24 2208 04/23/24 0333  NA 133* 131*  K 4.5 4.0  CL 101 100  CO2 22 22  GLUCOSE 382* 286*  BUN 31* 30*  CREATININE 1.05* 0.99  CALCIUM  9.3 8.6*   PT/INR No results for input(s): LABPROT, INR in the last 72 hours. CMP     Component Value Date/Time   NA 131 (L) 04/23/2024 0333   K 4.0 04/23/2024 0333   CL 100 04/23/2024 0333   CO2 22 04/23/2024 0333   GLUCOSE 286 (H) 04/23/2024 0333   BUN 30 (H) 04/23/2024 0333   CREATININE 0.99 04/23/2024 0333   CALCIUM  8.6 (L) 04/23/2024 0333   PROT 6.6 04/13/2024 1902   ALBUMIN 3.4 (L) 04/13/2024 1902   AST 31 04/13/2024 1902   ALT 19 04/13/2024 1902   ALKPHOS 141 (H) 04/13/2024 1902   BILITOT 0.4 04/13/2024 1902   GFRNONAA 53 (L) 04/23/2024 0333   GFRAA >60 05/01/2020 0609   Lipase     Component Value Date/Time   LIPASE 27  02/24/2024 1700       Studies/Results: DG Cerv Spine Flex&Ext Only Result Date: 04/23/2024 CLINICAL DATA:  Neck pain. EXAM: CERVICAL SPINE - FLEXION AND EXTENSION VIEWS ONLY COMPARISON:  CT cervical spine 04/22/2024. FINDINGS: The cervical spine is visualized from the occiput to the cervicothoracic junction. Very minimal grade 1 anterolisthesis of C3 on C4, most likely degenerative in etiology. Alignment is otherwise anatomic. Prevertebral soft tissues are within normal limits. Vertebral body height is maintained. Osteopenia. Multilevel endplate degenerative changes with uncovertebral and facet hypertrophy. Loss of disc space height at C2-3, C4-5 and C5-6. There is slight anterior motion at C3-4 with flexion, measuring up to 4 mm, compared to proximally 1-2 mm with neutral positioning. It does reduce completely with extension. IMPRESSION: 1. Grade 1 anterolisthesis of C3 on C4 with slight pathologic motion with flexion and extension. 2. Multilevel degenerative disc disease, as detailed above. 3. Osteopenia. Electronically Signed   By: Newell Eke M.D.   On: 04/23/2024 14:42   DG CHEST PORT 1 VIEW Result Date: 04/23/2024 CLINICAL DATA:  Rib fractures. EXAM: PORTABLE CHEST 1 VIEW COMPARISON:  04/13/2024 and CT chest 04/22/2024. FINDINGS: Trachea is midline. Heart is enlarged. Thoracic aorta is  calcified. Large hiatal hernia. No airspace consolidation or pleural fluid. Mildly displaced left sixth rib fracture, as on 04/22/2024. Degenerative changes in the shoulders with high riding humeral heads bilaterally, indicative of chronic rotator cuff tears. Degenerative changes in the spine. IMPRESSION: 1. Mildly displaced acute left sixth posterolateral rib fracture. 2. Otherwise, no acute findings. 3. Large hiatal hernia. Electronically Signed   By: Newell Eke M.D.   On: 04/23/2024 10:27   CT Head Wo Contrast Result Date: 04/22/2024 CLINICAL DATA:  Head trauma, minor (Age >= 65y); Neck trauma (Age >=  65y) EXAM: CT HEAD WITHOUT CONTRAST CT CERVICAL SPINE WITHOUT CONTRAST TECHNIQUE: Multidetector CT imaging of the head and cervical spine was performed following the standard protocol without intravenous contrast. Multiplanar CT image reconstructions of the cervical spine were also generated. RADIATION DOSE REDUCTION: This exam was performed according to the departmental dose-optimization program which includes automated exposure control, adjustment of the mA and/or kV according to patient size and/or use of iterative reconstruction technique. COMPARISON:  Ultrasound thyroid neck 02/23/2020, CT head and C-spine 04/21/2024 FINDINGS: CT HEAD FINDINGS Brain: Patchy and confluent areas of decreased attenuation are noted throughout the deep and periventricular white matter of the cerebral hemispheres bilaterally, compatible with chronic microvascular ischemic disease. No evidence of large-territorial acute infarction. No parenchymal hemorrhage. No mass lesion. No extra-axial collection. No mass effect or midline shift. No hydrocephalus. Basilar cisterns are patent. Vascular: No hyperdense vessel. Atherosclerotic calcifications are present within the cavernous internal carotid and vertebral arteries. Skull: No acute fracture or focal lesion. Sinuses/Orbits: Left frontal sinus mucosal thickening. Otherwise paranasal sinuses and mastoid air cells are clear. Bilateral lens replacement. Otherwise the orbits are unremarkable. Other: None. CT CERVICAL SPINE FINDINGS Alignment: Normal. Skull base and vertebrae: Multilevel moderate degenerative changes spine. No associated severe osseous neural foraminal or central canal stenosis. No acute fracture. No aggressive appearing focal osseous lesion or focal pathologic process. Soft tissues and spinal canal: No prevertebral fluid or swelling. No visible canal hematoma. Upper chest: Trace left pneumothorax. Other: Enlarged right thyroid gland with question underlying hypodense nodule  measuring up to 3 cm. IMPRESSION: 1. Trace left pneumothorax. Please see separately dictated CT chest 04/22/2024. 2. No acute intracranial abnormality. 3. No acute displaced fracture or traumatic listhesis of the cervical spine. 4. Enlarged right thyroid gland with question underlying hypodense nodule measuring up to 3 cm. This has been evaluated on previous imaging ultrasound thyroid neck 02/23/2020. (ref: J Am Coll Radiol. 2015 Feb;12(2): 143-50). Electronically Signed   By: Morgane  Naveau M.D.   On: 04/22/2024 21:53   CT Cervical Spine Wo Contrast Result Date: 04/22/2024 CLINICAL DATA:  Head trauma, minor (Age >= 65y); Neck trauma (Age >= 65y) EXAM: CT HEAD WITHOUT CONTRAST CT CERVICAL SPINE WITHOUT CONTRAST TECHNIQUE: Multidetector CT imaging of the head and cervical spine was performed following the standard protocol without intravenous contrast. Multiplanar CT image reconstructions of the cervical spine were also generated. RADIATION DOSE REDUCTION: This exam was performed according to the departmental dose-optimization program which includes automated exposure control, adjustment of the mA and/or kV according to patient size and/or use of iterative reconstruction technique. COMPARISON:  Ultrasound thyroid neck 02/23/2020, CT head and C-spine 04/21/2024 FINDINGS: CT HEAD FINDINGS Brain: Patchy and confluent areas of decreased attenuation are noted throughout the deep and periventricular white matter of the cerebral hemispheres bilaterally, compatible with chronic microvascular ischemic disease. No evidence of large-territorial acute infarction. No parenchymal hemorrhage. No mass lesion. No extra-axial collection. No mass effect or  midline shift. No hydrocephalus. Basilar cisterns are patent. Vascular: No hyperdense vessel. Atherosclerotic calcifications are present within the cavernous internal carotid and vertebral arteries. Skull: No acute fracture or focal lesion. Sinuses/Orbits: Left frontal sinus  mucosal thickening. Otherwise paranasal sinuses and mastoid air cells are clear. Bilateral lens replacement. Otherwise the orbits are unremarkable. Other: None. CT CERVICAL SPINE FINDINGS Alignment: Normal. Skull base and vertebrae: Multilevel moderate degenerative changes spine. No associated severe osseous neural foraminal or central canal stenosis. No acute fracture. No aggressive appearing focal osseous lesion or focal pathologic process. Soft tissues and spinal canal: No prevertebral fluid or swelling. No visible canal hematoma. Upper chest: Trace left pneumothorax. Other: Enlarged right thyroid gland with question underlying hypodense nodule measuring up to 3 cm. IMPRESSION: 1. Trace left pneumothorax. Please see separately dictated CT chest 04/22/2024. 2. No acute intracranial abnormality. 3. No acute displaced fracture or traumatic listhesis of the cervical spine. 4. Enlarged right thyroid gland with question underlying hypodense nodule measuring up to 3 cm. This has been evaluated on previous imaging ultrasound thyroid neck 02/23/2020. (ref: J Am Coll Radiol. 2015 Feb;12(2): 143-50). Electronically Signed   By: Morgane  Naveau M.D.   On: 04/22/2024 21:53   CT CHEST ABDOMEN PELVIS WO CONTRAST Result Date: 04/22/2024 CLINICAL DATA:  Clemens yesterday. Left shoulder and left chest and leg pain. EXAM: CT CHEST, ABDOMEN AND PELVIS WITHOUT CONTRAST TECHNIQUE: Multidetector CT imaging of the chest, abdomen and pelvis was performed following the standard protocol without IV contrast. RADIATION DOSE REDUCTION: This exam was performed according to the departmental dose-optimization program which includes automated exposure control, adjustment of the mA and/or kV according to patient size and/or use of iterative reconstruction technique. COMPARISON:  CT scan 04/13/2024 FINDINGS: CT CHEST FINDINGS Cardiovascular: The heart is within normal limits in size for age. No pericardial effusion. Stable tortuosity and  calcification of the thoracic aorta and stable three-vessel coronary artery calcifications. Mediastinum/Nodes: No mediastinal or hilar mass or lymphadenopathy. 3.0 x 2.6 cm right thyroid lesion is unchanged. In the setting of significant comorbidities or limited life expectancy, no follow-up recommended (ref: J Am Coll Radiol. 2015 Feb;12(2): 143-50).The esophagus is stable. Large hiatal hernia. Lungs/Pleura: Patchy inflammations/edema and small left pleural effusion with left lower lobe atelectasis. No pneumothorax. Musculoskeletal: There is a displaced left seventh lateral rib fracture with maximum displacement 11 mm. No associated pneumothorax. There is small pleural hematoma and adjacent atelectasis or contusion. There are also numerous posterior left rib fractures which are subacute/healing. The sternum and thoracic vertebral bodies are intact. CT ABDOMEN PELVIS FINDINGS Hepatobiliary: No hepatic lesions are identified without contrast. No evidence of acute hepatic injury. No perihepatic fluid collections. The gallbladder is surgically absent. No common bile duct dilatation. Pancreas: Stable pancreatic atrophy but no mass or inflammation. Spleen: No acute splenic injury or perisplenic fluid collection. Adrenals/Urinary Tract: Adrenal glands and kidneys unremarkable and stable. The bladder is. Stomach/Bowel: Large hiatal hernia. Otherwise the stomach is unremarkable. The small bowel and colon are grossly normal without oral contrast. No obstructive findings. Severe sigmoid colon diverticulosis without findings for acute diverticulitis. Vascular/Lymphatic: Stable aortic and branch vessel calcifications but no aneurysm. Reproductive: Surgically absent. The uterus is surgically absent. The left ovary is still present and appears normal. I do not see the right ovary for certain but no adnexal mass. Other: No pelvic mass or adenopathy. No free pelvic fluid collections. No inguinal mass or adenopathy. No abdominal  wall hernia or subcutaneous lesions. Musculoskeletal: No acute lumbar spine fracture. The  bony pelvis is intact. Both hips are normally located. No hip fracture. IMPRESSION: 1. New/acute displaced left seventh lateral rib fracture with maximum displacement 11 mm. No associated pneumothorax. 2. Numerous posterior left rib fractures which are subacute/healing. 3. Patchy inflammation/edema and small left pleural effusion with left lower lobe atelectasis. 4. No acute abdominal/pelvic findings, mass lesions or adenopathy. 5. Large hiatal hernia. 6. Severe sigmoid colon diverticulosis without findings for acute diverticulitis. 7. Aortic atherosclerosis. Aortic Atherosclerosis (ICD10-I70.0). Electronically Signed   By: MYRTIS Stammer M.D.   On: 04/22/2024 21:50   DG Shoulder Left Result Date: 04/22/2024 CLINICAL DATA:  Shoulder pain after fall EXAM: LEFT SHOULDER - 2+ VIEW COMPARISON:  None Available. FINDINGS: There is no acute fracture or dislocation. There is severe glenohumeral and subacromial joint space narrowing sclerosis and osteophyte formation. No acute fracture or dislocation. There is soft tissue calcifications adjacent to the greater tuberosity likely related to calcific tendinitis. The bones are diffusely osteopenic. IMPRESSION: 1. No acute fracture or dislocation. 2. Severe glenohumeral and subacromial degenerative changes. Findings compatible with rotator cuff tendinopathy. 3. Calcific tendinitis. Electronically Signed   By: Greig Pique M.D.   On: 04/22/2024 21:31    Anti-infectives: Anti-infectives (From admission, onward)    None        Assessment/Plan Multiple GLFs Acute Left 7th displaced rib fx with small PTX - repeat CXR 7/16 with no PTX.  Pain control and pulm toilet with IS.  On RA with no SOB. Increase oxy to 2.5-5mg  to help with pain control. Add robaxin  500mg  q 8hrs prn, on scheduled tylenol  and lidocaine  patch already. Multiple subacute left posterior rib fxs - see  above Cervicalgia/grade 1 anterolisthesis - appreciate NSGY eval.  No intervention needed.  No collar needed. Right anterior shin abrasion - bacitracin  BID Left periorbital ecchymosis -CT head negative x 2 AMS - unclear if patient has some dementia or if this is related to age/admission/change in habits or if this may be related to concussion as she clearly has hit her head.   Will have TBI therapies assess patient.  PT/OT seen, SLP pending L shoulder pain - severe arthritis noted on x-ray.  No acute injury.  Pain control Benign essential tremors DM HTN HLD TIAs GERD Anemia  - hgb 11.2 Frequent falls Large hiatal hernia noted on CT Thyroid mass noted on CT  FEN - carb mod VTE - lovenox  ID - none currently needed  I reviewed Consultant NSGY notes, hospitalist notes, last 24 h vitals and pain scores, last 48 h intake and output, last 24 h labs and trends, and last 24 h imaging results.   LOS: 2 days    Burnard FORBES Banter , [facility] 04/24/2024, 10:44 AM Please see Amion for pager number during day hours 7:00am-4:30pm or 7:00am -11:30am on weekends

## 2024-04-24 NOTE — TOC Initial Note (Addendum)
 Transition of Care [facility]) - Initial/Assessment Note    Patient Details  Name: Kiara Blair MRN: [id number] Date of Birth: [birth year]  Transition of Care [facility]) CM/SW Contact:    Kiara ASPEN, LCSW Phone Number: 04/24/2024, 10:41 AM  Clinical Narrative:                  ADDENDUM @ 1552: Reached out to speak with Kiara Blair at [facility] this afternoon but she had left for today.  Informed that facility still needs to speak with family about pt's return to clarify any additional support needs.  Will alert TOC covering tomorrow.  Have spoken with pt's son, Kiara Blair and with [facility] RN, Kiara Blair, to review anticipated dc needs/ return to ALF.  Pt is oriented to self only. Very lethargic today and unable to engage with staff.  Per beside RN, pt agitated in the night and anticipating need for sitter.    Have confirmed with son and facility that pt is a resident in the ALF area at [facility] and on the waitlist to move into Memory care wing.  Pt has had prior falls at facility  - usually because she does not use her RW as she needs.  Son reports that he is looking into arranging private duty aide but concerned that she is still likely to fall/ be at risk to fall when the aide isn't there.  Discussed with son and RN at facility that we need to determine best/ safest dc plan and that her mobility may be close to baseline now.  I will fax over therapy notes for facility to review and have asked son and facility to please discuss what needs to be put in place for pt to be able to return to [facility] when she is medically cleared to do so.  Will follow back up with all involved this afternoon.     Expected Discharge Plan: Assisted Living Barriers to Discharge: Continued Medical Work up   Patient Goals and CMS Choice Patient states their goals for this hospitalization and ongoing recovery are:: family hopeful pt can return to ALF at [facility]          Expected Discharge Plan and  Services In-house Referral: Clinical Social Work     Living arrangements for the past 2 months: Assisted Living Facility ([facility])                                      Prior Living Arrangements/Services Living arrangements for the past 2 months: Assisted Living Facility ([facility]) Lives with:: Facility Resident Patient language and need for interpreter reviewed:: Yes Do you feel safe going back to the place where you live?: Yes      Need for Family Participation in Patient Care: No (Comment) Care giver support system in place?: Yes (comment)   Criminal Activity/Legal Involvement Pertinent to Current Situation/Hospitalization: No - Comment as needed  Activities of Daily Living   ADL Screening (condition at time of admission) Independently performs ADLs?: No Does the patient have a NEW difficulty with bathing/dressing/toileting/self-feeding that is expected to last >3 days?: Yes (Initiates electronic notice to provider for possible OT consult) Does the patient have a NEW difficulty with getting in/out of bed, walking, or climbing stairs that is expected to last >3 days?: Yes (Initiates electronic notice to provider for possible PT consult) Does the patient have a NEW difficulty with  communication that is expected to last >3 days?: No Is the patient deaf or have difficulty hearing?: Yes Does the patient have difficulty seeing, even when wearing glasses/contacts?: No Does the patient have difficulty concentrating, remembering, or making decisions?: Yes  Permission Sought/Granted Permission sought to share information with : Facility Medical sales representative, Family Supports    Share Information with NAME: son, Kiara Blair @ [phone]  Permission granted to share info w AGENCY: [facility]        Emotional Assessment Appearance:: Appears stated age Attitude/Demeanor/Rapport: Lethargic Affect (typically observed): Unable to Assess Orientation: : Oriented to  Self Alcohol  / Substance Use: Not Applicable Psych Involvement: No (comment)  Admission diagnosis:  Fall [W19.XXXA] Closed fracture of one rib of left side, initial encounter [S22.32XA] Patient Active Problem List   Diagnosis Date Noted   Tendinopathy of left rotator cuff 04/23/2024   Altered mental status 02/24/2024   Lower urinary tract infectious disease 02/24/2024   Fall 02/24/2024   Rib fracture 02/24/2024   Chronic health problem 02/24/2024   Abdominal pain 02/24/2024   Acute metabolic encephalopathy 11/29/2022   Stroke-like symptoms 11/27/2022   Generalized anxiety disorder 03/22/2022   History of CVA (cerebrovascular accident) 01/04/2021   Essential hypertension 01/03/2021   T2DM (type 2 diabetes mellitus) (HCC) 01/03/2021   Paresthesia 05/01/2020   Nuclear sclerotic cataract of left eye 03/29/2018   Sensorineural hearing loss (SNHL) of both ears 03/13/2017   Gastroesophageal reflux disease 06/26/2016   Diabetic polyneuropathy associated with type 2 diabetes mellitus (HCC) 01/10/2016   Essential tremor 10/22/2015   Hyperlipidemia LDL goal <100 10/22/2015   PCP:  Kiara Tinnie BRAVO, PA Pharmacy:   [facility] [phone] - [place], [place] - [address] [place] [address] Phone: [phone] Fax: [phone]  [facility] [place] [facility] [address] [place] [address] Phone: [phone] Fax: [phone]  [facility] [address] [place] [address] Phone: [phone] Fax: [phone]     Social Drivers of Health (SDOH) Social History: SDOH Screenings   Food Insecurity: No Food Insecurity (04/23/2024)  Housing: Low Risk  (04/23/2024)  Transportation Needs: No Transportation Needs (04/23/2024)  Utilities: Not At Risk (04/23/2024)  Financial Resource Strain: Low Risk  (01/02/2024)   Received from [facility]  Physical Activity: Sufficiently Active (06/15/2020)    Received from [facility]  Social Connections: Socially Isolated (04/23/2024)  Stress: Stress Concern Present (06/15/2020)   Received from [facility]  Tobacco Use: Low Risk  (04/23/2024)   SDOH Interventions:     Readmission Risk Interventions    04/24/2024   10:30 AM  Readmission Risk Prevention Plan  Transportation Screening Complete  PCP or Specialist Appt within 3-5 Days Complete  HRI or Home Care Consult Complete  Social Work Consult for Recovery Care Planning/Counseling Complete  Palliative Care Screening Not Applicable  Medication Review Oceanographer) Complete

## 2024-04-25 DIAGNOSIS — R410 Disorientation, unspecified: Secondary | ICD-10-CM

## 2024-04-25 DIAGNOSIS — S2232XA Fracture of one rib, left side, initial encounter for closed fracture: Secondary | ICD-10-CM | POA: Diagnosis not present

## 2024-04-25 DIAGNOSIS — W19XXXA Unspecified fall, initial encounter: Secondary | ICD-10-CM | POA: Diagnosis not present

## 2024-04-25 LAB — GLUCOSE, CAPILLARY
Glucose-Capillary: 248 mg/dL — ABNORMAL HIGH (ref 70–99)
Glucose-Capillary: 198 mg/dL — ABNORMAL HIGH (ref 70–99)
Glucose-Capillary: 309 mg/dL — ABNORMAL HIGH (ref 70–99)

## 2024-04-25 NOTE — Progress Notes (Signed)
 PHYSICAL THERAPY  Pt has amb several times with nursing staff around the unit [facility].  Pt plans to return to her ALF at [facility]. Have asked our Mobility Specialist to see as well.  Katheryn Leap  PTA Acute  Rehabilitation Services Office M-F          [phone]

## 2024-04-25 NOTE — TOC Progression Note (Signed)
 Transition of Care [facility]) - Progression Note    Patient Details  Name: Kiara Blair MRN: [id number] Date of Birth: 05-Oct-1930  [facility] CM/SW Contact  Sonda Manuella Quill, RN Phone Number: 04/25/2024, 10:25 AM  Clinical Narrative:    Notified that pt's son [name] would like return call to discuss her returning to [facility]; he was notified that facility will require new FL2 and D/C summary reviewed prior  returning; also explained pt is currently in AL not memory care; he was encouraged to speak w/ [facility] regarding her return; he was also encouraged to Google search for facilities, and visit [email or web address]; he verbalized understanding; TOC is following.   Expected Discharge Plan: Assisted Living Barriers to Discharge: Continued Medical Work up  Expected Discharge Plan and Services In-house Referral: Clinical Social Work     Living arrangements for the past 2 months: Assisted Living Facility ([facility])                                       Social Determinants of Health (SDOH) Interventions SDOH Screenings   Food Insecurity: No Food Insecurity (04/23/2024)  Housing: Low Risk  (04/23/2024)  Transportation Needs: No Transportation Needs (04/23/2024)  Utilities: Not At Risk (04/23/2024)  Financial Resource Strain: Low Risk  (01/02/2024)   Received from [facility]  Physical Activity: Sufficiently Active (06/15/2020)   Received from [facility]  Social Connections: Socially Isolated (04/23/2024)  Stress: Stress Concern Present (06/15/2020)   Received from [facility]  Tobacco Use: Low Risk  (04/23/2024)    Readmission Risk Interventions    04/24/2024   10:30 AM  Readmission Risk Prevention Plan  Transportation Screening Complete  PCP or Specialist Appt within 3-5 Days Complete  HRI or Home Care Consult Complete  Social Work Consult for Recovery Care Planning/Counseling Complete  Palliative Care Screening Not Applicable  Medication  Review Oceanographer) Complete

## 2024-04-25 NOTE — Plan of Care (Signed)
  Problem: Pain Managment: Goal: General experience of comfort will improve and/or be controlled Outcome: Progressing   Problem: Safety: Goal: Ability to remain free from injury will improve Outcome: Progressing   Problem: Skin Integrity: Goal: Risk for impaired skin integrity will decrease Outcome: Progressing   Problem: Safety: Goal: Non-violent Restraint(s) Outcome: Progressing

## 2024-04-25 NOTE — Plan of Care (Signed)
 Problem: Coping: Goal: Ability to adjust to condition or change in health will improve Outcome: Progressing   Problem: Nutritional: Goal: Maintenance of adequate nutrition will improve Outcome: Progressing   Problem: Clinical Measurements: Goal: Ability to maintain clinical measurements within normal limits will improve Outcome: Progressing   Problem: Activity: Goal: Risk for activity intolerance will decrease Outcome: Progressing   Problem: Safety: Goal: Ability to remain free from injury will improve Outcome: Progressing   Jon LULLA Reins, RN 04/25/24 8:03 PM

## 2024-04-25 NOTE — Progress Notes (Signed)
 Subjective: Confused, but comfortable, denies pain currently.  Just finished mobilizing with her tech and her walker.   Objective: Vital signs in last 24 hours: Temp:  [97.1 F (36.2 C)-98 F (36.7 C)] 97.1 F (36.2 C) (07/18 0642) Pulse Rate:  [64-88] 87 (07/18 0642) Resp:  [15-20] 19 (07/18 0642) BP: (159-175)/(62-113) 160/69 (07/18 0642) SpO2:  [95 %-97 %] 95 % (07/18 0642) Last BM Date :  (UTA)  Intake/Output from previous day: 07/17 0701 - 07/18 0700 In: 840 [P.O.:840] Out: -  Intake/Output this shift: No intake/output data recorded.  PE: Gen: NAD, just returned to her chair from ambulating HEENT: ecchymosis of left face fading.   Heart: regular Lungs: CTAB, moving good air Abd: soft Ext: abrasion to right anterior shin stable Psych: Alert and oriented to self only.  Lab Results:  Recent Labs    04/23/24 0333 04/24/24 0338  WBC 8.8 9.4  HGB 9.9* 11.2*  HCT 31.4* 35.0*  PLT 265 261   BMET Recent Labs    04/22/24 2208 04/23/24 0333  NA 133* 131*  K 4.5 4.0  CL 101 100  CO2 22 22  GLUCOSE 382* 286*  BUN 31* 30*  CREATININE 1.05* 0.99  CALCIUM  9.3 8.6*   PT/INR No results for input(s): LABPROT, INR in the last 72 hours. CMP     Component Value Date/Time   NA 131 (L) 04/23/2024 0333   K 4.0 04/23/2024 0333   CL 100 04/23/2024 0333   CO2 22 04/23/2024 0333   GLUCOSE 286 (H) 04/23/2024 0333   BUN 30 (H) 04/23/2024 0333   CREATININE 0.99 04/23/2024 0333   CALCIUM  8.6 (L) 04/23/2024 0333   PROT 6.6 04/13/2024 1902   ALBUMIN 3.4 (L) 04/13/2024 1902   AST 31 04/13/2024 1902   ALT 19 04/13/2024 1902   ALKPHOS 141 (H) 04/13/2024 1902   BILITOT 0.4 04/13/2024 1902   GFRNONAA 53 (L) 04/23/2024 0333   GFRAA >60 05/01/2020 0609   Lipase     Component Value Date/Time   LIPASE 27 02/24/2024 1700       Studies/Results: DG Cerv Spine Flex&Ext Only Result Date: 04/23/2024 CLINICAL DATA:  Neck pain. EXAM: CERVICAL SPINE - FLEXION  AND EXTENSION VIEWS ONLY COMPARISON:  CT cervical spine 04/22/2024. FINDINGS: The cervical spine is visualized from the occiput to the cervicothoracic junction. Very minimal grade 1 anterolisthesis of C3 on C4, most likely degenerative in etiology. Alignment is otherwise anatomic. Prevertebral soft tissues are within normal limits. Vertebral body height is maintained. Osteopenia. Multilevel endplate degenerative changes with uncovertebral and facet hypertrophy. Loss of disc space height at C2-3, C4-5 and C5-6. There is slight anterior motion at C3-4 with flexion, measuring up to 4 mm, compared to proximally 1-2 mm with neutral positioning. It does reduce completely with extension. IMPRESSION: 1. Grade 1 anterolisthesis of C3 on C4 with slight pathologic motion with flexion and extension. 2. Multilevel degenerative disc disease, as detailed above. 3. Osteopenia. Electronically Signed   By: Newell Eke M.D.   On: 04/23/2024 14:42    Anti-infectives: Anti-infectives (From admission, onward)    None        Assessment/Plan Multiple GLFs Acute Left 7th displaced rib fx with small PTX - repeat CXR 7/16 with no PTX.  Pain control and pulm toilet with IS.  On RA with no SOB. Oxy 2.5-5mg  to help with pain control. Add robaxin  500mg  q 8hrs prn, on scheduled tylenol  and lidocaine  patch already. Multiple subacute  left posterior rib fxs - see above Cervicalgia/grade 1 anterolisthesis - appreciate NSGY eval.  No intervention needed.  No collar needed. Right anterior shin abrasion - bacitracin  BID Left periorbital ecchymosis -CT head negative x 2 AMS - likely has some baseline underlying dementia on top of possible acute encephalopathy  L shoulder pain - severe arthritis noted on x-ray.  No acute injury.  Pain control Benign essential tremors DM HTN HLD TIAs GERD Anemia  - hgb 11.2 Frequent falls Large hiatal hernia noted on CT Thyroid mass noted on CT  FEN - carb mod VTE - lovenox  ID - none  currently needed Dispo - stable from a trauma standpoint.  Pain seems well controlled with no further needs.  D/w primary service.  Will sign off at this time.  I reviewed hospitalist notes, last 24 h vitals and pain scores, last 48 h intake and output, last 24 h labs and trends, and last 24 h imaging results.   LOS: 3 days    Burnard FORBES Banter , [facility] 04/25/2024, 9:49 AM Please see Amion for pager number during day hours 7:00am-4:30pm or 7:00am -11:30am on weekends

## 2024-04-25 NOTE — Progress Notes (Addendum)
  Progress Note   Patient: Kiara Blair FMW:[id number] DOB: 06-Feb-1930 DOA: 04/22/2024     3 DOS: the patient was seen and examined on 04/25/2024   Brief hospital course: 89 year old woman presented after fall at home resulting in rib fracture.  Admitted for pain control and PT   Consultants General surgery     Assessment and Plan: Fall Left 7th rib fracture Trace left pneumothorax  Abnormal cervical films Pt w/ history of frequent falls presented after her second floor in 2 days Chest imaging reveaed acute displaced left seventh lateral rib fracture 11 mm displacement   Management per general surgery--now clear for discharge per [facility].   Acute delirium Likely related to acute condition, environment Telesitter Fall precautions No s/s of infection Hopefully can move towards discharge tomorrow, if can be kept safe without restraints   Microcytic anemia Microcytosis borderline.  Hgb stable, follow-up as an outpatient   Diabetes mellitus type 2 with hyperglycemia  HgbA1c 8.3% 1 month ago CBG stable, continue long-acting insulin    Rotator cuff tendinopathy Chronic problem with exacerbation due to recent fall Continue Tylenol  and Voltaren  gel PT/OT eval   HTN Stable Continue amlodipine    HLD PMH TIA Continue aspirin  and atorvastatin    Essential tremor Continue propranolol    GAD Continue Lexapro    Aortic atherosclerosis   Enlarged right thyroid gland with question underlying hypodense nodule measuring up to 3 cm. This has been evaluated on previous imaging ultrasound thyroid neck 02/23/2020.       Subjective:  Seems to feel ok Confused, and history is unreliable  Per RN patient has been ambulated and is improving.  Physical Exam: Vitals:   04/24/24 1351 04/24/24 1724 04/24/24 2015 04/25/24 0642  BP: (!) 175/113 (!) 159/75 (!) 162/62 (!) 160/69  Pulse: 64 88 86 87  Resp: 15 19 20 19   Temp: 97.6 F (36.4 C) 98 F (36.7 C) 98 F (36.7 C) (!) 97.1 F (36.2  C)  TempSrc:    Oral  SpO2: 96% 97% 95% 95%  Weight:      Height:       Physical Exam Vitals reviewed.  Constitutional:      General: She is not in acute distress.    Appearance: She is not ill-appearing or toxic-appearing.  Cardiovascular:     Rate and Rhythm: Normal rate and regular rhythm.     Heart sounds: No murmur heard. Pulmonary:     Effort: Pulmonary effort is normal. No respiratory distress.     Breath sounds: No wheezing, rhonchi or rales.  Neurological:     Mental Status: She is alert.  Psychiatric:        Mood and Affect: Mood normal.        Behavior: Behavior normal.     Data Reviewed: CBG stable  Family Communication: none  Disposition: Status is: Inpatient Remains inpatient appropriate because: plan to return to current facility after 24 hours without restraings     Time spent: 20 minutes  Author: Toribio Door, MD 04/25/2024 11:20 AM  For on call review [email or web address].

## 2024-04-26 DIAGNOSIS — W19XXXA Unspecified fall, initial encounter: Secondary | ICD-10-CM | POA: Diagnosis not present

## 2024-04-26 DIAGNOSIS — S2232XA Fracture of one rib, left side, initial encounter for closed fracture: Secondary | ICD-10-CM | POA: Diagnosis not present

## 2024-04-26 LAB — GLUCOSE, CAPILLARY
Glucose-Capillary: 216 mg/dL — ABNORMAL HIGH (ref 70–99)
Glucose-Capillary: 219 mg/dL — ABNORMAL HIGH (ref 70–99)

## 2024-04-26 MED ORDER — QUETIAPINE FUMARATE 25 MG PO TABS
12.5000 mg | ORAL_TABLET | Freq: Once | ORAL | Status: DC
  Filled 2024-04-26: qty 1

## 2024-04-26 MED ORDER — METHOCARBAMOL 500 MG PO TABS: 500.0000 mg | ORAL_TABLET | Freq: Three times a day (TID) | ORAL | 0 refills | Status: DC | PRN

## 2024-04-26 MED ORDER — OXYCODONE HCL 5 MG PO TABS: 2.5000 mg | ORAL_TABLET | Freq: Three times a day (TID) | ORAL | 0 refills | Status: DC | PRN

## 2024-04-26 NOTE — Progress Notes (Addendum)
 [facility] here to get the patient. Granddaughter took all the patient belongings to the facility.ahead of transport. Telesitter discontinued. Patient left the unit with [facility] at 2030.

## 2024-04-26 NOTE — Discharge Summary (Signed)
 Physician Discharge Summary   Patient: Kiara Blair MRN: [id number] DOB: 10-Jun-1930  Admit date:     04/22/2024  Discharge date: 04/26/24  Discharge Physician: Toribio Door   PCP: Neysa Tinnie BRAVO, PA   Recommendations at discharge:   Rotator cuff tendinopathy Home health PT.    Enlarged right thyroid gland with question underlying hypodense nodule measuring up to 3 cm. This has been evaluated on previous imaging ultrasound thyroid neck 02/23/2020.  Discharge Diagnoses: Principal Problem:   Fall Active Problems:   Tendinopathy of left rotator cuff Left 7th rib fracture Trace left pneumothorax  Acute delirium Microcytic anemia Diabetes mellitus type 2 with hyperglycemia  Rotator cuff tendinopathy Essential tremor GAD Aortic atherosclerosis  Hospital Course: 89 year old woman presented after fall at home resulting in rib fracture.  Admitted for pain control and PT.  Followed by general surgery with gradual clinical improvement.  Hospitalization complicated by mild delirium.  Discharged in stable condition.   Consultants General surgery     Fall Left 7th rib fracture Trace left pneumothorax  Abnormal cervical films Pt w/ history of frequent falls presented after her second floor in 2 days Chest imaging reveaed acute displaced left seventh lateral rib fracture 11 mm displacement.  Pneumothorax resolved. Management per general surgery--clear for discharge per [facility].   Acute delirium Likely related to acute condition, environment Fall precautions No s/s of infection Seems somewhat improved.  Likely to do better in home environment.   Microcytic anemia Microcytosis borderline.  Hgb stable, follow-up as an outpatient   Diabetes mellitus type 2 with hyperglycemia  HgbA1c 8.3% 1 month ago CBG stable, continue long-acting insulin    Rotator cuff tendinopathy Chronic problem with exacerbation due to recent fall Continue Tylenol  and Voltaren  gel Home health PT.    HTN Stable Continue amlodipine    HLD PMH TIA Continue aspirin  and atorvastatin    Essential tremor Continue propranolol    GAD Continue Lexapro    Aortic atherosclerosis   Enlarged right thyroid gland with question underlying hypodense nodule measuring up to 3 cm. This has been evaluated on previous imaging ultrasound thyroid neck 02/23/2020.  Pain control - [place]  Controlled Substance Reporting System database was reviewed.    Disposition: Home health, ALF Diet recommendation:  Regular diet DISCHARGE MEDICATION: Allergies as of 04/26/2024       Reactions   Doxycycline Hyclate Nausea And Vomiting   Levofloxacin Nausea And Vomiting   Meloxicam Nausea And Vomiting   Naproxen-esomeprazole Mg Nausea And Vomiting   Nitrofurantoin Nausea And Vomiting   Other    Pt states she is allergic to meds but does not know names   Pneumococcal Polysaccharide Vaccine Itching        Medication List     STOP taking these medications    amLODipine -benazepril  5-20 MG capsule Commonly known as: LOTREL       TAKE these medications    acetaminophen  325 MG tablet Commonly known as: Tylenol  Take 2 tablets (650 mg total) by mouth every 6 (six) hours as needed. What changed: reasons to take this   amLODipine  5 MG tablet Commonly known as: NORVASC  Take 5 mg by mouth daily.   aspirin  EC 81 MG tablet Take 1 tablet (81 mg total) by mouth daily. Swallow whole.   atorvastatin  40 MG tablet Commonly known as: LIPITOR Take 1 tablet (40 mg total) by mouth daily.   escitalopram  5 MG tablet Commonly known as: LEXAPRO  Take 5 mg by mouth daily.   hydrOXYzine  25 MG capsule Commonly known as: VISTARIL   Take 25 mg by mouth 3 (three) times daily.   insulin  aspart 100 UNIT/ML FlexPen Commonly known as: NOVOLOG  Inject 5 Units into the skin 3 (three) times daily with meals. HOLD IF BS<100 OR IF THE RESIDENT IS NOT EATING   Lantus  SoloStar 100 UNIT/ML Solostar Pen Generic drug:  insulin  glargine Inject 10 Units into the skin at bedtime.   lidocaine  5 % Commonly known as: Lidoderm  Place 1 patch onto the skin daily. Remove & Discard patch within 12 hours or as directed by MD   meclizine  12.5 MG tablet Commonly known as: ANTIVERT  Take 12.5 mg by mouth 3 (three) times daily as needed for dizziness or nausea.   methocarbamol  500 MG tablet Commonly known as: ROBAXIN  Take 1 tablet (500 mg total) by mouth every 8 (eight) hours as needed for muscle spasms.   multivitamin with minerals Tabs tablet Take 1 tablet by mouth in the morning and at bedtime. Dr. Whitakers Vitamins.   oxyCODONE  5 MG immediate release tablet Commonly known as: Oxy IR/ROXICODONE  Take 0.5-1 tablets (2.5-5 mg total) by mouth every 8 (eight) hours as needed for severe pain (pain score 7-10).   pregabalin  100 MG capsule Commonly known as: Lyrica  Take 1 capsule (100 mg total) by mouth 2 (two) times daily.   propranolol  10 MG tablet Commonly known as: INDERAL  Take 10 mg by mouth 2 (two) times daily.   Senna-Tabs 8.6 MG tablet Generic drug: senna Take 1 tablet by mouth every other day.   THERATEARS OP Apply 1 drop to eye 3 (three) times daily.   Vitamin D  (Ergocalciferol ) 1.25 MG (50000 UNIT) Caps capsule Commonly known as: DRISDOL Take 50,000 Units by mouth once a week.   Vitamin D3 50 MCG (2000 UT) Tabs Take 1 tablet by mouth daily.   Voltaren  1 % Gel Generic drug: diclofenac  Sodium Apply 2 g topically 2 (two) times daily.        Follow-up Information     Neysa Tinnie BRAVO, PA. Schedule an appointment as soon as possible for a visit in 1 week(s).   Specialty: Physician Assistant Contact information: [address] [place] [address] [phone]                Discharge Exam: Kiara Blair   04/22/24 1946 04/23/24 0028  Weight: 58.5 kg 60.5 kg   Physical Exam Vitals reviewed.  Constitutional:      General: She is not in acute distress.    Appearance: She  is not ill-appearing or toxic-appearing.  Cardiovascular:     Rate and Rhythm: Normal rate and regular rhythm.     Heart sounds: No murmur heard. Pulmonary:     Effort: Pulmonary effort is normal. No respiratory distress.     Breath sounds: No wheezing, rhonchi or rales.  Neurological:     Mental Status: She is alert.  Psychiatric:        Mood and Affect: Mood normal.        Behavior: Behavior normal.      Condition at discharge: good  The results of significant diagnostics from this hospitalization (including imaging, microbiology, ancillary and laboratory) are listed below for reference.   Imaging Studies: DG Cerv Spine Flex&Ext Only Result Date: 04/23/2024 CLINICAL DATA:  Neck pain. EXAM: CERVICAL SPINE - FLEXION AND EXTENSION VIEWS ONLY COMPARISON:  CT cervical spine 04/22/2024. FINDINGS: The cervical spine is visualized from the occiput to the cervicothoracic junction. Very minimal grade 1 anterolisthesis of C3 on C4, most likely degenerative  in etiology. Alignment is otherwise anatomic. Prevertebral soft tissues are within normal limits. Vertebral body height is maintained. Osteopenia. Multilevel endplate degenerative changes with uncovertebral and facet hypertrophy. Loss of disc space height at C2-3, C4-5 and C5-6. There is slight anterior motion at C3-4 with flexion, measuring up to 4 mm, compared to proximally 1-2 mm with neutral positioning. It does reduce completely with extension. IMPRESSION: 1. Grade 1 anterolisthesis of C3 on C4 with slight pathologic motion with flexion and extension. 2. Multilevel degenerative disc disease, as detailed above. 3. Osteopenia. Electronically Signed   By: Newell Eke M.D.   On: 04/23/2024 14:42   DG CHEST PORT 1 VIEW Result Date: 04/23/2024 CLINICAL DATA:  Rib fractures. EXAM: PORTABLE CHEST 1 VIEW COMPARISON:  04/13/2024 and CT chest 04/22/2024. FINDINGS: Trachea is midline. Heart is enlarged. Thoracic aorta is calcified. Large hiatal  hernia. No airspace consolidation or pleural fluid. Mildly displaced left sixth rib fracture, as on 04/22/2024. Degenerative changes in the shoulders with high riding humeral heads bilaterally, indicative of chronic rotator cuff tears. Degenerative changes in the spine. IMPRESSION: 1. Mildly displaced acute left sixth posterolateral rib fracture. 2. Otherwise, no acute findings. 3. Large hiatal hernia. Electronically Signed   By: Newell Eke M.D.   On: 04/23/2024 10:27   CT Head Wo Contrast Result Date: 04/22/2024 CLINICAL DATA:  Head trauma, minor (Age >= 65y); Neck trauma (Age >= 65y) EXAM: CT HEAD WITHOUT CONTRAST CT CERVICAL SPINE WITHOUT CONTRAST TECHNIQUE: Multidetector CT imaging of the head and cervical spine was performed following the standard protocol without intravenous contrast. Multiplanar CT image reconstructions of the cervical spine were also generated. RADIATION DOSE REDUCTION: This exam was performed according to the departmental dose-optimization program which includes automated exposure control, adjustment of the mA and/or kV according to patient size and/or use of iterative reconstruction technique. COMPARISON:  Ultrasound thyroid neck 02/23/2020, CT head and C-spine 04/21/2024 FINDINGS: CT HEAD FINDINGS Brain: Patchy and confluent areas of decreased attenuation are noted throughout the deep and periventricular white matter of the cerebral hemispheres bilaterally, compatible with chronic microvascular ischemic disease. No evidence of large-territorial acute infarction. No parenchymal hemorrhage. No mass lesion. No extra-axial collection. No mass effect or midline shift. No hydrocephalus. Basilar cisterns are patent. Vascular: No hyperdense vessel. Atherosclerotic calcifications are present within the cavernous internal carotid and vertebral arteries. Skull: No acute fracture or focal lesion. Sinuses/Orbits: Left frontal sinus mucosal thickening. Otherwise paranasal sinuses and mastoid  air cells are clear. Bilateral lens replacement. Otherwise the orbits are unremarkable. Other: None. CT CERVICAL SPINE FINDINGS Alignment: Normal. Skull base and vertebrae: Multilevel moderate degenerative changes spine. No associated severe osseous neural foraminal or central canal stenosis. No acute fracture. No aggressive appearing focal osseous lesion or focal pathologic process. Soft tissues and spinal canal: No prevertebral fluid or swelling. No visible canal hematoma. Upper chest: Trace left pneumothorax. Other: Enlarged right thyroid gland with question underlying hypodense nodule measuring up to 3 cm. IMPRESSION: 1. Trace left pneumothorax. Please see separately dictated CT chest 04/22/2024. 2. No acute intracranial abnormality. 3. No acute displaced fracture or traumatic listhesis of the cervical spine. 4. Enlarged right thyroid gland with question underlying hypodense nodule measuring up to 3 cm. This has been evaluated on previous imaging ultrasound thyroid neck 02/23/2020. (ref: J Am Coll Radiol. 2015 Feb;12(2): 143-50). Electronically Signed   By: Morgane  Naveau M.D.   On: 04/22/2024 21:53   CT Cervical Spine Wo Contrast Result Date: 04/22/2024 CLINICAL DATA:  Head trauma, minor (  Age >= 65y); Neck trauma (Age >= 65y) EXAM: CT HEAD WITHOUT CONTRAST CT CERVICAL SPINE WITHOUT CONTRAST TECHNIQUE: Multidetector CT imaging of the head and cervical spine was performed following the standard protocol without intravenous contrast. Multiplanar CT image reconstructions of the cervical spine were also generated. RADIATION DOSE REDUCTION: This exam was performed according to the departmental dose-optimization program which includes automated exposure control, adjustment of the mA and/or kV according to patient size and/or use of iterative reconstruction technique. COMPARISON:  Ultrasound thyroid neck 02/23/2020, CT head and C-spine 04/21/2024 FINDINGS: CT HEAD FINDINGS Brain: Patchy and confluent areas of  decreased attenuation are noted throughout the deep and periventricular white matter of the cerebral hemispheres bilaterally, compatible with chronic microvascular ischemic disease. No evidence of large-territorial acute infarction. No parenchymal hemorrhage. No mass lesion. No extra-axial collection. No mass effect or midline shift. No hydrocephalus. Basilar cisterns are patent. Vascular: No hyperdense vessel. Atherosclerotic calcifications are present within the cavernous internal carotid and vertebral arteries. Skull: No acute fracture or focal lesion. Sinuses/Orbits: Left frontal sinus mucosal thickening. Otherwise paranasal sinuses and mastoid air cells are clear. Bilateral lens replacement. Otherwise the orbits are unremarkable. Other: None. CT CERVICAL SPINE FINDINGS Alignment: Normal. Skull base and vertebrae: Multilevel moderate degenerative changes spine. No associated severe osseous neural foraminal or central canal stenosis. No acute fracture. No aggressive appearing focal osseous lesion or focal pathologic process. Soft tissues and spinal canal: No prevertebral fluid or swelling. No visible canal hematoma. Upper chest: Trace left pneumothorax. Other: Enlarged right thyroid gland with question underlying hypodense nodule measuring up to 3 cm. IMPRESSION: 1. Trace left pneumothorax. Please see separately dictated CT chest 04/22/2024. 2. No acute intracranial abnormality. 3. No acute displaced fracture or traumatic listhesis of the cervical spine. 4. Enlarged right thyroid gland with question underlying hypodense nodule measuring up to 3 cm. This has been evaluated on previous imaging ultrasound thyroid neck 02/23/2020. (ref: J Am Coll Radiol. 2015 Feb;12(2): 143-50). Electronically Signed   By: Morgane  Naveau M.D.   On: 04/22/2024 21:53   CT CHEST ABDOMEN PELVIS WO CONTRAST Result Date: 04/22/2024 CLINICAL DATA:  Clemens yesterday. Left shoulder and left chest and leg pain. EXAM: CT CHEST, ABDOMEN AND  PELVIS WITHOUT CONTRAST TECHNIQUE: Multidetector CT imaging of the chest, abdomen and pelvis was performed following the standard protocol without IV contrast. RADIATION DOSE REDUCTION: This exam was performed according to the departmental dose-optimization program which includes automated exposure control, adjustment of the mA and/or kV according to patient size and/or use of iterative reconstruction technique. COMPARISON:  CT scan 04/13/2024 FINDINGS: CT CHEST FINDINGS Cardiovascular: The heart is within normal limits in size for age. No pericardial effusion. Stable tortuosity and calcification of the thoracic aorta and stable three-vessel coronary artery calcifications. Mediastinum/Nodes: No mediastinal or hilar mass or lymphadenopathy. 3.0 x 2.6 cm right thyroid lesion is unchanged. In the setting of significant comorbidities or limited life expectancy, no follow-up recommended (ref: J Am Coll Radiol. 2015 Feb;12(2): 143-50).The esophagus is stable. Large hiatal hernia. Lungs/Pleura: Patchy inflammations/edema and small left pleural effusion with left lower lobe atelectasis. No pneumothorax. Musculoskeletal: There is a displaced left seventh lateral rib fracture with maximum displacement 11 mm. No associated pneumothorax. There is small pleural hematoma and adjacent atelectasis or contusion. There are also numerous posterior left rib fractures which are subacute/healing. The sternum and thoracic vertebral bodies are intact. CT ABDOMEN PELVIS FINDINGS Hepatobiliary: No hepatic lesions are identified without contrast. No evidence of acute hepatic injury. No perihepatic fluid  collections. The gallbladder is surgically absent. No common bile duct dilatation. Pancreas: Stable pancreatic atrophy but no mass or inflammation. Spleen: No acute splenic injury or perisplenic fluid collection. Adrenals/Urinary Tract: Adrenal glands and kidneys unremarkable and stable. The bladder is. Stomach/Bowel: Large hiatal hernia.  Otherwise the stomach is unremarkable. The small bowel and colon are grossly normal without oral contrast. No obstructive findings. Severe sigmoid colon diverticulosis without findings for acute diverticulitis. Vascular/Lymphatic: Stable aortic and branch vessel calcifications but no aneurysm. Reproductive: Surgically absent. The uterus is surgically absent. The left ovary is still present and appears normal. I do not see the right ovary for certain but no adnexal mass. Other: No pelvic mass or adenopathy. No free pelvic fluid collections. No inguinal mass or adenopathy. No abdominal wall hernia or subcutaneous lesions. Musculoskeletal: No acute lumbar spine fracture. The bony pelvis is intact. Both hips are normally located. No hip fracture. IMPRESSION: 1. New/acute displaced left seventh lateral rib fracture with maximum displacement 11 mm. No associated pneumothorax. 2. Numerous posterior left rib fractures which are subacute/healing. 3. Patchy inflammation/edema and small left pleural effusion with left lower lobe atelectasis. 4. No acute abdominal/pelvic findings, mass lesions or adenopathy. 5. Large hiatal hernia. 6. Severe sigmoid colon diverticulosis without findings for acute diverticulitis. 7. Aortic atherosclerosis. Aortic Atherosclerosis (ICD10-I70.0). Electronically Signed   By: MYRTIS Stammer M.D.   On: 04/22/2024 21:50   DG Shoulder Left Result Date: 04/22/2024 CLINICAL DATA:  Shoulder pain after fall EXAM: LEFT SHOULDER - 2+ VIEW COMPARISON:  None Available. FINDINGS: There is no acute fracture or dislocation. There is severe glenohumeral and subacromial joint space narrowing sclerosis and osteophyte formation. No acute fracture or dislocation. There is soft tissue calcifications adjacent to the greater tuberosity likely related to calcific tendinitis. The bones are diffusely osteopenic. IMPRESSION: 1. No acute fracture or dislocation. 2. Severe glenohumeral and subacromial degenerative changes.  Findings compatible with rotator cuff tendinopathy. 3. Calcific tendinitis. Electronically Signed   By: Greig Pique M.D.   On: 04/22/2024 21:31   CT Head Wo Contrast Result Date: 04/21/2024 CLINICAL DATA:  Clemens.  Hit head. EXAM: CT HEAD WITHOUT CONTRAST CT CERVICAL SPINE WITHOUT CONTRAST TECHNIQUE: Multidetector CT imaging of the head and cervical spine was performed following the standard protocol without intravenous contrast. Multiplanar CT image reconstructions of the cervical spine were also generated. RADIATION DOSE REDUCTION: This exam was performed according to the departmental dose-optimization program which includes automated exposure control, adjustment of the mA and/or kV according to patient size and/or use of iterative reconstruction technique. COMPARISON:  Head CT 04/13/2024 and cervical spine CT 04/07/2024 FINDINGS: CT HEAD FINDINGS Brain: Stable age related cerebral atrophy, ventriculomegaly and periventricular white matter disease. No extra-axial fluid collections are identified. No CT findings for acute hemispheric infarction or intracranial hemorrhage. No mass lesions. The brainstem and cerebellum are normal. Vascular: Stable vascular calcifications. No aneurysm hyperdense vessels. Skull: No acute skull fracture. Sinuses/Orbits: Scattered ethmoid sinus disease. The mastoid air cells and middle ear cavities are clear. The globes are intact. Other: Small left frontal scalp hematoma. CT CERVICAL SPINE FINDINGS Alignment: The overall alignment is maintained. Stable degenerative subluxations and facet disease. Skull base and vertebrae: No acute fracture. No primary bone lesion or focal pathologic process. Soft tissues and spinal canal: No prevertebral fluid or swelling. No visible canal hematoma. Disc levels: The spinal canal is fairly generous. No large disc protrusions or canal stenosis. Stable mild multilevel bony foraminal stenosis due to uncinate spurring and facet disease. Upper  chest: The  lung apices are grossly clear. Other: Stable large right thyroid goiter with a 3.4 cm right thyroid lesion. This has been evaluated on previous imaging. (ref: J Am Coll Radiol. 2015 Feb;12(2): 143-50). IMPRESSION: 1. Stable age related cerebral atrophy, ventriculomegaly and periventricular white matter disease. 2. No acute intracranial findings or skull fracture. 3. Small left frontal scalp hematoma. 4. Stable degenerative cervical spondylosis with multilevel disc disease and facet disease but no acute cervical spine fracture. Electronically Signed   By: MYRTIS Stammer M.D.   On: 04/21/2024 10:14   CT Cervical Spine Wo Contrast Result Date: 04/21/2024 CLINICAL DATA:  Clemens.  Hit head. EXAM: CT HEAD WITHOUT CONTRAST CT CERVICAL SPINE WITHOUT CONTRAST TECHNIQUE: Multidetector CT imaging of the head and cervical spine was performed following the standard protocol without intravenous contrast. Multiplanar CT image reconstructions of the cervical spine were also generated. RADIATION DOSE REDUCTION: This exam was performed according to the departmental dose-optimization program which includes automated exposure control, adjustment of the mA and/or kV according to patient size and/or use of iterative reconstruction technique. COMPARISON:  Head CT 04/13/2024 and cervical spine CT 04/07/2024 FINDINGS: CT HEAD FINDINGS Brain: Stable age related cerebral atrophy, ventriculomegaly and periventricular white matter disease. No extra-axial fluid collections are identified. No CT findings for acute hemispheric infarction or intracranial hemorrhage. No mass lesions. The brainstem and cerebellum are normal. Vascular: Stable vascular calcifications. No aneurysm hyperdense vessels. Skull: No acute skull fracture. Sinuses/Orbits: Scattered ethmoid sinus disease. The mastoid air cells and middle ear cavities are clear. The globes are intact. Other: Small left frontal scalp hematoma. CT CERVICAL SPINE FINDINGS Alignment: The overall  alignment is maintained. Stable degenerative subluxations and facet disease. Skull base and vertebrae: No acute fracture. No primary bone lesion or focal pathologic process. Soft tissues and spinal canal: No prevertebral fluid or swelling. No visible canal hematoma. Disc levels: The spinal canal is fairly generous. No large disc protrusions or canal stenosis. Stable mild multilevel bony foraminal stenosis due to uncinate spurring and facet disease. Upper chest: The lung apices are grossly clear. Other: Stable large right thyroid goiter with a 3.4 cm right thyroid lesion. This has been evaluated on previous imaging. (ref: J Am Coll Radiol. 2015 Feb;12(2): 143-50). IMPRESSION: 1. Stable age related cerebral atrophy, ventriculomegaly and periventricular white matter disease. 2. No acute intracranial findings or skull fracture. 3. Small left frontal scalp hematoma. 4. Stable degenerative cervical spondylosis with multilevel disc disease and facet disease but no acute cervical spine fracture. Electronically Signed   By: MYRTIS Stammer M.D.   On: 04/21/2024 10:14   CT HEAD WO CONTRAST Result Date: 04/13/2024 CLINICAL DATA:  Head trauma, moderate-severe EXAM: CT HEAD WITHOUT CONTRAST TECHNIQUE: Contiguous axial images were obtained from the base of the skull through the vertex without intravenous contrast. RADIATION DOSE REDUCTION: This exam was performed according to the departmental dose-optimization program which includes automated exposure control, adjustment of the mA and/or kV according to patient size and/or use of iterative reconstruction technique. COMPARISON:  04/07/2024 FINDINGS: Brain: There is atrophy and chronic small vessel disease changes. No acute intracranial abnormality. Specifically, no hemorrhage, hydrocephalus, mass lesion, acute infarction, or significant intracranial injury. Vascular: No hyperdense vessel or unexpected calcification. Skull: No acute calvarial abnormality. Sinuses/Orbits: No acute  findings Other: None IMPRESSION: Atrophy, chronic microvascular disease. No acute intracranial abnormality. Electronically Signed   By: Franky Crease M.D.   On: 04/13/2024 20:25   CT CERVICAL SPINE WO CONTRAST Result Date: 04/13/2024 CLINICAL DATA:  Polytrauma, blunt EXAM: CT CERVICAL SPINE WITHOUT CONTRAST TECHNIQUE: Multidetector CT imaging of the cervical spine was performed without intravenous contrast. Multiplanar CT image reconstructions were also generated. RADIATION DOSE REDUCTION: This exam was performed according to the departmental dose-optimization program which includes automated exposure control, adjustment of the mA and/or kV according to patient size and/or use of iterative reconstruction technique. COMPARISON:  None Available. FINDINGS: Alignment: Normal Skull base and vertebrae: No acute fracture. No primary bone lesion or focal pathologic process. Soft tissues and spinal canal: No prevertebral fluid or swelling. No visible canal hematoma. Disc levels: Disc space narrowing and spurring diffusely. Moderate degenerative facet disease, left greater than right. Multilevel bilateral neural foraminal narrowing. Upper chest: No acute findings Other: 3 cm right thyroid nodule. This has been evaluated on previous imaging. (ref: J Am Coll Radiol. 2015 Feb;12(2): 143-50). IMPRESSION: Degenerative disc and facet disease. No acute bony abnormality. Electronically Signed   By: Franky Crease M.D.   On: 04/13/2024 20:24   CT CHEST ABDOMEN PELVIS WO CONTRAST Result Date: 04/13/2024 CLINICAL DATA:  Polytrauma, blunt EXAM: CT CHEST, ABDOMEN AND PELVIS WITHOUT CONTRAST TECHNIQUE: Multidetector CT imaging of the chest, abdomen and pelvis was performed following the standard protocol without IV contrast. RADIATION DOSE REDUCTION: This exam was performed according to the departmental dose-optimization program which includes automated exposure control, adjustment of the mA and/or kV according to patient size and/or use  of iterative reconstruction technique. COMPARISON:  None Available. FINDINGS: CT CHEST FINDINGS Cardiovascular: Heart is normal size. Three-vessel coronary artery disease and diffuse aortic atherosclerosis. No aortic aneurysm. Mediastinum/Nodes: Large hiatal hernia. No mediastinal, hilar, or axillary adenopathy. Right thyroid nodule measures 3 cm. This has been evaluated on previous imaging. (ref: J Am Coll Radiol. 2015 Feb;12(2): 143-50). Lungs/Pleura: Trace left pleural effusion, similar to prior study. Two small left lobe pulmonary nodules measuring up to 3 mm. No confluent airspace opacities. Linear scarring or atelectasis in the lung bases. No pneumothorax. Musculoskeletal: Chest wall soft tissues are unremarkable. Multiple chronic appearing posterior left rib fractures. These involve the posterior left 9th through 11th ribs. There is a fracture through the lateral left 10th rib fracture which was not definitively seen on prior study and may reflect an acute rib fracture. CT ABDOMEN PELVIS FINDINGS Hepatobiliary: No focal liver abnormality is seen. Status post cholecystectomy. No biliary dilatation. No evidence of a hepatic injury. Pancreas: No focal abnormality or ductal dilatation. Spleen: No focal abnormality.  Normal size. Adrenals/Urinary Tract: No adrenal abnormality. No focal renal abnormality. No stones or hydronephrosis. Urinary bladder is unremarkable. Stomach/Bowel: Diffuse colonic diverticulosis. No active diverticulitis. Stomach and small bowel decompressed. No bowel obstruction or inflammatory process. Vascular/Lymphatic: Aortic atherosclerosis. No evidence of aneurysm or adenopathy. Reproductive: Prior hysterectomy.  No adnexal masses. Other: No free fluid or free air. Musculoskeletal: No acute bony abnormality. IMPRESSION: Trace left pleural effusion, similar to prior study. Probable acute left lateral 10th rib fracture. Multiple old posterior left rib fractures. Coronary artery disease, aortic  atherosclerosis. Large hiatal hernia. Small left upper lobe pulmonary nodules measuring up to 3 mm. No follow-up needed if patient is low-risk (and has no known or suspected primary neoplasm). Non-contrast chest CT can be considered in 12 months if patient is high-risk. This recommendation follows the consensus statement: Guidelines for Management of Incidental Pulmonary Nodules Detected on CT Images: From the Fleischner Society 2017; Radiology 2017; 284:228-243. Colonic diverticulosis.  No active diverticulitis. Electronically Signed   By: Franky Crease M.D.   On: 04/13/2024 20:22  DG Pelvis Portable Result Date: 04/13/2024 CLINICAL DATA:  Initial evaluation for acute trauma, fall. EXAM: PORTABLE PELVIS 1-2 VIEWS COMPARISON:  Prior study from 02/24/2024 FINDINGS: Diffuse osteopenia noted, limiting evaluation of fine osseous detail. No acute fracture dislocation. Bony pelvis intact. SI joints approximated. No pubic diastasis. Osteoarthritic changes noted about the hips. Advanced spondylosis within the lower lumbar spine. No visible soft tissue injury. Scattered vascular calcifications noted. IMPRESSION: No acute osseous abnormality about the pelvis. Electronically Signed   By: Morene Hoard M.D.   On: 04/13/2024 19:05   DG Shoulder Left Port Result Date: 04/13/2024 CLINICAL DATA:  Initial evaluation for acute trauma, fall. EXAM: LEFT SHOULDER COMPARISON:  Prior radiograph from 02/24/2024 FINDINGS: Diffuse osteopenia noted, somewhat limiting evaluation of fine osseous detail. No acute fracture dislocation. 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 No visible soft tissue abnormality. Visualized left hemithorax is clear. IMPRESSION: 1. No acute osseous abnormality about the left shoulder. 2. 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 Electronically Signed   By: Morene Hoard M.D.   On: 04/13/2024 19:04   DG Chest Port 1 View Result Date:  04/13/2024 CLINICAL DATA:  Initial evaluation for acute trauma, fall. EXAM: PORTABLE CHEST 1 VIEW COMPARISON:  Radiograph from 02/24/2024 FINDINGS: Cardiomegaly, stable. Mediastinal silhouette within normal limits. Aortic atherosclerosis. Lungs normally inflated. Mild chronic coarsening of the interstitial markings noted. No focal infiltrates. No pulmonary edema. Minimal blunting of the left costophrenic angle, suggesting a trace left pleural effusion. No pneumothorax. Diffuse osteopenia. No acute osseous finding. Osteoarthritic changes noted about the shoulders bilaterally. Degenerative spondylosis noted within the visualized spine. IMPRESSION: 1. No radiographic evidence for active cardiopulmonary disease. 2. Cardiomegaly without pulmonary edema. 3. Probable trace left pleural effusion. Electronically Signed   By: Morene Hoard M.D.   On: 04/13/2024 19:02   CT Cervical Spine Wo Contrast Result Date: 04/07/2024 CLINICAL DATA:  Neck trauma.  Unwitnessed fall. EXAM: CT CERVICAL SPINE WITHOUT CONTRAST TECHNIQUE: Multidetector CT imaging of the cervical spine was performed without intravenous contrast. Multiplanar CT image reconstructions were also generated. RADIATION DOSE REDUCTION: This exam was performed according to the departmental dose-optimization program which includes automated exposure control, adjustment of the mA and/or kV according to patient size and/or use of iterative reconstruction technique. COMPARISON:  CT cervical spine 06/10/2023. Report from thyroid ultrasound 02/23/2020 FINDINGS: Alignment: Mildly exaggerated cervical lordosis. No focal angulation or significant listhesis. Skull base and vertebrae: No evidence of acute cervical spine fracture or traumatic subluxation. Soft tissues and spinal canal: No prevertebral fluid or swelling. No visible canal hematoma. Disc levels: Similar mild multilevel spondylosis with disc space narrowing, endplate osteophytes and uncinate spurring. There is  asymmetric facet hypertrophy on the left at C3-4 and C4-5. Resulting multilevel spinal stenosis and foraminal narrowing, similar to previous study. Upper chest: 3.5 x 2.9 cm right thyroid nodule is grossly unchanged. This has been previously evaluated by thyroid ultrasound. Clear lung apices. Other: None. IMPRESSION: 1. No evidence of acute cervical spine fracture, traumatic subluxation or static signs of instability. 2. Similar multilevel cervical spondylosis with resulting multilevel spinal stenosis and foraminal narrowing. 3. Stable right thyroid nodule, previously evaluated by ultrasound. Electronically Signed   By: Elsie Perone M.D.   On: 04/07/2024 14:30   CT Head Wo Contrast Result Date: 04/07/2024 CLINICAL DATA:  Head trauma, minor (Age >= 65y).  Fall. EXAM: CT HEAD WITHOUT CONTRAST TECHNIQUE: Contiguous axial images were obtained from the base of the skull through the vertex without intravenous contrast. RADIATION DOSE  REDUCTION: This exam was performed according to the departmental dose-optimization program which includes automated exposure control, adjustment of the mA and/or kV according to patient size and/or use of iterative reconstruction technique. COMPARISON:  02/24/2024 FINDINGS: Brain: There is atrophy and chronic small vessel disease changes. No acute intracranial abnormality. Specifically, no hemorrhage, hydrocephalus, mass lesion, acute infarction, or significant intracranial injury. Vascular: No hyperdense vessel or unexpected calcification. Skull: No acute calvarial abnormality. Sinuses/Orbits: No acute findings Other: None IMPRESSION: Atrophy, chronic microvascular disease. No acute intracranial abnormality. Electronically Signed   By: Franky Crease M.D.   On: 04/07/2024 14:27    Microbiology: Results for orders placed or performed during the hospital encounter of 02/24/24  Urine Culture     Status: Abnormal   Collection Time: 02/24/24  4:06 PM   Specimen: Urine, Random  Result  Value Ref Range Status   Specimen Description URINE, RANDOM  Final   Special Requests   Final    NONE Reflexed from [id number] Performed at [facility], [address] [place], [place] [address]    Culture >=100,000 COLONIES/mL ESCHERICHIA COLI (A)  Final   Report Status 02/26/2024 FINAL  Final   Organism ID, Bacteria ESCHERICHIA COLI (A)  Final      Susceptibility   Escherichia coli - MIC*    AMPICILLIN  <=2 SENSITIVE Sensitive     CEFAZOLIN <=4 SENSITIVE Sensitive     CEFEPIME <=0.12 SENSITIVE Sensitive     CEFTRIAXONE  <=0.25 SENSITIVE Sensitive     CIPROFLOXACIN <=0.25 SENSITIVE Sensitive     GENTAMICIN <=1 SENSITIVE Sensitive     IMIPENEM <=0.25 SENSITIVE Sensitive     NITROFURANTOIN <=16 SENSITIVE Sensitive     TRIMETH/SULFA >=320 RESISTANT Resistant     AMPICILLIN /SULBACTAM <=2 SENSITIVE Sensitive     PIP/TAZO <=4 SENSITIVE Sensitive ug/mL    * >=100,000 COLONIES/mL ESCHERICHIA COLI    Labs: CBC: Recent Labs  Lab 04/22/24 2208 04/23/24 0333 04/24/24 0338  WBC 11.1* 8.8 9.4  NEUTROABS 8.5*  --   --   HGB 11.2* 9.9* 11.2*  HCT 35.5* 31.4* 35.0*  MCV 79.8* 79.9* 80.1  PLT 272 265 261   Basic Metabolic Panel: Recent Labs  Lab 04/22/24 2208 04/23/24 0333  NA 133* 131*  K 4.5 4.0  CL 101 100  CO2 22 22  GLUCOSE 382* 286*  BUN 31* 30*  CREATININE 1.05* 0.99  CALCIUM  9.3 8.6*   Liver Function Tests: No results for input(s): AST, ALT, ALKPHOS, BILITOT, PROT, ALBUMIN in the last 168 hours. CBG: Recent Labs  Lab 04/25/24 0758 04/25/24 1147 04/25/24 2143 04/26/24 0810 04/26/24 1146  GLUCAP 248* 198* 309* 216* 219*    Discharge time spent: less than 30 minutes.  Signed: Toribio Door, MD [facility] 04/26/2024

## 2024-04-26 NOTE — NC FL2 (Addendum)
 [place]  MEDICAID FL2 LEVEL OF CARE FORM     IDENTIFICATION  Patient Name: Kiara Blair Birthdate: Mar 10, 1930 Sex: female Admission Date (Current Location): 04/22/2024  [facility] and [place] Number:  Producer, television/film/video and Address:  [facility],  [address] [place], [place] [address]      Provider Number: [id number]  Attending Physician Name and Address:  Jadine Toribio SQUIBB, MD  Relative Name and Phone Number:  Madicyn Mesina (son) - [phone]    Current Level of Care: Hospital Recommended Level of Care: Assisted Living Facility Prior Approval Number:    Date Approved/Denied:   PASRR Number: [id number]  Discharge Plan: Other (Comment) ([facility])    Current Diagnoses: Patient Active Problem List   Diagnosis Date Noted   Tendinopathy of left rotator cuff 04/23/2024   Altered mental status 02/24/2024   Lower urinary tract infectious disease 02/24/2024   Fall 02/24/2024   Rib fracture 02/24/2024   Chronic health problem 02/24/2024   Abdominal pain 02/24/2024   Acute metabolic encephalopathy 11/29/2022   Stroke-like symptoms 11/27/2022   Generalized anxiety disorder 03/22/2022   History of CVA (cerebrovascular accident) 01/04/2021   Essential hypertension 01/03/2021   T2DM (type 2 diabetes mellitus) (HCC) 01/03/2021   Paresthesia 05/01/2020   Nuclear sclerotic cataract of left eye 03/29/2018   Sensorineural hearing loss (SNHL) of both ears 03/13/2017   Gastroesophageal reflux disease 06/26/2016   Diabetic polyneuropathy associated with type 2 diabetes mellitus (HCC) 01/10/2016   Essential tremor 10/22/2015   Hyperlipidemia LDL goal <100 10/22/2015    Orientation RESPIRATION BLADDER Height & Weight     Self  Normal Continent Weight: 133 lb 6.1 oz (60.5 kg) Height:  5' 3 (160 cm)  BEHAVIORAL SYMPTOMS/MOOD NEUROLOGICAL BOWEL NUTRITION STATUS      Continent Diet (Carb motified)  AMBULATORY STATUS COMMUNICATION OF NEEDS Skin   Supervision  Verbally Normal                       Personal Care Assistance Level of Assistance  Bathing, Feeding, Dressing Bathing Assistance: Limited assistance Feeding assistance: Independent Dressing Assistance: Limited assistance     Functional Limitations Info  Sight, Hearing, Speech Sight Info: Impaired (eye glasses) Hearing Info: Impaired (hard-of-hearing) Speech Info: Adequate    SPECIAL CARE FACTORS FREQUENCY                       Contractures Contractures Info: Not present    Additional Factors Info  Code Status, Allergies Code Status Info: DNR Allergies Info: Doxycycline Hyclate, Levofloxacin, Meloxicam, Naproxen-esomeprazole Mg, Nitrofurantoin, Other, Pneumococcal Polysaccharide Vaccine           Current Medications (04/26/2024):  This is the current hospital active medication list Current Facility-Administered Medications  Medication Dose Route Frequency Provider Last Rate Last Admin   acetaminophen  (TYLENOL ) tablet 1,000 mg  1,000 mg Oral TID Amponsah, Prosper M, MD   1,000 mg at 04/26/24 0019   amLODipine  (NORVASC ) tablet 5 mg  5 mg Oral Daily Amponsah, Prosper M, MD   5 mg at 04/25/24 1005   artificial tears ophthalmic solution 1 drop  1 drop Both Eyes TID Amponsah, Prosper M, MD   1 drop at 04/25/24 2236   aspirin  EC tablet 81 mg  81 mg Oral Daily Amponsah, Prosper M, MD   81 mg at 04/25/24 1006   atorvastatin  (LIPITOR) tablet 40 mg  40 mg Oral QPM Amponsah, Prosper M, MD   40 mg  at 04/25/24 1852   bacitracin  ointment   Topical BID Tammy Sor, PA-C   1 Application at 04/24/24 1155   diclofenac  Sodium (VOLTAREN ) 1 % topical gel 2 g  2 g Topical BID Amponsah, Prosper M, MD   2 g at 04/25/24 2234   enoxaparin  (LOVENOX ) injection 30 mg  30 mg Subcutaneous Q24H Amponsah, Prosper M, MD   30 mg at 04/26/24 9047   escitalopram  (LEXAPRO ) tablet 5 mg  5 mg Oral QHS Amponsah, Prosper M, MD   5 mg at 04/25/24 2224   haloperidol  lactate (HALDOL ) injection 1 mg  1 mg  Intravenous Q6H PRN Jadine Toribio SQUIBB, MD   1 mg at 04/26/24 0049   insulin  aspart (novoLOG ) injection 0-15 Units  0-15 Units Subcutaneous TID WC Amponsah, Prosper M, MD   5 Units at 04/26/24 0820   insulin  aspart (novoLOG ) injection 0-5 Units  0-5 Units Subcutaneous QHS Amponsah, Prosper M, MD   4 Units at 04/25/24 2237   insulin  glargine-yfgn (SEMGLEE ) injection 10 Units  10 Units Subcutaneous Daily Jadine Toribio SQUIBB, MD   10 Units at 04/26/24 9048   lidocaine  (LIDODERM ) 5 % 1 patch  1 patch Transdermal Q24H Lou Claretta HERO, MD   1 patch at 04/26/24 0019   meclizine  (ANTIVERT ) tablet 12.5 mg  12.5 mg Oral TID PRN Amponsah, Prosper M, MD       methocarbamol  (ROBAXIN ) tablet 500 mg  500 mg Oral Q8H PRN Tammy Sor, PA-C   500 mg at 04/26/24 9376   multivitamin with minerals tablet 1 tablet  1 tablet Oral BID Lou Claretta HERO, MD   1 tablet at 04/25/24 2222   ondansetron  (ZOFRAN ) tablet 4 mg  4 mg Oral Q6H PRN Amponsah, Prosper M, MD       Or   ondansetron  (ZOFRAN ) injection 4 mg  4 mg Intravenous Q6H PRN Lou Claretta HERO, MD       oxyCODONE  (Oxy IR/ROXICODONE ) immediate release tablet 2.5-5 mg  2.5-5 mg Oral Q6H PRN Tammy Sor, PA-C   5 mg at 04/26/24 9377   propranolol  (INDERAL ) tablet 10 mg  10 mg Oral BID Amponsah, Prosper M, MD   10 mg at 04/25/24 2224   QUEtiapine  (SEROQUEL ) tablet 12.5 mg  12.5 mg Oral Once Chavez, Abigail, NP       senna (SENOKOT) tablet 8.6 mg  1 tablet Oral QODAY Amponsah, Prosper M, MD   8.6 mg at 04/24/24 0806   senna-docusate (Senokot-S) tablet 1 tablet  1 tablet Oral QHS PRN Lou Claretta HERO, MD         Discharge Medications:  acetaminophen  325 MG tablet Commonly known as: Tylenol  Take 2 tablets (650 mg total) by mouth every 6 (six) hours as needed. What changed: reasons to take this    amLODipine  5 MG tablet Commonly known as: NORVASC  Take 5 mg by mouth daily.    aspirin  EC 81 MG tablet Take 1 tablet (81 mg total) by mouth daily.  Swallow whole.    atorvastatin  40 MG tablet Commonly known as: LIPITOR Take 1 tablet (40 mg total) by mouth daily.    escitalopram  5 MG tablet Commonly known as: LEXAPRO  Take 5 mg by mouth daily.    hydrOXYzine  25 MG capsule Commonly known as: VISTARIL  Take 25 mg by mouth 3 (three) times daily.    insulin  aspart 100 UNIT/ML FlexPen Commonly known as: NOVOLOG  Inject 5 Units into the skin 3 (three) times daily with meals. HOLD IF BS<100 OR IF  THE RESIDENT IS NOT EATING    Lantus  SoloStar 100 UNIT/ML Solostar Pen Generic drug: insulin  glargine Inject 10 Units into the skin at bedtime.    lidocaine  5 % Commonly known as: Lidoderm  Place 1 patch onto the skin daily. Remove & Discard patch within 12 hours or as directed by MD    meclizine  12.5 MG tablet Commonly known as: ANTIVERT  Take 12.5 mg by mouth 3 (three) times daily as needed for dizziness or nausea.    methocarbamol  500 MG tablet Commonly known as: ROBAXIN  Take 1 tablet (500 mg total) by mouth every 8 (eight) hours as needed for muscle spasms.    multivitamin with minerals Tabs tablet Take 1 tablet by mouth in the morning and at bedtime. Dr. Whitakers Vitamins.    oxyCODONE  5 MG immediate release tablet Commonly known as: Oxy IR/ROXICODONE  Take 0.5-1 tablets (2.5-5 mg total) by mouth every 8 (eight) hours as needed for severe pain (pain score 7-10).    pregabalin  100 MG capsule Commonly known as: Lyrica  Take 1 capsule (100 mg total) by mouth 2 (two) times daily.    propranolol  10 MG tablet Commonly known as: INDERAL  Take 10 mg by mouth 2 (two) times daily.    Senna-Tabs 8.6 MG tablet Generic drug: senna Take 1 tablet by mouth every other day.    THERATEARS OP Apply 1 drop to eye 3 (three) times daily.    Vitamin D  (Ergocalciferol ) 1.25 MG (50000 UNIT) Caps capsule Commonly known as: DRISDOL Take 50,000 Units by mouth once a week.    Vitamin D3 50 MCG (2000 UT) Tabs Take 1 tablet by mouth daily.     Voltaren  1 % Gel Generic drug: diclofenac  Sodium Apply 2 g topically 2 (two) times daily.    Relevant Imaging Results:  Relevant Lab Results:   Additional Information SSN:[id number]  Heather DELENA Saltness, LCSW

## 2024-04-26 NOTE — Progress Notes (Signed)
 Nurse called report to Tameka at [facility].  No concerns. Pt's granddaughter at bedside and has pt's prescriptions and discharge AVS.  Awaiting PTAR to transport.

## 2024-04-26 NOTE — TOC Transition Note (Signed)
 Transition of Care [facility]) - Discharge Note   Patient Details  Name: Kiara Blair MRN: [id number] Date of Birth: 03-12-1930  Transition of Care [facility]) CM/SW Contact:  Heather DELENA Saltness, LCSW Phone Number: 04/26/2024, 1:45 PM   Clinical Narrative:    Pt to discharge back to [facility]. Pt set up with [facility] PT through [facility]. Per facility, pt will need to return to facility with any new medications because their pharmacy is closed today. Pt's family to fill prescriptions and bring new medications to facility. D/C packet with DNR placed in pt's chart at RN station. RN to call report to [phone]. PTAR called at 2:00 PM. Pt and family in agreement with d/c plan. No further TOC needs at this time. TOC signing off.   Final next level of care: Assisted Living Barriers to Discharge: Barriers Resolved   Patient Goals and CMS Choice Patient states their goals for this hospitalization and ongoing recovery are:: To return to [facility]        Discharge Placement  [facility] with [facility] services              Patient to be transferred to facility by: PTAR Name of family member notified: pt's son, [name] Auman Patient and family notified of of transfer: 04/26/24  Discharge Plan and Services Additional resources added to the After Visit Summary for  [facility] services In-house Referral: Clinical Social Work              DME Arranged: N/A DME Agency: NA       HH Arranged: PT HH Agency: [facility] Date [facility] Agency Contacted: 04/26/24 Time HH Agency Contacted: [id number] Representative spoke with at [facility] Agency: Cindie  Social Drivers of Health (SDOH) Interventions SDOH Screenings   Food Insecurity: No Food Insecurity (04/23/2024)  Housing: Low Risk  (04/23/2024)  Transportation Needs: No Transportation Needs (04/23/2024)  Utilities: Not At Risk (04/23/2024)  Financial Resource Strain: Low Risk  (01/02/2024)   Received from [facility]  Physical Activity: Sufficiently  Active (06/15/2020)   Received from [facility]  Social Connections: Socially Isolated (04/23/2024)  Stress: Stress Concern Present (06/15/2020)   Received from [facility]  Tobacco Use: Low Risk  (04/23/2024)     Readmission Risk Interventions    04/24/2024   10:30 AM  Readmission Risk Prevention Plan  Transportation Screening Complete  PCP or Specialist Appt within 3-5 Days Complete  HRI or Home Care Consult Complete  Social Work Consult for Recovery Care Planning/Counseling Complete  Palliative Care Screening Not Applicable  Medication Review Ocean[employer]) Complete

## 2024-04-26 NOTE — TOC Progression Note (Signed)
 Transition of Care [facility]) - Progression Note    Patient Details  Name: Kiara Blair MRN: [id number] Date of Birth: 11/10/1929  Transition of Care [facility]) CM/SW Contact  Heather DELENA Saltness, LCSW Phone Number: 04/26/2024, 10:14 AM  Clinical Narrative:    CSW spoke with Dorthea, staff RN, at [facility], who reports pt can return to facility once they review signed FL2 and d/c summary. TOC will fax info over to facility at [phone]. TOC will continue to follow.   Expected Discharge Plan: Assisted Living Barriers to Discharge: Continued Medical Work up  Expected Discharge Plan and Services In-house Referral: Clinical Social Work     Living arrangements for the past 2 months: Assisted Living Facility ([facility])                                     Social Determinants of Health (SDOH) Interventions SDOH Screenings   Food Insecurity: No Food Insecurity (04/23/2024)  Housing: Low Risk  (04/23/2024)  Transportation Needs: No Transportation Needs (04/23/2024)  Utilities: Not At Risk (04/23/2024)  Financial Resource Strain: Low Risk  (01/02/2024)   Received from [facility]  Physical Activity: Sufficiently Active (06/15/2020)   Received from [facility]  Social Connections: Socially Isolated (04/23/2024)  Stress: Stress Concern Present (06/15/2020)   Received from [facility]  Tobacco Use: Low Risk  (04/23/2024)    Readmission Risk Interventions    04/24/2024   10:30 AM  Readmission Risk Prevention Plan  Transportation Screening Complete  PCP or Specialist Appt within 3-5 Days Complete  HRI or Home Care Consult Complete  Social Work Consult for Recovery Care Planning/Counseling Complete  Palliative Care Screening Not Applicable  Medication Review (RN Care Manager) Complete    Heather Saltness, MSW, LCSW 04/26/2024 10:16 AM

## 2024-04-26 NOTE — Progress Notes (Signed)
 Pt noted sleeping and calm.  Pt's son stated that he could not get her to eat breakfast and pt has been in and out of sleep. Pt too drowsy/sleepy to administer oral medications this morning.

## 2024-04-30 ENCOUNTER — Emergency Department (HOSPITAL_COMMUNITY)

## 2024-04-30 ENCOUNTER — Inpatient Hospital Stay (HOSPITAL_COMMUNITY)

## 2024-04-30 ENCOUNTER — Encounter (HOSPITAL_COMMUNITY): Payer: Self-pay

## 2024-04-30 ENCOUNTER — Other Ambulatory Visit: Payer: Self-pay

## 2024-04-30 ENCOUNTER — Inpatient Hospital Stay (HOSPITAL_COMMUNITY)
Admission: EM | Admit: 2024-04-30 | Discharge: 2024-05-06 | DRG: 177 | Disposition: A | Discharge status: Skilled Nursing Facility | Attending: Internal Medicine | Admitting: Internal Medicine

## 2024-04-30 DIAGNOSIS — I10 Essential (primary) hypertension: Secondary | ICD-10-CM | POA: Diagnosis present

## 2024-04-30 DIAGNOSIS — Z8659 Personal history of other mental and behavioral disorders: Secondary | ICD-10-CM

## 2024-04-30 DIAGNOSIS — J918 Pleural effusion in other conditions classified elsewhere: Secondary | ICD-10-CM | POA: Diagnosis present

## 2024-04-30 DIAGNOSIS — Z515 Encounter for palliative care: Secondary | ICD-10-CM | POA: Diagnosis not present

## 2024-04-30 DIAGNOSIS — E871 Hypo-osmolality and hyponatremia: Secondary | ICD-10-CM | POA: Diagnosis present

## 2024-04-30 DIAGNOSIS — Z794 Long term (current) use of insulin: Secondary | ICD-10-CM

## 2024-04-30 DIAGNOSIS — J690 Pneumonitis due to inhalation of food and vomit: Secondary | ICD-10-CM | POA: Diagnosis present

## 2024-04-30 DIAGNOSIS — S2242XA Multiple fractures of ribs, left side, initial encounter for closed fracture: Secondary | ICD-10-CM | POA: Diagnosis present

## 2024-04-30 DIAGNOSIS — Z66 Do not resuscitate: Secondary | ICD-10-CM | POA: Diagnosis not present

## 2024-04-30 DIAGNOSIS — E7800 Pure hypercholesterolemia, unspecified: Secondary | ICD-10-CM | POA: Diagnosis present

## 2024-04-30 DIAGNOSIS — F0390 Unspecified dementia without behavioral disturbance: Secondary | ICD-10-CM | POA: Diagnosis present

## 2024-04-30 DIAGNOSIS — J90 Pleural effusion, not elsewhere classified: Secondary | ICD-10-CM

## 2024-04-30 DIAGNOSIS — J189 Pneumonia, unspecified organism: Secondary | ICD-10-CM

## 2024-04-30 DIAGNOSIS — Z888 Allergy status to other drugs, medicaments and biological substances status: Secondary | ICD-10-CM

## 2024-04-30 DIAGNOSIS — E876 Hypokalemia: Secondary | ICD-10-CM | POA: Diagnosis present

## 2024-04-30 DIAGNOSIS — S271XXA Traumatic hemothorax, initial encounter: Secondary | ICD-10-CM | POA: Diagnosis present

## 2024-04-30 DIAGNOSIS — L89150 Pressure ulcer of sacral region, unstageable: Secondary | ICD-10-CM | POA: Diagnosis present

## 2024-04-30 DIAGNOSIS — Z7189 Other specified counseling: Secondary | ICD-10-CM | POA: Diagnosis not present

## 2024-04-30 DIAGNOSIS — W07XXXA Fall from chair, initial encounter: Secondary | ICD-10-CM | POA: Diagnosis present

## 2024-04-30 DIAGNOSIS — E1140 Type 2 diabetes mellitus with diabetic neuropathy, unspecified: Secondary | ICD-10-CM | POA: Diagnosis present

## 2024-04-30 DIAGNOSIS — R296 Repeated falls: Secondary | ICD-10-CM | POA: Diagnosis present

## 2024-04-30 DIAGNOSIS — I509 Heart failure, unspecified: Secondary | ICD-10-CM | POA: Diagnosis not present

## 2024-04-30 DIAGNOSIS — W19XXXA Unspecified fall, initial encounter: Principal | ICD-10-CM

## 2024-04-30 DIAGNOSIS — T40605A Adverse effect of unspecified narcotics, initial encounter: Secondary | ICD-10-CM | POA: Diagnosis present

## 2024-04-30 DIAGNOSIS — E1165 Type 2 diabetes mellitus with hyperglycemia: Secondary | ICD-10-CM | POA: Diagnosis present

## 2024-04-30 DIAGNOSIS — Z8249 Family history of ischemic heart disease and other diseases of the circulatory system: Secondary | ICD-10-CM

## 2024-04-30 DIAGNOSIS — Z90710 Acquired absence of both cervix and uterus: Secondary | ICD-10-CM

## 2024-04-30 DIAGNOSIS — R410 Disorientation, unspecified: Secondary | ICD-10-CM

## 2024-04-30 DIAGNOSIS — R4182 Altered mental status, unspecified: Secondary | ICD-10-CM

## 2024-04-30 DIAGNOSIS — R911 Solitary pulmonary nodule: Secondary | ICD-10-CM | POA: Diagnosis present

## 2024-04-30 DIAGNOSIS — S0083XA Contusion of other part of head, initial encounter: Secondary | ICD-10-CM | POA: Diagnosis present

## 2024-04-30 DIAGNOSIS — G250 Essential tremor: Secondary | ICD-10-CM | POA: Diagnosis present

## 2024-04-30 DIAGNOSIS — Z7982 Long term (current) use of aspirin: Secondary | ICD-10-CM | POA: Diagnosis not present

## 2024-04-30 DIAGNOSIS — Y92009 Unspecified place in unspecified non-institutional (private) residence as the place of occurrence of the external cause: Secondary | ICD-10-CM | POA: Diagnosis not present

## 2024-04-30 DIAGNOSIS — R4701 Aphasia: Secondary | ICD-10-CM | POA: Diagnosis present

## 2024-04-30 DIAGNOSIS — S0012XA Contusion of left eyelid and periocular area, initial encounter: Secondary | ICD-10-CM | POA: Diagnosis not present

## 2024-04-30 DIAGNOSIS — R739 Hyperglycemia, unspecified: Secondary | ICD-10-CM | POA: Diagnosis not present

## 2024-04-30 DIAGNOSIS — Z8673 Personal history of transient ischemic attack (TIA), and cerebral infarction without residual deficits: Secondary | ICD-10-CM

## 2024-04-30 LAB — COMPREHENSIVE METABOLIC PANEL WITH GFR
ALT: 23 U/L (ref 0–44)
ALT: 20 U/L (ref 0–44)
AST: 26 U/L (ref 15–41)
AST: 25 U/L (ref 15–41)
Albumin: 2.8 g/dL — ABNORMAL LOW (ref 3.5–5.0)
Albumin: 2.8 g/dL — ABNORMAL LOW (ref 3.5–5.0)
Alkaline Phosphatase: 148 U/L — ABNORMAL HIGH (ref 38–126)
Alkaline Phosphatase: 146 U/L — ABNORMAL HIGH (ref 38–126)
Anion gap: 12 (ref 5–15)
Anion gap: 11 (ref 5–15)
BUN: 18 mg/dL (ref 8–23)
BUN: 16 mg/dL (ref 8–23)
CO2: 24 mmol/L (ref 22–32)
CO2: 23 mmol/L (ref 22–32)
Calcium: 9.5 mg/dL (ref 8.9–10.3)
Calcium: 9.2 mg/dL (ref 8.9–10.3)
Chloride: 93 mmol/L — ABNORMAL LOW (ref 98–111)
Chloride: 95 mmol/L — ABNORMAL LOW (ref 98–111)
Creatinine, Ser: 0.81 mg/dL (ref 0.44–1.00)
Creatinine, Ser: 0.68 mg/dL (ref 0.44–1.00)
GFR, Estimated: >60 mL/min (ref >60)
GFR, Estimated: >60 mL/min (ref >60)
Glucose, Bld: 322 mg/dL — ABNORMAL HIGH (ref 70–99)
Glucose, Bld: 295 mg/dL — ABNORMAL HIGH (ref 70–99)
Potassium: 4.3 mmol/L (ref 3.5–5.1)
Potassium: 4.3 mmol/L (ref 3.5–5.1)
Sodium: 129 mmol/L — ABNORMAL LOW (ref 135–145)
Sodium: 129 mmol/L — ABNORMAL LOW (ref 135–145)
Total Bilirubin: 0.9 mg/dL (ref 0.0–1.2)
Total Bilirubin: 0.9 mg/dL (ref 0.0–1.2)
Total Protein: 6 g/dL — ABNORMAL LOW (ref 6.5–8.1)
Total Protein: 5.9 g/dL — ABNORMAL LOW (ref 6.5–8.1)

## 2024-04-30 LAB — HEMOGLOBIN A1C
Hgb A1c MFr Bld: 9.8 % — ABNORMAL HIGH (ref 4.8–5.6)
Mean Plasma Glucose: 234.56 mg/dL

## 2024-04-30 LAB — I-STAT CHEM 8, ED
BUN: 21 mg/dL (ref 8–23)
BUN: 18 mg/dL (ref 8–23)
Calcium, Ion: 1.2 mmol/L (ref 1.15–1.40)
Calcium, Ion: 1.15 mmol/L (ref 1.15–1.40)
Chloride: 95 mmol/L — ABNORMAL LOW (ref 98–111)
Chloride: 97 mmol/L — ABNORMAL LOW (ref 98–111)
Creatinine, Ser: 0.7 mg/dL (ref 0.44–1.00)
Creatinine, Ser: 0.7 mg/dL (ref 0.44–1.00)
Glucose, Bld: 328 mg/dL — ABNORMAL HIGH (ref 70–99)
Glucose, Bld: 299 mg/dL — ABNORMAL HIGH (ref 70–99)
HCT: 40 % (ref 36.0–46.0)
HCT: 39 % (ref 36.0–46.0)
Hemoglobin: 13.6 g/dL (ref 12.0–15.0)
Hemoglobin: 13.3 g/dL (ref 12.0–15.0)
Potassium: 4.4 mmol/L (ref 3.5–5.1)
Potassium: 4.3 mmol/L (ref 3.5–5.1)
Sodium: 129 mmol/L — ABNORMAL LOW (ref 135–145)
Sodium: 130 mmol/L — ABNORMAL LOW (ref 135–145)
TCO2: 26 mmol/L (ref 22–32)
TCO2: 24 mmol/L (ref 22–32)

## 2024-04-30 LAB — CBC
HCT: 35.7 % — ABNORMAL LOW (ref 36.0–46.0)
HCT: 37 % (ref 36.0–46.0)
Hemoglobin: 12 g/dL (ref 12.0–15.0)
Hemoglobin: 12.2 g/dL (ref 12.0–15.0)
MCH: 26 pg (ref 26.0–34.0)
MCH: 25.6 pg — ABNORMAL LOW (ref 26.0–34.0)
MCHC: 33.6 g/dL (ref 30.0–36.0)
MCHC: 33 g/dL (ref 30.0–36.0)
MCV: 77.4 fL — ABNORMAL LOW (ref 80.0–100.0)
MCV: 77.6 fL — ABNORMAL LOW (ref 80.0–100.0)
Platelets: 406 K/uL — ABNORMAL HIGH (ref 150–400)
Platelets: 344 K/uL (ref 150–400)
RBC: 4.61 MIL/uL (ref 3.87–5.11)
RBC: 4.77 MIL/uL (ref 3.87–5.11)
RDW: 16 % — ABNORMAL HIGH (ref 11.5–15.5)
RDW: 15.9 % — ABNORMAL HIGH (ref 11.5–15.5)
WBC: 9.9 K/uL (ref 4.0–10.5)
WBC: 9.8 K/uL (ref 4.0–10.5)
nRBC: 0 % (ref 0.0–0.2)
nRBC: 0 % (ref 0.0–0.2)

## 2024-04-30 LAB — VITAMIN B12: Vitamin B-12: 821 pg/mL (ref 180–914)

## 2024-04-30 LAB — DIFFERENTIAL
Abs Immature Granulocytes: 0.07 K/uL (ref 0.00–0.07)
Basophils Absolute: 0 K/uL (ref 0.0–0.1)
Basophils Relative: 0 %
Eosinophils Absolute: 0.2 K/uL (ref 0.0–0.5)
Eosinophils Relative: 2 %
Immature Granulocytes: 1 %
Lymphocytes Relative: 18 %
Lymphs Abs: 1.7 K/uL (ref 0.7–4.0)
Monocytes Absolute: 1.2 K/uL — ABNORMAL HIGH (ref 0.1–1.0)
Monocytes Relative: 12 %
Neutro Abs: 6.6 K/uL (ref 1.7–7.7)
Neutrophils Relative %: 67 %

## 2024-04-30 LAB — URINALYSIS, ROUTINE W REFLEX MICROSCOPIC
Bacteria, UA: NONE SEEN
Bilirubin Urine: NEGATIVE
Glucose, UA: >=500 mg/dL — AB
Hgb urine dipstick: NEGATIVE
Ketones, ur: 5 mg/dL — AB
Leukocytes,Ua: NEGATIVE
Nitrite: NEGATIVE
Protein, ur: NEGATIVE mg/dL
Specific Gravity, Urine: 1.006 (ref 1.005–1.030)
pH: 7 (ref 5.0–8.0)

## 2024-04-30 LAB — APTT: aPTT: 30 s (ref 24–36)

## 2024-04-30 LAB — PROCALCITONIN: Procalcitonin: <0.1 ng/mL

## 2024-04-30 LAB — PROTIME-INR
INR: 1 (ref 0.8–1.2)
INR: 1 (ref 0.8–1.2)
Prothrombin Time: 13.4 s (ref 11.4–15.2)
Prothrombin Time: 13.5 s (ref 11.4–15.2)

## 2024-04-30 LAB — LIPID PANEL
Cholesterol: 135 mg/dL (ref 0–200)
HDL: 65 mg/dL (ref >40)
LDL Cholesterol: 48 mg/dL (ref 0–99)
Total CHOL/HDL Ratio: 2.1 ratio
Triglycerides: 110 mg/dL (ref <150)
VLDL: 22 mg/dL (ref 0–40)

## 2024-04-30 LAB — MAGNESIUM: Magnesium: 1.6 mg/dL — ABNORMAL LOW (ref 1.7–2.4)

## 2024-04-30 LAB — CBG MONITORING, ED: Glucose-Capillary: 279 mg/dL — ABNORMAL HIGH (ref 70–99)

## 2024-04-30 LAB — FOLATE: Folate: 17.1 ng/mL (ref >5.9)

## 2024-04-30 LAB — ETHANOL: Alcohol, Ethyl (B): <15 mg/dL (ref <15)

## 2024-04-30 LAB — T4, FREE: Free T4: 1.3 ng/dL — ABNORMAL HIGH (ref 0.61–1.12)

## 2024-04-30 LAB — TSH: TSH: 1.325 u[IU]/mL (ref 0.350–4.500)

## 2024-04-30 MED ORDER — ESCITALOPRAM OXALATE 10 MG PO TABS
5.0000 mg | ORAL_TABLET | Freq: Every day | ORAL | Status: DC
  Administered 2024-04-30 – 2024-05-04 (×5): 5 mg via ORAL
  Filled 2024-04-30 (×5): qty 1

## 2024-04-30 MED ORDER — ACETAMINOPHEN 650 MG RE SUPP
650.0000 mg | Freq: Four times a day (QID) | RECTAL | Status: DC | PRN
Start: 2024-04-30 — End: 2024-05-01

## 2024-04-30 MED ORDER — SODIUM CHLORIDE 0.9 % IV SOLN
2.0000 g | INTRAVENOUS | Status: DC
  Administered 2024-05-01: 2 g via INTRAVENOUS
  Filled 2024-04-30: qty 20

## 2024-04-30 MED ORDER — MORPHINE SULFATE (PF) 2 MG/ML IV SOLN
2.0000 mg | INTRAVENOUS | Status: DC | PRN
  Administered 2024-04-30 (×2): 2 mg via INTRAVENOUS
  Filled 2024-04-30 (×2): qty 1

## 2024-04-30 MED ORDER — HYDRALAZINE HCL 20 MG/ML IJ SOLN
5.0000 mg | INTRAMUSCULAR | Status: DC | PRN
  Administered 2024-05-01: 5 mg via INTRAVENOUS
  Filled 2024-04-30: qty 1

## 2024-04-30 MED ORDER — DOCUSATE SODIUM 100 MG PO CAPS
100.0000 mg | ORAL_CAPSULE | Freq: Every day | ORAL | Status: DC | PRN
  Administered 2024-05-03: 100 mg via ORAL
  Filled 2024-04-30: qty 1

## 2024-04-30 MED ORDER — BENAZEPRIL HCL 20 MG PO TABS
20.0000 mg | ORAL_TABLET | Freq: Every day | ORAL | Status: DC
  Administered 2024-05-01 – 2024-05-06 (×6): 20 mg via ORAL
  Filled 2024-04-30 (×7): qty 1

## 2024-04-30 MED ORDER — LIDOCAINE 5 % EX PTCH
1.0000 | MEDICATED_PATCH | CUTANEOUS | Status: DC
  Administered 2024-04-30 – 2024-05-06 (×7): 1 via TRANSDERMAL
  Filled 2024-04-30 (×7): qty 1

## 2024-04-30 MED ORDER — GUAIFENESIN ER 600 MG PO TB12: 600.0000 mg | ORAL_TABLET | Freq: Two times a day (BID) | ORAL | Status: DC | PRN

## 2024-04-30 MED ORDER — ACETAMINOPHEN 325 MG PO TABS
650.0000 mg | ORAL_TABLET | Freq: Four times a day (QID) | ORAL | Status: DC | PRN
Start: 2024-04-30 — End: 2024-05-01
  Administered 2024-04-30: 650 mg via ORAL
  Filled 2024-04-30: qty 2

## 2024-04-30 MED ORDER — SODIUM CHLORIDE 0.9 % IV SOLN
500.0000 mg | INTRAVENOUS | Status: DC
  Administered 2024-05-01: 500 mg via INTRAVENOUS
  Filled 2024-04-30: qty 5

## 2024-04-30 MED ORDER — HEPARIN SODIUM (PORCINE) 5000 UNIT/ML IJ SOLN
5000.0000 [IU] | Freq: Three times a day (TID) | INTRAMUSCULAR | Status: DC
  Administered 2024-04-30 – 2024-05-06 (×18): 5000 [IU] via SUBCUTANEOUS
  Filled 2024-04-30 (×18): qty 1

## 2024-04-30 MED ORDER — MAGNESIUM SULFATE 2 GM/50ML IV SOLN
2.0000 g | Freq: Once | INTRAVENOUS | Status: AC
  Administered 2024-04-30: 2 g via INTRAVENOUS
  Filled 2024-04-30: qty 50

## 2024-04-30 MED ORDER — ATORVASTATIN CALCIUM 40 MG PO TABS
40.0000 mg | ORAL_TABLET | Freq: Every day | ORAL | Status: DC
  Administered 2024-04-30 – 2024-05-04 (×5): 40 mg via ORAL
  Filled 2024-04-30 (×5): qty 1

## 2024-04-30 MED ORDER — ALBUTEROL SULFATE (2.5 MG/3ML) 0.083% IN NEBU: 2.5000 mg | INHALATION_SOLUTION | RESPIRATORY_TRACT | Status: DC | PRN

## 2024-04-30 MED ORDER — LACTATED RINGERS IV BOLUS
500.0000 mL | Freq: Once | INTRAVENOUS | Status: AC
  Administered 2024-04-30: 500 mL via INTRAVENOUS

## 2024-04-30 MED ORDER — PROPRANOLOL HCL 10 MG PO TABS
10.0000 mg | ORAL_TABLET | Freq: Two times a day (BID) | ORAL | Status: DC
  Administered 2024-05-01 – 2024-05-06 (×10): 10 mg via ORAL
  Filled 2024-04-30 (×10): qty 1

## 2024-04-30 MED ORDER — SODIUM CHLORIDE 0.9 % IV SOLN
1.0000 g | Freq: Once | INTRAVENOUS | Status: AC
  Administered 2024-04-30: 1 g via INTRAVENOUS
  Filled 2024-04-30: qty 10

## 2024-04-30 MED ORDER — SODIUM CHLORIDE 0.9 % IV SOLN
500.0000 mg | Freq: Once | INTRAVENOUS | Status: AC
  Administered 2024-04-30: 500 mg via INTRAVENOUS
  Filled 2024-04-30: qty 5

## 2024-04-30 MED ORDER — SODIUM CHLORIDE 0.9 % IV SOLN: Freq: Once | INTRAVENOUS | Status: AC

## 2024-04-30 MED ORDER — SODIUM CHLORIDE 0.9% FLUSH: 3.0000 mL | Freq: Once | INTRAVENOUS | Status: DC

## 2024-04-30 MED ORDER — HYDRALAZINE HCL 20 MG/ML IJ SOLN
5.0000 mg | INTRAMUSCULAR | Status: DC | PRN
  Administered 2024-04-30: 5 mg via INTRAVENOUS
  Filled 2024-04-30: qty 1

## 2024-04-30 MED ORDER — ASPIRIN 81 MG PO TBEC
81.0000 mg | DELAYED_RELEASE_TABLET | Freq: Every day | ORAL | Status: DC
  Administered 2024-04-30 – 2024-05-06 (×7): 81 mg via ORAL
  Filled 2024-04-30 (×7): qty 1

## 2024-04-30 MED ORDER — ACETAMINOPHEN 325 MG PO TABS
650.0000 mg | ORAL_TABLET | Freq: Once | ORAL | Status: AC
  Administered 2024-04-30: 650 mg via ORAL
  Filled 2024-04-30: qty 2

## 2024-04-30 MED ORDER — AMLODIPINE BESYLATE 5 MG PO TABS
5.0000 mg | ORAL_TABLET | Freq: Every day | ORAL | Status: DC
  Administered 2024-05-01 – 2024-05-06 (×6): 5 mg via ORAL
  Filled 2024-04-30 (×6): qty 1

## 2024-04-30 NOTE — H&P (Signed)
 History and Physical    Patient: Kiara Blair FMW:[id number] DOB: Feb 27, 1930 DOA: 04/30/2024 DOS: the patient was seen and examined on 04/30/2024 . PCP: Neysa Tinnie BRAVO, PA  Patient coming from: Home Chief complaint: Chief Complaint  Patient presents with   Fall   HPI:  Kiara Blair is a 89 y.o. female with past medical history  of   T2DM, HTN, HLD, TIA, essential tremor, GERD, frequent falls c/b rib fractures, anxiety and neuropathy who presents for fall.  Patient was discharged on 19 July after a fall trace left pneumothorax, delirium HPI is limited due to same.  ED Course:  Vital signs in the ED were notable for the following: Afebrile Awake aphasic at bedside unable to vocalize or speak, seems as if she is disturbed that she cannot speak and is aware of the same.  Follows commands and is oriented.  Code stroke activated due to his new aphasia. Vitals:   04/30/24 1530 04/30/24 1623 04/30/24 1630 04/30/24 1715  BP:  (!) 147/114 (!) 174/71 (!) 170/68  Pulse: 89 91 87 89  Temp:  97.8 F (36.6 C)    Resp: 19 (!) 23 20 16   SpO2: 97% 100% 98% 100%  TempSrc:  Oral    >>ED evaluation thus far shows: Initial CMP showing sodium 129 chloride 93 glucose 322 magnesium  1.6 replaced albumin 2.8 normal LFTs normal kidney function. CBC shows normal white count normal hemoglobin MCV of 77.4 platelets of 406. Urinalysis ordered and pending. EKG shows sinus rhythm at 77 PR 136 QTc 437, no ST-T wave changes.  >>While in the ED patient received the following: Rocephin , azithromycin .  LR half liter.  Magnesium  replacement. Review of Systems  Musculoskeletal:  Positive for falls.   Past Medical History:  Diagnosis Date   Diabetes mellitus without complication (HCC)    Hypercholesteremia    Hypertension    TIA (transient ischemic attack) 01/03/2021   Tremor    right arm   Past Surgical History:  Procedure Laterality Date   ABDOMINAL HYSTERECTOMY     APPENDECTOMY     BACK SURGERY      BLADDER SUSPENSION     CHOLECYSTECTOMY     EYE SURGERY     TONSILLECTOMY      reports that she has never smoked. She has never used smokeless tobacco. She reports that she does not drink alcohol  and does not use drugs. Allergies  Allergen Reactions   Doxycycline Hyclate Nausea And Vomiting   Levofloxacin Nausea And Vomiting   Meloxicam Nausea And Vomiting   Naproxen-Esomeprazole Mg Nausea And Vomiting   Nitrofurantoin Nausea And Vomiting   Esomeprazole Other (See Comments)    No reaction listed on MAR   Naproxen Other (See Comments)    No reaction listed on MAR   Other     Pt states she is allergic to meds but does not know names   Pneumococcal Polysaccharide Vaccine Itching   Family History  Problem Relation Age of Onset   CAD Father    Prior to Admission medications   Medication Sig Start Date End Date Taking? Authorizing Provider  acetaminophen  (TYLENOL ) 325 MG tablet Take 2 tablets (650 mg total) by mouth every 6 (six) hours as needed. Patient taking differently: Take 650 mg by mouth every 6 (six) hours as needed for mild pain (pain score 1-3) or moderate pain (pain score 4-6). 12/06/21  Yes Elnor Jayson LABOR, DO  Alogliptin-metFORMIN  HCl (KAZANO) 12.02-999 MG TABS Take 1 tablet by mouth 2 (two) times  daily.   Yes [provider]  amLODipine -benazepril  (LOTREL) 5-20 MG capsule Take 1 capsule by mouth at bedtime.   Yes [provider]  aspirin  EC 81 MG tablet Take 1 tablet (81 mg total) by mouth daily. Swallow whole. 12/05/22  Yes Lue Elsie BROCKS, MD  atorvastatin  (LIPITOR) 40 MG tablet Take 1 tablet (40 mg total) by mouth daily. Patient taking differently: Take 40 mg by mouth at bedtime. 02/08/21  Yes McCue, Harlene, NP  Carboxymethylcellulose Sodium (THERATEARS OP) Apply 1 drop to eye 3 (three) times daily.   Yes [provider]  Cholecalciferol (VITAMIN D3) 50 MCG (2000 UT) TABS Take 1 tablet by mouth daily. 01/01/23  Yes [provider]   escitalopram  (LEXAPRO ) 5 MG tablet Take 5 mg by mouth at bedtime. 01/26/23  Yes [provider]  insulin  aspart (NOVOLOG ) 100 UNIT/ML FlexPen Inject 4-5 Units into the skin See admin instructions. Inject 5 units subcutaneously three times daily with meals. Hold if BS < 100 or if resident is not eating. In addition, inject 4 units three times daily with meals as needed for BS > 350.   Yes [provider]  insulin  glargine-yfgn (SEMGLEE ) 100 UNIT/ML Pen Inject 6 Units into the skin at bedtime.   Yes [provider]  Melatonin 3 MG TBDP Take 3 mg by mouth at bedtime.   Yes [provider]  mineral oil-hydrophilic petrolatum (AQUAPHOR) ointment Apply 1 Application topically 2 (two) times daily.   Yes [provider]  Multiple Vitamin (DAILY VITE) TABS Take 1 tablet by mouth daily.   Yes [provider]  pregabalin  (LYRICA ) 100 MG capsule Take 1 capsule (100 mg total) by mouth 2 (two) times daily. 05/23/23  Yes Armenta Canning, MD  propranolol  (INDERAL ) 10 MG tablet Take 10 mg by mouth See admin instructions. Give 1 tablet (10mg ) by mouth twice daily for essential tremor. Hold for HR < 55 or BP < 90/60. 01/05/21  Yes [provider]  diclofenac  Sodium (VOLTAREN ) 1 % GEL Apply 2 g topically 2 (two) times daily.    [provider]  hydrOXYzine  (VISTARIL ) 25 MG capsule Take 25 mg by mouth 3 (three) times daily. 03/04/24   [provider]  lidocaine  (LIDODERM ) 5 % Place 1 patch onto the skin daily. Remove & Discard patch within 12 hours or as directed by MD 04/13/24   Melvenia Motto, MD  meclizine  (ANTIVERT ) 12.5 MG tablet Take 12.5 mg by mouth 3 (three) times daily as needed for dizziness or nausea. 04/22/24   [provider]  methocarbamol  (ROBAXIN ) 500 MG tablet Take 1 tablet (500 mg total) by mouth every 8 (eight) hours as needed for muscle spasms. 04/26/24   Jadine Toribio SQUIBB, MD  oxyCODONE  (OXY IR/ROXICODONE ) 5 MG immediate  release tablet Take 0.5-1 tablets (2.5-5 mg total) by mouth every 8 (eight) hours as needed for severe pain (pain score 7-10). 04/26/24   Jadine Toribio SQUIBB, MD  SENNA-TABS 8.6 MG tablet Take 1 tablet by mouth every other day. 04/09/24   [provider]  Vitamin D , Ergocalciferol , (DRISDOL) 1.25 MG (50000 UNIT) CAPS capsule Take 50,000 Units by mouth once a week. 04/14/24   [provider]  Vitals:   04/30/24 1530 04/30/24 1623 04/30/24 1630 04/30/24 1715  BP:  (!) 147/114 (!) 174/71 (!) 170/68  Pulse: 89 91 87 89  Resp: 19 (!) 23 20 16   Temp:  97.8 F (36.6 C)    TempSrc:  Oral    SpO2: 97% 100% 98% 100%   Physical Exam Vitals reviewed.  Constitutional:      General: She is not in acute distress.    Appearance: She is not ill-appearing.  HENT:     Head: Normocephalic.      Right Ear: External ear normal.     Left Ear: External ear normal.  Eyes:     Extraocular Movements: Extraocular movements intact.     Pupils: Pupils are equal, round, and reactive to light.  Cardiovascular:     Rate and Rhythm: Normal rate and regular rhythm.     Pulses: Normal pulses.     Heart sounds: Normal heart sounds.  Pulmonary:     Effort: Pulmonary effort is normal.     Breath sounds: Normal breath sounds.  Abdominal:     General: Abdomen is flat. There is no distension.     Palpations: Abdomen is soft.     Tenderness: There is no abdominal tenderness.  Musculoskeletal:        General: Swelling present.  Neurological:     General: No focal deficit present.     Mental Status: She is alert and oriented to person, place, and time.     Cranial Nerves: Cranial nerve deficit present.     Labs on Admission: I have personally reviewed following labs and imaging studies CBC: Recent Labs  Lab 04/24/24 0338 04/30/24 0625 04/30/24 0631 04/30/24 1024 04/30/24 1027  WBC 9.4 9.9  --  9.8  --   NEUTROABS   --   --   --  6.6  --   HGB 11.2* 12.0 13.6 12.2 13.3  HCT 35.0* 35.7* 40.0 37.0 39.0  MCV 80.1 77.4*  --  77.6*  --   PLT 261 406*  --  344  --    Basic Metabolic Panel: Recent Labs  Lab 04/30/24 0625 04/30/24 0631 04/30/24 1024 04/30/24 1027  NA 129* 129* 129* 130*  K 4.3 4.4 4.3 4.3  CL 93* 95* 95* 97*  CO2 24  --  23  --   GLUCOSE 322* 328* 295* 299*  BUN 18 21 16 18   CREATININE 0.81 0.70 0.68 0.70  CALCIUM  9.5  --  9.2  --   MG 1.6*  --   --   --    GFR: Estimated Creatinine Clearance: 37.1 mL/min (by C-G formula based on SCr of 0.7 mg/dL). Liver Function Tests: Recent Labs  Lab 04/30/24 0625 04/30/24 1024  AST 26 25  ALT 23 20  ALKPHOS 148* 146*  BILITOT 0.9 0.9  PROT 6.0* 5.9*  ALBUMIN 2.8* 2.8*   No results for input(s): LIPASE, AMYLASE in the last 168 hours. No results for input(s): AMMONIA in the last 168 hours. Coagulation Profile: Recent Labs  Lab 04/30/24 0625 04/30/24 1024  INR 1.0 1.0   Cardiac Enzymes: No results for input(s): CKTOTAL, CKMB, CKMBINDEX, TROPONINI in the last 168 hours. BNP (last 3 results) No results for input(s): PROBNP in the last 8760 hours. HbA1C: No results for input(s): HGBA1C in the last 72 hours. CBG: Recent Labs  Lab 04/25/24 1147 04/25/24 2143 04/26/24 0810 04/26/24 1146 04/30/24 1022  GLUCAP 198* 309* 216* 219* 279*   Lipid Profile:  No results for input(s): CHOL, HDL, LDLCALC, TRIG, CHOLHDL, LDLDIRECT in the last 72 hours. Thyroid Function Tests: No results for input(s): TSH, T4TOTAL, FREET4, T3FREE, THYROIDAB in the last 72 hours. Anemia Panel: No results for input(s): VITAMINB12, FOLATE, FERRITIN, TIBC, IRON, RETICCTPCT in the last 72 hours. Urine analysis:    Component Value Date/Time   COLORURINE STRAW (A) 04/30/2024 1800   APPEARANCEUR CLEAR 04/30/2024 1800   LABSPEC 1.006 04/30/2024 1800   PHURINE 7.0 04/30/2024 1800   GLUCOSEU >=500 (A)  04/30/2024 1800   HGBUR NEGATIVE 04/30/2024 1800   BILIRUBINUR NEGATIVE 04/30/2024 1800   KETONESUR 5 (A) 04/30/2024 1800   PROTEINUR NEGATIVE 04/30/2024 1800   NITRITE NEGATIVE 04/30/2024 1800   LEUKOCYTESUR NEGATIVE 04/30/2024 1800   Radiological Exams on Admission: CT HEAD CODE STROKE WO CONTRAST Result Date: 04/30/2024 CLINICAL DATA:  Code stroke. Neuro deficit, acute, stroke suspected. Speech difficulty. Fall. EXAM: CT HEAD WITHOUT CONTRAST TECHNIQUE: Contiguous axial images were obtained from the base of the skull through the vertex without intravenous contrast. RADIATION DOSE REDUCTION: This exam was performed according to the departmental dose-optimization program which includes automated exposure control, adjustment of the mA and/or kV according to patient size and/or use of iterative reconstruction technique. COMPARISON:  Head CT earlier today and MRI 01/04/2021 FINDINGS: Brain: There is no evidence of an acute infarct, intracranial hemorrhage, mass, midline shift, or extra-axial fluid collection. There is mild cerebral atrophy. Cerebral white matter hypodensities are unchanged from today's earlier CT and are nonspecific but compatible with mild chronic small vessel ischemic disease. Vascular: Calcified atherosclerosis at the skull base. No hyperdense vessel. Skull: No acute fracture or suspicious lesion. Sinuses/Orbits: Mild mucosal thickening in the left maxillary sinus. Clear mastoid air cells. Bilateral cataract extraction. Other: Left supraorbital hematoma as noted earlier. ASPECTS ([place] Stroke Program Early CT Score) - Ganglionic level infarction (caudate, lentiform nuclei, internal capsule, insula, M1-M3 cortex): 7 - Supraganglionic infarction (M4-M6 cortex): 3 Total score (0-10 with 10 being normal): 10 These results were communicated to Dr. Merrianne at 10:40 am on 04/30/2024 by text page via the [facility] messaging system. IMPRESSION: 1. No evidence of acute intracranial abnormality.  ASPECTS of 10. 2. Mild chronic small vessel ischemic disease. Electronically Signed   By: Dasie Hamburg M.D.   On: 04/30/2024 10:40   CT CHEST ABDOMEN PELVIS WO CONTRAST Result Date: 04/30/2024 CLINICAL DATA:  Status post fall from standing with head injury. EXAM: CT CHEST, ABDOMEN AND PELVIS WITHOUT CONTRAST TECHNIQUE: Multidetector CT imaging of the chest, abdomen and pelvis was performed following the standard protocol without IV contrast. RADIATION DOSE REDUCTION: This exam was performed according to the departmental dose-optimization program which includes automated exposure control, adjustment of the mA and/or kV according to patient size and/or use of iterative reconstruction technique. COMPARISON:  04/22/2024 FINDINGS: CT CHEST FINDINGS Cardiovascular: Heart size upper normal. No substantial pericardial effusion. Moderate atherosclerotic calcification is noted in the wall of the thoracic aorta. Mediastinum/Nodes: No mediastinal lymphadenopathy. 2.8 cm right thyroid nodule evident. This has been evaluated on previous imaging. (ref: J Am Coll Radiol. 2015 Feb;12(2): 143-50).No evidence for gross hilar lymphadenopathy although assessment is limited by the lack of intravenous contrast on the current study. Moderate hiatal hernia. The esophagus has normal imaging features. The There is no axillary lymphadenopathy. Lungs/Pleura: 6 mm left upper lobe nodule identified on 54/5. Fine architectural detail of lung parenchyma obscured by breathing motion. Ground-glass opacity identified posterior left upper lobe with left lower lobe collapse/consolidation. Moderate left pleural effusion  is progressive in the interval, potentially with some loculation towards the apex. Musculoskeletal: Multiple left-sided rib fractures noted. Patient had apparently acute fractures of the lateral left sixth and seventh ribs previously. There is now an acute fracture in the left fifth rib suggesting interval re-injury. There is a new  fracture in the posterior left sixth rib and probably a new fracture in the posterior left seventh rib. Fracture of the posterior left eighth ninth, and tenth ribs appear nonacute. New acute fracture identified in the posterior left tenth rib. No evidence for sternal or thoracic spine fracture CT ABDOMEN PELVIS FINDINGS Hepatobiliary: No suspicious focal abnormality in the liver on this study without intravenous contrast. Gallbladder is surgically absent. No intrahepatic or extrahepatic biliary dilation. Pancreas: No focal mass lesion. No dilatation of the main duct. No intraparenchymal cyst. No peripancreatic edema. Spleen: No splenomegaly. No suspicious focal mass lesion. Adrenals/Urinary Tract: No adrenal nodule or mass. Kidneys unremarkable. No evidence for hydroureter. The urinary bladder appears normal for the degree of distention. Stomach/Bowel: Moderate to large hiatal hernia. Duodenum is normally positioned as is the ligament of Treitz. No small bowel wall thickening. No small bowel dilatation. The terminal ileum is normal. No gross colonic mass. No colonic wall thickening. Diverticular changes are noted in the left colon without evidence of diverticulitis. Vascular/Lymphatic: There is moderate atherosclerotic calcification of the abdominal aorta without aneurysm. There is no gastrohepatic or hepatoduodenal ligament lymphadenopathy. No retroperitoneal or mesenteric lymphadenopathy. No pelvic sidewall lymphadenopathy. Reproductive: The uterus is surgically absent. There is no adnexal mass. Other: No intraperitoneal free fluid. Musculoskeletal: No worrisome lytic or sclerotic osseous abnormality. No evidence for an acute fracture in the bony pelvis or lumbar spine. IMPRESSION: 1. Multiple left-sided rib fractures of varying chronicity. Fractures in the left fifth, sixth, seventh, and tenth ribs appear new since the 04/22/2024 exam. 2. Moderate left pleural effusion is progressive in the interval, potentially  with some loculation towards the apex. No pneumothorax. 3. Left lower lobe collapse/consolidation with ground-glass opacity in the posterior left upper lobe. Imaging features could be related to atelectasis or pneumonia. 4. 6 mm left upper lobe pulmonary nodule. Multiple additional left upper lobe pulmonary nodule seen previously are obscured by collapse/consolidative disease on the current study. Follow-up warranted. 5. Moderate to large hiatal hernia. 6. Left colonic diverticulosis without diverticulitis. 7.  Aortic Atherosclerosis (ICD10-I70.0). Electronically Signed   By: Camellia Candle M.D.   On: 04/30/2024 06:13   DG Elbow Complete Left Result Date: 04/30/2024 EXAM: 3 VIEW(S) XRAY OF THE LEFT ELBOW COMPARISON: None available. CLINICAL HISTORY: Bruise. L fall after being given pain medication (oxycodone ). Minor abrasion on the left knee new, abrasion on the left elbow. FINDINGS: BONES AND JOINTS: No acute fracture. No focal osseous lesion. No joint dislocation. No effusion is present. SOFT TISSUES: Soft tissue swelling is present over the dorsum of the elbow. IMPRESSION: 1. No acute fracture or effusion. 2. Soft tissue swelling over the dorsum of the elbow. Electronically signed by: Lonni Necessary MD 04/30/2024 06:08 AM EDT RP Workstation: [id number]   DG Knee Left Port Result Date: 04/30/2024 EXAM: 2 VIEW(S) XRAY OF THE LEFT KNEE 04/30/2024 05:57:48 AM COMPARISON: None available. CLINICAL HISTORY: 13608 Bruise 13608. l fall after being given pain medication (oxycodone ).; Minor abrasion on the left knee new, abrasion on the left elbow FINDINGS: BONES AND JOINTS: No acute fracture. No focal osseous lesion. No joint dislocation. No significant joint effusion. Mild degenerative changes are present in the lateral compartment. SOFT TISSUES: The  soft tissues are unremarkable. Atherosclerotic calcifications are present. IMPRESSION: 1. No acute fracture or dislocation. 2. Mild degenerative changes in the  lateral compartment. Electronically signed by: Lonni Necessary MD 04/30/2024 06:08 AM EDT RP Workstation: [id number]   CT CERVICAL SPINE WO CONTRAST Result Date: 04/30/2024 EXAM: CT CERVICAL SPINE WITHOUT CONTRAST 04/30/2024 05:47:34 AM TECHNIQUE: CT of the cervical spine was performed without the administration of intravenous contrast. Multiplanar reformatted images are provided for review. Automated exposure control, iterative reconstruction, and/or weight based adjustment of the mA/kV was utilized to reduce the radiation dose to as low as reasonably achievable. COMPARISON: CT of the cervical spine 04/22/2024. CLINICAL HISTORY: Polytrauma, blunt. FINDINGS: CERVICAL SPINE: BONES AND ALIGNMENT: No acute fracture or traumatic malalignment. Cervical lordosis is preserved. DEGENERATIVE CHANGES: Soft disc protrusions again noted at C3, C3-4, C6-7 and C7-T1. Uncovertebral and osseous foraminal narrowing is again noted bilaterally at C5-6. SOFT TISSUES: No prevertebral soft tissue swelling. IMPRESSION: 1. No acute fractures. 2. Soft disc protrusions at C3, C3-4, C6-7, and C7-T1. 3. Uncovertebral and osseous foraminal narrowing bilaterally at C5-6. Electronically signed by: Lonni Necessary MD 04/30/2024 05:55 AM EDT RP Workstation: [id number]   CT HEAD WO CONTRAST Result Date: 04/30/2024 EXAM: CT HEAD WITHOUT CONTRAST 04/30/2024 05:47:34 AM TECHNIQUE: CT of the head was performed without the administration of intravenous contrast. Automated exposure control, iterative reconstruction, and/or weight based adjustment of the mA/kV was utilized to reduce the radiation dose to as low as reasonably achievable. COMPARISON: CT head without contrast 04/22/2024. CLINICAL HISTORY: Head trauma, moderate-severe. FINDINGS: BRAIN AND VENTRICLES: No acute hemorrhage. Gray-white differentiation is preserved. No hydrocephalus. No extra-axial collection. No mass effect or midline shift. Mild atrophy and white matter changes are  stable. ORBITS: Bilateral lens replacements are noted. The globes and orbits are otherwise within normal limits. SINUSES: No acute abnormality. SOFT TISSUES AND SKULL: Left supraorbital scalp hematoma is present without underlying fracture or foreign body. VASCULATURE: Atherosclerotic calcifications are present in the cavernous carotid arteries bilaterally and at the dural margin of both vertebral arteries. No hyperdense vessel is present. IMPRESSION: 1. No acute intracranial abnormality related to head trauma. 2. Left supraorbital scalp hematoma without underlying fracture or foreign body. Electronically signed by: Lonni Necessary MD 04/30/2024 05:53 AM EDT RP Workstation: [id number]   Data Reviewed: Relevant notes from primary care and specialist visits, past discharge summaries as available in EHR, including Care Everywhere . Prior diagnostic testing as pertinent to current admission diagnoses, Updated medications and problem lists for reconciliation .ED course, including vitals, labs, imaging, treatment and response to treatment,Triage notes, nursing and pharmacy notes and ED provider's notes.Notable results as noted in HPI.Discussed case with EDMD/ ED APP/ or Specialty MD on call and as needed.  Assessment & Plan  >> Fall/ Left rib fractures: Patient after her recent discharge at home with 24-hour caregiver support after a fall returns today again for a fall and is found to have multiple left-sided rib fractures of varying chronicity with new left 5th, 6th, 7th and 10th rib fractures since July 15 imaging. Incentive spirometer, pain control.  Repeat imaging for resolution and monitoring as needed.  Fall precautions .   >> Abnormal CT scan of the chest/ loculated pleural effusion/ PNA: Patient found to have moderate left pleural effusion which is increased from her previous imaging from previous admission, along with loculation towards the apex, no pneumothorax, left lower lobe collapse  consolidation, with groundglass opacity in the posterior left upper lobe, along with multiple additional left upper pulmonary nodules  and a 6 mm left upper pulmonary lobe nodule.  We have requested pulmonology consult, greatly appreciate consult and management.  Continue broad-spectrum IV antibiotics.  >> Aphasia: Code stroke activated as patient was aphasic at bedside according to the son and was speaking normally and following commands, at bedside patient is aphasic is still having left-sided weakness left upper extremity weakness along with left-sided gaze preference.  Per nurse MRI was canceled per neurology as patient's symptoms resolved immediately.  Patient is also not a candidate for thrombolytic therapy.  >> Diabetes mellitus type 2: Evaluation at bedside and a formal speech therapy consult as deemed appropriate, glycemic protocol and soft diet if patient passes swallow.  Carb consistent diet with assist.   >> Essential hypertension Vitals:   04/30/24 0508 04/30/24 0515 04/30/24 0530 04/30/24 0645  BP: (!) 131/110 (!) 178/64 (!) 196/50 (!) 172/56   04/30/24 0900 04/30/24 1320 04/30/24 1623 04/30/24 1630  BP: (!) 161/68 (!) 132/51 (!) 147/114 (!) 174/71   04/30/24 1715  BP: (!) 170/68  Patient is currently on propranolol  and Lotrel.  Will continue both.    DVT prophylaxis:  Heparin .  Consults:  None.  Advance Care Planning:    Code Status: Full Code   Family Communication:  None.  Disposition Plan:  To be determined.  Severity of Illness: The appropriate patient status for this patient is OBSERVATION. Observation status is judged to be reasonable and necessary in order to provide the required intensity of service to ensure the patient's safety. The patient's presenting symptoms, physical exam findings, and initial radiographic and laboratory data in the context of their medical condition is felt to place them at decreased risk for further clinical deterioration.  Furthermore, it is anticipated that the patient will be medically stable for discharge from the hospital within 2 midnights of admission.   Unresulted Labs (From admission, onward)     Start     Ordered   04/30/24 1902  Lipid panel  Add-on,   AD        04/30/24 1901   04/30/24 1902  Hemoglobin A1c  Add-on,   AD        04/30/24 1901   04/30/24 1901  Vitamin B12  Add-on,   AD        04/30/24 1901   04/30/24 1901  Folate  Add-on,   AD        04/30/24 1901   04/30/24 1901  TSH  Add-on,   AD        04/30/24 1901   04/30/24 1901  T4, free  Add-on,   AD        04/30/24 1901   04/30/24 0957  Strep pneumoniae urinary antigen  (COPD / Pneumonia / Cellulitis / Lower Extremity Wound (Diabetic Foot Infection))  Once,   R        04/30/24 0959            Meds ordered this encounter  Medications   cefTRIAXone  (ROCEPHIN ) 1 g in sodium chloride  0.9 % 100 mL IVPB    Antibiotic Indication::   CAP   azithromycin  (ZITHROMAX ) 500 mg in sodium chloride  0.9 % 250 mL IVPB    Antibiotic Indication::   CAP   acetaminophen  (TYLENOL ) tablet 650 mg   lidocaine  (LIDODERM ) 5 % 1 patch   lactated ringers  bolus 500 mL   magnesium  sulfate IVPB 2 g 50 mL   AND Linked Order Group    amLODipine  (NORVASC ) tablet 5 mg  benazepril  (LOTENSIN ) tablet 20 mg   aspirin  EC tablet 81 mg    Swallow whole.     atorvastatin  (LIPITOR) tablet 40 mg   escitalopram  (LEXAPRO ) tablet 5 mg   cefTRIAXone  (ROCEPHIN ) 2 g in sodium chloride  0.9 % 100 mL IVPB    Antibiotic Indication::   CAP   azithromycin  (ZITHROMAX ) 500 mg in sodium chloride  0.9 % 250 mL IVPB    Antibiotic Indication::   CAP   0.9 %  sodium chloride  infusion   OR Linked Order Group    acetaminophen  (TYLENOL ) tablet 650 mg    acetaminophen  (TYLENOL ) suppository 650 mg   morphine  (PF) 2 MG/ML injection 2 mg   docusate sodium  (COLACE) capsule 100 mg   albuterol  (PROVENTIL ) (2.5 MG/3ML) 0.083% nebulizer solution 2.5 mg   guaiFENesin  (MUCINEX ) 12 hr tablet 600  mg   DISCONTD: hydrALAZINE  (APRESOLINE ) injection 5 mg   heparin  injection 5,000 Units   sodium chloride  flush (NS) 0.9 % injection 3 mL   hydrALAZINE  (APRESOLINE ) injection 5 mg   propranolol  (INDERAL ) tablet 10 mg    Give 1 tablet (10mg ) by mouth twice daily for essential tremor. Hold for HR < 55 or BP < 120/80     Orders Placed This Encounter  Procedures   Procedural/ Surgical Case Request: CHEST TUBE INSERTION   DG Knee Left Port   DG Elbow Complete Left   CT HEAD WO CONTRAST   CT CERVICAL SPINE WO CONTRAST   CT CHEST ABDOMEN PELVIS WO CONTRAST   CT HEAD CODE STROKE WO CONTRAST   DG CHEST PORT 1 VIEW   Comprehensive metabolic panel   CBC   Urinalysis, Routine w reflex microscopic -Urine, Clean Catch   Protime-INR   Magnesium    Procalcitonin   Strep pneumoniae urinary antigen   CBC   Protime-INR   APTT   Differential   Comprehensive metabolic panel   Ethanol   Vitamin B12   Folate   TSH   T4, free   Lipid panel   Hemoglobin A1c   Diet NPO time specified   Apply ice to affected area (if injury is <48 hours old)   Immobilize affected extremity   Remove jewelry   ED Cardiac monitoring   Measure blood pressure   Initiate Carrier Fluid Protocol   Apply Pneumonia Care Plan   Cardiac Monitoring Continuous x 24 hours Indications for use: Other; other indications for use: PNA   Vital signs   Notify physician (specify)   Mobility Protocol: No Restrictions RN to initiate protocols based on patient's level of care   Refer to Sidebar Report Refer to ICU, Med-Surg, Progressive, and Step-Down Mobility Protocol Sidebars   Initiate Adult Central Line Maintenance and Catheter Protocol for patients with central line (CVC, PICC, Port, Hemodialysis, Trialysis)   If patient diabetic or glucose greater than 140 notify physician for Sliding Scale Insulin  Orders   Intake and Output   Do not place and if present remove PureWick   Initiate Oral Care Protocol   Initiate Carrier Fluid  Protocol   RN may order General Admission PRN Orders utilizing General Admission PRN medications (through manage orders) for the following patient needs: allergy symptoms (Claritin), cold sores (Carmex), cough (Robitussin DM), eye irritation (Liquifilm Tears), hemorrhoids (Tucks), indigestion (Maalox), minor skin irritation (Hydrocortisone Cream), muscle pain Lucienne Gay), nose irritation (saline nasal spray) and sore throat (Chloraseptic spray).   ED Cardiac monitoring   NIH Stroke Scale   Saline Lock IV, Maintain IV access  If O2 sat <94% Administer O2 @ 2 Liters/Minute   Neuro checks   Full code   Consult to hospitalist   Consult to pulmonology Consult Timeframe: ROUTINE - requires response within 24 hours; Reason for Consult? loculated pleural effusion / consolidation/ collapse and X pulmonary nodule.   Consult to respiratory care treatment (RT)   Clerk to Activate Code Stroke   OT eval and treat   PT eval and treat   ED Pulse oximetry, continuous   Pulse oximetry check with vital signs   Oxygen therapy Mode or (Route): Nasal cannula; Liters Per Minute: 2; Keep O2 saturation between: greater than 92 %   Incentive spirometry   ED Pulse oximetry, continuous   I-Stat Chem 8, ED   I-stat chem 8, ED   CBG monitoring, ED   ED EKG   EKG 12-Lead   Admit to Inpatient (patient's expected length of stay will be greater than 2 midnights or inpatient only procedure)   Admit to Inpatient (patient's expected length of stay will be greater than 2 midnights or inpatient only procedure)    Author: Mario LULLA Blanch, MD 12 pm- 8 pm. [facility]. 04/30/2024 7:13 PM Please note for any communication after hours contact [facility] Assigned provider on call on Amion.

## 2024-04-30 NOTE — ED Triage Notes (Addendum)
 EMS reports patient had a mechanical fall after being given pain medication (oxycodone ). Patient hit her head on the floor, no carpet. Patient has bruising on her bilateral arm and chest wall, that is old.  Minor abrasion on the left knee new, abrasion on the left elbow that is new, laceration to the right hand is also new.   No thinners. No LOC

## 2024-04-30 NOTE — Inpatient Diabetes Management (Signed)
 Inpatient Diabetes Program Recommendations  AACE/ADA: New Consensus Statement on Inpatient Glycemic Control (2015)  Target Ranges:  Prepandial:   less than 140 mg/dL      Peak postprandial:   less than 180 mg/dL (1-2 hours)      Critically ill patients:  140 - 180 mg/dL   Lab Results  Component Value Date   GLUCAP 279 (H) 04/30/2024   HGBA1C 8.3 (H) 02/25/2024    Review of Glycemic Control  Latest Reference Range & Units 04/30/24 10:22  Glucose-Capillary 70 - 99 mg/dL 720 (H)  (H): Data is abnormally high  Diabetes history: DM2 Outpatient Diabetes medications: Kazano (alogliptin-Metformin ) 12.02-999 mg BID, Novolog  5 units TID, Lantus  10 units QD Current orders for Inpatient glycemic control: none  Inpatient Diabetes Program Recommendations:    Semglee  4 units every day Novolog  0-9 units TID  Thank you, Wyvonna Pinal, MSN, CDCES Diabetes Coordinator Inpatient Diabetes Program [phone] (team pager from 8a-5p)

## 2024-04-30 NOTE — ED Notes (Signed)
 Awaiting verification for 1000 meds

## 2024-04-30 NOTE — Consult Note (Signed)
 NEUROLOGY CONSULT NOTE   Date of service: April 30, 2024 Patient Name: Kiara Blair MRN:  [id number] DOB:  1930-04-24 Chief Complaint: s/p fall Requesting Provider: Tobie Mario GAILS, MD  History of Present Illness  Kiara Blair is a 89 y.o. female with hx of HTN, DM, VA, HLD, Anxiety, Tremor who was BIB EMS s/p fall at home. On EDP assessment, patient had no focal weakness and caregiver stated she was at her usual baseline with some confusion. On Hospitalist assessment, patient exhibited aphasia, left-sided weakness and aphasia and an inpatient CODE STROKE was called.  On neurology assessment, patient was drowsy, oriented to self, age, confused to place and month/year. She had mild aphasia, increasing with continued questioning so may be more connected to drowsiness and poor attention. No focal weakness or sensory deficit noted.   She was admitted to the hospital a week ago after a fall at home.  During this previous fall, she sustained rib fracture and trace pneumothorax.  She was discharged 4 days ago.  She was prescribed as needed Roxicodone .  She now has 24-hour caregiver support.   LKW: 1000 Modified rankin score: 4-Needs assistance to walk and tend to bodily needs IV Thrombolysis: No, not a candidate due to recent fall with head trauma EVT: No, no LVO suspected  NIHSS components Score: Comment  1a Level of Conscious 0[x]  1[]  2[]  3[]      1b LOC Questions 0[]  1[x]  2[]       1c LOC Commands 0[x]  1[]  2[]       2 Best Gaze 0[x]  1[]  2[]       3 Visual 0[x]  1[]  2[]  3[]      4 Facial Palsy 0[x]  1[]  2[]  3[]      5a Motor Arm - left 0[x]  1[]  2[]  3[]  4[]  UN[]    5b Motor Arm - Right 0[x]  1[]  2[]  3[]  4[]  UN[]    6a Motor Leg - Left 0[x]  1[]  2[]  3[]  4[]  UN[]    6b Motor Leg - Right 0[x]  1[]  2[]  3[]  4[]  UN[]    7 Limb Ataxia 0[x]  1[]  2[]  UN[]      8 Sensory 0[x]  1[]  2[]  UN[]      9 Best Language 0[]  1[x]  2[]  3[]      10 Dysarthria 0[x]  1[]  2[]  UN[]      11 Extinct. and Inattention 0[x]  1[]  2[]       TOTAL:    2      ROS   Unable to ascertain due to AMS  Past History   Past Medical History:  Diagnosis Date   Diabetes mellitus without complication (HCC)    Hypercholesteremia    Hypertension    TIA (transient ischemic attack) 01/03/2021   Tremor    right arm    Past Surgical History:  Procedure Laterality Date   ABDOMINAL HYSTERECTOMY     APPENDECTOMY     BACK SURGERY     BLADDER SUSPENSION     CHOLECYSTECTOMY     EYE SURGERY     TONSILLECTOMY      Family History: Family History  Problem Relation Age of Onset   CAD Father     Social History  reports that she has never smoked. She has never used smokeless tobacco. She reports that she does not drink alcohol  and does not use drugs.  Allergies  Allergen Reactions   Doxycycline Hyclate Nausea And Vomiting   Levofloxacin Nausea And Vomiting   Meloxicam Nausea And Vomiting   Naproxen-Esomeprazole Mg Nausea And Vomiting   Nitrofurantoin Nausea And Vomiting   Other  Pt states she is allergic to meds but does not know names   Pneumococcal Polysaccharide Vaccine Itching    Medications   Current Facility-Administered Medications:    0.9 %  sodium chloride  infusion, , Intravenous, Once, Tobie, Mario GAILS, MD   acetaminophen  (TYLENOL ) tablet 650 mg, 650 mg, Oral, Q6H PRN **OR** acetaminophen  (TYLENOL ) suppository 650 mg, 650 mg, Rectal, Q6H PRN, Tobie Mario GAILS, MD   albuterol  (PROVENTIL ) (2.5 MG/3ML) 0.083% nebulizer solution 2.5 mg, 2.5 mg, Nebulization, Q2H PRN, Tobie Mario GAILS, MD   amLODipine  (NORVASC ) tablet 5 mg, 5 mg, Oral, Daily **AND** benazepril  (LOTENSIN ) tablet 20 mg, 20 mg, Oral, Daily, Tobie, Ekta V, MD   aspirin  EC tablet 81 mg, 81 mg, Oral, Daily, Patel, Mario GAILS, MD   atorvastatin  (LIPITOR) tablet 40 mg, 40 mg, Oral, QHS, Patel, Ekta V, MD   [START ON 05/01/2024] azithromycin  (ZITHROMAX ) 500 mg in sodium chloride  0.9 % 250 mL IVPB, 500 mg, Intravenous, Q24H, Tobie, Mario GAILS, MD   [START ON 05/01/2024] cefTRIAXone   (ROCEPHIN ) 2 g in sodium chloride  0.9 % 100 mL IVPB, 2 g, Intravenous, Q24H, Patel, Ekta V, MD   docusate sodium  (COLACE) capsule 100 mg, 100 mg, Oral, Daily PRN, Tobie Mario GAILS, MD   escitalopram  (LEXAPRO ) tablet 5 mg, 5 mg, Oral, QHS, Patel, Ekta V, MD   guaiFENesin  (MUCINEX ) 12 hr tablet 600 mg, 600 mg, Oral, BID PRN, Tobie Mario GAILS, MD   heparin  injection 5,000 Units, 5,000 Units, Subcutaneous, Q8H, Patel, Ekta V, MD   hydrALAZINE  (APRESOLINE ) injection 5 mg, 5 mg, Intravenous, Q4H PRN, Tobie Mario GAILS, MD   lidocaine  (LIDODERM ) 5 % 1 patch, 1 patch, Transdermal, Q24H, Melvenia Motto, MD, 1 patch at 04/30/24 9350   morphine  (PF) 2 MG/ML injection 2 mg, 2 mg, Intravenous, Q2H PRN, Tobie Mario GAILS, MD   sodium chloride  flush (NS) 0.9 % injection 3 mL, 3 mL, Intravenous, Once, Tobie Mario GAILS, MD  Current Outpatient Medications:    acetaminophen  (TYLENOL ) 325 MG tablet, Take 2 tablets (650 mg total) by mouth every 6 (six) hours as needed. (Patient taking differently: Take 650 mg by mouth every 6 (six) hours as needed for mild pain (pain score 1-3) or moderate pain (pain score 4-6).), Disp: 36 tablet, Rfl: 0   Alogliptin-metFORMIN  HCl (KAZANO) 12.02-999 MG TABS, Take 1 tablet by mouth 2 (two) times daily., Disp: , Rfl:    amLODipine -benazepril  (LOTREL) 5-20 MG capsule, Take 1 capsule by mouth at bedtime., Disp: , Rfl:    aspirin  EC 81 MG tablet, Take 1 tablet (81 mg total) by mouth daily. Swallow whole., Disp: 30 tablet, Rfl: 12   atorvastatin  (LIPITOR) 40 MG tablet, Take 1 tablet (40 mg total) by mouth daily. (Patient taking differently: Take 40 mg by mouth at bedtime.), Disp: 90 tablet, Rfl: 3   Carboxymethylcellulose Sodium (THERATEARS OP), Apply 1 drop to eye 3 (three) times daily., Disp: , Rfl:    Cholecalciferol (VITAMIN D3) 50 MCG (2000 UT) TABS, Take 1 tablet by mouth daily., Disp: , Rfl:    escitalopram  (LEXAPRO ) 5 MG tablet, Take 5 mg by mouth at bedtime., Disp: , Rfl:    insulin  aspart (NOVOLOG )  100 UNIT/ML FlexPen, Inject 4-5 Units into the skin See admin instructions. Inject 5 units subcutaneously three times daily with meals. Hold if BS < 100 or if resident is not eating. In addition, inject 4 units three times daily with meals as needed for BS > 350., Disp: , Rfl:  insulin  glargine-yfgn (SEMGLEE ) 100 UNIT/ML Pen, Inject 6 Units into the skin at bedtime., Disp: , Rfl:    Melatonin 3 MG TBDP, Take 3 mg by mouth at bedtime., Disp: , Rfl:    mineral oil-hydrophilic petrolatum (AQUAPHOR) ointment, Apply 1 Application topically 2 (two) times daily., Disp: , Rfl:    Multiple Vitamin (DAILY VITE) TABS, Take 1 tablet by mouth daily., Disp: , Rfl:    pregabalin  (LYRICA ) 100 MG capsule, Take 1 capsule (100 mg total) by mouth 2 (two) times daily., Disp: 60 capsule, Rfl: 0   propranolol  (INDERAL ) 10 MG tablet, Take 10 mg by mouth See admin instructions. Give 1 tablet (10mg ) by mouth twice daily for essential tremor. Hold for HR < 55 or BP < 90/60., Disp: , Rfl:    diclofenac  Sodium (VOLTAREN ) 1 % GEL, Apply 2 g topically 2 (two) times daily., Disp: , Rfl:    hydrOXYzine  (VISTARIL ) 25 MG capsule, Take 25 mg by mouth 3 (three) times daily., Disp: , Rfl:    lidocaine  (LIDODERM ) 5 %, Place 1 patch onto the skin daily. Remove & Discard patch within 12 hours or as directed by MD, Disp: 9 patch, Rfl: 0   meclizine  (ANTIVERT ) 12.5 MG tablet, Take 12.5 mg by mouth 3 (three) times daily as needed for dizziness or nausea., Disp: , Rfl:    methocarbamol  (ROBAXIN ) 500 MG tablet, Take 1 tablet (500 mg total) by mouth every 8 (eight) hours as needed for muscle spasms., Disp: 12 tablet, Rfl: 0   oxyCODONE  (OXY IR/ROXICODONE ) 5 MG immediate release tablet, Take 0.5-1 tablets (2.5-5 mg total) by mouth every 8 (eight) hours as needed for severe pain (pain score 7-10)., Disp: 12 tablet, Rfl: 0   SENNA-TABS 8.6 MG tablet, Take 1 tablet by mouth every other day., Disp: , Rfl:    Vitamin D , Ergocalciferol , (DRISDOL) 1.25  MG (50000 UNIT) CAPS capsule, Take 50,000 Units by mouth once a week., Disp: , Rfl:   Vitals   Vitals:   04/30/24 0530 04/30/24 0645 04/30/24 0900 04/30/24 0913  BP: (!) 196/50 (!) 172/56 (!) 161/68   Pulse: 78 77 77   Resp: 20 18 (!) 23   Temp:    98 F (36.7 C)  TempSrc:    Oral  SpO2: 96% 97% 97%     There is no height or weight on file to calculate BMI.   Physical Exam   Constitutional: elderly, appears disheveled Head: Normocephalic.  Cardiovascular: Normal rate and regular rhythm.  Respiratory: Effort normal, non-labored breathing. Room air.  Skin: Multiple bruises. Fresh hematoma to left forehead and left periorbital bruising.   Neurologic Examination   Mental Status: Patient is drowsy but alert, oriented to person, age. Disoriented to place, month/year.  Mild aphasia that increases with continued conversation, seemingly correlating to patient's level of drowsiness.  Cranial Nerves: II: Pupils are equal, round, and reactive to light. Blinks to threat bilaterally.  III,IV, VI: EOMI. Swelling noted to L eye, unable to fully open due to this.  V: Facial sensation is symmetric to light touch VII: Facial movement is symmetric. Slight nasolabial fold flattening seen at rest, but not noted on movement.  VIII: hearing is intact to voice X: Palate elevates symmetrically XI: Shoulder shrug is symmetric. XII: tongue is midline  Motor: Tone is normal. Bulk is normal.  5/5 strength was present in all four extremities.  Sensory: Sensation is symmetric to light touch in the arms and legs. Cerebellar: FNF slow but intact bilaterally, Baseline  UE tremors present.    Labs/Imaging/Neurodiagnostic studies   CBC:  Recent Labs  Lab 05/17/24 0625 2024/05/17 0631 05-17-2024 1024 May 17, 2024 1027  WBC 9.9  --  9.8  --   NEUTROABS  --   --  6.6  --   HGB 12.0   < > 12.2 13.3  HCT 35.7*   < > 37.0 39.0  MCV 77.4*  --  77.6*  --   PLT 406*  --  344  --    < > = values in this  interval not displayed.   Basic Metabolic Panel:  Lab Results  Component Value Date   NA 130 (L) 05/17/2024   K 4.3 05/17/24   CO2 24 05-17-2024   GLUCOSE 299 (H) 05-17-24   BUN 18 17-May-2024   CREATININE 0.70 May 17, 2024   CALCIUM  9.5 05-17-2024   GFRNONAA >60 2024-05-17   GFRAA >60 05/01/2020   Lipid Panel:  Lab Results  Component Value Date   LDLCALC 69 11/29/2022   HgbA1c:  Lab Results  Component Value Date   HGBA1C 8.3 (H) 02/25/2024   Urine Drug Screen:     Component Value Date/Time   LABOPIA NONE DETECTED 01/03/2021 1533   COCAINSCRNUR NONE DETECTED 01/03/2021 1533   LABBENZ NONE DETECTED 01/03/2021 1533   AMPHETMU NONE DETECTED 01/03/2021 1533   THCU NONE DETECTED 01/03/2021 1533   LABBARB NONE DETECTED 01/03/2021 1533    Alcohol  Level No results found for: [facility] INR  Lab Results  Component Value Date   INR 1.0 05-17-2024   APTT  Lab Results  Component Value Date   APTT 30 05/17/24    CODE STROKE CT Head without contrast (Personally reviewed): No evidence of acute intracranial abnormality. ASPECTS of 10. Mild chronic small vessel ischemic disease.    ASSESSMENT  Kiara Blair is a 89 y.o. female with hx of HTN, DM, VA, HLD, Anxiety, Tremor who was BIB EMS s/p fall at home. Initially had a CTH that was negative for bleed. However, patient had aphasia and left-sided weakness later while being assessed by hospitalist and CODE STROKE was activated.  - On exam the patient is drowsy, confused and with mild aphasia that worsens with continued questioning, as well as poor attention. No focal weakness or sensory deficit noted.  - Falls with head injury can cause delirium in elderly patients, even if they do not have a dementia diagnosis. Delirium in an elderly patient can also suggest an underlying dementia, which likely needs to be further evaluated in this patient as she was noted to be confused at baseline by her caretaker and has had multiple recent  falls.  - Impression: Delirium in patient with history of dementia status post fall  RECOMMENDATIONS  - Q2H neuro checks - MRI Brain - Judicious use of opiate pain medication: use only if there is a strong indication. Recommend discontinuing use of Roxicodone  that she was given at last discharge ______________________________________________________________________    Signed, Rocky JAYSON Likes, NP [facility]   I have seen and examined the patient. I have formulated the assessment and recommendations. 89 y.o. female with a PMHx of HTN, DM, VA, HLD, anxiety and tremor who was BIB EMS s/p fall at home. Initially had a CTH that was negative for bleed. However, patient was noted to have transient aphasia and left-sided weakness later while being assessed by hospitalist and CODE STROKE was activated. Exam reveals patient to be drowsy, confused and with mild aphasia that worsens with continued questioning, as well  as poor attention. No focal weakness or sensory deficit noted. CT head without acute abnormality. Not a TNK candidate due to recent fall striking head, as evidenced by left periorbital bruising. Recommendations include MRI brain.  Electronically signed: Dr. Zymir Napoli

## 2024-04-30 NOTE — ED Notes (Signed)
 CALLED TO ACTIVATE A CODE STROKE PER ADMITTING DR. PATEL

## 2024-04-30 NOTE — ED Provider Notes (Signed)
 [place] [facility] DE[facility]MENT AT [facility] Provider Note   CSN: [id number] Arrival date & time: 04/30/24  9542     Patient presents with: Kiara Blair is a 89 y.o. female.    Fall  Patient presents after a fall.  Medical history includes HTN, DM, CVA, GERD, anxiety, HLD, tremor.  She was admitted to the hospital a week ago after a fall at home.  During this previous fall, she sustained rib fracture and trace pneumothorax.  She was discharged 4 days ago.  She was prescribed as needed Roxicodone .  She now has 24-hour caregiver support.  This evening at around 9 PM, she was given a dose of oxycodone .  She received a second dose around 1 AM.  Caregiver helped her to the bathroom.  She got her back into her recliner and went to go clean up the bathroom.  While the caregiver was in the bathroom, patient had a fall out of the recliner.  This fall was unwitnessed.  She sustained a new abrasion overlying previous hematoma on her left forehead.  Caregiver reports that she is at her mental baseline. She is not on blood thinners.     Prior to Admission medications   Medication Sig Start Date End Date Taking? Authorizing Provider  acetaminophen  (TYLENOL ) 325 MG tablet Take 2 tablets (650 mg total) by mouth every 6 (six) hours as needed. Patient taking differently: Take 650 mg by mouth every 6 (six) hours as needed for mild pain (pain score 1-3) or moderate pain (pain score 4-6). 12/06/21  Yes Elnor Jayson LABOR, DO  Alogliptin-metFORMIN  HCl (KAZANO) 12.02-999 MG TABS Take 1 tablet by mouth 2 (two) times daily.   Yes [provider]  amLODipine -benazepril  (LOTREL) 5-20 MG capsule Take 1 capsule by mouth at bedtime.   Yes [provider]  aspirin  EC 81 MG tablet Take 1 tablet (81 mg total) by mouth daily. Swallow whole. 12/05/22  Yes Lue Elsie BROCKS, MD  atorvastatin  (LIPITOR) 40 MG tablet Take 1 tablet (40 mg total) by mouth daily. Patient taking differently: Take  40 mg by mouth at bedtime. 02/08/21  Yes McCue, Harlene, NP  Carboxymethylcellulose Sodium (THERATEARS OP) Apply 1 drop to eye 3 (three) times daily.   Yes [provider]  Cholecalciferol (VITAMIN D3) 50 MCG (2000 UT) TABS Take 1 tablet by mouth daily. 01/01/23  Yes [provider]  escitalopram  (LEXAPRO ) 5 MG tablet Take 5 mg by mouth at bedtime. 01/26/23  Yes [provider]  insulin  aspart (NOVOLOG ) 100 UNIT/ML FlexPen Inject 4-5 Units into the skin See admin instructions. Inject 5 units subcutaneously three times daily with meals. Hold if BS < 100 or if resident is not eating. In addition, inject 4 units three times daily with meals as needed for BS > 350.   Yes [provider]  insulin  glargine-yfgn (SEMGLEE ) 100 UNIT/ML Pen Inject 6 Units into the skin at bedtime.   Yes [provider]  Melatonin 3 MG TBDP Take 3 mg by mouth at bedtime.   Yes [provider]  mineral oil-hydrophilic petrolatum (AQUAPHOR) ointment Apply 1 Application topically 2 (two) times daily.   Yes [provider]  Multiple Vitamin (DAILY VITE) TABS Take 1 tablet by mouth daily.   Yes [provider]  pregabalin  (LYRICA ) 100 MG capsule Take 1 capsule (100 mg total) by mouth 2 (two) times daily. 05/23/23  Yes Armenta Canning, MD  propranolol  (INDERAL ) 10 MG tablet Take 10 mg  by mouth See admin instructions. Give 1 tablet (10mg ) by mouth twice daily for essential tremor. Hold for HR < 55 or BP < 90/60. 01/05/21  Yes [provider]  diclofenac  Sodium (VOLTAREN ) 1 % GEL Apply 2 g topically 2 (two) times daily.    [provider]  hydrOXYzine  (VISTARIL ) 25 MG capsule Take 25 mg by mouth 3 (three) times daily. 03/04/24   [provider]  lidocaine  (LIDODERM ) 5 % Place 1 patch onto the skin daily. Remove & Discard patch within 12 hours or as directed by MD 04/13/24   Melvenia Motto, MD  meclizine  (ANTIVERT ) 12.5 MG tablet Take 12.5 mg by mouth 3  (three) times daily as needed for dizziness or nausea. 04/22/24   [provider]  methocarbamol  (ROBAXIN ) 500 MG tablet Take 1 tablet (500 mg total) by mouth every 8 (eight) hours as needed for muscle spasms. 04/26/24   Jadine Toribio SQUIBB, MD  oxyCODONE  (OXY IR/ROXICODONE ) 5 MG immediate release tablet Take 0.5-1 tablets (2.5-5 mg total) by mouth every 8 (eight) hours as needed for severe pain (pain score 7-10). 04/26/24   Jadine Toribio SQUIBB, MD  SENNA-TABS 8.6 MG tablet Take 1 tablet by mouth every other day. 04/09/24   [provider]  Vitamin D , Ergocalciferol , (DRISDOL) 1.25 MG (50000 UNIT) CAPS capsule Take 50,000 Units by mouth once a week. 04/14/24   [provider]    Allergies: Doxycycline hyclate, Levofloxacin, Meloxicam, Naproxen-esomeprazole mg, Nitrofurantoin, Other, and Pneumococcal polysaccharide vaccine    Review of Systems  Musculoskeletal:  Positive for arthralgias and myalgias.  Skin:  Positive for wound.  All other systems reviewed and are negative.   Updated Vital Signs BP (!) 172/56   Pulse 77   Temp 98.2 F (36.8 C) (Oral)   Resp 18   SpO2 97%   Physical Exam Vitals and nursing note reviewed.  Constitutional:      General: She is not in acute distress.    Appearance: Normal appearance. She is well-developed. She is not ill-appearing, toxic-appearing or diaphoretic.  HENT:     Head: Normocephalic.     Comments: Hematoma and abrasion to left forehead.    Right Ear: External ear normal.     Left Ear: External ear normal.     Nose: Nose normal.     Mouth/Throat:     Mouth: Mucous membranes are moist.  Eyes:     Extraocular Movements: Extraocular movements intact.     Conjunctiva/sclera: Conjunctivae normal.  Cardiovascular:     Rate and Rhythm: Normal rate and regular rhythm.  Pulmonary:     Effort: Pulmonary effort is normal. No respiratory distress.     Breath sounds: Normal breath sounds.  Chest:     Chest wall: Tenderness  present.  Abdominal:     General: There is no distension.     Palpations: Abdomen is soft.     Tenderness: There is no abdominal tenderness.  Musculoskeletal:        General: Tenderness and signs of injury present. No swelling or deformity.     Cervical back: Normal range of motion and neck supple.  Skin:    General: Skin is warm and dry.     Coloration: Skin is not jaundiced or pale.  Neurological:     General: No focal deficit present.     Mental Status: She is alert. She is disoriented.  Psychiatric:        Mood and Affect: Mood normal.  Behavior: Behavior normal.     (all labs ordered are listed, but only abnormal results are displayed) Labs Reviewed  COMPREHENSIVE METABOLIC PANEL WITH GFR - Abnormal; Notable for the following components:      Result Value   Sodium 129 (*)    Chloride 93 (*)    Glucose, Bld 322 (*)    Total Protein 6.0 (*)    Albumin 2.8 (*)    Alkaline Phosphatase 148 (*)    All other components within normal limits  CBC - Abnormal; Notable for the following components:   HCT 35.7 (*)    MCV 77.4 (*)    RDW 16.0 (*)    Platelets 406 (*)    All other components within normal limits  MAGNESIUM  - Abnormal; Notable for the following components:   Magnesium  1.6 (*)    All other components within normal limits  I-STAT CHEM 8, ED - Abnormal; Notable for the following components:   Sodium 129 (*)    Chloride 95 (*)    Glucose, Bld 328 (*)    All other components within normal limits  PROTIME-INR  URINALYSIS, ROUTINE W REFLEX MICROSCOPIC    EKG: EKG Interpretation Date/Time:  Wednesday April 30 2024 06:04:04 EDT Ventricular Rate:  77 PR Interval:  136 QRS Duration:  101 QT Interval:  386 QTC Calculation: 437 R Axis:   33  Text Interpretation: Sinus rhythm Confirmed by Melvenia Motto [phone]) on 04/30/2024 6:09:21 AM  Radiology: CT CHEST ABDOMEN PELVIS WO CONTRAST Result Date: 04/30/2024 CLINICAL DATA:  Status post fall from standing with head  injury. EXAM: CT CHEST, ABDOMEN AND PELVIS WITHOUT CONTRAST TECHNIQUE: Multidetector CT imaging of the chest, abdomen and pelvis was performed following the standard protocol without IV contrast. RADIATION DOSE REDUCTION: This exam was performed according to the departmental dose-optimization program which includes automated exposure control, adjustment of the mA and/or kV according to patient size and/or use of iterative reconstruction technique. COMPARISON:  04/22/2024 FINDINGS: CT CHEST FINDINGS Cardiovascular: Heart size upper normal. No substantial pericardial effusion. Moderate atherosclerotic calcification is noted in the wall of the thoracic aorta. Mediastinum/Nodes: No mediastinal lymphadenopathy. 2.8 cm right thyroid nodule evident. This has been evaluated on previous imaging. (ref: J Am Coll Radiol. 2015 Feb;12(2): 143-50).No evidence for gross hilar lymphadenopathy although assessment is limited by the lack of intravenous contrast on the current study. Moderate hiatal hernia. The esophagus has normal imaging features. The There is no axillary lymphadenopathy. Lungs/Pleura: 6 mm left upper lobe nodule identified on 54/5. Fine architectural detail of lung parenchyma obscured by breathing motion. Ground-glass opacity identified posterior left upper lobe with left lower lobe collapse/consolidation. Moderate left pleural effusion is progressive in the interval, potentially with some loculation towards the apex. Musculoskeletal: Multiple left-sided rib fractures noted. Patient had apparently acute fractures of the lateral left sixth and seventh ribs previously. There is now an acute fracture in the left fifth rib suggesting interval re-injury. There is a new fracture in the posterior left sixth rib and probably a new fracture in the posterior left seventh rib. Fracture of the posterior left eighth ninth, and tenth ribs appear nonacute. New acute fracture identified in the posterior left tenth rib. No evidence  for sternal or thoracic spine fracture CT ABDOMEN PELVIS FINDINGS Hepatobiliary: No suspicious focal abnormality in the liver on this study without intravenous contrast. Gallbladder is surgically absent. No intrahepatic or extrahepatic biliary dilation. Pancreas: No focal mass lesion. No dilatation of the main duct. No intraparenchymal cyst. No peripancreatic  edema. Spleen: No splenomegaly. No suspicious focal mass lesion. Adrenals/Urinary Tract: No adrenal nodule or mass. Kidneys unremarkable. No evidence for hydroureter. The urinary bladder appears normal for the degree of distention. Stomach/Bowel: Moderate to large hiatal hernia. Duodenum is normally positioned as is the ligament of Treitz. No small bowel wall thickening. No small bowel dilatation. The terminal ileum is normal. No gross colonic mass. No colonic wall thickening. Diverticular changes are noted in the left colon without evidence of diverticulitis. Vascular/Lymphatic: There is moderate atherosclerotic calcification of the abdominal aorta without aneurysm. There is no gastrohepatic or hepatoduodenal ligament lymphadenopathy. No retroperitoneal or mesenteric lymphadenopathy. No pelvic sidewall lymphadenopathy. Reproductive: The uterus is surgically absent. There is no adnexal mass. Other: No intraperitoneal free fluid. Musculoskeletal: No worrisome lytic or sclerotic osseous abnormality. No evidence for an acute fracture in the bony pelvis or lumbar spine. IMPRESSION: 1. Multiple left-sided rib fractures of varying chronicity. Fractures in the left fifth, sixth, seventh, and tenth ribs appear new since the 04/22/2024 exam. 2. Moderate left pleural effusion is progressive in the interval, potentially with some loculation towards the apex. No pneumothorax. 3. Left lower lobe collapse/consolidation with ground-glass opacity in the posterior left upper lobe. Imaging features could be related to atelectasis or pneumonia. 4. 6 mm left upper lobe pulmonary  nodule. Multiple additional left upper lobe pulmonary nodule seen previously are obscured by collapse/consolidative disease on the current study. Follow-up warranted. 5. Moderate to large hiatal hernia. 6. Left colonic diverticulosis without diverticulitis. 7.  Aortic Atherosclerosis (ICD10-I70.0). Electronically Signed   By: Camellia Candle M.D.   On: 04/30/2024 06:13   DG Elbow Complete Left Result Date: 04/30/2024 EXAM: 3 VIEW(S) XRAY OF THE LEFT ELBOW COMPARISON: None available. CLINICAL HISTORY: Bruise. L fall after being given pain medication (oxycodone ). Minor abrasion on the left knee new, abrasion on the left elbow. FINDINGS: BONES AND JOINTS: No acute fracture. No focal osseous lesion. No joint dislocation. No effusion is present. SOFT TISSUES: Soft tissue swelling is present over the dorsum of the elbow. IMPRESSION: 1. No acute fracture or effusion. 2. Soft tissue swelling over the dorsum of the elbow. Electronically signed by: Lonni Necessary MD 04/30/2024 06:08 AM EDT RP Workstation: [id number]   DG Knee Left Port Result Date: 04/30/2024 EXAM: 2 VIEW(S) XRAY OF THE LEFT KNEE 04/30/2024 05:57:48 AM COMPARISON: None available. CLINICAL HISTORY: 13608 Bruise 13608. l fall after being given pain medication (oxycodone ).; Minor abrasion on the left knee new, abrasion on the left elbow FINDINGS: BONES AND JOINTS: No acute fracture. No focal osseous lesion. No joint dislocation. No significant joint effusion. Mild degenerative changes are present in the lateral compartment. SOFT TISSUES: The soft tissues are unremarkable. Atherosclerotic calcifications are present. IMPRESSION: 1. No acute fracture or dislocation. 2. Mild degenerative changes in the lateral compartment. Electronically signed by: Lonni Necessary MD 04/30/2024 06:08 AM EDT RP Workstation: [id number]   CT CERVICAL SPINE WO CONTRAST Result Date: 04/30/2024 EXAM: CT CERVICAL SPINE WITHOUT CONTRAST 04/30/2024 05:47:34 AM TECHNIQUE: CT  of the cervical spine was performed without the administration of intravenous contrast. Multiplanar reformatted images are provided for review. Automated exposure control, iterative reconstruction, and/or weight based adjustment of the mA/kV was utilized to reduce the radiation dose to as low as reasonably achievable. COMPARISON: CT of the cervical spine 04/22/2024. CLINICAL HISTORY: Polytrauma, blunt. FINDINGS: CERVICAL SPINE: BONES AND ALIGNMENT: No acute fracture or traumatic malalignment. Cervical lordosis is preserved. DEGENERATIVE CHANGES: Soft disc protrusions again noted at C3, C3-4, C6-7 and C7-T1. Uncovertebral  and osseous foraminal narrowing is again noted bilaterally at C5-6. SOFT TISSUES: No prevertebral soft tissue swelling. IMPRESSION: 1. No acute fractures. 2. Soft disc protrusions at C3, C3-4, C6-7, and C7-T1. 3. Uncovertebral and osseous foraminal narrowing bilaterally at C5-6. Electronically signed by: Lonni Necessary MD 04/30/2024 05:55 AM EDT RP Workstation: [id number]   CT HEAD WO CONTRAST Result Date: 04/30/2024 EXAM: CT HEAD WITHOUT CONTRAST 04/30/2024 05:47:34 AM TECHNIQUE: CT of the head was performed without the administration of intravenous contrast. Automated exposure control, iterative reconstruction, and/or weight based adjustment of the mA/kV was utilized to reduce the radiation dose to as low as reasonably achievable. COMPARISON: CT head without contrast 04/22/2024. CLINICAL HISTORY: Head trauma, moderate-severe. FINDINGS: BRAIN AND VENTRICLES: No acute hemorrhage. Gray-white differentiation is preserved. No hydrocephalus. No extra-axial collection. No mass effect or midline shift. Mild atrophy and white matter changes are stable. ORBITS: Bilateral lens replacements are noted. The globes and orbits are otherwise within normal limits. SINUSES: No acute abnormality. SOFT TISSUES AND SKULL: Left supraorbital scalp hematoma is present without underlying fracture or foreign body.  VASCULATURE: Atherosclerotic calcifications are present in the cavernous carotid arteries bilaterally and at the dural margin of both vertebral arteries. No hyperdense vessel is present. IMPRESSION: 1. No acute intracranial abnormality related to head trauma. 2. Left supraorbital scalp hematoma without underlying fracture or foreign body. Electronically signed by: Lonni Necessary MD 04/30/2024 05:53 AM EDT RP Workstation: [id number]     Procedures   Medications Ordered in the ED  azithromycin  (ZITHROMAX ) 500 mg in sodium chloride  0.9 % 250 mL IVPB (500 mg Intravenous New Bag/Given 04/30/24 0742)  lidocaine  (LIDODERM ) 5 % 1 patch (1 patch Transdermal Patch Applied 04/30/24 0649)  magnesium  sulfate IVPB 2 g 50 mL (2 g Intravenous New Bag/Given 04/30/24 0743)  cefTRIAXone  (ROCEPHIN ) 1 g in sodium chloride  0.9 % 100 mL IVPB (0 g Intravenous Stopped 04/30/24 0733)  acetaminophen  (TYLENOL ) tablet 650 mg (650 mg Oral Given 04/30/24 0649)  lactated ringers  bolus 500 mL (500 mLs Intravenous New Bag/Given 04/30/24 0741)                                    Medical Decision Making Amount and/or Complexity of Data Reviewed Labs: ordered. Radiology: ordered.  Risk OTC drugs. Prescription drug management. Decision regarding hospitalization.   This patient presents to the ED for concern of fall, this involves an extensive number of treatment options, and is a complaint that carries with it a high risk of complications and morbidity.  The differential diagnosis includes acute injuries   Co morbidities / Chronic conditions that complicate the patient evaluation  HTN, DM, CVA, GERD, anxiety, HLD, tremor   Additional history obtained:  Additional history obtained from EMR External records from outside source obtained and reviewed including patient's caregiver   Lab Tests:  I Ordered, and personally interpreted labs.  The pertinent results include: Normal hemoglobin, no leukocytosis, normal  kidney function.  Hyperglycemia is present without evidence of DKA.  Pseudohyponatremia is present.   Imaging Studies ordered:  I ordered imaging studies including x-ray of left knee and elbow; CT scan of head, cervical spine, chest, abdomen, pelvis I independently visualized and interpreted imaging which showed redemonstration of old rib fractures, for new left-sided rib fractures, new pleural effusion with left lower lobe consolidation concerning for pneumonia. I agree with the radiologist interpretation   Cardiac Monitoring: / EKG:  The patient was maintained  on a cardiac monitor.  I personally viewed and interpreted the cardiac monitored which showed an underlying rhythm of: Sinus rhythm   Problem List / ED Course / Critical interventions / Medication management  Patient presenting after unwitnessed fall.  She resides in ALF and has a new 24-hour caregiver support following a fall that occurred a week ago.  She has known prior injuries of left-sided rib fractures as well as bruising and abrasions.  Injuries today seem to be another abrasion to her head, as well as left knee and elbow.  She arrives with her caregiver at bedside.  Caregiver reports that she is currently at her mental baseline.  She is a poor historian.  She did receive an extra dose of narcotic pain medication tonight which may be contributing.  Workup was initiated.  Imaging studies notable for multiple new left-sided rib fractures in addition to a new left pleural effusion and left lower lobe consolidation.  Will treat empirically for pneumonia.  This may explain her altered mental status.  Patient to be admitted for further management. I ordered medication including ceftriaxone  and azithromycin  for empiric treatment of pneumonia; Tylenol  and lidocaine  patch for analgesia Reevaluation of the patient after these medicines showed that the patient improved I have reviewed the patients home medicines and have made adjustments as  needed   Social Determinants of Health:  Resides in assisted living facility.  Has 24-hour caregiver support.      Final diagnoses:  Fall, initial encounter  Closed fracture of multiple ribs of left side, initial encounter  Pneumonia of left lower lobe due to infectious organism    ED Discharge Orders     None          Melvenia Motto, MD 04/30/24 [phone]

## 2024-04-30 NOTE — Consult Note (Signed)
 NAME:  Kiara Blair, MRN:  [id number], DOB:  1930-07-24, LOS: 0 ADMISSION DATE:  04/30/2024, CONSULTATION DATE:  04/30/2024 REFERRING MD:  FORBES Blanch MD, CHIEF COMPLAINT:  Fall, altered mental status, pleural effusion   History of Present Illness:   The patient presents with recent falls and altered mental status. Noted to have progressive left effusion with loculation and associated rib fractures. PCCM consulted for help with management. She is accompanied by her son.  Falls - Multiple recent falls, including one last week with admission and another this morning - This morning's fall occurred while she was in her recliner when her caretaker left the room - Sustained a new bruise and a knot on her head from the most recent fall - CT scan of the head performed upon arrival was normal  Pleural effusion - Had a moderate left sided effusion with loculation and associated rib fractures  Altered mental status - Acute onset of altered mental status, code stroke called in ED - Initially able to speak but speech was incoherent - Exhibited confusion, including believing her parents were present - Currently unable to open her mouth or respond to questions appropriately  Hyperglycemia - History of elevated blood glucose levels, typically running high  Antithrombotic therapy - Currently taking aspirin  - Not on any anticoagulation therapy  Pertinent  Medical History    has a past medical history of Diabetes mellitus without complication (HCC), Hypercholesteremia, Hypertension, TIA (transient ischemic attack) (01/03/2021), and Tremor.   Significant Hospital Events: Including procedures, antibiotic start and stop dates in addition to other pertinent events     Interim History / Subjective:    Objective    Blood pressure (!) 161/68, pulse 77, temperature 98 F (36.7 C), temperature source Oral, resp. rate (!) 23, SpO2 97%.       No intake or output data in the 24 hours ending 04/30/24  1031 There were no vitals filed for this visit.  Examination: Gen:      Mild distress, frail, elderly HEENT:  EOMI, sclera anicteric, left periorbital bruise with swelling over left eye Neck:     No masses; no thyromegaly Lungs:    Rhonchi, diminished breath sounds over left  CV:         Regular rate and rhythm; no murmurs Abd:      + bowel sounds; soft, non-tender; no palpable masses, no distension Ext:    No edema; adequate peripheral perfusion Neuro: Awake, non verbal. Does not follow commands  CT chest 04/13/2024-trace left effusion, no fracture, pulmonary nodule CT chest 04/22/2014-new rib fractures, small left effusion with lower lobe atelectasis CT chest 05/01/2019-new rib fractures, progressive moderate left effusion with some loculation, left lower lobe collapse, consolidation, pulmonary nodules fractures  Resolved problem list   Assessment and Plan  Recurrent falls with rib fracture Loculated effusion Possible pneumonia indicated by lower lobe consolidation. Differential includes parapneumonic effusion versus hemothorax - Continue antibiotics - Reassess fluid collection tomorrow for potential drainage - Repeat checks x-ray and consider consider thoracentesis or chest tube placement once stable  Stroke Acute stroke suspected due to recent falls, confusion, and inability to speak coherently. Left-sided symptoms present. Immediate stabilization required before further interventions. - Code stroke called in the ED and she is awaiting placement by neurology - Reassess tomorrow for potential pleural interventions  Goals of care Appears frail and has had multiple falls and rib fractures Consider goals of care and palliative assessment  Best Practice (right click and Reselect all SmartList Selections daily)  Per primary team  Signature:   Brya Simerly MD [facility] See Amion for pager  If no response to pager , please call [phone] until  7pm After 7:00 pm call Elink  [phone] 04/30/2024, 10:32 AM

## 2024-04-30 NOTE — Code Documentation (Signed)
 Stroke Response Nurse Documentation Code Documentation  Kiara Blair is a 89 y.o. female admitted to [facility]  on 7/23 for fall with past medical hx of htn, DM2, CVA, hld. On No antithrombotic. Code stroke was activated by Dr. Tobie, admitting physician, while seeing the patient in the ED .   Patient in the ED where she was LKW at 1000 and physician noted her to have aphasia, L sided weakness, and L gaze while doing her assessment. On stroke team arrival, patient spit out something from her mouth and no longer was having difficulty with speech. Other symptoms seemed to have resolved as well and patient is back to her baseline level of confusion with no other deficits.    Stroke team at the bedside after patient activation. Patient to CT with team. NIHSS 2, see documentation for details and code stroke times. Patient with disoriented and Expressive aphasia  on exam. The following imaging was completed:  CT Head. Patient is not a candidate for IV Thrombolytic due to significant head trauma, stroke not suspected. Patient is not a candidate for IR due to LVO not suspected.   Care/Plan: q2 nihss and vitals x 12 hours then q4.   Process Delays Noted: n/a  Bedside handoff with RN Chiquita.    Lauraine LITTIE Searle  Stroke Response RN

## 2024-05-01 ENCOUNTER — Inpatient Hospital Stay (HOSPITAL_COMMUNITY)

## 2024-05-01 ENCOUNTER — Encounter (HOSPITAL_COMMUNITY)
Admission: EM | Disposition: A | Discharge status: Skilled Nursing Facility | Payer: Self-pay | Source: Home / Self Care | Attending: Internal Medicine

## 2024-05-01 DIAGNOSIS — W19XXXA Unspecified fall, initial encounter: Secondary | ICD-10-CM | POA: Diagnosis not present

## 2024-05-01 DIAGNOSIS — I509 Heart failure, unspecified: Secondary | ICD-10-CM | POA: Diagnosis not present

## 2024-05-01 DIAGNOSIS — S2242XA Multiple fractures of ribs, left side, initial encounter for closed fracture: Secondary | ICD-10-CM | POA: Diagnosis not present

## 2024-05-01 DIAGNOSIS — Y92009 Unspecified place in unspecified non-institutional (private) residence as the place of occurrence of the external cause: Secondary | ICD-10-CM | POA: Diagnosis not present

## 2024-05-01 DIAGNOSIS — J189 Pneumonia, unspecified organism: Secondary | ICD-10-CM

## 2024-05-01 DIAGNOSIS — J90 Pleural effusion, not elsewhere classified: Secondary | ICD-10-CM | POA: Diagnosis not present

## 2024-05-01 HISTORY — PX: THORACENTESIS: SHX235

## 2024-05-01 LAB — BODY FLUID CELL COUNT WITH DIFFERENTIAL
Eos, Fluid: 0 %
Lymphs, Fluid: 56 %
Monocyte-Macrophage-Serous Fluid: 24 % — ABNORMAL LOW (ref 50–90)
Neutrophil Count, Fluid: 16 % (ref 0–25)
Total Nucleated Cell Count, Fluid: 1261 uL — ABNORMAL HIGH (ref 0–1000)

## 2024-05-01 LAB — PROTEIN, PLEURAL OR PERITONEAL FLUID: Total protein, fluid: 4 g/dL

## 2024-05-01 LAB — ALBUMIN, PLEURAL OR PERITONEAL FLUID: Albumin, Fluid: 2.2 g/dL

## 2024-05-01 LAB — GLUCOSE, CAPILLARY
Glucose-Capillary: 303 mg/dL — ABNORMAL HIGH (ref 70–99)
Glucose-Capillary: 157 mg/dL — ABNORMAL HIGH (ref 70–99)
Glucose-Capillary: 302 mg/dL — ABNORMAL HIGH (ref 70–99)

## 2024-05-01 LAB — LACTATE DEHYDROGENASE, PLEURAL OR PERITONEAL FLUID: LD, Fluid: 254 U/L — ABNORMAL HIGH (ref 3–23)

## 2024-05-01 LAB — STREP PNEUMONIAE URINARY ANTIGEN: Strep Pneumo Urinary Antigen: NEGATIVE

## 2024-05-01 SURGERY — THORACENTESIS

## 2024-05-01 MED ORDER — MIDAZOLAM HCL 2 MG/2ML IJ SOLN
1.0000 mg | INTRAMUSCULAR | Status: AC | PRN
  Administered 2024-05-01: 1 mg via INTRAVENOUS
  Filled 2024-05-01: qty 2

## 2024-05-01 MED ORDER — INSULIN ASPART 100 UNIT/ML IJ SOLN
0.0000 [IU] | Freq: Three times a day (TID) | INTRAMUSCULAR | Status: DC
  Administered 2024-05-01: 7 [IU] via SUBCUTANEOUS
  Administered 2024-05-01: 2 [IU] via SUBCUTANEOUS
  Administered 2024-05-02: 5 [IU] via SUBCUTANEOUS
  Administered 2024-05-02 (×2): 9 [IU] via SUBCUTANEOUS
  Administered 2024-05-03 (×2): 5 [IU] via SUBCUTANEOUS
  Administered 2024-05-03: 3 [IU] via SUBCUTANEOUS
  Administered 2024-05-04: 2 [IU] via SUBCUTANEOUS
  Administered 2024-05-04 – 2024-05-05 (×3): 5 [IU] via SUBCUTANEOUS

## 2024-05-01 MED ORDER — LORAZEPAM 2 MG/ML IJ SOLN: 1.0000 mg | Freq: Once | INTRAMUSCULAR | Status: DC | PRN

## 2024-05-01 MED ORDER — QUETIAPINE FUMARATE 25 MG PO TABS: 25.0000 mg | ORAL_TABLET | Freq: Two times a day (BID) | ORAL | Status: DC

## 2024-05-01 MED ORDER — QUETIAPINE FUMARATE 25 MG PO TABS
25.0000 mg | ORAL_TABLET | Freq: Every day | ORAL | Status: DC
  Administered 2024-05-01 – 2024-05-04 (×4): 25 mg via ORAL
  Filled 2024-05-01 (×4): qty 1

## 2024-05-01 MED ORDER — MELATONIN 5 MG PO TABS
10.0000 mg | ORAL_TABLET | Freq: Every day | ORAL | Status: DC
  Administered 2024-05-01 – 2024-05-04 (×4): 10 mg via ORAL
  Filled 2024-05-01 (×4): qty 2

## 2024-05-01 MED ORDER — SODIUM CHLORIDE 0.9 % IV SOLN
3.0000 g | Freq: Four times a day (QID) | INTRAVENOUS | Status: DC
  Administered 2024-05-01 – 2024-05-06 (×21): 3 g via INTRAVENOUS
  Filled 2024-05-01 (×21): qty 8

## 2024-05-01 MED ORDER — ACETAMINOPHEN 500 MG PO TABS
1000.0000 mg | ORAL_TABLET | Freq: Three times a day (TID) | ORAL | Status: DC
  Administered 2024-05-01 – 2024-05-06 (×14): 1000 mg via ORAL
  Filled 2024-05-01 (×14): qty 2

## 2024-05-01 NOTE — Progress Notes (Addendum)
 PROGRESS NOTE        PATIENT DETAILS Name: Kiara Blair Age: 89 y.o. Sex: female Date of Birth: [birth year] Admit Date: 04/30/2024 Admitting Physician Mario Tobie GAILS, MD ERE:Bnlwh, Tinnie BRAVO, PA  Brief Summary: Patient is a 89 y.o.  female who was hospitalized at [facility] (7/15-7/19) for fall/left seventh rib fracture/trace left pneumothorax-presented to the hospital on 7/23 with multiple falls-found to have numerous new rib fractures, PNA with pleural effusions.  While in the ED-patient developed aphasia-requiring neurology evaluation.  Significant events: 7/23>> admit to [facility].  Significant studies: 7/23>> CT head: No acute intracranial abnormality. 7/23>> CT C-spine: No acute fractures. 7/23>> CT chest/abdomen/pelvis: New left 5th, 6th, 7th and 10th rib fracture.  Moderate left pleural effusion with loculation.  Left lower lobe consolidation. 7/23>> x-ray left knee: No fracture. 7/23>> x-ray left elbow: No fracture. 7/23>> repeat CT head code stroke: No ICH.  Significant microbiology data: None  Procedures: None  Consults: Pulmonology Neurology  Subjective: Confused overnight-but easily redirectable this morning.  Able to tell me her name-her son's name-follows some simple commands.  Objective: Vitals: Blood pressure (!) 151/59, pulse 96, temperature 98.2 F (36.8 C), resp. rate (!) 27, SpO2 96%.   Exam: Gen Exam: Chronically frail appearing.  Confused but not in any distress.  Speech slow but clear. Chest: Diminished air entry at both bases-left more than right. CVS:S1S2 regular Abdomen:soft non tender, non distended Extremities:no edema Neurology: Difficult exam but moving all 4 extremities. Skin: no rash  Pertinent Labs/Radiology:    Latest Ref Rng & Units 04/30/2024   10:27 AM 04/30/2024   10:24 AM 04/30/2024    6:31 AM  CBC  WBC 4.0 - 10.5 K/uL  9.8    Hemoglobin 12.0 - 15.0 g/dL 86.6  87.7  86.3   Hematocrit 36.0 - 46.0 % 39.0  37.0   40.0   Platelets 150 - 400 K/uL  344      Lab Results  Component Value Date   NA 130 (L) 04/30/2024   K 4.3 04/30/2024   CL 97 (L) 04/30/2024   CO2 23 04/30/2024     Assessment/Plan: Recurrent fall with new left left 5th, 6th, 7th and 10th rib fracture Supportive care Will schedule Tylenol  Lidoderm  patch Incentive spirometry/flutter valve Minimize narcotics as much as possible. PT/OT eval Check Orthostatic May need SNF  PNA with left-sided pleural effusion ?  Aspiration (son acknowledges accumulation of saliva-and difficulty spitting) Switch to Unasyn  SLP eval. PCCM following-will defer thoracocentesis to PCCM team.  Transient aphasia Occurred in the ED Suspicion that this may be related to narcotic administration for pain-as patient is not on hydralazine  Aphasia seems to have resolved Low suspicion for CVA but awaiting MRI brain.  Delirium Suspect secondary to PNA/narcotics/advanced.-Not sure if she has some amount of cognitive dysfunction at baseline. Reviewed prior notes-had delirium during her most recent hospitalization as well Start Seroquel   Essential tremor Propranolol   GAD Lexapro   Hyponatremia Mild Follow electrolytes periodically.  HTN BP stable Amlodipine /benazepril . Follow/optimize.  Prior history of TIA Aspirin /statin See above regarding plans for MRI  DM-2 (A1c 9.8 on 7/23) Start SSI Not a candidate for aggressive glycemic control-allow some amount of permissive hyperglycemia  Recent Labs    04/30/24 1022  GLUCAP 279*    6 mm left upper lobe pulmonary nodule Incidental finding on CT chest Outpatient follow-up  Advance directive/palliative  care Second hospitalization in a week Frail-at risk for further decompensation-see documentation above Spoke with son [name]-other son Ubaldo was on the line as well-all agreeable to DNR status.  Code status:   Code Status: Full Code   DVT Prophylaxis: heparin  injection 5,000 Units Start:  04/30/24 1400   Family Communication: Son-[name]    Disposition Plan: Status is: Inpatient Remains inpatient appropriate because: Severity of illness   Planned Discharge Destination:Skilled nursing facility   Diet: Diet Order             Diet NPO time specified Except for: Sips with Meds, Ice Chips  Diet effective midnight                     Antimicrobial agents: Anti-infectives (From admission, onward)    Start     Dose/Rate Route Frequency Ordered Stop   05/01/24 0700  cefTRIAXone  (ROCEPHIN ) 2 g in sodium chloride  0.9 % 100 mL IVPB        2 g 200 mL/hr over 30 Minutes Intravenous Every 24 hours 04/30/24 0959 05/05/24 0659   05/01/24 0700  azithromycin  (ZITHROMAX ) 500 mg in sodium chloride  0.9 % 250 mL IVPB        500 mg 250 mL/hr over 60 Minutes Intravenous Every 24 hours 04/30/24 0959 05/05/24 0659   04/30/24 0645  cefTRIAXone  (ROCEPHIN ) 1 g in sodium chloride  0.9 % 100 mL IVPB        1 g 200 mL/hr over 30 Minutes Intravenous  Once 04/30/24 0641 04/30/24 0733   04/30/24 0645  azithromycin  (ZITHROMAX ) 500 mg in sodium chloride  0.9 % 250 mL IVPB        500 mg 250 mL/hr over 60 Minutes Intravenous  Once 04/30/24 0641 04/30/24 0914        MEDICATIONS: Scheduled Meds:  amLODipine   5 mg Oral Daily   And   benazepril   20 mg Oral Daily   aspirin  EC  81 mg Oral Daily   atorvastatin   40 mg Oral QHS   escitalopram   5 mg Oral QHS   heparin   5,000 Units Subcutaneous Q8H   lidocaine   1 patch Transdermal Q24H   propranolol   10 mg Oral BID   sodium chloride  flush  3 mL Intravenous Once   Continuous Infusions:  azithromycin  500 mg (05/01/24 0558)   cefTRIAXone  (ROCEPHIN )  IV 2 g (05/01/24 0556)   PRN Meds:.acetaminophen  **OR** acetaminophen , albuterol , docusate sodium , guaiFENesin , hydrALAZINE , LORazepam , morphine  injection   I have personally reviewed following labs and imaging studies  LABORATORY DATA: CBC: Recent Labs  Lab 04/30/24 0625  04/30/24 0631 04/30/24 1024 04/30/24 1027  WBC 9.9  --  9.8  --   NEUTROABS  --   --  6.6  --   HGB 12.0 13.6 12.2 13.3  HCT 35.7* 40.0 37.0 39.0  MCV 77.4*  --  77.6*  --   PLT 406*  --  344  --     Basic Metabolic Panel: Recent Labs  Lab 04/30/24 0625 04/30/24 0631 04/30/24 1024 04/30/24 1027  NA 129* 129* 129* 130*  K 4.3 4.4 4.3 4.3  CL 93* 95* 95* 97*  CO2 24  --  23  --   GLUCOSE 322* 328* 295* 299*  BUN 18 21 16 18   CREATININE 0.81 0.70 0.68 0.70  CALCIUM  9.5  --  9.2  --   MG 1.6*  --   --   --     GFR: Estimated Creatinine Clearance: 37.1 mL/min (  by C-G formula based on SCr of 0.7 mg/dL).  Liver Function Tests: Recent Labs  Lab 04/30/24 0625 04/30/24 1024  AST 26 25  ALT 23 20  ALKPHOS 148* 146*  BILITOT 0.9 0.9  PROT 6.0* 5.9*  ALBUMIN 2.8* 2.8*   No results for input(s): LIPASE, AMYLASE in the last 168 hours. No results for input(s): AMMONIA in the last 168 hours.  Coagulation Profile: Recent Labs  Lab 04/30/24 0625 04/30/24 1024  INR 1.0 1.0    Cardiac Enzymes: No results for input(s): CKTOTAL, CKMB, CKMBINDEX, TROPONINI in the last 168 hours.  BNP (last 3 results) No results for input(s): PROBNP in the last 8760 hours.  Lipid Profile: Recent Labs    04/30/24 2158  CHOL 135  HDL 65  LDLCALC 48  TRIG 110  CHOLHDL 2.1    Thyroid Function Tests: Recent Labs    04/30/24 1024  TSH 1.325  FREET4 1.30*    Anemia Panel: Recent Labs    04/30/24 1024 04/30/24 2158  VITAMINB12  --  821  FOLATE 17.1  --     Urine analysis:    Component Value Date/Time   COLORURINE STRAW (A) 04/30/2024 1800   APPEARANCEUR CLEAR 04/30/2024 1800   LABSPEC 1.006 04/30/2024 1800   PHURINE 7.0 04/30/2024 1800   GLUCOSEU >=500 (A) 04/30/2024 1800   HGBUR NEGATIVE 04/30/2024 1800   BILIRUBINUR NEGATIVE 04/30/2024 1800   KETONESUR 5 (A) 04/30/2024 1800   PROTEINUR NEGATIVE 04/30/2024 1800   NITRITE NEGATIVE 04/30/2024 1800    LEUKOCYTESUR NEGATIVE 04/30/2024 1800    Sepsis Labs: Lactic Acid, Venous    Component Value Date/Time   LATICACIDVEN 0.77 06/09/2017 1350    MICROBIOLOGY: No results found for this or any previous visit (from the past 240 hours).  RADIOLOGY STUDIES/RESULTS: DG CHEST PORT 1 VIEW Result Date: 05/01/2024 CLINICAL DATA:  Loculated pleural effusion EXAM: PORTABLE CHEST 1 VIEW COMPARISON:  04/23/2024 FINDINGS: Extensive airspace opacity on the left with obscuration of the left heart border and left hemidiaphragm. Some of this may well be from pleural effusion, but substantial atelectasis or basilar pneumonia is not excluded. The right lung is grossly clear. Left rib fractures are identified. Atherosclerotic calcification of the aortic arch. The patient is rotated to the left on today's radiograph, reducing diagnostic sensitivity and specificity. IMPRESSION: 1. Extensive airspace opacity on the left with obscuration of the left heart border and left hemidiaphragm. Some of this may well be from pleural effusion, but substantial atelectasis or basilar pneumonia is not excluded. 2. Left rib fractures. 3. Aortic Atherosclerosis (ICD10-I70.0). Electronically Signed   By: Ryan Salvage M.D.   On: 05/01/2024 08:45   CT HEAD CODE STROKE WO CONTRAST Result Date: 04/30/2024 CLINICAL DATA:  Code stroke. Neuro deficit, acute, stroke suspected. Speech difficulty. Fall. EXAM: CT HEAD WITHOUT CONTRAST TECHNIQUE: Contiguous axial images were obtained from the base of the skull through the vertex without intravenous contrast. RADIATION DOSE REDUCTION: This exam was performed according to the departmental dose-optimization program which includes automated exposure control, adjustment of the mA and/or kV according to patient size and/or use of iterative reconstruction technique. COMPARISON:  Head CT earlier today and MRI 01/04/2021 FINDINGS: Brain: There is no evidence of an acute infarct, intracranial hemorrhage,  mass, midline shift, or extra-axial fluid collection. There is mild cerebral atrophy. Cerebral white matter hypodensities are unchanged from today's earlier CT and are nonspecific but compatible with mild chronic small vessel ischemic disease. Vascular: Calcified atherosclerosis at the skull base. No  hyperdense vessel. Skull: No acute fracture or suspicious lesion. Sinuses/Orbits: Mild mucosal thickening in the left maxillary sinus. Clear mastoid air cells. Bilateral cataract extraction. Other: Left supraorbital hematoma as noted earlier. ASPECTS ([place] Stroke Program Early CT Score) - Ganglionic level infarction (caudate, lentiform nuclei, internal capsule, insula, M1-M3 cortex): 7 - Supraganglionic infarction (M4-M6 cortex): 3 Total score (0-10 with 10 being normal): 10 These results were communicated to Dr. Merrianne at 10:40 am on 04/30/2024 by text page via the [facility] messaging system. IMPRESSION: 1. No evidence of acute intracranial abnormality. ASPECTS of 10. 2. Mild chronic small vessel ischemic disease. Electronically Signed   By: Dasie Hamburg M.D.   On: 04/30/2024 10:40   CT CHEST ABDOMEN PELVIS WO CONTRAST Result Date: 04/30/2024 CLINICAL DATA:  Status post fall from standing with head injury. EXAM: CT CHEST, ABDOMEN AND PELVIS WITHOUT CONTRAST TECHNIQUE: Multidetector CT imaging of the chest, abdomen and pelvis was performed following the standard protocol without IV contrast. RADIATION DOSE REDUCTION: This exam was performed according to the departmental dose-optimization program which includes automated exposure control, adjustment of the mA and/or kV according to patient size and/or use of iterative reconstruction technique. COMPARISON:  04/22/2024 FINDINGS: CT CHEST FINDINGS Cardiovascular: Heart size upper normal. No substantial pericardial effusion. Moderate atherosclerotic calcification is noted in the wall of the thoracic aorta. Mediastinum/Nodes: No mediastinal lymphadenopathy. 2.8 cm right  thyroid nodule evident. This has been evaluated on previous imaging. (ref: J Am Coll Radiol. 2015 Feb;12(2): 143-50).No evidence for gross hilar lymphadenopathy although assessment is limited by the lack of intravenous contrast on the current study. Moderate hiatal hernia. The esophagus has normal imaging features. The There is no axillary lymphadenopathy. Lungs/Pleura: 6 mm left upper lobe nodule identified on 54/5. Fine architectural detail of lung parenchyma obscured by breathing motion. Ground-glass opacity identified posterior left upper lobe with left lower lobe collapse/consolidation. Moderate left pleural effusion is progressive in the interval, potentially with some loculation towards the apex. Musculoskeletal: Multiple left-sided rib fractures noted. Patient had apparently acute fractures of the lateral left sixth and seventh ribs previously. There is now an acute fracture in the left fifth rib suggesting interval re-injury. There is a new fracture in the posterior left sixth rib and probably a new fracture in the posterior left seventh rib. Fracture of the posterior left eighth ninth, and tenth ribs appear nonacute. New acute fracture identified in the posterior left tenth rib. No evidence for sternal or thoracic spine fracture CT ABDOMEN PELVIS FINDINGS Hepatobiliary: No suspicious focal abnormality in the liver on this study without intravenous contrast. Gallbladder is surgically absent. No intrahepatic or extrahepatic biliary dilation. Pancreas: No focal mass lesion. No dilatation of the main duct. No intraparenchymal cyst. No peripancreatic edema. Spleen: No splenomegaly. No suspicious focal mass lesion. Adrenals/Urinary Tract: No adrenal nodule or mass. Kidneys unremarkable. No evidence for hydroureter. The urinary bladder appears normal for the degree of distention. Stomach/Bowel: Moderate to large hiatal hernia. Duodenum is normally positioned as is the ligament of Treitz. No small bowel wall  thickening. No small bowel dilatation. The terminal ileum is normal. No gross colonic mass. No colonic wall thickening. Diverticular changes are noted in the left colon without evidence of diverticulitis. Vascular/Lymphatic: There is moderate atherosclerotic calcification of the abdominal aorta without aneurysm. There is no gastrohepatic or hepatoduodenal ligament lymphadenopathy. No retroperitoneal or mesenteric lymphadenopathy. No pelvic sidewall lymphadenopathy. Reproductive: The uterus is surgically absent. There is no adnexal mass. Other: No intraperitoneal free fluid. Musculoskeletal: No worrisome lytic or  sclerotic osseous abnormality. No evidence for an acute fracture in the bony pelvis or lumbar spine. IMPRESSION: 1. Multiple left-sided rib fractures of varying chronicity. Fractures in the left fifth, sixth, seventh, and tenth ribs appear new since the 04/22/2024 exam. 2. Moderate left pleural effusion is progressive in the interval, potentially with some loculation towards the apex. No pneumothorax. 3. Left lower lobe collapse/consolidation with ground-glass opacity in the posterior left upper lobe. Imaging features could be related to atelectasis or pneumonia. 4. 6 mm left upper lobe pulmonary nodule. Multiple additional left upper lobe pulmonary nodule seen previously are obscured by collapse/consolidative disease on the current study. Follow-up warranted. 5. Moderate to large hiatal hernia. 6. Left colonic diverticulosis without diverticulitis. 7.  Aortic Atherosclerosis (ICD10-I70.0). Electronically Signed   By: Camellia Candle M.D.   On: 04/30/2024 06:13   DG Elbow Complete Left Result Date: 04/30/2024 EXAM: 3 VIEW(S) XRAY OF THE LEFT ELBOW COMPARISON: None available. CLINICAL HISTORY: Bruise. L fall after being given pain medication (oxycodone ). Minor abrasion on the left knee new, abrasion on the left elbow. FINDINGS: BONES AND JOINTS: No acute fracture. No focal osseous lesion. No joint  dislocation. No effusion is present. SOFT TISSUES: Soft tissue swelling is present over the dorsum of the elbow. IMPRESSION: 1. No acute fracture or effusion. 2. Soft tissue swelling over the dorsum of the elbow. Electronically signed by: Lonni Necessary MD 04/30/2024 06:08 AM EDT RP Workstation: [id number]   DG Knee Left Port Result Date: 04/30/2024 EXAM: 2 VIEW(S) XRAY OF THE LEFT KNEE 04/30/2024 05:57:48 AM COMPARISON: None available. CLINICAL HISTORY: 13608 Bruise 13608. l fall after being given pain medication (oxycodone ).; Minor abrasion on the left knee new, abrasion on the left elbow FINDINGS: BONES AND JOINTS: No acute fracture. No focal osseous lesion. No joint dislocation. No significant joint effusion. Mild degenerative changes are present in the lateral compartment. SOFT TISSUES: The soft tissues are unremarkable. Atherosclerotic calcifications are present. IMPRESSION: 1. No acute fracture or dislocation. 2. Mild degenerative changes in the lateral compartment. Electronically signed by: Lonni Necessary MD 04/30/2024 06:08 AM EDT RP Workstation: [id number]   CT CERVICAL SPINE WO CONTRAST Result Date: 04/30/2024 EXAM: CT CERVICAL SPINE WITHOUT CONTRAST 04/30/2024 05:47:34 AM TECHNIQUE: CT of the cervical spine was performed without the administration of intravenous contrast. Multiplanar reformatted images are provided for review. Automated exposure control, iterative reconstruction, and/or weight based adjustment of the mA/kV was utilized to reduce the radiation dose to as low as reasonably achievable. COMPARISON: CT of the cervical spine 04/22/2024. CLINICAL HISTORY: Polytrauma, blunt. FINDINGS: CERVICAL SPINE: BONES AND ALIGNMENT: No acute fracture or traumatic malalignment. Cervical lordosis is preserved. DEGENERATIVE CHANGES: Soft disc protrusions again noted at C3, C3-4, C6-7 and C7-T1. Uncovertebral and osseous foraminal narrowing is again noted bilaterally at C5-6. SOFT TISSUES: No  prevertebral soft tissue swelling. IMPRESSION: 1. No acute fractures. 2. Soft disc protrusions at C3, C3-4, C6-7, and C7-T1. 3. Uncovertebral and osseous foraminal narrowing bilaterally at C5-6. Electronically signed by: Lonni Necessary MD 04/30/2024 05:55 AM EDT RP Workstation: [id number]   CT HEAD WO CONTRAST Result Date: 04/30/2024 EXAM: CT HEAD WITHOUT CONTRAST 04/30/2024 05:47:34 AM TECHNIQUE: CT of the head was performed without the administration of intravenous contrast. Automated exposure control, iterative reconstruction, and/or weight based adjustment of the mA/kV was utilized to reduce the radiation dose to as low as reasonably achievable. COMPARISON: CT head without contrast 04/22/2024. CLINICAL HISTORY: Head trauma, moderate-severe. FINDINGS: BRAIN AND VENTRICLES: No acute hemorrhage. Gray-white differentiation is preserved. No  hydrocephalus. No extra-axial collection. No mass effect or midline shift. Mild atrophy and white matter changes are stable. ORBITS: Bilateral lens replacements are noted. The globes and orbits are otherwise within normal limits. SINUSES: No acute abnormality. SOFT TISSUES AND SKULL: Left supraorbital scalp hematoma is present without underlying fracture or foreign body. VASCULATURE: Atherosclerotic calcifications are present in the cavernous carotid arteries bilaterally and at the dural margin of both vertebral arteries. No hyperdense vessel is present. IMPRESSION: 1. No acute intracranial abnormality related to head trauma. 2. Left supraorbital scalp hematoma without underlying fracture or foreign body. Electronically signed by: Lonni Necessary MD 04/30/2024 05:53 AM EDT RP Workstation: [id number]     LOS: 1 day   Donalda Applebaum, MD  [facility]    To contact the attending provider between 7A-7P or the covering provider during after hours 7P-7A, please log into the web site [email or web address] and access using universal [place] password for that web  site. If you do not have the password, please call the hospital operator.  05/01/2024, 9:48 AM

## 2024-05-01 NOTE — Progress Notes (Signed)
 Pharmacy Antibiotic Note  Kiara Blair is a 89 y.o. female admitted on 04/30/2024 with pneumonia.  Pharmacy has been consulted for unasyn  dosing.  Plan: Unasyn  3 gram iv q6h     Temp (24hrs), Avg:98 F (36.7 C), Min:97.8 F (36.6 C), Max:98.2 F (36.8 C)  Recent Labs  Lab 04/30/24 0625 04/30/24 0631 04/30/24 1024 04/30/24 1027  WBC 9.9  --  9.8  --   CREATININE 0.81 0.70 0.68 0.70    Estimated Creatinine Clearance: 37.1 mL/min (by C-G formula based on SCr of 0.7 mg/dL).    Allergies  Allergen Reactions   Doxycycline Hyclate Nausea And Vomiting   Levofloxacin Nausea And Vomiting   Meloxicam Nausea And Vomiting   Naproxen-Esomeprazole Mg Nausea And Vomiting   Nitrofurantoin Nausea And Vomiting   Esomeprazole Other (See Comments)    No reaction listed on MAR   Naproxen Other (See Comments)    No reaction listed on MAR   Other     Pt states she is allergic to meds but does not know names   Pneumococcal Polysaccharide Vaccine Itching      Thank you for allowing pharmacy to be a part of this patient's care.  Benedetta Heath BS, PharmD, BCPS Clinical Pharmacist 05/01/2024 10:17 AM  Contact: [phone] after 3 PM

## 2024-05-01 NOTE — Procedures (Signed)
 Thoracentesis  Procedure Note  Kiara Blair  [id number]  Jan 11, 1930  Date:05/01/24  Time:2:16 PM   Provider Performing:Chrissa Meetze   Procedure: Thoracentesis with imaging guidance ([id number])  Indication(s) Pleural Effusion  Consent Risks of the procedure as well as the alternatives and risks of each were explained to the patient and/or caregiver.  Consent for the procedure was obtained and is signed in the bedside chart  Anesthesia Topical only with 1% lidocaine     Time Out Verified patient identification, verified procedure, site/side was marked, verified correct patient position, special equipment/implants available, medications/allergies/relevant history reviewed, required imaging and test results available.   Sterile Technique Maximal sterile technique including full sterile barrier drape, hand hygiene, sterile gown, sterile gloves, mask, hair covering, sterile ultrasound probe cover (if used).  Procedure Description Ultrasound was used to identify appropriate pleural anatomy for placement and overlying skin marked.  Area of drainage cleaned and draped in sterile fashion. Lidocaine  was used to anesthetize the skin and subcutaneous tissue.  70 cc's of blood was drained from the left pleural space. Catheter then removed and bandaid applied to site.   Complications/Tolerance None; patient tolerated the procedure well. Chest X-ray is ordered to confirm no post-procedural complication.   EBL Minimal   Specimen(s) Pleural fluid  Kiara Coder MD [facility] See Amion for pager  If no response to pager , please call [phone] until 7pm After 7:00 pm call Elink  [phone] 05/01/2024, 2:16 PM

## 2024-05-01 NOTE — Progress Notes (Signed)
 2100 Pt agitated easily when attempting to keep her in bed, sitter and RN attempting to assess patient and attend to her needs, disoriented x4 , redirected wit some difficulty.   NIHSS q 2 hours unable to assess ataxia and pt does not answer questions well on NIHSS assessment.  Sitters changed and pt given medications, pt took meds crushed with applesauce.   2230 Pt frequently attempting frequently to pull at gown and PIVs and mittens placed for support to keep lines in place. 0000 Pt sleeping at intervals.  Towards morning, patient more alert and calmer, still wanting to get out of bed but staying in bed, not attempting to get up.  Bed alarm on and sitter at bedside.  0720 This morning found that sitter had left, mittens removed form patients hands, replaced to keep lines in,report given to oncoming RN.

## 2024-05-01 NOTE — Plan of Care (Signed)

## 2024-05-01 NOTE — Progress Notes (Signed)
 SLP Cancellation Note  Patient Details Name: Kiara Blair MRN: [id number] DOB: 1930/05/23   Cancelled treatment:       Reason Eval/Treat Not Completed: Medical issues which prohibited therapy. Pt is currently NPO pending chest tube placement. SLP will f/u as able.    Damien Blumenthal, M.A., CCC-SLP Speech Language Pathology, Acute Rehabilitation Services  Secure Chat preferred [phone]  05/01/2024, 10:46 AM

## 2024-05-01 NOTE — Progress Notes (Signed)
 PT Cancellation Note  Patient Details Name: Kiara Blair MRN: [id number] DOB: 18-Aug-1930   Cancelled Treatment:    Reason Eval/Treat Not Completed: Other (comment);Medical issues which prohibited therapy (Per RN, pt has been very agitated this morning. Pt also noted to be going for chest tube placement later today. Will hold therapy for today and follow tomorrow.)   Esmeralda Malay 05/01/2024, 9:26 AM

## 2024-05-01 NOTE — Progress Notes (Signed)
 NAME:  Kiara Blair, MRN:  [id number], DOB:  [birth year], LOS: 1 ADMISSION DATE:  04/30/2024, CONSULTATION DATE:  04/30/2024 REFERRING MD:  FORBES Blanch MD, CHIEF COMPLAINT:  Fall, altered mental status, pleural effusion   History of Present Illness:   The patient presents with recent falls and altered mental status. Noted to have progressive left effusion with loculation and associated rib fractures. PCCM consulted for help with management. She is accompanied by her son.  Falls - Multiple recent falls, including one last week with admission and another this morning - This morning's fall occurred while she was in her recliner when her caretaker left the room - Sustained a new bruise and a knot on her head from the most recent fall - CT scan of the head performed upon arrival was normal  Pleural effusion - Had a moderate left sided effusion with loculation and associated rib fractures  Altered mental status, baseline dementia - Acute onset of altered mental status, code stroke called in ED - Initially able to speak but speech was incoherent - Exhibited confusion, including believing her parents were present - Currently unable to open her mouth or respond to questions appropriately  Hyperglycemia - History of elevated blood glucose levels, typically running high  Antithrombotic therapy - Currently taking aspirin  - Not on any anticoagulation therapy  Pertinent  Medical History    has a past medical history of Diabetes mellitus without complication (HCC), Hypercholesteremia, Hypertension, TIA (transient ischemic attack) (01/03/2021), and Tremor.   Significant Hospital Events: Including procedures, antibiotic start and stop dates in addition to other pertinent events   7/23 Admit  Interim History / Subjective:   Confused, delirious overnight.  Pulling out her IVs   Objective    Blood pressure (!) 170/125, pulse 100, temperature 97.7 F (36.5 C), temperature source Axillary, resp.  rate (!) 27, SpO2 96%.        Intake/Output Summary (Last 24 hours) at 05/01/2024 1223 Last data filed at 05/01/2024 0700 Gross per 24 hour  Intake --  Output 100 ml  Net -100 ml   There were no vitals filed for this visit.  Examination: Gen:      Mild distress, frail, elderly HEENT:  EOMI, sclera anicteric, left periorbital bruise with swelling over left eye Neck:     No masses; no thyromegaly Lungs:    Rhonchi, diminished breath sounds over left  CV:         Regular rate and rhythm; no murmurs Abd:      + bowel sounds; soft, non-tender; no palpable masses, no distension Ext:    No edema; adequate peripheral perfusion Neuro: Awake, non verbal. Does not follow commands  CT chest 04/13/2024-trace left effusion, no fracture, pulmonary nodule CT chest 04/22/2014-new rib fractures, small left effusion with lower lobe atelectasis CT chest 05/01/2019-new rib fractures, progressive moderate left effusion with some loculation, left lower lobe collapse, consolidation, pulmonary nodules fractures  Resolved problem list   Assessment and Plan  Recurrent falls with rib fracture Loculated effusion Possible pneumonia indicated by lower lobe consolidation. Differential includes parapneumonic effusion versus hemothorax - Continue antibiotics - Plan on thoracentesis today.  Would like to avoid a chest tube due to her delirium, pulling out her lines  Goals of care Appears frail and has had multiple falls and rib fractures Consider goals of care and palliative assessment  Best Practice (right click and Reselect all SmartList Selections daily)   Per primary team  Signature:   Zyiah Withington MD [facility] See Amion for pager  If no response to pager , please call [phone] until 7pm After 7:00 pm call Elink  [phone] 05/01/2024, 12:23 PM

## 2024-05-01 NOTE — Inpatient Diabetes Management (Signed)
 Inpatient Diabetes Program Recommendations  AACE/ADA: New Consensus Statement on Inpatient Glycemic Control (2015)  Target Ranges:  Prepandial:   less than 140 mg/dL      Peak postprandial:   less than 180 mg/dL (1-2 hours)      Critically ill patients:  140 - 180 mg/dL   Lab Results  Component Value Date   GLUCAP 279 (H) 04/30/2024   HGBA1C 9.8 (H) 04/30/2024    Review of Glycemic Control  Latest Reference Range & Units 04/25/24 21:43 04/26/24 08:10 04/26/24 11:46 04/30/24 10:22  Glucose-Capillary 70 - 99 mg/dL 690 (H) 783 (H) 780 (H) 279 (H)  (H): Data is abnormally high Diabetes history: DM2 Outpatient Diabetes medications: Kazano (alogliptin-Metformin ) 12.02-999 mg BID, Novolog  5 units TID, Lantus  10 units QD Current orders for Inpatient glycemic control: none   Inpatient Diabetes Program Recommendations:     Semglee  4 units every day Novolog  0-6 units TID Secure chat sent to MD.  Thanks, Tinnie Minus, MSN, RNC-OB Diabetes Coordinator [phone] (8a-5p)

## 2024-05-01 NOTE — TOC CAGE-AID Note (Signed)
 Transition of Care [facility]) - CAGE-AID Screening   Patient Details  Name: Kiara Blair MRN: [id number] Date of Birth: 06/28/1930  Transition of Care [facility]) CM/SW Contact:    Lucendia Leard E Jezabel Lecker, LCSW Phone Number: 05/01/2024, 9:43 AM   Clinical Narrative: Disoriented x 4.  No SA noted.   CAGE-AID Screening: Substance Abuse Screening unable to be completed due to: : Patient unable to participate

## 2024-05-02 ENCOUNTER — Inpatient Hospital Stay (HOSPITAL_COMMUNITY)

## 2024-05-02 DIAGNOSIS — J189 Pneumonia, unspecified organism: Secondary | ICD-10-CM | POA: Diagnosis not present

## 2024-05-02 DIAGNOSIS — Y92009 Unspecified place in unspecified non-institutional (private) residence as the place of occurrence of the external cause: Secondary | ICD-10-CM | POA: Diagnosis not present

## 2024-05-02 DIAGNOSIS — J90 Pleural effusion, not elsewhere classified: Secondary | ICD-10-CM | POA: Diagnosis not present

## 2024-05-02 DIAGNOSIS — Z515 Encounter for palliative care: Secondary | ICD-10-CM

## 2024-05-02 DIAGNOSIS — Z7189 Other specified counseling: Secondary | ICD-10-CM

## 2024-05-02 DIAGNOSIS — I509 Heart failure, unspecified: Secondary | ICD-10-CM | POA: Diagnosis not present

## 2024-05-02 DIAGNOSIS — S2242XA Multiple fractures of ribs, left side, initial encounter for closed fracture: Secondary | ICD-10-CM | POA: Diagnosis not present

## 2024-05-02 DIAGNOSIS — W19XXXA Unspecified fall, initial encounter: Secondary | ICD-10-CM | POA: Diagnosis not present

## 2024-05-02 LAB — CBC
HCT: 37.5 % (ref 36.0–46.0)
Hemoglobin: 12.4 g/dL (ref 12.0–15.0)
MCH: 25.6 pg — ABNORMAL LOW (ref 26.0–34.0)
MCHC: 33.1 g/dL (ref 30.0–36.0)
MCV: 77.3 fL — ABNORMAL LOW (ref 80.0–100.0)
Platelets: 417 K/uL — ABNORMAL HIGH (ref 150–400)
RBC: 4.85 MIL/uL (ref 3.87–5.11)
RDW: 16.3 % — ABNORMAL HIGH (ref 11.5–15.5)
WBC: 13.1 K/uL — ABNORMAL HIGH (ref 4.0–10.5)
nRBC: 0 % (ref 0.0–0.2)

## 2024-05-02 LAB — BASIC METABOLIC PANEL WITH GFR
Anion gap: 13 (ref 5–15)
BUN: 19 mg/dL (ref 8–23)
CO2: 22 mmol/L (ref 22–32)
Calcium: 9.6 mg/dL (ref 8.9–10.3)
Chloride: 100 mmol/L (ref 98–111)
Creatinine, Ser: 0.81 mg/dL (ref 0.44–1.00)
GFR, Estimated: >60 mL/min (ref >60)
Glucose, Bld: 353 mg/dL — ABNORMAL HIGH (ref 70–99)
Potassium: 3.8 mmol/L (ref 3.5–5.1)
Sodium: 135 mmol/L (ref 135–145)

## 2024-05-02 LAB — GLUCOSE, CAPILLARY
Glucose-Capillary: 352 mg/dL — ABNORMAL HIGH (ref 70–99)
Glucose-Capillary: 292 mg/dL — ABNORMAL HIGH (ref 70–99)
Glucose-Capillary: 394 mg/dL — ABNORMAL HIGH (ref 70–99)
Glucose-Capillary: 379 mg/dL — ABNORMAL HIGH (ref 70–99)

## 2024-05-02 MED ORDER — INSULIN ASPART 100 UNIT/ML IJ SOLN
0.0000 [IU] | Freq: Every day | INTRAMUSCULAR | Status: DC
  Administered 2024-05-02: 5 [IU] via SUBCUTANEOUS

## 2024-05-02 MED ORDER — INSULIN GLARGINE-YFGN 100 UNIT/ML ~~LOC~~ SOLN
8.0000 [IU] | Freq: Every day | SUBCUTANEOUS | Status: DC
  Administered 2024-05-02 – 2024-05-03 (×2): 8 [IU] via SUBCUTANEOUS
  Filled 2024-05-02 (×2): qty 0.08

## 2024-05-02 MED ORDER — INSULIN ASPART 100 UNIT/ML IJ SOLN: 0.0000 [IU] | Freq: Three times a day (TID) | INTRAMUSCULAR | Status: DC

## 2024-05-02 NOTE — Care Management Important Message (Signed)
 Important Message  Patient Details  Name: Kiara Blair MRN: [id number] Date of Birth: 11/07/1929   Important Message Given:  Yes - Medicare IM     Claretta Deed 05/02/2024, 4:25 PM

## 2024-05-02 NOTE — Evaluation (Signed)
 Occupational Therapy Evaluation Patient Details Name: Kiara Blair MRN: [id number] DOB: 10-28-1929 Today's Date: 05/02/2024   History of Present Illness   Pt is a 89 y/o F admitted on 04/30/24 after presenting with c/o falls & AMS. Pt found to have progressive L effusion with loculation & rib fxs. Pt is s/p thoracentesis 05/01/24. PMH: dementia, DM, hypercholesterolemia, HTN, TIA, tremor     Clinical Impressions Prior to last admission, pt was assisted for showering and all IADLs, ambulating with RW inconsistently. She has been requiring more assistance since returning to her ALF. Her family is providing a 1:1 caregiver. Pt presents with lethargy, but easily awakened and also easily startled. Presents with impulsivity, decreased awareness of deficits and dependence in ADLs. Caregiver at bedside reports pt as being more sleepy today. She was assisted to self feed to increase intake. Pt ambulates with hand held assist and cues for safety. Will follow acutely. Patient will benefit from continued inpatient follow up therapy, <3 hours/day, HHOT if family chooses for pt to return to the familiar environment of her ALF with ongoing 24 hour caregiver.      If plan is discharge home, recommend the following:   A lot of help with walking and/or transfers;Supervision due to cognitive status;Direct supervision/assist for financial management;Assist for transportation;Help with stairs or ramp for entrance;Direct supervision/assist for medications management;A lot of help with bathing/dressing/bathroom;Assistance with cooking/housework;Assistance with feeding     Functional Status Assessment   Patient has had a recent decline in their functional status and/or demonstrates limited ability to make significant improvements in function in a reasonable and predictable amount of time     Equipment Recommendations   None recommended by OT     Recommendations for Other Services          Precautions/Restrictions   Precautions Precautions: Fall Recall of Precautions/Restrictions: Impaired Restrictions Weight Bearing Restrictions Per Provider Order: No     Mobility Bed Mobility Overal bed mobility: Needs Assistance Bed Mobility: Supine to Sit     Supine to sit: Mod assist, HOB elevated, Used rails Sit to supine: Supervision, HOB elevated, Used rails   General bed mobility comments: assist to raise trunk    Transfers Overall transfer level: Needs assistance Equipment used: 1 person hand held assist Transfers: Sit to/from Stand Sit to Stand: Mod assist           General transfer comment: min from higher surface, mod from lower      Balance Overall balance assessment: Needs assistance, History of Falls   Sitting balance-Leahy Scale: Fair     Standing balance support: Single extremity supported Standing balance-Leahy Scale: Poor                             ADL either performed or assessed with clinical judgement   ADL                                         General ADL Comments: currently dependent in all ADLs     Vision Baseline Vision/History: 1 Wears glasses Ability to See in Adequate Light: 1 Impaired Additional Comments: central vision     Perception Perception: Impaired Preception Impairment Details: Spatial orientation     Praxis         Pertinent Vitals/Pain Pain Assessment Pain Assessment: Faces Faces Pain Scale: Hurts a little bit Pain  Location: chest Pain Descriptors / Indicators: Discomfort, Grimacing Pain Intervention(s): Monitored during session, Repositioned     Extremity/Trunk Assessment Upper Extremity Assessment Upper Extremity Assessment: Generalized weakness;Right hand dominant   Lower Extremity Assessment Lower Extremity Assessment: Generalized weakness;Difficult to assess due to impaired cognition   Cervical / Trunk Assessment Cervical / Trunk Assessment: Kyphotic;Other  exceptions (rib fractures)   Communication Communication Communication: Impaired Factors Affecting Communication: Reduced clarity of speech   Cognition Arousal: Lethargic Behavior During Therapy: Impulsive Cognition: Cognition impaired     Awareness: Intellectual awareness impaired, Online awareness impaired Memory impairment (select all impairments): Short-term memory, Working [employer] fast streamer, Non-declarative long-term memory, Geneticist, molecular long-term memory Attention impairment (select first level of impairment): Focused attention Executive functioning impairment (select all impairments): Initiation, Organization, Sequencing, Reasoning, Problem solving OT - Cognition Comments: poor safety awareness                 Following commands: Impaired Following commands impaired: Follows one step commands with increased time, Follows one step commands inconsistently     Cueing  General Comments   Cueing Techniques: Verbal cues;Visual cues  Pt with continent void & BM, requires dependent assist for peri hygiene.   Exercises     Shoulder Instructions      Home Living Family/patient expects to be discharged to:: Assisted living                             Home Equipment: Shower seat - built in;Grab bars - tub/shower;Rolling Environmental consultant (2 wheels);Cane - single point;Wheelchair - manual;Grab bars - toilet (per chart)   Additional Comments: Family paying for caregivers.      Prior Functioning/Environment Prior Level of Function : Needs assist             Mobility Comments: Has RW but per chart, pt does not always use it. Caregiver present, reports pt moves very quickly. ADLs Comments: has been needing assist for ADLs with exception of eating since prior hospitalization, typically requires supervision for showering, otherwise independent, facility provides meals and med management    OT Problem List: Decreased strength;Impaired balance (sitting and/or standing);Decreased  cognition;Decreased safety awareness;Decreased activity tolerance;Decreased knowledge of use of DME or AE;Pain   OT Treatment/Interventions: Self-care/ADL training;Patient/family education;Balance training;Therapeutic activities;DME and/or AE instruction      OT Goals(Current goals can be found in the care plan section)   Acute Rehab OT Goals OT Goal Formulation: Patient unable to participate in goal setting Time For Goal Achievement: 05/16/24 Potential to Achieve Goals: Fair ADL Goals Pt Will Perform Eating: with min assist;sitting Pt Will Perform Grooming: with min assist;standing Pt Will Perform Upper Body Dressing: with min assist;sitting Pt Will Transfer to Toilet: with contact guard assist;ambulating;regular height toilet Additional ADL Goal #1: Pt will complete bed mobility with supervision in preparation for ADLs.   OT Frequency:  Min 2X/week    Co-evaluation              AM-PAC OT 6 Clicks Daily Activity     Outcome Measure Help from another person eating meals?: Total Help from another person taking care of personal grooming?: Total Help from another person toileting, which includes using toliet, bedpan, or urinal?: Total Help from another person bathing (including washing, rinsing, drying)?: Total Help from another person to put on and taking off regular upper body clothing?: Total Help from another person to put on and taking off regular lower body clothing?: Total 6 Click Score: 6  End of Session Equipment Utilized During Treatment: Gait belt  Activity Tolerance: Patient tolerated treatment well Patient left: in bed;with call bell/phone within reach;with bed alarm set;with family/visitor present  OT Visit Diagnosis: Unsteadiness on feet (R26.81);Other abnormalities of gait and mobility (R26.89);History of falling (Z91.81);Other symptoms and signs involving cognitive function;Pain                Time: 8661-8641 OT Time Calculation (min): 20 min Charges:   OT General Charges $OT Visit: 1 Visit OT Evaluation $OT Eval Moderate Complexity: 1 Mod  Mliss HERO, OTR/L Acute Rehabilitation Services Office: [phone]   Kennth Mliss Helling 05/02/2024, 2:52 PM

## 2024-05-02 NOTE — Plan of Care (Signed)
 Pt has rested quietly throughout the night with no distress noted. Alert and oriented to self only. On room air. SR on the monitor. Purewick intact to suction. Sitter at bedside. Pt has mitts on d/t pulling at lines. No complaints voiced.     Problem: Clinical Measurements: Goal: Respiratory complications will improve Outcome: Progressing Goal: Cardiovascular complication will be avoided Outcome: Progressing   Problem: Pain Managment: Goal: General experience of comfort will improve and/or be controlled Outcome: Progressing   Problem: Safety: Goal: Ability to remain free from injury will improve Outcome: Progressing   Problem: Skin Integrity: Goal: Risk for impaired skin integrity will decrease Outcome: Progressing

## 2024-05-02 NOTE — Evaluation (Signed)
 Physical Therapy Evaluation Patient Details Name: Kiara Blair MRN: [id number] DOB: Oct 29, 1929 Today's Date: 05/02/2024  History of Present Illness  Pt is a 89 y/o F admitted on 04/30/24 after presenting with c/o falls & AMS. Pt found to have progressive L effusion with loculation & rib fxs. Pt is s/p thoracentesis 05/01/24. PMH: dementia, DM, hypercholesterolemia, HTN, TIA, tremor  Clinical Impression  Pt seen for PT evaluation with pt received in bed, hired caregiver present. Pt reports not feeling well but unable to describe. Pt is oriented to self only, follows simple commands inconsistently with extra time. Pt is able to complete supine>sit with mod assist, sit>supine with min assist. Pt ambulates to bathroom & back with RW & min assist with intermittent assistance for RW management. Pt able to void & have BM with extra time. Pt presents with impaired cognition, balance, awareness & remains a high fall risk. Recommend ongoing PT services to address deficits to reduce fall risk & decrease caregiver burden.        If plan is discharge home, recommend the following: A little help with walking and/or transfers;Help with stairs or ramp for entrance;A lot of help with bathing/dressing/bathroom;Direct supervision/assist for financial management;Assistance with cooking/housework   Can travel by private vehicle   Yes    Equipment Recommendations Other (comment) (defer to next venue)  Recommendations for Other Services       Functional Status Assessment Patient has had a recent decline in their functional status and demonstrates the ability to make significant improvements in function in a reasonable and predictable amount of time.     Precautions / Restrictions Precautions Precautions: Fall Restrictions Weight Bearing Restrictions Per Provider Order: No      Mobility  Bed Mobility Overal bed mobility: Needs Assistance Bed Mobility: Supine to Sit     Supine to sit: Mod assist, HOB  elevated, Used rails (exit L side of bed) Sit to supine: Supervision, HOB elevated, Used rails   General bed mobility comments: +2 to scoot to [facility] with trendelenburg position    Transfers Overall transfer level: Needs assistance Equipment used: Rolling walker (2 wheels), 1 person hand held assist Transfers: Sit to/from Stand Sit to Stand: Min assist, Mod assist, Max assist           General transfer comment: sit>stand from EOB initially with HHA then RW with min assist, sit>stand from low toilet with mod<>max assist, cuing re: hand placement    Ambulation/Gait Ambulation/Gait assistance: Min assist Gait Distance (Feet): 10 Feet (+ 10 ft) Assistive device: Rolling walker (2 wheels) Gait Pattern/deviations: Decreased step length - right, Decreased step length - left, Decreased stride length, Decreased dorsiflexion - right, Decreased dorsiflexion - left Gait velocity: decreased     General Gait Details: assistance for managing RW at times  Stairs            Wheelchair Mobility     Tilt Bed    Modified Rankin (Stroke Patients Only)       Balance Overall balance assessment: Needs assistance, History of Falls Sitting-balance support: Feet supported, Bilateral upper extremity supported Sitting balance-Leahy Scale: Fair Sitting balance - Comments: posterior lean sitting EOB with min assist to correct, sits on toilet with supervision without LOB   Standing balance support: Bilateral upper extremity supported, During functional activity, Reliant on assistive device for balance Standing balance-Leahy Scale: Poor  Pertinent Vitals/Pain Pain Assessment Pain Assessment: Faces Pain Location: chest Pain Descriptors / Indicators: Discomfort, Grimacing Pain Intervention(s):  (made nurse aware, pt reports chest hurts when she moves, believe it to be L sided rib pain as pt points more to L side of chest)    Home Living  Family/patient expects to be discharged to:: Assisted living                 Home Equipment: Shower seat - built in;Grab bars - tub/shower;Rolling Environmental consultant (2 wheels);Cane - single point;Wheelchair - manual;Grab bars - toilet (per chart) Additional Comments: Family paying for caregivers.    Prior Function               Mobility Comments: Has RW but per chart, pt does not always use it. Caregiver present, reports pt moves very quickly.       Extremity/Trunk Assessment   Upper Extremity Assessment Upper Extremity Assessment: Generalized weakness;Difficult to assess due to impaired cognition    Lower Extremity Assessment Lower Extremity Assessment: Generalized weakness;Difficult to assess due to impaired cognition    Cervical / Trunk Assessment Cervical / Trunk Assessment:  (bruising noted to L eye)  Communication   Communication Communication: Impaired Factors Affecting Communication: Reduced clarity of speech    Cognition Arousal: Lethargic, Alert Behavior During Therapy: Impulsive, Restless   PT - Cognitive impairments: Awareness, Memory, Attention, Initiation, Sequencing, Safety/Judgement, Problem solving                       PT - Cognition Comments: Pt oriented to self, not aware of situation, time, location, fidgeting with cords when sitting on toilet, decreased awareness as pt reports she can finish toileting herself without assistance. Following commands: Impaired Following commands impaired: Follows one step commands with increased time, Follows one step commands inconsistently     Cueing Cueing Techniques: Verbal cues, Visual cues     General Comments General comments (skin integrity, edema, etc.): Pt with continent void & BM, requires dependent assist for peri hygiene.    Exercises     Assessment/Plan    PT Assessment Patient needs continued PT services  PT Problem List Decreased strength;Decreased range of motion;Decreased  cognition;Decreased activity tolerance;Decreased knowledge of use of DME;Decreased balance;Decreased safety awareness;Decreased mobility;Decreased coordination;Pain       PT Treatment Interventions DME instruction;Therapeutic exercise;Gait training;Functional mobility training;Therapeutic activities;Patient/family education;Balance training;Modalities;Neuromuscular re-education;Cognitive remediation    PT Goals (Current goals can be found in the Care Plan section)  Acute Rehab PT Goals PT Goal Formulation: Patient unable to participate in goal setting Time For Goal Achievement: 05/16/24 Potential to Achieve Goals: Fair    Frequency Min 2X/week     Co-evaluation               AM-PAC PT 6 Clicks Mobility  Outcome Measure Help needed turning from your back to your side while in a flat bed without using bedrails?: A Little Help needed moving from lying on your back to sitting on the side of a flat bed without using bedrails?: A Lot Help needed moving to and from a bed to a chair (including a wheelchair)?: A Little Help needed standing up from a chair using your arms (e.g., wheelchair or bedside chair)?: A Lot Help needed to walk in hospital room?: A Little Help needed climbing 3-5 steps with a railing? : Total 6 Click Score: 14    End of Session   Activity Tolerance: Patient tolerated treatment well Patient left: in bed;with  call bell/phone within reach;with nursing/sitter in room Nurse Communication: Mobility status PT Visit Diagnosis: Other abnormalities of gait and mobility (R26.89);Muscle weakness (generalized) (M62.81);History of falling (Z91.81);Unsteadiness on feet (R26.81);Difficulty in walking, not elsewhere classified (R26.2)    Time: 1126-1200 PT Time Calculation (min) (ACUTE ONLY): 34 min   Charges:   PT Evaluation $PT Eval Moderate Complexity: 1 Mod   PT General Charges $$ ACUTE PT VISIT: 1 Visit         Richerd Pinal, PT, DPT 05/02/24, 12:37  PM   Richerd CHRISTELLA Pinal 05/02/2024, 12:35 PM

## 2024-05-02 NOTE — Inpatient Diabetes Management (Addendum)
 Inpatient Diabetes Program Recommendations  AACE/ADA: New Consensus Statement on Inpatient Glycemic Control (2015)  Target Ranges:  Prepandial:   less than 140 mg/dL      Peak postprandial:   less than 180 mg/dL (1-2 hours)      Critically ill patients:  140 - 180 mg/dL   Lab Results  Component Value Date   GLUCAP 352 (H) 05/02/2024   HGBA1C 9.8 (H) 04/30/2024    Review of Glycemic Control  Latest Reference Range & Units 04/30/24 10:22 05/01/24 11:47 05/01/24 15:31 05/01/24 19:53 05/02/24 08:48  Glucose-Capillary 70 - 99 mg/dL 720 (H) 696 (H) 842 (H) 302 (H) 352 (H)  (H): Data is abnormally high Diabetes history: DM2 Outpatient Diabetes medications: Kazano (alogliptin-Metformin ) 12.02-999 mg BID, Novolog  5 units TID, Lantus  10 units QD Current orders for Inpatient glycemic control: none   Inpatient Diabetes Program Recommendations:     Semglee  5 units every day (50% of home dose)   Thank you, Wyvonna Pinal, MSN, CDCES Diabetes Coordinator Inpatient Diabetes Program [phone] (team pager from 8a-5p)

## 2024-05-02 NOTE — Progress Notes (Signed)
 PROGRESS NOTE        PATIENT DETAILS Name: Kiara Blair Age: 89 y.o. Sex: female Date of Birth: 1930/02/08 Admit Date: 04/30/2024 Admitting Physician Mario Tobie GAILS, MD ERE:Bnlwh, Tinnie BRAVO, PA  Brief Summary: Patient is a 89 y.o.  female who was hospitalized at [facility] (7/15-7/19) for fall/left seventh rib fracture/trace left pneumothorax-presented to the hospital on 7/23 with multiple falls-found to have numerous new rib fractures, PNA with pleural effusions.  While in the ED-patient developed aphasia-requiring neurology evaluation.  Significant events: 7/23>> admit to [facility].  Significant studies: 7/23>> CT head: No acute intracranial abnormality. 7/23>> CT C-spine: No acute fractures. 7/23>> CT chest/abdomen/pelvis: New left 5th, 6th, 7th and 10th rib fracture.  Moderate left pleural effusion with loculation.  Left lower lobe consolidation. 7/23>> x-ray left knee: No fracture. 7/23>> x-ray left elbow: No fracture. 7/23>> repeat CT head code stroke: No ICH. 7/24>> MRI brain: No acute CVA  Significant microbiology data: 7/24>> pleural fluid culture: Negative  Procedures: 7/24>> thoracocentesis  Consults: Pulmonology Neurology  Subjective: Less confused-spoke with night nurse earlier this morning-slept through the night-no major events.  Objective: Vitals: Blood pressure (!) 175/68, pulse 86, temperature (!) 97.5 F (36.4 C), temperature source Axillary, resp. rate 20, weight 59.4 kg, SpO2 99%.   Exam: Awake/alert-chronically frail appearing-significantly less confused than yesterday.  Speech slow but clear. Chest: Clear to auscultation anteriorly CVS: S1-S2 regular Abdomen: Soft nontender Extremities: No edema Neurology: Nonfocal-but with generalized weakness.   Pertinent Labs/Radiology:    Latest Ref Rng & Units 05/02/2024    5:54 AM 04/30/2024   10:27 AM 04/30/2024   10:24 AM  CBC  WBC 4.0 - 10.5 K/uL 13.1   9.8   Hemoglobin 12.0 - 15.0 g/dL  87.5  86.6  87.7   Hematocrit 36.0 - 46.0 % 37.5  39.0  37.0   Platelets 150 - 400 K/uL 417   344     Lab Results  Component Value Date   NA 135 05/02/2024   K 3.8 05/02/2024   CL 100 05/02/2024   CO2 22 05/02/2024     Assessment/Plan: Recurrent fall with new left left 5th, 6th, 7th and 10th rib fracture Pain seems to be appropriately controlled with scheduled Tylenol  and Lidoderm  patch Minimize narcotics Mobilize Incentive spirometry/flutter valve PT/OT following-dispo session unclear at this point.  PNA  Likely aspiration pneumonia Repeat SLP eval Continue Unasyn   Left-sided pleural effusion-likely hemothorax Likely hemothorax in the setting of falls S/p thoracocentesis Agree with PCCM-Best served by not placing chest tube drainage-as patient with delirium and will likely pull it out. Repeat x-rays of the next several days   Transient aphasia Occurred in the ED Suspicion that this may be related to narcotic administration for pain Aphasia has resolved MRI brain without any acute CVA Doubt any further workup required  Delirium Suspect secondary to PNA/narcotics-likely superimposed on chronic cognitive dysfunction Much better today after sleeping the entire night Continue Seroquel /melatonin. Delirium precautions.  Essential tremor Propranolol   GAD Lexapro   Hyponatremia Mild Follow electrolytes periodically.  HTN BP stable Amlodipine /benazepril . Follow/optimize.  Prior history of TIA Aspirin /statin  DM-2 (A1c 9.8 on 7/23) CBGs now on the higher side-diet has been resumed yesterday afternoon Start Semglee  8 units daily Continue SSI Follow/optimize blood not a candidate for aggressive glycemic control given risk of hypoglycemia.   Recent Labs    05/01/24  1531 05/01/24 1953 05/02/24 0848  GLUCAP 157* 302* 352*    6 mm left upper lobe pulmonary nodule Incidental finding on CT chest Outpatient follow-up  Advance directive/palliative  care Second hospitalization in a week Frail-at risk for further decompensation-see documentation above Spoke with son [name]-other son Ubaldo was on the line as well-all agreeable to DNR status.  Code status:   Code Status: Limited: Do not attempt resuscitation (DNR) -DNR-LIMITED -Do Not Intubate/DNI    DVT Prophylaxis: heparin  injection 5,000 Units Start: 04/30/24 1400   Family Communication: Son-[name]-[phone] updated 7/25   Disposition Plan: Status is: Inpatient Remains inpatient appropriate because: Severity of illness   Planned Discharge Destination:Skilled nursing facility   Diet: Diet Order             Diet regular Room service appropriate? Yes; Fluid consistency: Thin  Diet effective now                     Antimicrobial agents: Anti-infectives (From admission, onward)    Start     Dose/Rate Route Frequency Ordered Stop   05/01/24 1115  Ampicillin -Sulbactam (UNASYN ) 3 g in sodium chloride  0.9 % 100 mL IVPB        3 g 200 mL/hr over 30 Minutes Intravenous Every 6 hours 05/01/24 1017     05/01/24 0700  cefTRIAXone  (ROCEPHIN ) 2 g in sodium chloride  0.9 % 100 mL IVPB  Status:  Discontinued        2 g 200 mL/hr over 30 Minutes Intravenous Every 24 hours 04/30/24 0959 05/01/24 0959   05/01/24 0700  azithromycin  (ZITHROMAX ) 500 mg in sodium chloride  0.9 % 250 mL IVPB  Status:  Discontinued        500 mg 250 mL/hr over 60 Minutes Intravenous Every 24 hours 04/30/24 0959 05/01/24 0959   04/30/24 0645  cefTRIAXone  (ROCEPHIN ) 1 g in sodium chloride  0.9 % 100 mL IVPB        1 g 200 mL/hr over 30 Minutes Intravenous  Once 04/30/24 0641 04/30/24 0733   04/30/24 0645  azithromycin  (ZITHROMAX ) 500 mg in sodium chloride  0.9 % 250 mL IVPB        500 mg 250 mL/hr over 60 Minutes Intravenous  Once 04/30/24 0641 04/30/24 0914        MEDICATIONS: Scheduled Meds:  acetaminophen   1,000 mg Oral Q8H   amLODipine   5 mg Oral Daily   And   benazepril   20 mg Oral Daily    aspirin  EC  81 mg Oral Daily   atorvastatin   40 mg Oral QHS   escitalopram   5 mg Oral QHS   heparin   5,000 Units Subcutaneous Q8H   insulin  aspart  0-9 Units Subcutaneous TID WC   lidocaine   1 patch Transdermal Q24H   melatonin  10 mg Oral QHS   propranolol   10 mg Oral BID   QUEtiapine   25 mg Oral QHS   sodium chloride  flush  3 mL Intravenous Once   Continuous Infusions:  ampicillin -sulbactam (UNASYN ) IV 3 g (05/02/24 0620)   PRN Meds:.albuterol , docusate sodium , guaiFENesin , hydrALAZINE    I have personally reviewed following labs and imaging studies  LABORATORY DATA: CBC: Recent Labs  Lab 04/30/24 0625 04/30/24 0631 04/30/24 1024 04/30/24 1027 05/02/24 0554  WBC 9.9  --  9.8  --  13.1*  NEUTROABS  --   --  6.6  --   --   HGB 12.0 13.6 12.2 13.3 12.4  HCT 35.7* 40.0 37.0 39.0 37.5  MCV  77.4*  --  77.6*  --  77.3*  PLT 406*  --  344  --  417*    Basic Metabolic Panel: Recent Labs  Lab 04/30/24 0625 04/30/24 0631 04/30/24 1024 04/30/24 1027 05/02/24 0554  NA 129* 129* 129* 130* 135  K 4.3 4.4 4.3 4.3 3.8  CL 93* 95* 95* 97* 100  CO2 24  --  23  --  22  GLUCOSE 322* 328* 295* 299* 353*  BUN 18 21 16 18 19   CREATININE 0.81 0.70 0.68 0.70 0.81  CALCIUM  9.5  --  9.2  --  9.6  MG 1.6*  --   --   --   --     GFR: Estimated Creatinine Clearance: 36.7 mL/min (by C-G formula based on SCr of 0.81 mg/dL).  Liver Function Tests: Recent Labs  Lab 04/30/24 0625 04/30/24 1024  AST 26 25  ALT 23 20  ALKPHOS 148* 146*  BILITOT 0.9 0.9  PROT 6.0* 5.9*  ALBUMIN 2.8* 2.8*   No results for input(s): LIPASE, AMYLASE in the last 168 hours. No results for input(s): AMMONIA in the last 168 hours.  Coagulation Profile: Recent Labs  Lab 04/30/24 0625 04/30/24 1024  INR 1.0 1.0    Cardiac Enzymes: No results for input(s): CKTOTAL, CKMB, CKMBINDEX, TROPONINI in the last 168 hours.  BNP (last 3 results) No results for input(s): PROBNP in the  last 8760 hours.  Lipid Profile: Recent Labs    04/30/24 2158  CHOL 135  HDL 65  LDLCALC 48  TRIG 110  CHOLHDL 2.1    Thyroid Function Tests: Recent Labs    04/30/24 1024  TSH 1.325  FREET4 1.30*    Anemia Panel: Recent Labs    04/30/24 1024 04/30/24 2158  VITAMINB12  --  821  FOLATE 17.1  --     Urine analysis:    Component Value Date/Time   COLORURINE STRAW (A) 04/30/2024 1800   APPEARANCEUR CLEAR 04/30/2024 1800   LABSPEC 1.006 04/30/2024 1800   PHURINE 7.0 04/30/2024 1800   GLUCOSEU >=500 (A) 04/30/2024 1800   HGBUR NEGATIVE 04/30/2024 1800   BILIRUBINUR NEGATIVE 04/30/2024 1800   KETONESUR 5 (A) 04/30/2024 1800   PROTEINUR NEGATIVE 04/30/2024 1800   NITRITE NEGATIVE 04/30/2024 1800   LEUKOCYTESUR NEGATIVE 04/30/2024 1800    Sepsis Labs: Lactic Acid, Venous    Component Value Date/Time   LATICACIDVEN 0.77 06/09/2017 1350    MICROBIOLOGY: Recent Results (from the past 240 hours)  Body fluid culture w Gram Stain     Status: None (Preliminary result)   Collection Time: 05/01/24  2:18 PM   Specimen: Pleural Fluid  Result Value Ref Range Status   Specimen Description FLUID PLEURAL  Final   Special Requests NONE  Final   Gram Stain NO WBC SEEN NO ORGANISMS SEEN   Final   Culture   Final    NO GROWTH < 24 HOURS Performed at [facility], [address], [place], [place] [address]    Report Status PENDING  Incomplete    RADIOLOGY STUDIES/RESULTS: MR BRAIN WO CONTRAST Result Date: 05/01/2024 CLINICAL DATA:  Neuro deficit, acute, stroke suspected. EXAM: MRI HEAD WITHOUT CONTRAST TECHNIQUE: Multiplanar, multiecho pulse sequences of the brain and surrounding structures were obtained without intravenous contrast. COMPARISON:  CT head 04/30/2024. FINDINGS: Brain: No acute infarction, hemorrhage, hydrocephalus, extra-axial collection or mass lesion. Mild for age T2/FLAIR hyperintensities in the white matter, compatible with chronic microvascular  ischemic disease. Motion limited study.  Vascular: Normal flow voids. Skull and upper cervical spine: Normal marrow signal. Sinuses/Orbits: Negative. Other: None. IMPRESSION: No evidence of acute abnormality. Electronically Signed   By: Gilmore GORMAN Molt M.D.   On: 05/01/2024 19:28   DG CHEST PORT 1 VIEW Result Date: 05/01/2024 CLINICAL DATA:  Status post thoracentesis. EXAM: PORTABLE CHEST 1 VIEW COMPARISON:  Same day. FINDINGS: Stable cardiomediastinal silhouette. Left rib fractures are again noted. Small left pleural effusion is noted. No pneumothorax is noted. Right lung is clear. IMPRESSION: No pneumothorax is noted status post thoracentesis. Small left pleural effusion remains. Electronically Signed   By: Lynwood Landy Raddle M.D.   On: 05/01/2024 14:48   DG CHEST PORT 1 VIEW Result Date: 05/01/2024 CLINICAL DATA:  Loculated pleural effusion EXAM: PORTABLE CHEST 1 VIEW COMPARISON:  04/23/2024 FINDINGS: Extensive airspace opacity on the left with obscuration of the left heart border and left hemidiaphragm. Some of this may well be from pleural effusion, but substantial atelectasis or basilar pneumonia is not excluded. The right lung is grossly clear. Left rib fractures are identified. Atherosclerotic calcification of the aortic arch. The patient is rotated to the left on today's radiograph, reducing diagnostic sensitivity and specificity. IMPRESSION: 1. Extensive airspace opacity on the left with obscuration of the left heart border and left hemidiaphragm. Some of this may well be from pleural effusion, but substantial atelectasis or basilar pneumonia is not excluded. 2. Left rib fractures. 3. Aortic Atherosclerosis (ICD10-I70.0). Electronically Signed   By: Ryan Salvage M.D.   On: 05/01/2024 08:45     LOS: 2 days   Donalda Applebaum, MD  [facility]    To contact the attending provider between 7A-7P or the covering provider during after hours 7P-7A, please log into the web site  [email or web address] and access using universal [place] password for that web site. If you do not have the password, please call the hospital operator.  05/02/2024, 11:09 AM

## 2024-05-02 NOTE — Consult Note (Signed)
 Consultation Note Date: 05/02/2024   Patient Name: Kiara Blair  DOB: [birth year]  MRN: [id number]  Age / Sex: 89 y.o., female  PCP: Neysa Tinnie BRAVO, PA Referring Physician: Raenelle Donalda HERO, MD  Reason for Consultation: Establishing goals of care  HPI/Patient Profile: 89 y.o. female  with past medical history of  T2DM, HTN, HLD, TIA, essential tremor, GERD, frequent falls c/b rib fractures, anxiety and neuropathy. Admitted on 04/30/2024 due to recent falls and altered mental status.   Of note, the patient has had three hospital admissions in the last six months, including a 7-day readmission. She was recently admitted from 04/22/24 to 04/26/24 due to falls and a rib fracture, and previously from 02/24/24 to 02/26/24 for altered mental status and a complicated UTI.  PMT has been consulted to assist with goals of care conversation. Patient/Family face treatment option decisions, advanced directive decisions and anticipatory care needs.   Clinical Assessment and Goals of Care:  We have reviewed medical records including EPIC notes, labs and imaging, assessed the patient and then spoke with 2 sons (Kiara Blair and Kiara Blair) telephonically to discuss diagnosis prognosis, GOC, EOL wishes, disposition and options.  We introduced Palliative Medicine as specialized medical care for people living with serious illness. It focuses on providing relief from the symptoms and stress of a serious illness. The goal is to improve quality of life for both the patient and the family.   Medical History Review and Family/Patient Understanding:  89 y.o. female  with past medical history of  T2DM, HTN, HLD, TIA, essential tremor, GERD, frequent falls c/b rib fractures, anxiety and neuropathy. Admitted on 04/30/2024 due to recent falls and altered mental status.   Social History: Patient has been a resident of [facility] for over a year now. She appears to be pleasant. She has 4 sons, Kiara Blair Kiara Blair Martinis and Todd. Patient is with baseline dementia.   Functional and Nutritional State:  Caregiver Verneita reports patient is ambulatory with a walker at baseline. Notably has gotten weaker and with limited mobility given the recurrent falls and underlying dementia.   Palliative Symptoms: Altered mental status, weakness, acute pain from recent falls with resulting multiple rib fractures.   Advance Directives: No advance directives on file.    Code Status: DNR-Limited  Discussion:  Today, we visited the patient at her bedside, her private caregiver, Verneita, was feeding her lunch. The patient appears frail and chronically ill, with notable scattered bruises on her forehead and extremities, but she is not in any acute distress. Due to her advanced dementia, she is a relatively poor historian. Verneita mentioned that the patient is eating well and enjoys sweets. We observed cuing and prompting to encourage her to eat, and noted that she falls asleep between bites. The patient was also observed guarding her left chest and showing some facial grimace when trying to move or reposition in bed. Verneita reports that the patient has some control over her bowel and bladder.  We reached out to her two sons, Kiara Blair and Kiara Blair, by phone to discuss goals of care. They both recognize that the current situation is overwhelming, unfamiliar, and scary. They appear to have a good understanding of their mother's current medical condition and her declining health trajectory. They confirmed her DNR status and made it clear that they wish to continue treatment with medication but do not want any aggressive or invasive interventions, such as chest tube placement. They understand that aggressive treatments are unlikely to change the outcome.  The sons shared that the plan after the hospital stay is for the patient to move to the Memory Care unit at [facility], with the hope of providing close supervision to increase  safety and prevent further injuries or falls. We discussed the option of adding hospice services as an additional layer of support, in alignment with the patient's and family's goals of care. Both sons are agreeable to adding hospice services.  The difference between aggressive medical intervention and comfort care was considered in light of the patient's goals of care. Hospice and Palliative Care services outpatient were explained and offered.   Discussed the importance of continued conversation with family and the medical providers regarding overall plan of care and treatment options, ensuring decisions are within the context of the patient's values and GOCs.   Questions and concerns were addressed. The family was encouraged to call with questions or concerns.  PMT will continue to support holistically.   SUMMARY OF RECOMMENDATIONS   Code Status: DNR-Limited, discussed this with sons.  Continue to treat with medicine, but no to aggressive/invasive procedures Symptom management: (Per attending) Continue Acetaminophen  1000mg  PO Q8H for pain Continue Lexapro  5mg  PO every day and Seroquel  25mg  PO every day for dementia-related behaviors.  TOC to assist with Memory Care with Hospice services Provided psycho-social support Continue with holistic palliative support.    Palliative Prophylaxis:  Aspiration, Bowel Regimen, Delirium Protocol, Frequent Pain Assessment, Oral Care, and Turn Reposition  Additional Recommendations (Limitations, Scope, Preferences): No aggressive/invasive procedures   Prognosis:  The prognosis is poor due to the patient's progressive dementia and recurrent falls, which have resulted in multiple rib fractures.  Discharge Planning: Memory Care at [facility], with hospice services.      Primary Diagnoses: Present on Admission: **None**    Physical Exam Vitals and nursing note reviewed.  Constitutional:      Appearance: She is ill-appearing.   Cardiovascular:     Rate and Rhythm: Normal rate.  Pulmonary:     Effort: Pulmonary effort is normal.  Musculoskeletal:        General: Tenderness present.     Comments: Generalized weakness. Left chest wall tenderness.     Skin:    General: Skin is warm and dry.     Findings: Bruising present.     Comments: Multiple scattered bruises, more notable on her left forehead.   Neurological:     Mental Status: Mental status is at baseline. She is disoriented.     Comments: With baseline dementia.       Vital Signs: BP (!) 175/68 (BP Location: Left Arm)   Pulse 86   Temp (!) 97.2 F (36.2 C) (Axillary)   Resp 20   Wt 59.4 kg   SpO2 99%   BMI 23.20 kg/m  Pain Scale: 0-10   Pain Score: 0-No pain   SpO2: SpO2: 99 % O2 Device:SpO2: 99 % O2 Flow Rate: .    Palliative Assessment/Data:40%    Total time: I spent 75 minutes in the care of the patient today in the above activities and documenting the encounter.   Kathlyne JULIANNA Tracie Mickey, NP  Palliative Medicine Team Team phone # [phone]  Thank you for allowing the Palliative Medicine Team to assist in the care of this patient. Please utilize secure chat with additional questions, if there is no response within 30 minutes please call the above phone number.  Palliative Medicine Team providers are available by phone from 7am to 7pm daily and can be  reached through the team cell phone.  Should this patient require assistance outside of these hours, please call the patient's attending physician.

## 2024-05-02 NOTE — Progress Notes (Signed)
 NAME:  Kiara Blair, MRN:  [id number], DOB:  August 27, 1930, LOS: 2 ADMISSION DATE:  04/30/2024, CONSULTATION DATE:  04/30/2024 REFERRING MD:  FORBES Blanch MD, CHIEF COMPLAINT:  Fall, altered mental status, pleural effusion   History of Present Illness:   The patient presents with recent falls and altered mental status. Noted to have progressive left effusion with loculation and associated rib fractures. PCCM consulted for help with management. She is accompanied by her son.  Falls - Multiple recent falls, including one last week with admission and another this morning - This morning's fall occurred while she was in her recliner when her caretaker left the room - Sustained a new bruise and a knot on her head from the most recent fall - CT scan of the head performed upon arrival was normal  Pleural effusion - Had a moderate left sided effusion with loculation and associated rib fractures  Altered mental status, baseline dementia - Acute onset of altered mental status, code stroke called in ED - Initially able to speak but speech was incoherent - Exhibited confusion, including believing her parents were present - Currently unable to open her mouth or respond to questions appropriately  Hyperglycemia - History of elevated blood glucose levels, typically running high  Antithrombotic therapy - Currently taking aspirin  - Not on any anticoagulation therapy  Pertinent  Medical History    has a past medical history of Diabetes mellitus without complication (HCC), Hypercholesteremia, Hypertension, TIA (transient ischemic attack) (01/03/2021), and Tremor.   Significant Hospital Events: Including procedures, antibiotic start and stop dates in addition to other pertinent events   7/23 Admit 7/24 thora   Interim History / Subjective:   Thora yesterday   Spitting out the lunch she was recently fed   Objective    Blood pressure (!) 138/53, pulse (!) 110, temperature 98.2 F (36.8 C),  temperature source Axillary, resp. rate 17, weight 59.4 kg, SpO2 93%.        Intake/Output Summary (Last 24 hours) at 05/02/2024 1421 Last data filed at 05/02/2024 9180 Gross per 24 hour  Intake 120 ml  Output 700 ml  Net -580 ml   Filed Weights   05/02/24 0434  Weight: 59.4 kg    Examination: Gen:      Elderly frail F HEENT:  L facial bruising and abrasion  Lungs:    even unlabored  CV:         cap refill < 3 sec  Abd:      Soft  Ext:    Arthritic changes  Neuro:  Awake   CT chest 04/13/2024-trace left effusion, no fracture, pulmonary nodule CT chest 04/22/2014-new rib fractures, small left effusion with lower lobe atelectasis CT chest 05/01/2019-new rib fractures, progressive moderate left effusion with some loculation, left lower lobe collapse, consolidation, pulmonary nodules fractures  Resolved problem list   Assessment and Plan  Recurrent falls with rib fracture Loculated effusion Possible pneumonia indicated by lower lobe consolidation. Differential includes parapneumonic effusion versus hemothorax P -had ordered a CXR to see if there was residual fluid, with recommendation to avoid invasive procedure unless sx warranted such -since this time, palliative has spoken w family and GOC are no invasive/aggressive tx, no chest tube, no ICU txf. Their hope is to go to memory care destination w 24/7 caregivers and sounds like they are open to hospice support, focus on QOL and safety   PCCM will sign off. Please let us  know if we can be of further assistance  Best Practice (right click and Reselect all SmartList Selections daily)   Per primary team  Signature:    Low MDM   Ronnald Gave MSN, AGACNP-BC [facility] Amion for pager  05/02/2024, 2:21 PM

## 2024-05-02 NOTE — Evaluation (Signed)
 Clinical/Bedside Swallow Evaluation Patient Details  Name: Kiara Blair MRN: [id number] Date of Birth: 1930-06-03  Today's Date: 05/02/2024 Time: SLP Start Time (ACUTE ONLY): 1048 SLP Stop Time (ACUTE ONLY): 1104 SLP Time Calculation (min) (ACUTE ONLY): 16 min  Past Medical History:  Past Medical History:  Diagnosis Date   Diabetes mellitus without complication (HCC)    Hypercholesteremia    Hypertension    TIA (transient ischemic attack) 01/03/2021   Tremor    right arm   Past Surgical History:  Past Surgical History:  Procedure Laterality Date   ABDOMINAL HYSTERECTOMY     APPENDECTOMY     BACK SURGERY     BLADDER SUSPENSION     CHOLECYSTECTOMY     EYE SURGERY     TONSILLECTOMY     HPI:  89 yo female presenting 7/23 with multiple falls. Work up revealed numerous new rib fxs and PNA with pleural effusions s/p thoracentesis 7/24. Pt had transient aphasia in the ED but MRI was negative for acute findings. Previous swallow eval in February 2024 was [facility]. PMH includes: recent hospitalization for fall with rib fx/trace PTX, HTN, DM, CVA, GERD, anxiety, HLD, tremor    Assessment / Plan / Recommendation  Clinical Impression  Pt is confused and not following commands consistently. She is also restless, making it challenging to keep her in an upright position during PO intake. Initial sip of thin liquids resulted in a strong, prolonged coughing episode, concerning for aspiration. SLP provided frequent repositioning to try to keep pt more upright and additional boluses did not elicit the same type of coughing response, although delayed throat clearing was noted. Question if this could be related to persistent irritation from initial episode, but pt would only take a small amount of POs, making assessment somewhat limited.   Pt's caregiver walked in near the end of the evaluation and reports that pt had just recently finished breakfast with no overt difficulties, eating a wide range of  consistencies and drinking thin liquids. She denies coughing. Note that pt also passed a swallow screen on previous date. Education was offered to caregiver about potential for fluctuating status in the setting of AMS as well as the importance of positioning. Pt's current mentation is not conducive to instrumental testing, so would continue with current diet using careful assistance to reduce risk as much as possible. Encouraged caregiver to pause PO intake if further coughing episodes occur. SLP will continue to follow closely considering pulmonary status.   SLP Visit Diagnosis: Dysphagia, unspecified (R13.10)    Aspiration Risk       Diet Recommendation Regular;Thin liquid    Liquid Administration via: Cup;Straw Medication Administration: Whole meds with puree (crush if needed/able) Supervision: Staff to assist with self feeding;Full supervision/cueing for compensatory strategies Compensations: Slow rate;Small sips/bites Postural Changes: Seated upright at 90 degrees;Remain upright for at least 30 minutes after po intake    Other  Recommendations Oral Care Recommendations: Oral care BID     Assistance Recommended at Discharge    Functional Status Assessment Patient has had a recent decline in their functional status and demonstrates the ability to make significant improvements in function in a reasonable and predictable amount of time.  Frequency and Duration min 2x/week  2 weeks       Prognosis Prognosis for improved oropharyngeal function: Good Barriers to Reach Goals: Cognitive deficits      Swallow Study   General HPI: 89 yo female presenting 7/23 with multiple falls. Work up revealed numerous new  rib fxs and PNA with pleural effusions s/p thoracentesis 7/24. Pt had transient aphasia in the ED but MRI was negative for acute findings. Previous swallow eval in February 2024 was [facility]. PMH includes: recent hospitalization for fall with rib fx/trace PTX, HTN, DM, CVA, GERD, anxiety,  HLD, tremor Type of Study: Bedside Swallow Evaluation Previous Swallow Assessment: see HPI Diet Prior to this Study: Regular;Thin liquids (Level 0) Temperature Spikes Noted: No Respiratory Status: Room air History of Recent Intubation: No Behavior/Cognition: Alert;Requires cueing Oral Cavity Assessment: Dry Self-Feeding Abilities: Needs assist Patient Positioning: Other (comment) (needs frequent repositioning) Baseline Vocal Quality: Normal Volitional Cough: Cognitively unable to elicit Volitional Swallow: Unable to elicit    Oral/Motor/Sensory Function Overall Oral Motor/Sensory Function: Other (comment) (difficulty following commands for direct assessment)   Ice Chips Ice chips: Not tested   Thin Liquid Thin Liquid: Impaired Presentation: Straw Pharyngeal  Phase Impairments: Cough - Immediate    Nectar Thick Nectar Thick Liquid: Not tested   Honey Thick Honey Thick Liquid: Not tested   Puree Puree: Within functional limits Presentation: Spoon   Solid     Solid: Not tested      Leita SAILOR., M.A. CCC-SLP Acute Rehabilitation Services Office: [phone]  Secure chat preferred  05/02/2024,11:57 AM

## 2024-05-02 NOTE — TOC Initial Note (Signed)
 Transition of Care [facility]) - Initial/Assessment Note    Patient Details  Name: Kiara Blair MRN: [id number] Date of Birth: 07-Mar-1930  [facility] CM/SW Contact:    Inocente GORMAN Kindle, LCSW Phone Number: 05/02/2024, 4:10 PM  Clinical Narrative:                 Patient admitted from [facility]. TOC continuing to follow. Per Palliative, sons are hoping to have her return with an aide and Hospice.   Expected Discharge Plan: Assisted Living Barriers to Discharge: Continued Medical Work up   Patient Goals and CMS Choice Patient states their goals for this hospitalization and ongoing recovery are:: To return to [facility] CMS Medicare.gov Compare Post Acute Care list provided to:: Patient Represenative (must comment) Choice offered to / list presented to : Adult Children [place] ownership interest in [facility].provided to:: Adult Children    Expected Discharge Plan and Services In-house Referral: Clinical Social Work   Post Acute Care Choice: Hospice Living arrangements for the past 2 months: Assisted Living Facility                                      Prior Living Arrangements/Services Living arrangements for the past 2 months: Assisted Living Facility Lives with:: Facility Resident Patient language and need for interpreter reviewed:: Yes Do you feel safe going back to the place where you live?: Yes      Need for Family Participation in Patient Care: Yes (Comment) Care giver support system in place?: Yes (comment) Current home services: Homehealth aide, DME Criminal Activity/Legal Involvement Pertinent to Current Situation/Hospitalization: No - Comment as needed  Activities of Daily Living      Permission Sought/Granted Permission sought to share information with : Facility Medical sales representative, Family Supports Permission granted to share information with : No  Share Information with NAME: son, Averie Hornbaker @  [phone]  Permission granted to share info w AGENCY: [facility]        Emotional Assessment Appearance:: Appears stated age Attitude/Demeanor/Rapport: Unable to Assess Affect (typically observed): Unable to Assess Orientation: :  (Disoriented x4) Alcohol  / Substance Use: Not Applicable Psych Involvement: No (comment)  Admission diagnosis:  Fall, initial encounter [W19.XXXA] Fall at home, initial encounter [phone].XXXA, Y92.009] Closed fracture of multiple ribs of left side, initial encounter [S22.42XA] Pneumonia of left lower lobe due to infectious organism [J18.9] Patient Active Problem List   Diagnosis Date Noted   Fall at home, initial encounter 04/30/2024   Tendinopathy of left rotator cuff 04/23/2024   Altered mental status 02/24/2024   Lower urinary tract infectious disease 02/24/2024   Fall 02/24/2024   Rib fracture 02/24/2024   Chronic health problem 02/24/2024   Abdominal pain 02/24/2024   Acute metabolic encephalopathy 11/29/2022   Stroke-like symptoms 11/27/2022   Generalized anxiety disorder 03/22/2022   History of CVA (cerebrovascular accident) 01/04/2021   Essential hypertension 01/03/2021   T2DM (type 2 diabetes mellitus) (HCC) 01/03/2021   Paresthesia 05/01/2020   Nuclear sclerotic cataract of left eye 03/29/2018   Sensorineural hearing loss (SNHL) of both ears 03/13/2017   Gastroesophageal reflux disease 06/26/2016   Diabetic polyneuropathy associated with type 2 diabetes mellitus (HCC) 01/10/2016   Essential tremor 10/22/2015   Hyperlipidemia LDL goal <100 10/22/2015   PCP:  Neysa Tinnie BRAVO, PA Pharmacy:   [facility] [phone] [place], [place] - [address]  [address] [place] [address] Phone: [phone] Fax: [phone]  [facility] [address] [place] [address] Phone: [phone] Fax: [phone]  [facility] [address] [place]  [place] [address] Phone: [phone] Fax: [phone]     Social Drivers of Health (SDOH) Social History: SDOH Screenings   Food Insecurity: No Food Insecurity (05/01/2024)  Housing: Low Risk  (05/01/2024)  Transportation Needs: No Transportation Needs (05/01/2024)  Utilities: Not At Risk (05/01/2024)  Financial Resource Strain: Low Risk  (01/02/2024)   Received from [facility]  Physical Activity: Sufficiently Active (06/15/2020)   [facility] from [facility]  Social Connections: Socially Isolated (05/01/2024)  Stress: Stress Concern Present (06/15/2020)   Received from [facility]  Tobacco Use: Low Risk  (04/30/2024)   SDOH Interventions:     Readmission Risk Interventions    04/24/2024   10:30 AM  Readmission Risk Prevention Plan  Transportation Screening Complete  PCP or Specialist Appt within 3-5 Days Complete  HRI or Home Care Consult Complete  Social Work Consult for Recovery Care Planning/Counseling Complete  Palliative Care Screening Not Applicable  Medication Review Oceanographer) Complete

## 2024-05-03 ENCOUNTER — Encounter (HOSPITAL_COMMUNITY): Payer: Self-pay | Admitting: Pulmonary Disease

## 2024-05-03 DIAGNOSIS — J189 Pneumonia, unspecified organism: Secondary | ICD-10-CM | POA: Diagnosis not present

## 2024-05-03 DIAGNOSIS — W19XXXA Unspecified fall, initial encounter: Secondary | ICD-10-CM | POA: Diagnosis not present

## 2024-05-03 DIAGNOSIS — I509 Heart failure, unspecified: Secondary | ICD-10-CM | POA: Diagnosis not present

## 2024-05-03 DIAGNOSIS — S2242XA Multiple fractures of ribs, left side, initial encounter for closed fracture: Secondary | ICD-10-CM | POA: Diagnosis not present

## 2024-05-03 LAB — GLUCOSE, CAPILLARY
Glucose-Capillary: 292 mg/dL — ABNORMAL HIGH (ref 70–99)
Glucose-Capillary: 299 mg/dL — ABNORMAL HIGH (ref 70–99)
Glucose-Capillary: 207 mg/dL — ABNORMAL HIGH (ref 70–99)
Glucose-Capillary: 124 mg/dL — ABNORMAL HIGH (ref 70–99)

## 2024-05-03 MED ORDER — ENSURE PLUS HIGH PROTEIN PO LIQD
237.0000 mL | Freq: Two times a day (BID) | ORAL | Status: DC
  Administered 2024-05-03 – 2024-05-05 (×5): 237 mL via ORAL

## 2024-05-03 MED ORDER — INSULIN GLARGINE-YFGN 100 UNIT/ML ~~LOC~~ SOLN
12.0000 [IU] | Freq: Every day | SUBCUTANEOUS | Status: DC
  Administered 2024-05-04 – 2024-05-05 (×2): 12 [IU] via SUBCUTANEOUS
  Filled 2024-05-03 (×2): qty 0.12

## 2024-05-03 NOTE — Progress Notes (Signed)
 PROGRESS NOTE        PATIENT DETAILS Name: Kiara Blair Age: 89 y.o. Sex: female Date of Birth: 03-27-1930 Admit Date: 04/30/2024 Admitting Physician Mario Tobie GAILS, MD ERE:Bnlwh, Tinnie BRAVO, PA  Brief Summary: Patient is a 89 y.o.  female who was hospitalized at [facility] (7/15-7/19) for fall/left seventh rib fracture/trace left pneumothorax-presented to the hospital on 7/23 with multiple falls-found to have numerous new rib fractures, PNA with pleural effusions.  While in the ED-patient developed aphasia-requiring neurology evaluation.  Significant events: 7/23>> admit to [facility].  Significant studies: 7/23>> CT head: No acute intracranial abnormality. 7/23>> CT C-spine: No acute fractures. 7/23>> CT chest/abdomen/pelvis: New left 5th, 6th, 7th and 10th rib fracture.  Moderate left pleural effusion with loculation.  Left lower lobe consolidation. 7/23>> x-ray left knee: No fracture. 7/23>> x-ray left elbow: No fracture. 7/23>> repeat CT head code stroke: No ICH. 7/24>> MRI brain: No acute CVA  Significant microbiology data: 7/24>> pleural fluid culture: Negative  Procedures: 7/24>> thoracocentesis  Consults: Pulmonology Neurology  Subjective: No major issues overnight-pleasantly confused-per private bedside sitter-patient slept through the night.  Oral intake is still pretty erratic.  Objective: Vitals: Blood pressure (!) 173/56, pulse 67, temperature 97.7 F (36.5 C), temperature source Axillary, resp. rate 18, weight 59.4 kg, SpO2 92%.   Exam: Pleasantly confused-chronically frail appearing. Chest: Clear to auscultation anteriorly CVS: S1-S2 regular Abdomen: Soft nontender nondistended Nonfocal exam but with generalized weakness  Pertinent Labs/Radiology:    Latest Ref Rng & Units 05/02/2024    5:54 AM 04/30/2024   10:27 AM 04/30/2024   10:24 AM  CBC  WBC 4.0 - 10.5 K/uL 13.1   9.8   Hemoglobin 12.0 - 15.0 g/dL 87.5  86.6  87.7   Hematocrit 36.0  - 46.0 % 37.5  39.0  37.0   Platelets 150 - 400 K/uL 417   344     Lab Results  Component Value Date   NA 135 05/02/2024   K 3.8 05/02/2024   CL 100 05/02/2024   CO2 22 05/02/2024     Assessment/Plan: Recurrent fall with new left left 5th, 6th, 7th and 10th rib fracture Pain seems to be appropriately controlled with scheduled Tylenol  and Lidoderm  patch Minimize narcotics Mobilize Incentive spirometry/flutter valve PT/OT following-although SNF being recommended-family prefers patient go back to her ALF with aide and hospice follow-up.  PNA  Likely aspiration pneumonia Appreciate SLP eval-continue regular diet with known aspiration precautions Continue Unasyn   Left-sided pleural effusion-likely hemothorax Likely hemothorax in the setting of falls S/p thoracocentesis Agree with PCCM-Best served by not placing chest tube drainage-as patient with delirium and will likely pull it out. Repeat x-rays of the next several days   Transient aphasia Occurred in the ED Suspicion that this may be related to narcotic administration for pain Aphasia has resolved MRI brain without any acute CVA Doubt any further workup required  Delirium Suspect secondary to PNA/narcotics-likely superimposed on chronic cognitive dysfunction Much better today after sleeping the entire night Continue Seroquel /melatonin. Delirium precautions.  Essential tremor Propranolol   GAD Lexapro   Hyponatremia Mild Follow electrolytes periodically.  HTN BP stable Amlodipine /benazepril . Follow/optimize.  Prior history of TIA Aspirin /statin  DM-2 (A1c 9.8 on 7/23) CBGs remain on the higher side-diet is erratic but overall stable Increase Semglee  to 12 units Continue SSI.  Follow/optimize blood not a candidate for aggressive glycemic control  given risk of hypoglycemia.   Recent Labs    05/02/24 2106 05/03/24 0735 05/03/24 1156  GLUCAP 379* 292* 299*    6 mm left upper lobe pulmonary  nodule Incidental finding on CT chest Outpatient follow-up  Advance directive/palliative care Second hospitalization in a week Frail-at risk for further decompensation-see documentation above DNR in place Palliative care following-goals of care for general medical treatment.  Code status:   Code Status: Limited: Do not attempt resuscitation (DNR) -DNR-LIMITED -Do Not Intubate/DNI    DVT Prophylaxis: heparin  injection 5,000 Units Start: 04/30/24 1400   Family Communication: Son-[name] updated 7/25   Disposition Plan: Status is: Inpatient Remains inpatient appropriate because: Severity of illness   Planned Discharge Destination:Skilled nursing facility   Diet: Diet Order             Diet regular Room service appropriate? Yes; Fluid consistency: Thin  Diet effective now                     Antimicrobial agents: Anti-infectives (From admission, onward)    Start     Dose/Rate Route Frequency Ordered Stop   05/01/24 1115  Ampicillin -Sulbactam (UNASYN ) 3 g in sodium chloride  0.9 % 100 mL IVPB        3 g 200 mL/hr over 30 Minutes Intravenous Every 6 hours 05/01/24 1017     05/01/24 0700  cefTRIAXone  (ROCEPHIN ) 2 g in sodium chloride  0.9 % 100 mL IVPB  Status:  Discontinued        2 g 200 mL/hr over 30 Minutes Intravenous Every 24 hours 04/30/24 0959 05/01/24 0959   05/01/24 0700  azithromycin  (ZITHROMAX ) 500 mg in sodium chloride  0.9 % 250 mL IVPB  Status:  Discontinued        500 mg 250 mL/hr over 60 Minutes Intravenous Every 24 hours 04/30/24 0959 05/01/24 0959   04/30/24 0645  cefTRIAXone  (ROCEPHIN ) 1 g in sodium chloride  0.9 % 100 mL IVPB        1 g 200 mL/hr over 30 Minutes Intravenous  Once 04/30/24 0641 04/30/24 0733   04/30/24 0645  azithromycin  (ZITHROMAX ) 500 mg in sodium chloride  0.9 % 250 mL IVPB        500 mg 250 mL/hr over 60 Minutes Intravenous  Once 04/30/24 0641 04/30/24 0914        MEDICATIONS: Scheduled Meds:  acetaminophen    1,000 mg Oral Q8H   amLODipine   5 mg Oral Daily   And   benazepril   20 mg Oral Daily   aspirin  EC  81 mg Oral Daily   atorvastatin   40 mg Oral QHS   escitalopram   5 mg Oral QHS   feeding supplement  237 mL Oral BID BM   heparin   5,000 Units Subcutaneous Q8H   insulin  aspart  0-5 Units Subcutaneous QHS   insulin  aspart  0-9 Units Subcutaneous TID WC   insulin  glargine-yfgn  8 Units Subcutaneous Daily   lidocaine   1 patch Transdermal Q24H   melatonin  10 mg Oral QHS   propranolol   10 mg Oral BID   QUEtiapine   25 mg Oral QHS   sodium chloride  flush  3 mL Intravenous Once   Continuous Infusions:  ampicillin -sulbactam (UNASYN ) IV 3 g (05/03/24 1148)   PRN Meds:.albuterol , docusate sodium , guaiFENesin , hydrALAZINE    I have personally reviewed following labs and imaging studies  LABORATORY DATA: CBC: Recent Labs  Lab 04/30/24 0625 04/30/24 0631 04/30/24 1024 04/30/24 1027 05/02/24 0554  WBC 9.9  --  9.8  --  13.1*  NEUTROABS  --   --  6.6  --   --   HGB 12.0 13.6 12.2 13.3 12.4  HCT 35.7* 40.0 37.0 39.0 37.5  MCV 77.4*  --  77.6*  --  77.3*  PLT 406*  --  344  --  417*    Basic Metabolic Panel: Recent Labs  Lab 04/30/24 0625 04/30/24 0631 04/30/24 1024 04/30/24 1027 05/02/24 0554  NA 129* 129* 129* 130* 135  K 4.3 4.4 4.3 4.3 3.8  CL 93* 95* 95* 97* 100  CO2 24  --  23  --  22  GLUCOSE 322* 328* 295* 299* 353*  BUN 18 21 16 18 19   CREATININE 0.81 0.70 0.68 0.70 0.81  CALCIUM  9.5  --  9.2  --  9.6  MG 1.6*  --   --   --   --     GFR: Estimated Creatinine Clearance: 36.7 mL/min (by C-G formula based on SCr of 0.81 mg/dL).  Liver Function Tests: Recent Labs  Lab 04/30/24 0625 04/30/24 1024  AST 26 25  ALT 23 20  ALKPHOS 148* 146*  BILITOT 0.9 0.9  PROT 6.0* 5.9*  ALBUMIN 2.8* 2.8*   No results for input(s): LIPASE, AMYLASE in the last 168 hours. No results for input(s): AMMONIA in the last 168 hours.  Coagulation Profile: Recent Labs  Lab  04/30/24 0625 04/30/24 1024  INR 1.0 1.0    Cardiac Enzymes: No results for input(s): CKTOTAL, CKMB, CKMBINDEX, TROPONINI in the last 168 hours.  BNP (last 3 results) No results for input(s): PROBNP in the last 8760 hours.  Lipid Profile: Recent Labs    04/30/24 2158  CHOL 135  HDL 65  LDLCALC 48  TRIG 110  CHOLHDL 2.1    Thyroid Function Tests: No results for input(s): TSH, T4TOTAL, FREET4, T3FREE, THYROIDAB in the last 72 hours.   Anemia Panel: Recent Labs    04/30/24 2158  VITAMINB12 821    Urine analysis:    Component Value Date/Time   COLORURINE STRAW (A) 04/30/2024 1800   APPEARANCEUR CLEAR 04/30/2024 1800   LABSPEC 1.006 04/30/2024 1800   PHURINE 7.0 04/30/2024 1800   GLUCOSEU >=500 (A) 04/30/2024 1800   HGBUR NEGATIVE 04/30/2024 1800   BILIRUBINUR NEGATIVE 04/30/2024 1800   KETONESUR 5 (A) 04/30/2024 1800   PROTEINUR NEGATIVE 04/30/2024 1800   NITRITE NEGATIVE 04/30/2024 1800   LEUKOCYTESUR NEGATIVE 04/30/2024 1800    Sepsis Labs: Lactic Acid, Venous    Component Value Date/Time   LATICACIDVEN 0.77 06/09/2017 1350    MICROBIOLOGY: Recent Results (from the past 240 hours)  Body fluid culture w Gram Stain     Status: None (Preliminary result)   Collection Time: 05/01/24  2:18 PM   Specimen: Pleural Fluid  Result Value Ref Range Status   Specimen Description FLUID PLEURAL  Final   Special Requests NONE  Final   Gram Stain NO WBC SEEN NO ORGANISMS SEEN   Final   Culture   Final    NO GROWTH 2 DAYS Performed at [facility], [address], [place], [place] [address]    Report Status PENDING  Incomplete    RADIOLOGY STUDIES/RESULTS: DG CHEST PORT 1 VIEW Result Date: 05/02/2024 CLINICAL DATA:  33403 Hemothorax 66596 EXAM: PORTABLE CHEST - 1 VIEW COMPARISON:  Multiple, most recently May 01, 2024 FINDINGS: Small left pleural effusion persists with left basilar airspace opacities, likely atelectasis. No  pneumothorax. Mild cardiomegaly. Tortuous aorta  with aortic atherosclerosis. Multiple, mildly displaced left-sided rib fractures again noted. Multilevel thoracic osteophytosis. Osteopenia. IMPRESSION: Similar small left pleural effusion with left basilar atelectasis. No pneumothorax. Electronically Signed   By: Rogelia Myers M.D.   On: 05/02/2024 14:21   DG CHEST PORT 1 VIEW Result Date: 05/01/2024 CLINICAL DATA:  Status post thoracentesis. EXAM: PORTABLE CHEST 1 VIEW COMPARISON:  Same day. FINDINGS: Stable cardiomediastinal silhouette. Left rib fractures are again noted. Small left pleural effusion is noted. No pneumothorax is noted. Right lung is clear. IMPRESSION: No pneumothorax is noted status post thoracentesis. Small left pleural effusion remains. Electronically Signed   By: Lynwood Landy Raddle M.D.   On: 05/01/2024 14:48     LOS: 3 days   Donalda Applebaum, MD  [facility]    To contact the attending provider between 7A-7P or the covering provider during after hours 7P-7A, please log into the web site [email or web address] and access using universal [place] password for that web site. If you do not have the password, please call the hospital operator.  05/03/2024, 12:31 PM

## 2024-05-03 NOTE — Progress Notes (Signed)
 Speech Language Pathology Treatment: Dysphagia  Patient Details Name: Kiara Blair MRN: [id number] DOB: [birth year] Today's Date: 05/03/2024 Time: 8998-8975 SLP Time Calculation (min) (ACUTE ONLY): 23 min  Assessment / Plan / Recommendation Clinical Impression  Pt seen for skilled ST services for PO trials. Upon arrival, nursing was administering pills in puree. She had exterior loss of the pill twice when not monitored- unsure if this was behavioral or oral deficit. When repositioned and cued to use a chin tuck and given a liquid wash, the pt cleared the pill. Pt was pleasantly confused but generally followed directions given moderate verbal cues- SLP assisted with all feeding. The pt was given several sips of thin liquid via straw in isolation- where she took impulsively large sips and had some anterior loss when SLP attempted to reduce oral intake and some signs of GI distress (belching, motioning to her chest). The pt consumed solids with similar GI signs, but no overt s/s of aspiration. Pt had an immediate and prolonged cough when attempting to take thins while still masticating solids. Sitter present in the room given education on compensatory swallow strategies and feeding approaches to reduce discomfort with meals. Pt can continue regular/thin liquid diet given STRICT aspiration/GERD precautions (small bites and sips, eat/drink slowly, no liquids with solids in the oral cavity, intermittent sips of liquid after swallowing solids, sit upright for ALL PO intake and at least 30 minutes following) with full assist with feeding and consistent oral care. Pt may benefit from GI consult, however social work note indicate hospice consideration which may impact POC. SLP to f/u closely to ensure success and carryover of developed approaches.   HPI HPI: 89 yo female presenting 7/23 with multiple falls. Work up revealed numerous new rib fxs and PNA with pleural effusions s/p thoracentesis 7/24. Pt had transient  aphasia in the ED but MRI was negative for acute findings. Previous swallow eval in February 2024 was [facility]. PMH includes: recent hospitalization for fall with rib fx/trace PTX, HTN, DM, CVA, GERD, anxiety, HLD, tremor      SLP Plan  Continue with current plan of care          Recommendations  Medication Administration: Whole meds with puree (Crush if need be, liquids to wash afterwards) Compensations: Slow rate;Small sips/bites;Minimize environmental distractions Postural Changes and/or Swallow Maneuvers: Out of bed for meals;Seated upright 90 degrees;Upright 30-60 min after meal                  Oral care BID   Frequent or constant Supervision/Assistance Dysphagia, unspecified (R13.10)     Continue with current plan of care     Manuelita Blew M.S. CCC-SLP

## 2024-05-03 NOTE — Plan of Care (Signed)
 Pt has rested quietly throughout the night with no distress noted. Alert and oriented to self. On room air. SR on the monitor. Purewick intact to suction. Pt has also been incontinent twice with cleaning and linens changed. No complaints voiced.     Problem: Clinical Measurements: Goal: Respiratory complications will improve Outcome: Progressing Goal: Cardiovascular complication will be avoided Outcome: Progressing   Problem: Coping: Goal: Level of anxiety will decrease Outcome: Progressing   Problem: Pain Managment: Goal: General experience of comfort will improve and/or be controlled Outcome: Progressing

## 2024-05-04 ENCOUNTER — Inpatient Hospital Stay (HOSPITAL_COMMUNITY)

## 2024-05-04 DIAGNOSIS — Y92009 Unspecified place in unspecified non-institutional (private) residence as the place of occurrence of the external cause: Secondary | ICD-10-CM | POA: Diagnosis not present

## 2024-05-04 DIAGNOSIS — W19XXXA Unspecified fall, initial encounter: Secondary | ICD-10-CM | POA: Diagnosis not present

## 2024-05-04 DIAGNOSIS — S0012XA Contusion of left eyelid and periocular area, initial encounter: Secondary | ICD-10-CM | POA: Diagnosis not present

## 2024-05-04 LAB — CBC WITH DIFFERENTIAL/PLATELET
Abs Immature Granulocytes: 0.09 K/uL — ABNORMAL HIGH (ref 0.00–0.07)
Basophils Absolute: 0 K/uL (ref 0.0–0.1)
Basophils Relative: 0 %
Eosinophils Absolute: 0.4 K/uL (ref 0.0–0.5)
Eosinophils Relative: 4 %
HCT: 32.3 % — ABNORMAL LOW (ref 36.0–46.0)
Hemoglobin: 10.4 g/dL — ABNORMAL LOW (ref 12.0–15.0)
Immature Granulocytes: 1 %
Lymphocytes Relative: 17 %
Lymphs Abs: 1.8 K/uL (ref 0.7–4.0)
MCH: 24.9 pg — ABNORMAL LOW (ref 26.0–34.0)
MCHC: 32.2 g/dL (ref 30.0–36.0)
MCV: 77.3 fL — ABNORMAL LOW (ref 80.0–100.0)
Monocytes Absolute: 1.1 K/uL — ABNORMAL HIGH (ref 0.1–1.0)
Monocytes Relative: 10 %
Neutro Abs: 7.5 K/uL (ref 1.7–7.7)
Neutrophils Relative %: 68 %
Platelets: 389 K/uL (ref 150–400)
RBC: 4.18 MIL/uL (ref 3.87–5.11)
RDW: 16.2 % — ABNORMAL HIGH (ref 11.5–15.5)
WBC: 10.9 K/uL — ABNORMAL HIGH (ref 4.0–10.5)
nRBC: 0 % (ref 0.0–0.2)

## 2024-05-04 LAB — BODY FLUID CULTURE W GRAM STAIN
Culture: NO GROWTH
Gram Stain: NONE SEEN

## 2024-05-04 LAB — GLUCOSE, CAPILLARY
Glucose-Capillary: 224 mg/dL — ABNORMAL HIGH (ref 70–99)
Glucose-Capillary: 268 mg/dL — ABNORMAL HIGH (ref 70–99)
Glucose-Capillary: 255 mg/dL — ABNORMAL HIGH (ref 70–99)

## 2024-05-04 LAB — BASIC METABOLIC PANEL WITH GFR
Anion gap: 8 (ref 5–15)
BUN: 23 mg/dL (ref 8–23)
CO2: 26 mmol/L (ref 22–32)
Calcium: 8.7 mg/dL — ABNORMAL LOW (ref 8.9–10.3)
Chloride: 102 mmol/L (ref 98–111)
Creatinine, Ser: 0.65 mg/dL (ref 0.44–1.00)
GFR, Estimated: >60 mL/min (ref >60)
Glucose, Bld: 200 mg/dL — ABNORMAL HIGH (ref 70–99)
Potassium: 3.2 mmol/L — ABNORMAL LOW (ref 3.5–5.1)
Sodium: 136 mmol/L (ref 135–145)

## 2024-05-04 LAB — PHOSPHORUS: Phosphorus: 3.2 mg/dL (ref 2.5–4.6)

## 2024-05-04 LAB — MAGNESIUM: Magnesium: 1.4 mg/dL — ABNORMAL LOW (ref 1.7–2.4)

## 2024-05-04 MED ORDER — POTASSIUM CHLORIDE CRYS ER 20 MEQ PO TBCR
40.0000 meq | EXTENDED_RELEASE_TABLET | Freq: Two times a day (BID) | ORAL | Status: AC
  Administered 2024-05-04 (×2): 40 meq via ORAL
  Filled 2024-05-04 (×2): qty 2

## 2024-05-04 MED ORDER — MAGNESIUM SULFATE 4 GM/100ML IV SOLN
4.0000 g | Freq: Once | INTRAVENOUS | Status: AC
  Administered 2024-05-04: 4 g via INTRAVENOUS
  Filled 2024-05-04: qty 100

## 2024-05-04 NOTE — Plan of Care (Signed)
 Pt has rested quietly throughout the night with no distress noted. Alert and oriented to self. On room air. Respirations even and unlabored. No SOA noted. SR on the monitor. Sitter at bedside. No complaints voiced.     Problem: Clinical Measurements: Goal: Ability to maintain clinical measurements within normal limits will improve Outcome: Progressing Goal: Respiratory complications will improve Outcome: Progressing Goal: Cardiovascular complication will be avoided Outcome: Progressing   Problem: Nutrition: Goal: Adequate nutrition will be maintained Outcome: Progressing   Problem: Pain Managment: Goal: General experience of comfort will improve and/or be controlled Outcome: Progressing

## 2024-05-04 NOTE — TOC Progression Note (Signed)
 Transition of Care [facility]) - Progression Note    Patient Details  Name: Kiara Blair MRN: [id number] Date of Birth: [birth year]  Transition [facility]) CM/SW Contact  Bridget Cordella Simmonds, LCSW Phone Number: 05/04/2024, 12:17 PM  Clinical Narrative:   Pt oriented x1, CSW spoke briefly with her and [facility] aide also in the room.  Pt pleasant, able to identify her sons [name].  CSW spoke with charlie by phone.  He reports that pt has already been moved from ALF at [facility] to their memory care unit.  [name] also reports that he spoke to a rep for Hospice/palliative care, could not recall which agency, but they will also follow at [place].      Expected Discharge Plan: Assisted Living Barriers to Discharge: Continued Medical Work up               Expected Discharge Plan and Services In-house Referral: Clinical Social Work   Post Acute Care Choice: Hospice Living arrangements for the past 2 months: Assisted Living Facility                                       Social Drivers of Health (SDOH) Interventions SDOH Screenings   Food Insecurity: No Food Insecurity (05/01/2024)  Housing: Low Risk  (05/01/2024)  Transportation Needs: No Transportation Needs (05/01/2024)  Utilities: Not At Risk (05/01/2024)  Financial Resource Strain: Low Risk  (01/02/2024)   Received from [facility]  Physical Activity: Sufficiently Active (06/15/2020)   Received from [facility]  Social Connections: Socially Isolated (05/01/2024)  Stress: Stress Concern Present (06/15/2020)   Received from [facility]  Tobacco Use: Low Risk  (04/30/2024)    Readmission Risk Interventions    04/24/2024   10:30 AM  Readmission Risk Prevention Plan  Transportation Screening Complete  PCP or Specialist Appt within 3-5 Days Complete  HRI or Home Care Consult Complete  Social Work Consult for Recovery Care Planning/Counseling Complete  Palliative Care Screening Not Applicable  Medication Review Special educational needs teacher) Complete

## 2024-05-04 NOTE — Progress Notes (Signed)
 PROGRESS NOTE        PATIENT DETAILS Name: Kiara Blair Age: 89 y.o. Sex: female Date of Birth: Jan 25, 1930 Admit Date: 04/30/2024 Admitting Physician Mario Tobie GAILS, MD ERE:Bnlwh, Tinnie BRAVO, PA  Brief Summary: Patient is a 89 y.o.  female who was hospitalized at [facility] (7/15-7/19) for fall/left seventh rib fracture/trace left pneumothorax-presented to the hospital on 7/23 with multiple falls-found to have numerous new rib fractures, PNA with pleural effusions.  While in the ED-patient developed aphasia-requiring neurology evaluation.  Significant events: 7/23>> admit to [facility].  Significant studies: 7/23>> CT head: No acute intracranial abnormality. 7/23>> CT C-spine: No acute fractures. 7/23>> CT chest/abdomen/pelvis: New left 5th, 6th, 7th and 10th rib fracture.  Moderate left pleural effusion with loculation.  Left lower lobe consolidation. 7/23>> x-ray left knee: No fracture. 7/23>> x-ray left elbow: No fracture. 7/23>> repeat CT head code stroke: No ICH. 7/24>> MRI brain: No acute CVA  Significant microbiology data: 7/24>> pleural fluid culture: Negative  Procedures: 7/24>> thoracocentesis  Consults: Pulmonology Neurology  Subjective:  Patient in bed, appears comfortable, denies any headache, no fever, no chest pain or pressure, no shortness of breath , no abdominal pain. No new focal weakness.   Objective: Vitals: Blood pressure (!) 148/52, pulse 75, temperature 97.8 F (36.6 C), temperature source Axillary, resp. rate 16, weight 59.4 kg, SpO2 95%.   Exam:  Awake Alert, No new F.N deficits, Normal affect [place].AT,PERRAL Supple Neck, No JVD,   Symmetrical Chest wall movement, Good air movement bilaterally, CTAB RRR,No Gallops, Rubs or new Murmurs,  +ve B.Sounds, Abd Soft, No tenderness,   No Cyanosis, Clubbing or edema   Assessment/Plan:  Recurrent fall with new left left 5th, 6th, 7th and 10th rib fracture Pain seems to be appropriately  controlled with scheduled Tylenol  and Lidoderm  patch Minimize narcotics Mobilize Incentive spirometry/flutter valve PT/OT following-although SNF being recommended-family prefers patient go back to her ALF with aide and hospice follow-up.  PNA  Likely aspiration pneumonia Appreciate SLP eval-continue regular diet with known aspiration precautions Continue Unasyn   Left-sided pleural effusion-likely hemothorax Likely hemothorax in the setting of falls S/p thoracocentesis Agree with PCCM-Best served by not placing chest tube drainage-as patient with delirium and will likely pull it out. Repeat x-rays of the next several days   Transient aphasia Occurred in the ED Suspicion that this may be related to narcotic administration for pain Aphasia has resolved MRI brain without any acute CVA Doubt any further workup required  Delirium Suspect secondary to PNA/narcotics-likely superimposed on chronic cognitive dysfunction Much better today after sleeping the entire night Continue Seroquel /melatonin. Delirium precautions.  Essential tremor Propranolol   GAD Lexapro   Hyponatremia, hypokalemia, hypomagnesemia Mild Hydrate for hyponatremia, replace potassium and magnesium .  HTN BP stable Amlodipine /benazepril . Follow/optimize.  Prior history of TIA Aspirin /statin  DM-2 (A1c 9.8 on 7/23) CBGs remain on the higher side-diet is erratic but overall stable Increase Semglee  to 12 units Continue SSI.  Follow/optimize blood not a candidate for aggressive glycemic control given risk of hypoglycemia.   Recent Labs    05/03/24 1626 05/03/24 2123 05/04/24 0755  GLUCAP 207* 124* 224*    6 mm left upper lobe pulmonary nodule Incidental finding on CT chest Outpatient follow-up  Advance directive/palliative care Second hospitalization in a week Frail-at risk for further decompensation-see documentation above DNR in place Palliative care following-goals of care for general medical  treatment.  Code status:   Code Status: Limited: Do not attempt resuscitation (DNR) -DNR-LIMITED -Do Not Intubate/DNI    DVT Prophylaxis: heparin  injection 5,000 Units Start: 04/30/24 1400   Family Communication: Son-[name]-[phone] updated 7/25   Disposition Plan: Status is: Inpatient Remains inpatient appropriate because: Severity of illness   Planned Discharge Destination:Skilled nursing facility   Diet: Diet Order             Diet regular Room service appropriate? Yes; Fluid consistency: Thin  Diet effective now                     Antimicrobial agents: Anti-infectives (From admission, onward)    Start     Dose/Rate Route Frequency Ordered Stop   05/01/24 1115  Ampicillin -Sulbactam (UNASYN ) 3 g in sodium chloride  0.9 % 100 mL IVPB        3 g 200 mL/hr over 30 Minutes Intravenous Every 6 hours 05/01/24 1017     05/01/24 0700  cefTRIAXone  (ROCEPHIN ) 2 g in sodium chloride  0.9 % 100 mL IVPB  Status:  Discontinued        2 g 200 mL/hr over 30 Minutes Intravenous Every 24 hours 04/30/24 0959 05/01/24 0959   05/01/24 0700  azithromycin  (ZITHROMAX ) 500 mg in sodium chloride  0.9 % 250 mL IVPB  Status:  Discontinued        500 mg 250 mL/hr over 60 Minutes Intravenous Every 24 hours 04/30/24 0959 05/01/24 0959   04/30/24 0645  cefTRIAXone  (ROCEPHIN ) 1 g in sodium chloride  0.9 % 100 mL IVPB        1 g 200 mL/hr over 30 Minutes Intravenous  Once 04/30/24 0641 04/30/24 0733   04/30/24 0645  azithromycin  (ZITHROMAX ) 500 mg in sodium chloride  0.9 % 250 mL IVPB        500 mg 250 mL/hr over 60 Minutes Intravenous  Once 04/30/24 0641 04/30/24 0914        MEDICATIONS: Scheduled Meds:  acetaminophen   1,000 mg Oral Q8H   amLODipine   5 mg Oral Daily   And   benazepril   20 mg Oral Daily   aspirin  EC  81 mg Oral Daily   atorvastatin   40 mg Oral QHS   escitalopram   5 mg Oral QHS   feeding supplement  237 mL Oral BID BM   heparin   5,000 Units Subcutaneous Q8H    insulin  aspart  0-5 Units Subcutaneous QHS   insulin  aspart  0-9 Units Subcutaneous TID WC   insulin  glargine-yfgn  12 Units Subcutaneous Daily   lidocaine   1 patch Transdermal Q24H   melatonin  10 mg Oral QHS   potassium chloride   40 mEq Oral BID   propranolol   10 mg Oral BID   QUEtiapine   25 mg Oral QHS   sodium chloride  flush  3 mL Intravenous Once   Continuous Infusions:  ampicillin -sulbactam (UNASYN ) IV 3 g (05/04/24 0555)   magnesium  sulfate bolus IVPB     PRN Meds:.albuterol , docusate sodium , guaiFENesin , hydrALAZINE    I have personally reviewed following labs and imaging studies  LABORATORY DATA: CBC: Recent Labs  Lab 04/30/24 0625 04/30/24 0631 04/30/24 1024 04/30/24 1027 05/02/24 0554 05/04/24 0606  WBC 9.9  --  9.8  --  13.1* 10.9*  NEUTROABS  --   --  6.6  --   --  7.5  HGB 12.0 13.6 12.2 13.3 12.4 10.4*  HCT 35.7* 40.0 37.0 39.0 37.5 32.3*  MCV 77.4*  --  77.6*  --  77.3* 77.3*  PLT 406*  --  344  --  417* 389    Basic Metabolic Panel: Recent Labs  Lab 04/30/24 0625 04/30/24 0631 04/30/24 1024 04/30/24 1027 05/02/24 0554 05/04/24 0606  NA 129* 129* 129* 130* 135 136  K 4.3 4.4 4.3 4.3 3.8 3.2*  CL 93* 95* 95* 97* 100 102  CO2 24  --  23  --  22 26  GLUCOSE 322* 328* 295* 299* 353* 200*  BUN 18 21 16 18 19 23   CREATININE 0.81 0.70 0.68 0.70 0.81 0.65  CALCIUM  9.5  --  9.2  --  9.6 8.7*  MG 1.6*  --   --   --   --  1.4*  PHOS  --   --   --   --   --  3.2    GFR: Estimated Creatinine Clearance: 37.1 mL/min (by C-G formula based on SCr of 0.65 mg/dL).  Liver Function Tests: Recent Labs  Lab 04/30/24 0625 04/30/24 1024  AST 26 25  ALT 23 20  ALKPHOS 148* 146*  BILITOT 0.9 0.9  PROT 6.0* 5.9*  ALBUMIN 2.8* 2.8*   No results for input(s): LIPASE, AMYLASE in the last 168 hours. No results for input(s): AMMONIA in the last 168 hours.  Coagulation Profile: Recent Labs  Lab 04/30/24 0625 04/30/24 1024  INR 1.0 1.0    Cardiac  Enzymes: No results for input(s): CKTOTAL, CKMB, CKMBINDEX, TROPONINI in the last 168 hours.  BNP (last 3 results) No results for input(s): PROBNP in the last 8760 hours.  Lipid Profile: No results for input(s): CHOL, HDL, LDLCALC, TRIG, CHOLHDL, LDLDIRECT in the last 72 hours.   Thyroid Function Tests: No results for input(s): TSH, T4TOTAL, FREET4, T3FREE, THYROIDAB in the last 72 hours.   Anemia Panel: No results for input(s): VITAMINB12, FOLATE, FERRITIN, TIBC, IRON, RETICCTPCT in the last 72 hours.   Urine analysis:    Component Value Date/Time   COLORURINE STRAW (A) 04/30/2024 1800   APPEARANCEUR CLEAR 04/30/2024 1800   LABSPEC 1.006 04/30/2024 1800   PHURINE 7.0 04/30/2024 1800   GLUCOSEU >=500 (A) 04/30/2024 1800   HGBUR NEGATIVE 04/30/2024 1800   BILIRUBINUR NEGATIVE 04/30/2024 1800   KETONESUR 5 (A) 04/30/2024 1800   PROTEINUR NEGATIVE 04/30/2024 1800   NITRITE NEGATIVE 04/30/2024 1800   LEUKOCYTESUR NEGATIVE 04/30/2024 1800    Sepsis Labs: Lactic Acid, Venous    Component Value Date/Time   LATICACIDVEN 0.77 06/09/2017 1350    MICROBIOLOGY: Recent Results (from the past 240 hours)  Body fluid culture w Gram Stain     Status: None   Collection Time: 05/01/24  2:18 PM   Specimen: Pleural Fluid  Result Value Ref Range Status   Specimen Description FLUID PLEURAL  Final   Special Requests NONE  Final   Gram Stain NO WBC SEEN NO ORGANISMS SEEN   Final   Culture   Final    NO GROWTH 3 DAYS Performed at [facility], [address], [place], [place] [address]    Report Status 05/04/2024 FINAL  Final    RADIOLOGY STUDIES/RESULTS: DG Chest Port 1 View Result Date: 05/04/2024 CLINICAL DATA:  [id number]. Shortness of breath, multiple left rib fractures. 33403.  Hemothorax. EXAM: PORTABLE CHEST 1 VIEW COMPARISON:  Portable chest 05/02/2024 at 1:56 p.m. FINDINGS: 6:21 a.m. Multiple displaced left rib cage fractures  are again noted. Today there is increased, near circumferential left pleural fluid or blood thickening the pleural margin up to 2  cm all the way to the apex. There is increased patchy consolidation in the left lower lung field. There is no measurable pneumothorax. There is increased faint haziness in the right upper lobe mid field which could be developing pneumonia. Remainder of the lungs appear clear. The cardiac size is stable. The aorta is heavily calcified. No new skeletal abnormality is seen. In all other respects, no further changes. IMPRESSION: 1. Increased, near circumferential left pleural fluid or blood thickening the pleural reflection up to 2 cm all the way to the apex. Consider CT with IV contrast to assess for active bleeding source. 2. Increased patchy consolidation in the left lower lung field. 3. Increased faint haziness in the right upper lobe mid field which could be developing pneumonia. 4. Multiple recent displaced left rib cage fractures. 5. Aortic atherosclerosis. 6. These results will be called to the ordering clinician or representative by the Radiologist Assistant, and communication documented in the PACS or [employer]. Electronically Signed   By: [name] Quam M.D.   On: 05/04/2024 07:05   DG CHEST PORT 1 VIEW Result Date: 05/02/2024 CLINICAL DATA:  [id number] Hemothorax 33403 EXAM: PORTABLE CHEST - 1 VIEW COMPARISON:  Multiple, most recently May 01, 2024 FINDINGS: Small left pleural effusion persists with left basilar airspace opacities, likely atelectasis. No pneumothorax. Mild cardiomegaly. Tortuous aorta with aortic atherosclerosis. Multiple, mildly displaced left-sided rib fractures again noted. Multilevel thoracic osteophytosis. Osteopenia. IMPRESSION: Similar small left pleural effusion with left basilar atelectasis. No pneumothorax. Electronically Signed   By: Rogelia Myers M.D.   On: 05/02/2024 14:21     LOS: 4 days   Lavada Stank, MD  [facility]    To  contact the attending provider between 7A-7P or the covering provider during after hours 7P-7A, please log into the web site [email or web address] and access using universal [place] password for that web site. If you do not have the password, please call the hospital operator.  05/04/2024, 8:36 AM

## 2024-05-04 NOTE — Care Plan (Signed)
 Patient up to chair for lunch  Kiara Blair 24 hour aid present with patient

## 2024-05-04 NOTE — Care Plan (Signed)
 Mag 1.4 K 3.2 / MD aware

## 2024-05-05 DIAGNOSIS — W19XXXA Unspecified fall, initial encounter: Secondary | ICD-10-CM | POA: Diagnosis not present

## 2024-05-05 DIAGNOSIS — S0012XA Contusion of left eyelid and periocular area, initial encounter: Secondary | ICD-10-CM | POA: Diagnosis not present

## 2024-05-05 DIAGNOSIS — Y92009 Unspecified place in unspecified non-institutional (private) residence as the place of occurrence of the external cause: Secondary | ICD-10-CM | POA: Diagnosis not present

## 2024-05-05 LAB — GLUCOSE, CAPILLARY
Glucose-Capillary: 257 mg/dL — ABNORMAL HIGH (ref 70–99)
Glucose-Capillary: 402 mg/dL — ABNORMAL HIGH (ref 70–99)
Glucose-Capillary: 173 mg/dL — ABNORMAL HIGH (ref 70–99)
Glucose-Capillary: 21 mg/dL — CL (ref 70–99)
Glucose-Capillary: 254 mg/dL — ABNORMAL HIGH (ref 70–99)

## 2024-05-05 LAB — CYTOLOGY - NON PAP

## 2024-05-05 MED ORDER — DEXTROSE 50 % IV SOLN
INTRAVENOUS | Status: AC
  Administered 2024-05-05: 25 g via INTRAVENOUS
  Filled 2024-05-05: qty 50

## 2024-05-05 MED ORDER — INSULIN ASPART 100 UNIT/ML IJ SOLN
18.0000 [IU] | Freq: Once | INTRAMUSCULAR | Status: AC
  Administered 2024-05-05: 18 [IU] via SUBCUTANEOUS

## 2024-05-05 MED ORDER — INSULIN ASPART 100 UNIT/ML IJ SOLN
0.0000 [IU] | Freq: Three times a day (TID) | INTRAMUSCULAR | Status: DC
  Administered 2024-05-06: 3 [IU] via SUBCUTANEOUS
  Administered 2024-05-06: 8 [IU] via SUBCUTANEOUS

## 2024-05-05 MED ORDER — INSULIN GLARGINE-YFGN 100 UNIT/ML ~~LOC~~ SOLN
12.0000 [IU] | Freq: Two times a day (BID) | SUBCUTANEOUS | Status: DC
  Filled 2024-05-05: qty 0.12

## 2024-05-05 MED ORDER — GLUCERNA SHAKE PO LIQD
237.0000 mL | Freq: Two times a day (BID) | ORAL | Status: DC
  Administered 2024-05-05: 237 mL via ORAL

## 2024-05-05 MED ORDER — INSULIN ASPART 100 UNIT/ML IJ SOLN: 0.0000 [IU] | Freq: Every day | INTRAMUSCULAR | Status: DC

## 2024-05-05 MED ORDER — DEXTROSE 50 % IV SOLN: 25.0000 g | INTRAVENOUS | Status: AC

## 2024-05-05 NOTE — Consult Note (Signed)
 WOC Nurse Consult Note: Reason for Consult:Multiple falls at SNF.  Bruising to head, arms legs and torso noted in various stages of healing  Patient is confused but pleasant.  Sitter at bedside for safety. Asked to consult for coccyx wound.  Wound type:Moisture and pressure  Unstageable pressure injury to sacrococcygeal area.  Pressure Injury POA: Yes Measurement: 2 cm x 0.3 cm  Wound bed:100% thin fibrin Drainage (amount, consistency, odor) minimal serosanguinous   Periwound: wound is located in a skin crease at the apex of the gluteal cleft.  Frequently moist Dressing procedure/placement/frequency: Cleanse coccyx wound with NS and pat dry. Apply small piece of alginate (LAWSON # [id number])  for absorption and top with sacral foam.  Keep area clean and dry.  Change every other day.  Will not follow at this time.  Please re-consult if needed.  Darice Cooley MSN, RN, FNP-BC CWON Wound, Ostomy, Continence Nurse Outpatient [facility] [phone] Pager [phone]

## 2024-05-05 NOTE — Progress Notes (Signed)
 Physical Therapy Treatment Patient Details Name: Kiara Blair MRN: [id number] DOB: 1930/01/23 Today's Date: 05/05/2024   History of Present Illness Pt is a 89 y/o F admitted on 04/30/24 after presenting with c/o falls & AMS. Pt found to have progressive L effusion with loculation & rib fxs. Pt is s/p thoracentesis 05/01/24. PMH: dementia, DM, hypercholesterolemia, HTN, TIA, tremor    PT Comments  Pt seen for PT tx with pt agreeable, paid personal sitter present in room. Pt with poor awareness & memory, does not recall most recent L rib fxs, but does c/o pain with bed mobility. Pt is able to ambulate in room with RW & min assist but requests to not walk in hallway on this date despite encouragement. Pt tolerates standing at sink ~5 minutes to engage in grooming tasks with education/cuing re: positioning of RW when standing at sink. Pt with slight R lateral lean during standing/gait. Pt would benefit from ongoing PT services to progress mobility as able & reduce fall risk.   BP checked in LUE: In bed: 174/59 (92) Sitting EOB: 174/67 (95) Standing at 0: 148/68 (90)    If plan is discharge home, recommend the following: A little help with walking and/or transfers;Help with stairs or ramp for entrance;A lot of help with bathing/dressing/bathroom;Direct supervision/assist for financial management;Assistance with cooking/housework   Can travel by private vehicle     Yes  Equipment Recommendations  Rolling walker (2 wheels);BSC/3in1;Wheelchair (measurements PT);Wheelchair cushion (measurements PT)    Recommendations for Other Services       Precautions / Restrictions Precautions Precautions: Fall Recall of Precautions/Restrictions: Impaired Precaution/Restrictions Comments: hx pf multiple falls, memory impairments, hx of dizziness Restrictions Weight Bearing Restrictions Per Provider Order: No     Mobility  Bed Mobility Overal bed mobility: Needs Assistance Bed Mobility: Sidelying to Sit,  Rolling Rolling: Max assist Sidelying to sit: Max assist       General bed mobility comments: pt exited R side of bed, limited by L rib pain    Transfers Overall transfer level: Needs assistance Equipment used: Rolling walker (2 wheels) Transfers: Sit to/from Stand Sit to Stand: Min assist           General transfer comment: sit>stand from EOB with RW    Ambulation/Gait Ambulation/Gait assistance: Min assist Gait Distance (Feet): 25 Feet Assistive device: Rolling walker (2 wheels) Gait Pattern/deviations: Decreased step length - right, Decreased step length - left, Decreased stride length, Decreased dorsiflexion - right, Decreased dorsiflexion - left Gait velocity: decreased     General Gait Details: around bed to door, to sink, to Engineer, production     Tilt Bed    Modified Rankin (Stroke Patients Only)       Balance Overall balance assessment: Needs assistance, History of Falls Sitting-balance support: Feet supported, Bilateral upper extremity supported Sitting balance-Leahy Scale: Fair Sitting balance - Comments: supervision sitting EOB   Standing balance support: No upper extremity supported, During functional activity Standing balance-Leahy Scale: Poor Standing balance comment: standing at sink without BUE support with min assist. Pt tolerated standing at sink ~5 minutes to engage in grooming tasks (washing face, combing hair)                            Communication Communication Communication: Impaired Factors Affecting Communication: Reduced clarity of speech  Cognition Arousal: Alert Behavior During Therapy: Impulsive  PT - Cognitive impairments: Awareness, Memory, Attention, Initiation, Sequencing, Safety/Judgement, Problem solving, Orientation                       PT - Cognition Comments: Pt responds to name, follows simple commands with extra time, unaware of incontinent BM while  standing at sink, decreased safety awareness, does not recall recent rib fxs, continues to report she broke her ribs last year Following commands: Impaired Following commands impaired: Follows one step commands with increased time, Follows one step commands inconsistently    Cueing Cueing Techniques: Verbal cues, Visual cues  Exercises      General Comments General comments (skin integrity, edema, etc.): Pt with incontinent BM standing at sink & pt unaware. Dependent assist for peri hygiene.      Pertinent Vitals/Pain Pain Assessment Pain Assessment: Faces Faces Pain Scale: Hurts whole lot Pain Location: L side of chest, pt c/o my heart hurts but nurse in room & aware, believe pt to have L sided rib pain Pain Descriptors / Indicators: Discomfort, Grimacing, Sharp Pain Intervention(s): Monitored during session, Limited activity within patient's tolerance, Repositioned    Home Living                          Prior Function            PT Goals (current goals can now be found in the care plan section) Acute Rehab PT Goals Patient Stated Goal: none stated PT Goal Formulation: Patient unable to participate in goal setting Time For Goal Achievement: 05/16/24 Potential to Achieve Goals: Fair Progress towards PT goals: Progressing toward goals    Frequency    Min 2X/week      PT Plan      Co-evaluation              AM-PAC PT 6 Clicks Mobility   Outcome Measure  Help needed turning from your back to your side while in a flat bed without using bedrails?: A Little Help needed moving from lying on your back to sitting on the side of a flat bed without using bedrails?: A Lot Help needed moving to and from a bed to a chair (including a wheelchair)?: A Little Help needed standing up from a chair using your arms (e.g., wheelchair or bedside chair)?: A Little Help needed to walk in hospital room?: A Little Help needed climbing 3-5 steps with a railing? :  Total 6 Click Score: 15    End of Session Equipment Utilized During Treatment: Gait belt Activity Tolerance: Patient tolerated treatment well Patient left: in chair;with call bell/phone within reach;with nursing/sitter in room (nurse aware of pt not having chair alarm (none available on unit), paid personal sitter in room to supervise) Nurse Communication: Mobility status PT Visit Diagnosis: Other abnormalities of gait and mobility (R26.89);Muscle weakness (generalized) (M62.81);History of falling (Z91.81);Unsteadiness on feet (R26.81);Difficulty in walking, not elsewhere classified (R26.2);Other (comment);Pain Pain - Right/Left: Left Pain - part of body:  (ribs)     Time: 9082-9049 PT Time Calculation (min) (ACUTE ONLY): 33 min  Charges:    $Therapeutic Activity: 23-37 mins PT General Charges $$ ACUTE PT VISIT: 1 Visit                     Richerd Pinal, PT, DPT 05/05/24, 10:01 AM    Richerd CHRISTELLA Pinal 05/05/2024, 9:59 AM

## 2024-05-05 NOTE — Plan of Care (Signed)
  Problem: Education: Goal: Knowledge of General Education information will improve Description: Including pain rating scale, medication(s)/side effects and non-pharmacologic comfort measures Outcome: Not Progressing   Problem: Health Behavior/Discharge Planning: Goal: Ability to manage health-related needs will improve Outcome: Not Progressing   Problem: Clinical Measurements: Goal: Ability to maintain clinical measurements within normal limits will improve Outcome: Not Progressing Goal: Will remain free from infection Outcome: Not Progressing Goal: Diagnostic test results will improve Outcome: Not Progressing Goal: Respiratory complications will improve Outcome: Not Progressing Goal: Cardiovascular complication will be avoided Outcome: Not Progressing   Problem: Activity: Goal: Risk for activity intolerance will decrease Outcome: Not Progressing   Problem: Nutrition: Goal: Adequate nutrition will be maintained Outcome: Not Progressing   Problem: Coping: Goal: Level of anxiety will decrease Outcome: Not Progressing   Problem: Elimination: Goal: Will not experience complications related to bowel motility Outcome: Not Progressing Goal: Will not experience complications related to urinary retention Outcome: Not Progressing   Problem: Pain Managment: Goal: General experience of comfort will improve and/or be controlled Outcome: Not Progressing   Problem: Safety: Goal: Ability to remain free from injury will improve Outcome: Not Progressing   Problem: Skin Integrity: Goal: Risk for impaired skin integrity will decrease Outcome: Not Progressing   Problem: Activity: Goal: Ability to tolerate increased activity will improve Outcome: Not Progressing   Problem: Clinical Measurements: Goal: Ability to maintain a body temperature in the normal range will improve Outcome: Not Progressing   Problem: Respiratory: Goal: Ability to maintain adequate ventilation will  improve Outcome: Not Progressing Goal: Ability to maintain a clear airway will improve Outcome: Not Progressing   Problem: Education: Goal: Ability to describe self-care measures that may prevent or decrease complications (Diabetes Survival Skills Education) will improve Outcome: Not Progressing Goal: Individualized Educational Video(s) Outcome: Not Progressing   Problem: Coping: Goal: Ability to adjust to condition or change in health will improve Outcome: Not Progressing   Problem: Fluid Volume: Goal: Ability to maintain a balanced intake and output will improve Outcome: Not Progressing   Problem: Health Behavior/Discharge Planning: Goal: Ability to identify and utilize available resources and services will improve Outcome: Not Progressing Goal: Ability to manage health-related needs will improve Outcome: Not Progressing   Problem: Metabolic: Goal: Ability to maintain appropriate glucose levels will improve Outcome: Not Progressing   Problem: Nutritional: Goal: Maintenance of adequate nutrition will improve Outcome: Not Progressing Goal: Progress toward achieving an optimal weight will improve Outcome: Not Progressing   Problem: Skin Integrity: Goal: Risk for impaired skin integrity will decrease Outcome: Not Progressing   Problem: Tissue Perfusion: Goal: Adequacy of tissue perfusion will improve Outcome: Not Progressing

## 2024-05-05 NOTE — Progress Notes (Signed)
   05/05/24 1425  Mobility  Activity Ambulated with assistance in hallway;Ambulated with assistance to bathroom  Level of Assistance Minimal assist, patient does 75% or more  Assistive Device Front wheel walker  Distance Ambulated (ft) 200 ft  Activity Response Tolerated well  Mobility Referral Yes  Mobility visit 1 Mobility  Mobility Specialist Start Time (ACUTE ONLY) 1425  Mobility Specialist Stop Time (ACUTE ONLY) 1504  Mobility Specialist Time Calculation (min) (ACUTE ONLY) 39 min   Mobility Specialist: Progress Note  Post-Mobility:    HR  63, SpO2 92% RA  Pt agreeable to mobility session - received in bed. C/o dizziness - RN aware. Returned to bed with all needs met - call bell within reach. Bed alarm on. Caretaker present.   ______________________________________________________________________________  Pre-Mobility:      HR 73, SpO2 97% RA Post-Mobility:    HR  73, SpO2 90% RA  Pt agreeable to mobility session - received in sittign EOB. C/o bottom soreness and feeling winded , VSS.  Returned to chair with all needs met - call bell within reach. Chair alarm on. Caretaker present.   Virgle Boards, BS Mobility Specialist Please contact via SecureChat or  Rehab office at [phone].

## 2024-05-05 NOTE — TOC Progression Note (Addendum)
 Transition of Care [facility]) - Progression Note    Patient Details  Name: Kiara Blair MRN: [id number] Date of Birth: [birth year]  [facility]) CM/SW Contact  Inocente GORMAN Kindle, LCSW Phone Number: 05/05/2024, 2:15 PM  Clinical Narrative:    2:15pm-CSW left voicemail for Maryjo Lesches at [facility] to discuss potential discharge tomorrow.   3:35 PM-CSW contacted facility again and was transferred to [facility] again.   Expected Discharge Plan: Assisted Living Barriers to Discharge: Continued Medical Work up               Expected Discharge Plan and Services In-house Referral: Clinical Social Work   Post Acute Care Choice: Hospice Living arrangements for the past 2 months: Assisted Living Facility                                       Social Drivers of Health (SDOH) Interventions SDOH Screenings   Food Insecurity: No Food Insecurity (05/01/2024)  Housing: Low Risk  (05/01/2024)  Transportation Needs: No Transportation Needs (05/01/2024)  Utilities: Not At Risk (05/01/2024)  Financial Resource Strain: Low Risk  (01/02/2024)   Received from [facility]  Physical Activity: Sufficiently Active (06/15/2020)   Received from [facility]  Social Connections: Socially Isolated (05/01/2024)  Stress: Stress Concern Present (06/15/2020)   Received from [facility]  Tobacco Use: Low Risk  (04/30/2024)    Readmission Risk Interventions    04/24/2024   10:30 AM  Readmission Risk Prevention Plan  Transportation Screening Complete  PCP or Specialist Appt within 3-5 Days Complete  HRI or Home Care Consult Complete  Social Work Consult for Recovery Care Planning/Counseling Complete  Palliative Care Screening Not Applicable  Medication Review Oceanographer) Complete

## 2024-05-05 NOTE — Progress Notes (Signed)
 PROGRESS NOTE        PATIENT DETAILS Name: Kiara Blair Age: 89 y.o. Sex: female Date of Birth: 1930/02/19 Admit Date: 04/30/2024 Admitting Physician Mario Tobie GAILS, MD ERE:Bnlwh, Tinnie BRAVO, PA  Brief Summary: Patient is a 89 y.o.  female who was hospitalized at [facility] (7/15-7/19) for fall/left seventh rib fracture/trace left pneumothorax-presented to the hospital on 7/23 with multiple falls-found to have numerous new rib fractures, PNA with pleural effusions.  While in the ED-patient developed aphasia-requiring neurology evaluation.  Significant events: 7/23>> admit to [facility].  Significant studies: 7/23>> CT head: No acute intracranial abnormality. 7/23>> CT C-spine: No acute fractures. 7/23>> CT chest/abdomen/pelvis: New left 5th, 6th, 7th and 10th rib fracture.  Moderate left pleural effusion with loculation.  Left lower lobe consolidation. 7/23>> x-ray left knee: No fracture. 7/23>> x-ray left elbow: No fracture. 7/23>> repeat CT head code stroke: No ICH. 7/24>> MRI brain: No acute CVA  Significant microbiology data: 7/24>> pleural fluid culture: Negative  Procedures: 7/24>> thoracocentesis  Consults: Pulmonology Neurology  Subjective:  Patient in bed, appears comfortable, denies any headache, no fever, no chest pain or pressure, no shortness of breath , no abdominal pain. No focal weakness.  Objective: Vitals: Blood pressure (!) 154/83, pulse 70, temperature (!) 97.5 F (36.4 C), temperature source Oral, resp. rate 20, weight 59.4 kg, SpO2 93%.   Exam:  Awake Alert, No new F.N deficits, Normal affect Bruise present on admission on the left forehead Supple Neck, No JVD,   Symmetrical Chest wall movement, Good air movement bilaterally, CTAB RRR,No Gallops, Rubs or new Murmurs,  +ve B.Sounds, Abd Soft, No tenderness,   No Cyanosis, Clubbing or edema   Assessment/Plan:  Recurrent fall with new left left 5th, 6th, 7th and 10th rib fracture Pain  seems to be appropriately controlled with scheduled Tylenol  and Lidoderm  patch Minimize narcotics Mobilize Incentive spirometry/flutter valve PT/OT following-although SNF being recommended-family prefers patient go back to her ALF with aide and hospice follow-up.  PNA  Likely aspiration pneumonia Appreciate SLP eval-continue regular diet with known aspiration precautions Continue Unasyn   Left-sided pleural effusion-likely hemothorax Likely hemothorax in the setting of falls S/p thoracocentesis Agree with PCCM-Best served by not placing chest tube drainage-as patient with delirium and will likely pull it out. Repeat x-rays of the next several days   Transient aphasia Occurred in the ED Suspicion that this may be related to narcotic administration for pain Aphasia has resolved MRI brain without any acute CVA Doubt any further workup required  Hypokalemia and hypomagnesemia.  Replace and recheck.    Delirium Suspect secondary to PNA/narcotics-likely superimposed on chronic cognitive dysfunction Much better today after sleeping the entire night Continue Seroquel /melatonin. Delirium precautions.  Essential tremor Propranolol   GAD Lexapro   Hyponatremia, hypokalemia, hypomagnesemia Mild Hydrate for hyponatremia, replace potassium and magnesium .  HTN BP stable Amlodipine /benazepril . Follow/optimize.  Prior history of TIA Aspirin /statin    6 mm left upper lobe pulmonary nodule Incidental finding on CT chest Outpatient follow-up  Advance directive/palliative care Second hospitalization in a week Frail-at risk for further decompensation-see documentation above DNR in place Palliative care following-goals of care for general medical treatment.  DM-2 (A1c 9.8 on 7/23) CBGs remain on the higher side-diet is erratic but overall stable Increase Semglee  to 12 units Continue SSI.  Follow/optimize blood not a candidate for aggressive glycemic control given risk of  hypoglycemia.  Recent Labs    05/04/24 1221 05/04/24 1607 05/05/24 0830  GLUCAP 268* 255* 257*    Code status:   Code Status: Limited: Do not attempt resuscitation (DNR) -DNR-LIMITED -Do Not Intubate/DNI    DVT Prophylaxis: heparin  injection 5,000 Units Start: 04/30/24 1400   Family Communication: Son-[name]-[phone] updated 05/05/2024   Disposition Plan: Status is: Inpatient Remains inpatient appropriate because: Severity of illness   Planned Discharge Destination:Skilled nursing facility   Diet: Diet Order             Diet regular Room service appropriate? Yes; Fluid consistency: Thin  Diet effective now                     MEDICATIONS: Scheduled Meds:  acetaminophen   1,000 mg Oral Q8H   amLODipine   5 mg Oral Daily   And   benazepril   20 mg Oral Daily   aspirin  EC  81 mg Oral Daily   atorvastatin   40 mg Oral QHS   escitalopram   5 mg Oral QHS   feeding supplement  237 mL Oral BID BM   heparin   5,000 Units Subcutaneous Q8H   insulin  aspart  0-5 Units Subcutaneous QHS   insulin  aspart  0-9 Units Subcutaneous TID WC   insulin  glargine-yfgn  12 Units Subcutaneous Daily   lidocaine   1 patch Transdermal Q24H   melatonin  10 mg Oral QHS   potassium chloride   40 mEq Oral BID   propranolol   10 mg Oral BID   QUEtiapine   25 mg Oral QHS   sodium chloride  flush  3 mL Intravenous Once   Continuous Infusions:  ampicillin -sulbactam (UNASYN ) IV 3 g (05/05/24 0533)   PRN Meds:.albuterol , docusate sodium , guaiFENesin , hydrALAZINE    I have personally reviewed following labs and imaging studies  LABORATORY DATA: CBC: Recent Labs  Lab 04/30/24 0625 04/30/24 0631 04/30/24 1024 04/30/24 1027 05/02/24 0554 05/04/24 0606  WBC 9.9  --  9.8  --  13.1* 10.9*  NEUTROABS  --   --  6.6  --   --  7.5  HGB 12.0 13.6 12.2 13.3 12.4 10.4*  HCT 35.7* 40.0 37.0 39.0 37.5 32.3*  MCV 77.4*  --  77.6*  --  77.3* 77.3*  PLT 406*  --  344  --  417* 389    Basic  Metabolic Panel: Recent Labs  Lab 04/30/24 0625 04/30/24 0631 04/30/24 1024 04/30/24 1027 05/02/24 0554 05/04/24 0606  NA 129* 129* 129* 130* 135 136  K 4.3 4.4 4.3 4.3 3.8 3.2*  CL 93* 95* 95* 97* 100 102  CO2 24  --  23  --  22 26  GLUCOSE 322* 328* 295* 299* 353* 200*  BUN 18 21 16 18 19 23   CREATININE 0.81 0.70 0.68 0.70 0.81 0.65  CALCIUM  9.5  --  9.2  --  9.6 8.7*  MG 1.6*  --   --   --   --  1.4*  PHOS  --   --   --   --   --  3.2    GFR: Estimated Creatinine Clearance: 37.1 mL/min (by C-G formula based on SCr of 0.65 mg/dL).  Liver Function Tests: Recent Labs  Lab 04/30/24 0625 04/30/24 1024  AST 26 25  ALT 23 20  ALKPHOS 148* 146*  BILITOT 0.9 0.9  PROT 6.0* 5.9*  ALBUMIN 2.8* 2.8*   No results for input(s): LIPASE, AMYLASE in the last 168 hours. No results for input(s): AMMONIA in the last 168  hours.  Coagulation Profile: Recent Labs  Lab 04/30/24 0625 04/30/24 1024  INR 1.0 1.0    Urine analysis:    Component Value Date/Time   COLORURINE STRAW (A) 04/30/2024 1800   APPEARANCEUR CLEAR 04/30/2024 1800   LABSPEC 1.006 04/30/2024 1800   PHURINE 7.0 04/30/2024 1800   GLUCOSEU >=500 (A) 04/30/2024 1800   HGBUR NEGATIVE 04/30/2024 1800   BILIRUBINUR NEGATIVE 04/30/2024 1800   KETONESUR 5 (A) 04/30/2024 1800   PROTEINUR NEGATIVE 04/30/2024 1800   NITRITE NEGATIVE 04/30/2024 1800   LEUKOCYTESUR NEGATIVE 04/30/2024 1800    Sepsis Labs: Lactic Acid, Venous    Component Value Date/Time   LATICACIDVEN 0.77 06/09/2017 1350    MICROBIOLOGY: Recent Results (from the past 240 hours)  Body fluid culture w Gram Stain     Status: None   Collection Time: 05/01/24  2:18 PM   Specimen: Pleural Fluid  Result Value Ref Range Status   Specimen Description FLUID PLEURAL  Final   Special Requests NONE  Final   Gram Stain NO WBC SEEN NO ORGANISMS SEEN   Final   Culture   Final    NO GROWTH 3 DAYS Performed at [facility], [address], [place], [place] [address]    Report Status 05/04/2024 FINAL  Final    RADIOLOGY STUDIES/RESULTS: DG Chest Port 1 View Result Date: 05/04/2024 CLINICAL DATA:  [id number]. Shortness of breath, multiple left rib fractures. [id number].  Hemothorax. EXAM: PORTABLE CHEST 1 VIEW COMPARISON:  Portable chest 05/02/2024 at 1:56 p.m. FINDINGS: 6:21 a.m. Multiple displaced left rib cage fractures are again noted. Today there is increased, near circumferential left pleural fluid or blood thickening the pleural margin up to 2 cm all the way to the apex. There is increased patchy consolidation in the left lower lung field. There is no measurable pneumothorax. There is increased faint haziness in the right upper lobe mid field which could be developing pneumonia. Remainder of the lungs appear clear. The cardiac size is stable. The aorta is heavily calcified. No new skeletal abnormality is seen. In all other respects, no further changes. IMPRESSION: 1. Increased, near circumferential left pleural fluid or blood thickening the pleural reflection up to 2 cm all the way to the apex. Consider CT with IV contrast to assess for active bleeding source. 2. Increased patchy consolidation in the left lower lung field. 3. Increased faint haziness in the right upper lobe mid field which could be developing pneumonia. 4. Multiple recent displaced left rib cage fractures. 5. Aortic atherosclerosis. 6. These results will be called to the ordering clinician or representative by the Radiologist Assistant, and communication documented in the PACS or [employer]. Electronically Signed   By: [name] Quam M.D.   On: 05/04/2024 07:05     LOS: 5 days   Lavada Stank, MD  [facility]    To contact the attending provider between 7A-7P or the covering provider during after hours 7P-7A, please log into the web site [email or web address] and access using universal [place] password for that web site. If you do not have the password, please  call the hospital operator.  05/05/2024, 8:56 AM

## 2024-05-06 DIAGNOSIS — W19XXXA Unspecified fall, initial encounter: Secondary | ICD-10-CM | POA: Diagnosis not present

## 2024-05-06 DIAGNOSIS — Y92009 Unspecified place in unspecified non-institutional (private) residence as the place of occurrence of the external cause: Secondary | ICD-10-CM | POA: Diagnosis not present

## 2024-05-06 DIAGNOSIS — S0012XA Contusion of left eyelid and periocular area, initial encounter: Secondary | ICD-10-CM | POA: Diagnosis not present

## 2024-05-06 LAB — MAGNESIUM: Magnesium: 1.5 mg/dL — ABNORMAL LOW (ref 1.7–2.4)

## 2024-05-06 LAB — BASIC METABOLIC PANEL WITH GFR
Anion gap: 11 (ref 5–15)
BUN: 13 mg/dL (ref 8–23)
CO2: 23 mmol/L (ref 22–32)
Calcium: 9 mg/dL (ref 8.9–10.3)
Chloride: 98 mmol/L (ref 98–111)
Creatinine, Ser: 0.61 mg/dL (ref 0.44–1.00)
GFR, Estimated: >60 mL/min (ref >60)
Glucose, Bld: 235 mg/dL — ABNORMAL HIGH (ref 70–99)
Potassium: 3.6 mmol/L (ref 3.5–5.1)
Sodium: 132 mmol/L — ABNORMAL LOW (ref 135–145)

## 2024-05-06 LAB — GLUCOSE, CAPILLARY
Glucose-Capillary: 245 mg/dL — ABNORMAL HIGH (ref 70–99)
Glucose-Capillary: 254 mg/dL — ABNORMAL HIGH (ref 70–99)
Glucose-Capillary: 189 mg/dL — ABNORMAL HIGH (ref 70–99)

## 2024-05-06 LAB — PHOSPHORUS: Phosphorus: 2.4 mg/dL — ABNORMAL LOW (ref 2.5–4.6)

## 2024-05-06 MED ORDER — POTASSIUM & SODIUM PHOSPHATES 280-160-250 MG PO PACK
2.0000 | PACK | Freq: Once | ORAL | Status: AC
  Administered 2024-05-06: 2 via ORAL
  Filled 2024-05-06: qty 2

## 2024-05-06 MED ORDER — AMOXICILLIN-POT CLAVULANATE 875-125 MG PO TABS
1.0000 | ORAL_TABLET | Freq: Two times a day (BID) | ORAL | 0 refills | Status: AC
Start: 2024-05-06 — End: 2024-05-09

## 2024-05-06 MED ORDER — AMOXICILLIN-POT CLAVULANATE 875-125 MG PO TABS: 1.0000 | ORAL_TABLET | Freq: Two times a day (BID) | ORAL | Status: DC

## 2024-05-06 MED ORDER — INSULIN ASPART 100 UNIT/ML FLEXPEN: PEN_INJECTOR | SUBCUTANEOUS | Status: AC

## 2024-05-06 MED ORDER — QUETIAPINE FUMARATE 25 MG PO TABS: 25.0000 mg | ORAL_TABLET | Freq: Every day | ORAL | Status: DC

## 2024-05-06 MED ORDER — QUETIAPINE FUMARATE 25 MG PO TABS: 25.0000 mg | ORAL_TABLET | Freq: Every day | ORAL | 0 refills | Status: AC

## 2024-05-06 MED ORDER — AMLODIPINE BESY-BENAZEPRIL HCL 5-20 MG PO CAPS: 1.0000 | ORAL_CAPSULE | Freq: Every day | ORAL | Status: AC

## 2024-05-06 MED ORDER — MAGNESIUM SULFATE 4 GM/100ML IV SOLN
4.0000 g | Freq: Once | INTRAVENOUS | Status: AC
  Administered 2024-05-06: 4 g via INTRAVENOUS
  Filled 2024-05-06: qty 100

## 2024-05-06 NOTE — Progress Notes (Signed)
 Speech Language Pathology Treatment: Dysphagia  Patient Details Name: Kiara Blair MRN: [id number] DOB: 07/18/1930 Today's Date: 05/06/2024 Time: 8852-8794 SLP Time Calculation (min) (ACUTE ONLY): 18 min  Assessment / Plan / Recommendation Clinical Impression  Patient seen by SLP for skilled treatment focused on dysphagia goals. Her hired caregiver was in the room and reported that patient has been eating well when she is fed. Patient told SLP she was feeling restless and wanted to walk. SLP and caregiver able to redirect patient and her lunch meal tray arrived. Caregiver fed patient, who would accept food but frequently telling caregiver that she didn't want anything to eat. Patient worried, asking if her parents knew she was here at the hospital. She was able to be redirected from this topic but would return to it. SLP recommending continue on regular solids, thin liquids and no f/u warranted.    HPI HPI: 89 yo female presenting 7/23 with multiple falls. Work up revealed numerous new rib fxs and PNA with pleural effusions s/p thoracentesis 7/24. Pt had transient aphasia in the ED but MRI was negative for acute findings. Previous swallow eval in February 2024 was [facility]. PMH includes: recent hospitalization for fall with rib fx/trace PTX, HTN, DM, CVA, GERD, anxiety, HLD, tremor      SLP Plan  Discharge SLP treatment due to (comment);All goals met          Recommendations  Diet recommendations: Regular;Thin liquid Liquids provided via: Cup;Straw Medication Administration: Whole meds with puree Supervision: Full supervision/cueing for compensatory strategies;Trained caregiver to feed patient Compensations: Slow rate;Small sips/bites;Minimize environmental distractions Postural Changes and/or Swallow Maneuvers: Seated upright 90 degrees;Upright 30-60 min after meal                  Oral care BID   Frequent or constant Supervision/Assistance Dysphagia, unspecified (R13.10)      Discharge SLP treatment due to (comment);All goals met     Norleen IVAR Blase, MA, CCC-SLP Speech Therapy

## 2024-05-06 NOTE — TOC Progression Note (Signed)
 Transition of Care [facility]) - Progression Note    Patient Details  Name: Kiara Blair MRN: [id number] Date of Birth: 07-31-1930  [facility]) CM/SW Contact  Inocente GORMAN Kindle, LCSW Phone Number: 05/06/2024, 10:01 AM  Clinical Narrative:    CSW spoke with Therisa at [facility] and confirmed patient will go to their memory care side. CSW will fax DC Summary and Fl2 to [phone]. They stated patient only had home health services and not hospice but they contract with [facility].   CSW spoke with patient's sons, Kiara Blair and Kiara Blair. They reported they had not yet initiated hospice but are open to referral. CSW answered questions about services and sent referral to [facility] to speak with sons and answer further questions.   Expected Discharge Plan: Memory Care Barriers to Discharge: Barriers Resolved               Expected Discharge Plan and Services In-house Referral: Clinical Social Work   Post Acute Care Choice: Hospice Living arrangements for the past 2 months: Assisted Living Facility Expected Discharge Date: 05/06/24                                     Social Drivers of Health (SDOH) Interventions SDOH Screenings   Food Insecurity: No Food Insecurity (05/01/2024)  Housing: Low Risk  (05/01/2024)  Transportation Needs: No Transportation Needs (05/01/2024)  Utilities: Not At Risk (05/01/2024)  Financial Resource Strain: Low Risk  (01/02/2024)   Received from [facility]  Physical Activity: Sufficiently Active (06/15/2020)   Received from [facility]  Social Connections: Socially Isolated (05/01/2024)  Stress: Stress Concern Present (06/15/2020)   Received from [facility]  Tobacco Use: Low Risk  (04/30/2024)    Readmission Risk Interventions    05/06/2024   10:00 AM 04/24/2024   10:30 AM  Readmission Risk Prevention Plan  Transportation Screening Complete Complete  PCP or Specialist Appt within 3-5 Days  Complete  HRI or Home Care Consult   Complete  Social Work Consult for Recovery Care Planning/Counseling  Complete  Palliative Care Screening  Not Applicable  Medication Review Oceanographer) Complete Complete  PCP or Specialist appointment within 3-5 days of discharge Complete   HRI or Home Care Consult Complete   SW Recovery Care/Counseling Consult Complete   Palliative Care Screening Complete   Skilled Nursing Facility Not Applicable

## 2024-05-06 NOTE — Progress Notes (Signed)
 Nursing Discharge Note  Name: Kiara Blair MRN: [id number] DOB: 01-31-1930  Admit Date: 04/30/2024 Discharge Date: 05/06/2024  Kiara Blair to be discharged to a Skilled Nursing Facility per MD order.  AVS completed, placed in discharge packet for facility review. Discharge packet compiled for facility. Non-emergency ambulance transport arranged.  Report called to Kiara Blair, Resident Care Director  at [facility]. Paper prescription for seroquel  and augmentin  sent with PT[facility] in Discharge Packet.  FL-2 requested by Kiara Blair, Resident Care Director. FL-2 printed and sent to facility with Discharge Packet.   Son, Kiara Blair called to notify him of discharge today and pending PTAR pick-up.     Discharge Instructions      Follow with Primary MD Kiara Tinnie BRAVO, PA in 7 days   Get CBC, CMP, Magnesium , phosphorus, 2 view Chest X ray -  checked next visit with your primary MD or SNF MD   Activity: As tolerated with Full fall precautions use walker/cane & assistance as needed  Disposition memory care unit  Diet: Heart Healthy low carbohydrate diet, CBGs q. [facility]  Special Instructions: If you have smoked or chewed Tobacco  in the last 2 yrs please stop smoking, stop any regular Alcohol   and or any Recreational drug use.  On your next visit with your primary care physician please Get Medicines reviewed and adjusted.  Please request your Prim.MD to go over all Hospital Tests and Procedure/Radiological results at the follow up, please get all Hospital records sent to your Prim MD by signing hospital release before you go home.  If you experience worsening of your admission symptoms, develop shortness of breath, life threatening emergency, suicidal or homicidal thoughts you must seek medical attention immediately by calling 911 or calling your MD immediately  if symptoms less severe.  You Must read complete instructions/literature along with all the possible adverse reactions/side effects for  all the Medicines you take and that have been prescribed to you. Take any new Medicines after you have completely understood and accpet all the possible adverse reactions/side effects.   Do not drive when taking Pain medications.  Do not take more than prescribed Pain, Sleep and Anxiety Medications  Wear Seat belts while driving.         Allergies as of 05/06/2024       Reactions   Doxycycline Hyclate Nausea And Vomiting   Levofloxacin Nausea And Vomiting   Meloxicam Nausea And Vomiting   Naproxen-esomeprazole Mg Nausea And Vomiting   Nitrofurantoin Nausea And Vomiting   Esomeprazole Other (See Comments)   No reaction listed on MAR   Naproxen Other (See Comments)   No reaction listed on MAR   Other    Pt states she is allergic to meds but does not know names   Pneumococcal Polysaccharide Vaccine Itching        Medication List     STOP taking these medications    amLODipine  5 MG tablet Commonly known as: NORVASC    hydrOXYzine  25 MG capsule Commonly known as: VISTARIL    Kazano 12.02-999 MG Tabs Generic drug: Alogliptin-metFORMIN  HCl   methocarbamol  500 MG tablet Commonly known as: ROBAXIN    oxyCODONE  5 MG immediate release tablet Commonly known as: Oxy IR/ROXICODONE    pregabalin  100 MG capsule Commonly known as: Lyrica        TAKE these medications    acetaminophen  325 MG tablet Commonly known as: Tylenol  Take 2 tablets (650 mg total) by mouth every 6 (six) hours as needed. What changed: reasons to  take this   amLODipine -benazepril  5-20 MG capsule Commonly known as: LOTREL Take 1 capsule by mouth at bedtime.   amoxicillin -clavulanate 875-125 MG tablet Commonly known as: AUGMENTIN  Take 1 tablet by mouth 2 (two) times daily for 3 days.   aspirin  EC 81 MG tablet Take 1 tablet (81 mg total) by mouth daily. Swallow whole.   atorvastatin  40 MG tablet Commonly known as: LIPITOR Take 1 tablet (40 mg total) by mouth daily. What changed: when to take  this   Daily Vite Tabs Take 1 tablet by mouth daily.   escitalopram  5 MG tablet Commonly known as: LEXAPRO  Take 5 mg by mouth at bedtime.   insulin  aspart 100 UNIT/ML FlexPen Commonly known as: NOVOLOG  Before each meal 3 times a day, 140-199 - 2 units, 200-250 - 4 units, 251-299 - 6 units,  300-349 - 8 units,  350 or above 10 units. What changed:  how much to take how to take this when to take this additional instructions   insulin  glargine-yfgn 100 UNIT/ML Pen Commonly known as: SEMGLEE  Inject 6 Units into the skin at bedtime.   lidocaine  5 % Commonly known as: Lidoderm  Place 1 patch onto the skin daily. Remove & Discard patch within 12 hours or as directed by MD   meclizine  12.5 MG tablet Commonly known as: ANTIVERT  Take 12.5 mg by mouth 3 (three) times daily as needed for dizziness or nausea.   Melatonin 3 MG Tbdp Take 3 mg by mouth at bedtime.   mineral oil-hydrophilic petrolatum ointment Apply 1 Application topically 2 (two) times daily.   propranolol  10 MG tablet Commonly known as: INDERAL  Take 10 mg by mouth See admin instructions. Give 1 tablet (10mg ) by mouth twice daily for essential tremor. Hold for HR < 55 or BP < 90/60.   QUEtiapine  25 MG tablet Commonly known as: SEROQUEL  Take 1 tablet (25 mg total) by mouth at bedtime.   Senna-Tabs 8.6 MG tablet Generic drug: senna Take 1 tablet by mouth every other day.   THERATEARS OP Apply 1 drop to eye 3 (three) times daily.   Vitamin D  (Ergocalciferol ) 1.25 MG (50000 UNIT) Caps capsule Commonly known as: DRISDOL Take 50,000 Units by mouth once a week.   Vitamin D3 50 MCG (2000 UT) Tabs Take 1 tablet by mouth daily.   Voltaren  1 % Gel Generic drug: diclofenac  Sodium Apply 2 g topically 2 (two) times daily.         Discharge Instructions     Discharge instructions   Complete by: As directed    Follow with Primary MD Kiara Tinnie BRAVO, PA in 7 days   Get CBC, CMP, Magnesium , phosphorus, 2 view  Chest X ray -  checked next visit with your primary MD or SNF MD   Activity: As tolerated with Full fall precautions use walker/cane & assistance as needed  Disposition memory care unit  Diet: Heart Healthy low carbohydrate diet, CBGs q. [facility]  Special Instructions: If you have smoked or chewed Tobacco  in the last 2 yrs please stop smoking, stop any regular Alcohol   and or any Recreational drug use.  On your next visit with your primary care physician please Get Medicines reviewed and adjusted.  Please request your Prim.MD to go over all Hospital Tests and Procedure/Radiological results at the follow up, please get all Hospital records sent to your Prim MD by signing hospital release before you go home.  If you experience worsening of your admission symptoms, develop shortness of  breath, life threatening emergency, suicidal or homicidal thoughts you must seek medical attention immediately by calling 911 or calling your MD immediately  if symptoms less severe.  You Must read complete instructions/literature along with all the possible adverse reactions/side effects for all the Medicines you take and that have been prescribed to you. Take any new Medicines after you have completely understood and accpet all the possible adverse reactions/side effects.   Do not drive when taking Pain medications.  Do not take more than prescribed Pain, Sleep and Anxiety Medications  Wear Seat belts while driving.   Increase activity slowly   Complete by: As directed    No wound care   Complete by: As directed          [facility] to provide transportation to facility for patient. Non-emergency ambulance transport at bedside. Handoff completed with PTAR staff/EMTs.   Patient discharged from hospital unit via stretcher. Stable at time of discharge.

## 2024-05-06 NOTE — Progress Notes (Signed)
 [facility] Liaison Note  Received request from [place] Transitions of Care Manager, for hospice services at home after discharge. Spoke with patient's so [name] (unable to reach [facility]) to initiate education related to hospice philosophy, services, and team approach to care. [name] verbalized understanding of information given. Per discussion, the plan is for discharge home by PTAR/EMS or private vehicle 05/06/24 DME needs discussed. Patient has walker and wheelchair at [employer]. The address has been verified and is correct in the chart. Please send signed and completed DNR home with patient.  Please provide prescriptions at discharge as needed to ensure ongoing symptom management.  [facility] information and contact numbers given to [place]. Above information shared with [place], Transitions of Care Manager. Please call with any questions or concerns.  Thank you for the opportunity to participate in this patient's care.   Amy Darien BSN, RN [facility] Liaison

## 2024-05-06 NOTE — NC FL2 (Signed)
 [place]  MEDICAID [id number] LEVEL OF CARE FORM     IDENTIFICATION  Patient Name: Kiara Blair Birthdate: December 11, 1929 Sex: female Admission Date (Current Location): 04/30/2024  [facility] and [place] Number:  Producer, television/film/video and Address:  [facility] [place], [address], [place], [place] [address]      Provider Number: [id number]  Attending Physician Name and Address:  Dennise Lavada POUR, MD  Relative Name and Phone Number:       Current Level of Care: Hospital Recommended Level of Care: Memory Care Prior Approval Number:    Date Approved/Denied:   PASRR Number: [id number]  Discharge Plan: Other (Comment) (memory care)    Current Diagnoses: Patient Active Problem List   Diagnosis Date Noted   Fall at home, initial encounter 04/30/2024   Tendinopathy of left rotator cuff 04/23/2024   Altered mental status 02/24/2024   Lower urinary tract infectious disease 02/24/2024   Fall 02/24/2024   Rib fracture 02/24/2024   Chronic health problem 02/24/2024   Abdominal pain 02/24/2024   Acute metabolic encephalopathy 11/29/2022   Stroke-like symptoms 11/27/2022   Generalized anxiety disorder 03/22/2022   History of CVA (cerebrovascular accident) 01/04/2021   Essential hypertension 01/03/2021   T2DM (type 2 diabetes mellitus) (HCC) 01/03/2021   Paresthesia 05/01/2020   Nuclear sclerotic cataract of left eye 03/29/2018   Sensorineural hearing loss (SNHL) of both ears 03/13/2017   Gastroesophageal reflux disease 06/26/2016   Diabetic polyneuropathy associated with type 2 diabetes mellitus (HCC) 01/10/2016   Essential tremor 10/22/2015   Hyperlipidemia LDL goal <100 10/22/2015    Orientation RESPIRATION BLADDER Height & Weight     Self  Normal Incontinent Weight: 130 lb 15.3 oz (59.4 kg) Height:     BEHAVIORAL SYMPTOMS/MOOD NEUROLOGICAL BOWEL NUTRITION STATUS      Continent Diet (carb modified)  AMBULATORY STATUS COMMUNICATION OF NEEDS Skin   Limited  Assist Verbally Other (Comment), PU Stage and Appropriate Care (trauma on face, leg, and both arms. Pressure injury stage 2 on sacrum)                       Personal Care Assistance Level of Assistance  Bathing, Feeding, Dressing Bathing Assistance: Limited assistance Feeding assistance: Limited assistance Dressing Assistance: Limited assistance     Functional Limitations Info  Sight, Hearing, Speech Sight Info: Impaired Hearing Info: Impaired Speech Info: Adequate    SPECIAL CARE FACTORS FREQUENCY                       Contractures Contractures Info: Not present    Additional Factors Info  Code Status, Allergies, Insulin  Sliding Scale Code Status Info: DNR Allergies Info: Doxycycline Hyclate, Levofloxacin, Meloxicam, Naproxen-esomeprazole Mg, Nitrofurantoin, Other, Pneumococcal Polysaccharide Vaccine   Insulin  Sliding Scale Info: See dc summary       Current Medications (05/06/2024):    Discharge Medications:  STOP taking these medications     amLODipine  5 MG tablet Commonly known as: NORVASC     hydrOXYzine  25 MG capsule Commonly known as: VISTARIL     Kazano 12.02-999 MG Tabs Generic drug: Alogliptin-metFORMIN  HCl    methocarbamol  500 MG tablet Commonly known as: ROBAXIN     oxyCODONE  5 MG immediate release tablet Commonly known as: Oxy IR/ROXICODONE     pregabalin  100 MG capsule Commonly known as: Lyrica            TAKE these medications     acetaminophen  325 MG tablet Commonly known as: Tylenol  Take  2 tablets (650 mg total) by mouth every 6 (six) hours as needed. What changed: reasons to take this    amLODipine -benazepril  5-20 MG capsule Commonly known as: LOTREL Take 1 capsule by mouth at bedtime.    amoxicillin -clavulanate 875-125 MG tablet Commonly known as: AUGMENTIN  Take 1 tablet by mouth 2 (two) times daily.    aspirin  EC 81 MG tablet Take 1 tablet (81 mg total) by mouth daily. Swallow whole.    atorvastatin  40 MG  tablet Commonly known as: LIPITOR Take 1 tablet (40 mg total) by mouth daily. What changed: when to take this    Daily Vite Tabs Take 1 tablet by mouth daily.    escitalopram  5 MG tablet Commonly known as: LEXAPRO  Take 5 mg by mouth at bedtime.    insulin  aspart 100 UNIT/ML FlexPen Commonly known as: NOVOLOG  Before each meal 3 times a day, 140-199 - 2 units, 200-250 - 4 units, 251-299 - 6 units,  300-349 - 8 units,  350 or above 10 units. What changed:  how much to take how to take this when to take this additional instructions    insulin  glargine-yfgn 100 UNIT/ML Pen Commonly known as: SEMGLEE  Inject 6 Units into the skin at bedtime.    lidocaine  5 % Commonly known as: Lidoderm  Place 1 patch onto the skin daily. Remove & Discard patch within 12 hours or as directed by MD    meclizine  12.5 MG tablet Commonly known as: ANTIVERT  Take 12.5 mg by mouth 3 (three) times daily as needed for dizziness or nausea.    Melatonin 3 MG Tbdp Take 3 mg by mouth at bedtime.    mineral oil-hydrophilic petrolatum ointment Apply 1 Application topically 2 (two) times daily.    propranolol  10 MG tablet Commonly known as: INDERAL  Take 10 mg by mouth See admin instructions. Give 1 tablet (10mg ) by mouth twice daily for essential tremor. Hold for HR < 55 or BP < 90/60.    QUEtiapine  25 MG tablet Commonly known as: SEROQUEL  Take 1 tablet (25 mg total) by mouth at bedtime.    Senna-Tabs 8.6 MG tablet Generic drug: senna Take 1 tablet by mouth every other day.    THERATEARS OP Apply 1 drop to eye 3 (three) times daily.    Vitamin D  (Ergocalciferol ) 1.25 MG (50000 UNIT) Caps capsule Commonly known as: DRISDOL Take 50,000 Units by mouth once a week.    Vitamin D3 50 MCG (2000 UT) Tabs Take 1 tablet by mouth daily.    Voltaren  1 % Gel Generic drug: diclofenac  Sodium Apply 2 g topically 2 (two) times daily.        Relevant Imaging Results:  Relevant Lab Results:   Additional  Information SSN:[id number], hospice to follow at facility  [facility], LCSW

## 2024-05-06 NOTE — Progress Notes (Signed)
 Physical Therapy Treatment Patient Details Name: Kiara Blair MRN: [id number] DOB: 04/02/1930 Today's Date: 05/06/2024   History of Present Illness Pt is a 89 y/o F admitted on 04/30/24 after presenting with c/o falls & AMS. Pt found to have progressive L effusion with loculation & rib fxs. Pt is s/p thoracentesis 05/01/24. PMH: dementia, DM, hypercholesterolemia, HTN, TIA, tremor    PT Comments  Pt received in recliner. Thigh high TED hose in place. Min assist sit to stand, and min assist amb 15' x 2 with RW. Pt assisted in bathroom with pericare after BM. Pt declined hallway amb due to fatigue. Pt in recliner with feet elevated at end of session.      If plan is discharge home, recommend the following: A little help with walking and/or transfers;Help with stairs or ramp for entrance;A lot of help with bathing/dressing/bathroom;Direct supervision/assist for financial management;Assistance with cooking/housework   Can travel by private vehicle     Yes  Equipment Recommendations  Rolling walker (2 wheels);BSC/3in1;Wheelchair (measurements PT);Wheelchair cushion (measurements PT)    Recommendations for Other Services       Precautions / Restrictions Precautions Precautions: Fall Recall of Precautions/Restrictions: Impaired Precaution/Restrictions Comments: hx pf multiple falls, memory impairments, hx of dizziness     Mobility  Bed Mobility               General bed mobility comments: Pt in recliner.    Transfers Overall transfer level: Needs assistance Equipment used: Rolling walker (2 wheels) Transfers: Sit to/from Stand Sit to Stand: Min assist           General transfer comment: increased time to stabilize initial standing balance    Ambulation/Gait Ambulation/Gait assistance: Min assist Gait Distance (Feet): 15 Feet (x 2) Assistive device: Rolling walker (2 wheels) Gait Pattern/deviations: Step-through pattern, Decreased stride length Gait velocity:  decreased     General Gait Details: amb to/from bathroom for BM. Declined hallway amb due to fatigue.   Stairs             Wheelchair Mobility     Tilt Bed    Modified Rankin (Stroke Patients Only)       Balance Overall balance assessment: Needs assistance, History of Falls Sitting-balance support: Feet supported, Bilateral upper extremity supported Sitting balance-Leahy Scale: Fair     Standing balance support: Bilateral upper extremity supported, During functional activity, Reliant on assistive device for balance Standing balance-Leahy Scale: Poor                              Communication Communication Communication: No apparent difficulties  Cognition Arousal: Alert Behavior During Therapy: Impulsive   PT - Cognitive impairments: Awareness, Memory, Attention, Initiation, Sequencing, Safety/Judgement, Problem solving, Orientation   Orientation impairments: Time, Situation, Place                     Following commands: Impaired Following commands impaired: Follows one step commands with increased time, Follows one step commands inconsistently    Cueing Cueing Techniques: Verbal cues, Visual cues, Tactile cues  Exercises      General Comments        Pertinent Vitals/Pain Pain Assessment Pain Assessment: Faces Faces Pain Scale: Hurts a little bit Pain Location: generalized Pain Descriptors / Indicators: Grimacing, Discomfort Pain Intervention(s): Monitored during session, Limited activity within patient's tolerance    Home Living  Prior Function            PT Goals (current goals can now be found in the care plan section) Acute Rehab PT Goals Patient Stated Goal: none stated Progress towards PT goals: Progressing toward goals    Frequency    Min 2X/week      PT Plan      Co-evaluation              AM-PAC PT 6 Clicks Mobility   Outcome Measure  Help needed turning  from your back to your side while in a flat bed without using bedrails?: A Little Help needed moving from lying on your back to sitting on the side of a flat bed without using bedrails?: A Lot Help needed moving to and from a bed to a chair (including a wheelchair)?: A Little Help needed standing up from a chair using your arms (e.g., wheelchair or bedside chair)?: A Little Help needed to walk in hospital room?: A Little Help needed climbing 3-5 steps with a railing? : Total 6 Click Score: 15    End of Session Equipment Utilized During Treatment: Gait belt Activity Tolerance: Patient limited by fatigue Patient left: in chair;with call bell/phone within reach;with chair alarm set;with family/visitor present Nurse Communication: Mobility status PT Visit Diagnosis: Other abnormalities of gait and mobility (R26.89);Muscle weakness (generalized) (M62.81);History of falling (Z91.81);Unsteadiness on feet (R26.81);Difficulty in walking, not elsewhere classified (R26.2);Other (comment);Pain     Time: 0840-0906 PT Time Calculation (min) (ACUTE ONLY): 26 min  Charges:    $Gait Training: 8-22 mins $Therapeutic Activity: 8-22 mins PT General Charges $$ ACUTE PT VISIT: 1 Visit                     Sari MATSU., PT  Office # [phone]    Erven Sari Shaker 05/06/2024, 9:22 AM

## 2024-05-06 NOTE — Plan of Care (Signed)
  Problem: Education: Goal: Knowledge of General Education information will improve Description: Including pain rating scale, medication(s)/side effects and non-pharmacologic comfort measures Outcome: Adequate for Discharge   Problem: Health Behavior/Discharge Planning: Goal: Ability to manage health-related needs will improve Outcome: Adequate for Discharge   Problem: Clinical Measurements: Goal: Ability to maintain clinical measurements within normal limits will improve Outcome: Adequate for Discharge Goal: Will remain free from infection Outcome: Adequate for Discharge Goal: Diagnostic test results will improve Outcome: Adequate for Discharge Goal: Respiratory complications will improve Outcome: Adequate for Discharge Goal: Cardiovascular complication will be avoided Outcome: Adequate for Discharge   Problem: Activity: Goal: Risk for activity intolerance will decrease Outcome: Adequate for Discharge   Problem: Nutrition: Goal: Adequate nutrition will be maintained Outcome: Adequate for Discharge   Problem: Coping: Goal: Level of anxiety will decrease Outcome: Adequate for Discharge   Problem: Elimination: Goal: Will not experience complications related to bowel motility Outcome: Adequate for Discharge Goal: Will not experience complications related to urinary retention Outcome: Adequate for Discharge   Problem: Pain Managment: Goal: General experience of comfort will improve and/or be controlled Outcome: Adequate for Discharge   Problem: Safety: Goal: Ability to remain free from injury will improve Outcome: Adequate for Discharge   Problem: Skin Integrity: Goal: Risk for impaired skin integrity will decrease Outcome: Adequate for Discharge   Problem: Activity: Goal: Ability to tolerate increased activity will improve Outcome: Adequate for Discharge   Problem: Clinical Measurements: Goal: Ability to maintain a body temperature in the normal range will  improve Outcome: Adequate for Discharge   Problem: Respiratory: Goal: Ability to maintain adequate ventilation will improve Outcome: Adequate for Discharge Goal: Ability to maintain a clear airway will improve Outcome: Adequate for Discharge   Problem: Education: Goal: Ability to describe self-care measures that may prevent or decrease complications (Diabetes Survival Skills Education) will improve Outcome: Adequate for Discharge Goal: Individualized Educational Video(s) Outcome: Adequate for Discharge   Problem: Coping: Goal: Ability to adjust to condition or change in health will improve Outcome: Adequate for Discharge   Problem: Fluid Volume: Goal: Ability to maintain a balanced intake and output will improve Outcome: Adequate for Discharge   Problem: Health Behavior/Discharge Planning: Goal: Ability to identify and utilize available resources and services will improve Outcome: Adequate for Discharge Goal: Ability to manage health-related needs will improve Outcome: Adequate for Discharge   Problem: Metabolic: Goal: Ability to maintain appropriate glucose levels will improve Outcome: Adequate for Discharge   Problem: Nutritional: Goal: Maintenance of adequate nutrition will improve Outcome: Adequate for Discharge Goal: Progress toward achieving an optimal weight will improve Outcome: Adequate for Discharge   Problem: Skin Integrity: Goal: Risk for impaired skin integrity will decrease Outcome: Adequate for Discharge   Problem: Tissue Perfusion: Goal: Adequacy of tissue perfusion will improve Outcome: Adequate for Discharge

## 2024-05-06 NOTE — Discharge Summary (Signed)
 Kiara Blair FMW:[id number] DOB: 1930/01/29 DOA: 04/30/2024  PCP: Neysa Tinnie BRAVO, PA  Admit date: 04/30/2024  Discharge date: 05/06/2024  Admitted From: Assisted living   disposition: Memory care, with hospice follow-up   Recommendations for Outpatient Follow-up:   Follow up with PCP in 1-2 weeks  PCP Please obtain BMP/CBC, 2 view CXR in 1week,  (see Discharge instructions)   PCP Please follow up on the following pending results:    Home Health: None Equipment/Devices: None Consultations: Neurology, pulmonology, palliative care Discharge Condition: Stable    CODE STATUS: DNR   Diet Recommendation: Heart Healthy Low Carb   Chief Complaint  Patient presents with   Fall     Brief history of present illness from the day of admission and additional interim summary    89 y.o.  female who was hospitalized at [facility] (7/15-7/19) for fall/left seventh rib fracture/trace left pneumothorax-presented to the hospital on 7/23 with multiple falls-found to have numerous new rib fractures, PNA with pleural effusions.  While in the ED-patient developed aphasia-requiring neurology evaluation.   Significant events: 7/23>> admit to [facility].   Significant studies: 7/23>> CT head: No acute intracranial abnormality. 7/23>> CT C-spine: No acute fractures. 7/23>> CT chest/abdomen/pelvis: New left 5th, 6th, 7th and 10th rib fracture.  Moderate left pleural effusion with loculation.  Left lower lobe consolidation. 7/23>> x-ray left knee: No fracture. 7/23>> x-ray left elbow: No fracture. 7/23>> repeat CT head code stroke: No ICH. 7/24>> MRI brain: No acute CVA   Significant microbiology data: 7/24>> pleural fluid culture: Negative   Procedures: 7/24>> thoracocentesis                                                                    Hospital Course   Recurrent fall with new left left 5th, 6th, 7th and 10th rib fracture Pain seems to be appropriately controlled continue Tylenol  as needed at SNF, Minimize narcotics as can make her delirious Mobilize Incentive spirometry/flutter valve PT/OT following-be discharged to memory care unit.  Kindly have palliative care/hospice team follow her closely   PNA  Likely aspiration pneumonia Appreciate SLP eval-continue regular diet with known aspiration precautions Finishing Augmentin  in 3 more days stop date 05/08/2024   Left-sided pleural effusion-likely hemothorax Likely hemothorax in the setting of falls S/p thoracocentesis Agree with PCCM-Best served by not placing chest tube drainage-as patient with delirium and will likely pull it out. Repeat x-rays of the next several days at the memory care unit.   Transient aphasia Occurred in the ED Suspicion that this may be related to narcotic administration for pain Aphasia has resolved MRI brain without any acute CVA Doubt any further workup required   Hypokalemia and hypomagnesemia.  Replace and recheck.     Delirium Suspect secondary to PNA/narcotics-likely superimposed on chronic cognitive  dysfunction Much better today after sleeping the entire night Continue Seroquel /melatonin. Delirium precautions.   Essential tremor Propranolol    GAD Lexapro    Hyponatremia, hypokalemia, hypomagnesemia, hypophosphatemia Mild Hydrate for hyponatremia, replaced potassium, phosphorus and magnesium .   HTN BP stable Amlodipine /benazepril . Follow/optimize.   Prior history of TIA Aspirin /statin     6 mm left upper lobe pulmonary nodule Incidental finding on CT chest Outpatient follow-up if desired by PCP.   Advance directive/palliative care Second hospitalization in a week Frail-at risk for further decompensation-see documentation above DNR in place Seen by palliative care will benefit from ongoing palliative/hospice  follow-up at the memory care unit.   DM-2 (A1c 9.8 on 7/23) On long-acting insulin  along with sliding scale, continue to monitor CBGs q. ACH S.  Adjust as needed  Discharge diagnosis     Principal Problem:   Fall at home, initial encounter    Discharge instructions    Discharge Instructions     Discharge instructions   Complete by: As directed    Follow with Primary MD Neysa Tinnie BRAVO, PA in 7 days   Get CBC, CMP, Magnesium , phosphorus, 2 view Chest X ray -  checked next visit with your primary MD or SNF MD   Activity: As tolerated with Full fall precautions use walker/cane & assistance as needed  Disposition memory care unit  Diet: Heart Healthy low carbohydrate diet, CBGs q. [facility] S  Special Instructions: If you have smoked or chewed Tobacco  in the last 2 yrs please stop smoking, stop any regular Alcohol   and or any Recreational drug use.  On your next visit with your primary care physician please Get Medicines reviewed and adjusted.  Please request your Prim.MD to go over all Hospital Tests and Procedure/Radiological results at the follow up, please get all Hospital records sent to your Prim MD by signing hospital release before you go home.  If you experience worsening of your admission symptoms, develop shortness of breath, life threatening emergency, suicidal or homicidal thoughts you must seek medical attention immediately by calling 911 or calling your MD immediately  if symptoms less severe.  You Must read complete instructions/literature along with all the possible adverse reactions/side effects for all the Medicines you take and that have been prescribed to you. Take any new Medicines after you have completely understood and accpet all the possible adverse reactions/side effects.   Do not drive when taking Pain medications.  Do not take more than prescribed Pain, Sleep and Anxiety Medications  Wear Seat belts while driving.   Increase activity slowly   Complete by:  As directed    No wound care   Complete by: As directed        Discharge Medications   Allergies as of 05/06/2024       Reactions   Doxycycline Hyclate Nausea And Vomiting   Levofloxacin Nausea And Vomiting   Meloxicam Nausea And Vomiting   Naproxen-esomeprazole Mg Nausea And Vomiting   Nitrofurantoin Nausea And Vomiting   Esomeprazole Other (See Comments)   No reaction listed on MAR   Naproxen Other (See Comments)   No reaction listed on MAR   Other    Pt states she is allergic to meds but does not know names   Pneumococcal Polysaccharide Vaccine Itching        Medication List     STOP taking these medications    amLODipine  5 MG tablet Commonly known as: NORVASC    hydrOXYzine  25 MG capsule Commonly known as: VISTARIL   Kazano 12.02-999 MG Tabs Generic drug: Alogliptin-metFORMIN  HCl   methocarbamol  500 MG tablet Commonly known as: ROBAXIN    oxyCODONE  5 MG immediate release tablet Commonly known as: Oxy IR/ROXICODONE    pregabalin  100 MG capsule Commonly known as: Lyrica        TAKE these medications    acetaminophen  325 MG tablet Commonly known as: Tylenol  Take 2 tablets (650 mg total) by mouth every 6 (six) hours as needed. What changed: reasons to take this   amLODipine -benazepril  5-20 MG capsule Commonly known as: LOTREL Take 1 capsule by mouth at bedtime.   amoxicillin -clavulanate 875-125 MG tablet Commonly known as: AUGMENTIN  Take 1 tablet by mouth 2 (two) times daily.   aspirin  EC 81 MG tablet Take 1 tablet (81 mg total) by mouth daily. Swallow whole.   atorvastatin  40 MG tablet Commonly known as: LIPITOR Take 1 tablet (40 mg total) by mouth daily. What changed: when to take this   Daily Vite Tabs Take 1 tablet by mouth daily.   escitalopram  5 MG tablet Commonly known as: LEXAPRO  Take 5 mg by mouth at bedtime.   insulin  aspart 100 UNIT/ML FlexPen Commonly known as: NOVOLOG  Before each meal 3 times a day, 140-199 - 2 units,  200-250 - 4 units, 251-299 - 6 units,  300-349 - 8 units,  350 or above 10 units. What changed:  how much to take how to take this when to take this additional instructions   insulin  glargine-yfgn 100 UNIT/ML Pen Commonly known as: SEMGLEE  Inject 6 Units into the skin at bedtime.   lidocaine  5 % Commonly known as: Lidoderm  Place 1 patch onto the skin daily. Remove & Discard patch within 12 hours or as directed by MD   meclizine  12.5 MG tablet Commonly known as: ANTIVERT  Take 12.5 mg by mouth 3 (three) times daily as needed for dizziness or nausea.   Melatonin 3 MG Tbdp Take 3 mg by mouth at bedtime.   mineral oil-hydrophilic petrolatum ointment Apply 1 Application topically 2 (two) times daily.   propranolol  10 MG tablet Commonly known as: INDERAL  Take 10 mg by mouth See admin instructions. Give 1 tablet (10mg ) by mouth twice daily for essential tremor. Hold for HR < 55 or BP < 90/60.   QUEtiapine  25 MG tablet Commonly known as: SEROQUEL  Take 1 tablet (25 mg total) by mouth at bedtime.   Senna-Tabs 8.6 MG tablet Generic drug: senna Take 1 tablet by mouth every other day.   THERATEARS OP Apply 1 drop to eye 3 (three) times daily.   Vitamin D  (Ergocalciferol ) 1.25 MG (50000 UNIT) Caps capsule Commonly known as: DRISDOL Take 50,000 Units by mouth once a week.   Vitamin D3 50 MCG (2000 UT) Tabs Take 1 tablet by mouth daily.   Voltaren  1 % Gel Generic drug: diclofenac  Sodium Apply 2 g topically 2 (two) times daily.         Follow-up Information     Care, [facility] Follow up.   Specialty: Home Health Services Why: Home health services for PT they will call you after discharge to set up Contact information: [address] [place] [address] [phone]         Neysa Tinnie BRAVO, PA. Schedule an appointment as soon as possible for a visit in 1 week(s).   Specialty: Physician Assistant Contact information: [address] [place] [address] [phone]  Major procedures and Radiology Reports - PLEASE review detailed and final reports thoroughly  -       DG Chest Port 1 View Result Date: 05/04/2024 CLINICAL DATA:  [id number]. Shortness of breath, multiple left rib fractures. [id number].  Hemothorax. EXAM: PORTABLE CHEST 1 VIEW COMPARISON:  Portable chest 05/02/2024 at 1:56 p.m. FINDINGS: 6:21 a.m. Multiple displaced left rib cage fractures are again noted. Today there is increased, near circumferential left pleural fluid or blood thickening the pleural margin up to 2 cm all the way to the apex. There is increased patchy consolidation in the left lower lung field. There is no measurable pneumothorax. There is increased faint haziness in the right upper lobe mid field which could be developing pneumonia. Remainder of the lungs appear clear. The cardiac size is stable. The aorta is heavily calcified. No new skeletal abnormality is seen. In all other respects, no further changes. IMPRESSION: 1. Increased, near circumferential left pleural fluid or blood thickening the pleural reflection up to 2 cm all the way to the apex. Consider CT with IV contrast to assess for active bleeding source. 2. Increased patchy consolidation in the left lower lung field. 3. Increased faint haziness in the right upper lobe mid field which could be developing pneumonia. 4. Multiple recent displaced left rib cage fractures. 5. Aortic atherosclerosis. 6. These results will be called to the ordering clinician or representative by the Radiologist Assistant, and communication documented in the PACS or [employer]. Electronically Signed   By: [name] Quam M.D.   On: 05/04/2024 07:05   DG CHEST PORT 1 VIEW Result Date: 05/02/2024 CLINICAL DATA:  [id number] Hemothorax 33403 EXAM: PORTABLE CHEST - 1 VIEW COMPARISON:  Multiple, most recently May 01, 2024 FINDINGS: Small left pleural effusion persists with left basilar airspace opacities, likely  atelectasis. No pneumothorax. Mild cardiomegaly. Tortuous aorta with aortic atherosclerosis. Multiple, mildly displaced left-sided rib fractures again noted. Multilevel thoracic osteophytosis. Osteopenia. IMPRESSION: Similar small left pleural effusion with left basilar atelectasis. No pneumothorax. Electronically Signed   By: Rogelia Myers M.D.   On: 05/02/2024 14:21   MR BRAIN WO CONTRAST Result Date: 05/01/2024 CLINICAL DATA:  Neuro deficit, acute, stroke suspected. EXAM: MRI HEAD WITHOUT CONTRAST TECHNIQUE: Multiplanar, multiecho pulse sequences of the brain and surrounding structures were obtained without intravenous contrast. COMPARISON:  CT head 04/30/2024. FINDINGS: Brain: No acute infarction, hemorrhage, hydrocephalus, extra-axial collection or mass lesion. Mild for age T2/FLAIR hyperintensities in the white matter, compatible with chronic microvascular ischemic disease. Motion limited study. Vascular: Normal flow voids. Skull and upper cervical spine: Normal marrow signal. Sinuses/Orbits: Negative. Other: None. IMPRESSION: No evidence of acute abnormality. Electronically Signed   By: Gilmore GORMAN Molt M.D.   On: 05/01/2024 19:28   DG CHEST PORT 1 VIEW Result Date: 05/01/2024 CLINICAL DATA:  Status post thoracentesis. EXAM: PORTABLE CHEST 1 VIEW COMPARISON:  Same day. FINDINGS: Stable cardiomediastinal silhouette. Left rib fractures are again noted. Small left pleural effusion is noted. No pneumothorax is noted. Right lung is clear. IMPRESSION: No pneumothorax is noted status post thoracentesis. Small left pleural effusion remains. Electronically Signed   By: Lynwood Landy Raddle M.D.   On: 05/01/2024 14:48   DG CHEST PORT 1 VIEW Result Date: 05/01/2024 CLINICAL DATA:  Loculated pleural effusion EXAM: PORTABLE CHEST 1 VIEW COMPARISON:  04/23/2024 FINDINGS: Extensive airspace opacity on the left with obscuration of the left heart border and left hemidiaphragm. Some of this may well be from pleural  effusion, but substantial atelectasis or basilar pneumonia  is not excluded. The right lung is grossly clear. Left rib fractures are identified. Atherosclerotic calcification of the aortic arch. The patient is rotated to the left on today's radiograph, reducing diagnostic sensitivity and specificity. IMPRESSION: 1. Extensive airspace opacity on the left with obscuration of the left heart border and left hemidiaphragm. Some of this may well be from pleural effusion, but substantial atelectasis or basilar pneumonia is not excluded. 2. Left rib fractures. 3. Aortic Atherosclerosis (ICD10-I70.0). Electronically Signed   By: Ryan Salvage M.D.   On: 05/01/2024 08:45   CT HEAD CODE STROKE WO CONTRAST Result Date: 04/30/2024 CLINICAL DATA:  Code stroke. Neuro deficit, acute, stroke suspected. Speech difficulty. Fall. EXAM: CT HEAD WITHOUT CONTRAST TECHNIQUE: Contiguous axial images were obtained from the base of the skull through the vertex without intravenous contrast. RADIATION DOSE REDUCTION: This exam was performed according to the departmental dose-optimization program which includes automated exposure control, adjustment of the mA and/or kV according to patient size and/or use of iterative reconstruction technique. COMPARISON:  Head CT earlier today and MRI 01/04/2021 FINDINGS: Brain: There is no evidence of an acute infarct, intracranial hemorrhage, mass, midline shift, or extra-axial fluid collection. There is mild cerebral atrophy. Cerebral white matter hypodensities are unchanged from today's earlier CT and are nonspecific but compatible with mild chronic small vessel ischemic disease. Vascular: Calcified atherosclerosis at the skull base. No hyperdense vessel. Skull: No acute fracture or suspicious lesion. Sinuses/Orbits: Mild mucosal thickening in the left maxillary sinus. Clear mastoid air cells. Bilateral cataract extraction. Other: Left supraorbital hematoma as noted earlier. ASPECTS (Alberta Stroke  Program Early CT Score) - Ganglionic level infarction (caudate, lentiform nuclei, internal capsule, insula, M1-M3 cortex): 7 - Supraganglionic infarction (M4-M6 cortex): 3 Total score (0-10 with 10 being normal): 10 These results were communicated to Dr. Merrianne at 10:40 am on 04/30/2024 by text page via the [facility] messaging system. IMPRESSION: 1. No evidence of acute intracranial abnormality. ASPECTS of 10. 2. Mild chronic small vessel ischemic disease. Electronically Signed   By: Dasie Hamburg M.D.   On: 04/30/2024 10:40   CT CHEST ABDOMEN PELVIS WO CONTRAST Result Date: 04/30/2024 CLINICAL DATA:  Status post fall from standing with head injury. EXAM: CT CHEST, ABDOMEN AND PELVIS WITHOUT CONTRAST TECHNIQUE: Multidetector CT imaging of the chest, abdomen and pelvis was performed following the standard protocol without IV contrast. RADIATION DOSE REDUCTION: This exam was performed according to the departmental dose-optimization program which includes automated exposure control, adjustment of the mA and/or kV according to patient size and/or use of iterative reconstruction technique. COMPARISON:  04/22/2024 FINDINGS: CT CHEST FINDINGS Cardiovascular: Heart size upper normal. No substantial pericardial effusion. Moderate atherosclerotic calcification is noted in the wall of the thoracic aorta. Mediastinum/Nodes: No mediastinal lymphadenopathy. 2.8 cm right thyroid nodule evident. This has been evaluated on previous imaging. (ref: J Am Coll Radiol. 2015 Feb;12(2): 143-50).No evidence for gross hilar lymphadenopathy although assessment is limited by the lack of intravenous contrast on the current study. Moderate hiatal hernia. The esophagus has normal imaging features. The There is no axillary lymphadenopathy. Lungs/Pleura: 6 mm left upper lobe nodule identified on 54/5. Fine architectural detail of lung parenchyma obscured by breathing motion. Ground-glass opacity identified posterior left upper lobe with left lower  lobe collapse/consolidation. Moderate left pleural effusion is progressive in the interval, potentially with some loculation towards the apex. Musculoskeletal: Multiple left-sided rib fractures noted. Patient had apparently acute fractures of the lateral left sixth and seventh ribs previously. There is now an acute  fracture in the left fifth rib suggesting interval re-injury. There is a new fracture in the posterior left sixth rib and probably a new fracture in the posterior left seventh rib. Fracture of the posterior left eighth ninth, and tenth ribs appear nonacute. New acute fracture identified in the posterior left tenth rib. No evidence for sternal or thoracic spine fracture CT ABDOMEN PELVIS FINDINGS Hepatobiliary: No suspicious focal abnormality in the liver on this study without intravenous contrast. Gallbladder is surgically absent. No intrahepatic or extrahepatic biliary dilation. Pancreas: No focal mass lesion. No dilatation of the main duct. No intraparenchymal cyst. No peripancreatic edema. Spleen: No splenomegaly. No suspicious focal mass lesion. Adrenals/Urinary Tract: No adrenal nodule or mass. Kidneys unremarkable. No evidence for hydroureter. The urinary bladder appears normal for the degree of distention. Stomach/Bowel: Moderate to large hiatal hernia. Duodenum is normally positioned as is the ligament of Treitz. No small bowel wall thickening. No small bowel dilatation. The terminal ileum is normal. No gross colonic mass. No colonic wall thickening. Diverticular changes are noted in the left colon without evidence of diverticulitis. Vascular/Lymphatic: There is moderate atherosclerotic calcification of the abdominal aorta without aneurysm. There is no gastrohepatic or hepatoduodenal ligament lymphadenopathy. No retroperitoneal or mesenteric lymphadenopathy. No pelvic sidewall lymphadenopathy. Reproductive: The uterus is surgically absent. There is no adnexal mass. Other: No intraperitoneal free  fluid. Musculoskeletal: No worrisome lytic or sclerotic osseous abnormality. No evidence for an acute fracture in the bony pelvis or lumbar spine. IMPRESSION: 1. Multiple left-sided rib fractures of varying chronicity. Fractures in the left fifth, sixth, seventh, and tenth ribs appear new since the 04/22/2024 exam. 2. Moderate left pleural effusion is progressive in the interval, potentially with some loculation towards the apex. No pneumothorax. 3. Left lower lobe collapse/consolidation with ground-glass opacity in the posterior left upper lobe. Imaging features could be related to atelectasis or pneumonia. 4. 6 mm left upper lobe pulmonary nodule. Multiple additional left upper lobe pulmonary nodule seen previously are obscured by collapse/consolidative disease on the current study. Follow-up warranted. 5. Moderate to large hiatal hernia. 6. Left colonic diverticulosis without diverticulitis. 7.  Aortic Atherosclerosis (ICD10-I70.0). Electronically Signed   By: Camellia Candle M.D.   On: 04/30/2024 06:13   DG Elbow Complete Left Result Date: 04/30/2024 EXAM: 3 VIEW(S) XRAY OF THE LEFT ELBOW COMPARISON: None available. CLINICAL HISTORY: Bruise. L fall after being given pain medication (oxycodone ). Minor abrasion on the left knee new, abrasion on the left elbow. FINDINGS: BONES AND JOINTS: No acute fracture. No focal osseous lesion. No joint dislocation. No effusion is present. SOFT TISSUES: Soft tissue swelling is present over the dorsum of the elbow. IMPRESSION: 1. No acute fracture or effusion. 2. Soft tissue swelling over the dorsum of the elbow. Electronically signed by: Lonni Necessary MD 04/30/2024 06:08 AM EDT RP Workstation: [id number]   DG Knee Left Port Result Date: 04/30/2024 EXAM: 2 VIEW(S) XRAY OF THE LEFT KNEE 04/30/2024 05:57:48 AM COMPARISON: None available. CLINICAL HISTORY: [id number] Bruise 13608. l fall after being given pain medication (oxycodone ).; Minor abrasion on the left knee new,  abrasion on the left elbow FINDINGS: BONES AND JOINTS: No acute fracture. No focal osseous lesion. No joint dislocation. No significant joint effusion. Mild degenerative changes are present in the lateral compartment. SOFT TISSUES: The soft tissues are unremarkable. Atherosclerotic calcifications are present. IMPRESSION: 1. No acute fracture or dislocation. 2. Mild degenerative changes in the lateral compartment. Electronically signed by: Lonni Necessary MD 04/30/2024 06:08 AM EDT RP Workstation: [id number]  CT CERVICAL SPINE WO CONTRAST Result Date: 04/30/2024 EXAM: CT CERVICAL SPINE WITHOUT CONTRAST 04/30/2024 05:47:34 AM TECHNIQUE: CT of the cervical spine was performed without the administration of intravenous contrast. Multiplanar reformatted images are provided for review. Automated exposure control, iterative reconstruction, and/or weight based adjustment of the mA/kV was utilized to reduce the radiation dose to as low as reasonably achievable. COMPARISON: CT of the cervical spine 04/22/2024. CLINICAL HISTORY: Polytrauma, blunt. FINDINGS: CERVICAL SPINE: BONES AND ALIGNMENT: No acute fracture or traumatic malalignment. Cervical lordosis is preserved. DEGENERATIVE CHANGES: Soft disc protrusions again noted at C3, C3-4, C6-7 and C7-T1. Uncovertebral and osseous foraminal narrowing is again noted bilaterally at C5-6. SOFT TISSUES: No prevertebral soft tissue swelling. IMPRESSION: 1. No acute fractures. 2. Soft disc protrusions at C3, C3-4, C6-7, and C7-T1. 3. Uncovertebral and osseous foraminal narrowing bilaterally at C5-6. Electronically signed by: Lonni Necessary MD 04/30/2024 05:55 AM EDT RP Workstation: [id number]   CT HEAD WO CONTRAST Result Date: 04/30/2024 EXAM: CT HEAD WITHOUT CONTRAST 04/30/2024 05:47:34 AM TECHNIQUE: CT of the head was performed without the administration of intravenous contrast. Automated exposure control, iterative reconstruction, and/or weight based adjustment of the  mA/kV was utilized to reduce the radiation dose to as low as reasonably achievable. COMPARISON: CT head without contrast 04/22/2024. CLINICAL HISTORY: Head trauma, moderate-severe. FINDINGS: BRAIN AND VENTRICLES: No acute hemorrhage. Gray-white differentiation is preserved. No hydrocephalus. No extra-axial collection. No mass effect or midline shift. Mild atrophy and white matter changes are stable. ORBITS: Bilateral lens replacements are noted. The globes and orbits are otherwise within normal limits. SINUSES: No acute abnormality. SOFT TISSUES AND SKULL: Left supraorbital scalp hematoma is present without underlying fracture or foreign body. VASCULATURE: Atherosclerotic calcifications are present in the cavernous carotid arteries bilaterally and at the dural margin of both vertebral arteries. No hyperdense vessel is present. IMPRESSION: 1. No acute intracranial abnormality related to head trauma. 2. Left supraorbital scalp hematoma without underlying fracture or foreign body. Electronically signed by: Lonni Necessary MD 04/30/2024 05:53 AM EDT RP Workstation: [id number]   DG Cerv Spine Flex&Ext Only Result Date: 04/23/2024 CLINICAL DATA:  Neck pain. EXAM: CERVICAL SPINE - FLEXION AND EXTENSION VIEWS ONLY COMPARISON:  CT cervical spine 04/22/2024. FINDINGS: The cervical spine is visualized from the occiput to the cervicothoracic junction. Very minimal grade 1 anterolisthesis of C3 on C4, most likely degenerative in etiology. Alignment is otherwise anatomic. Prevertebral soft tissues are within normal limits. Vertebral body height is maintained. Osteopenia. Multilevel endplate degenerative changes with uncovertebral and facet hypertrophy. Loss of disc space height at C2-3, C4-5 and C5-6. There is slight anterior motion at C3-4 with flexion, measuring up to 4 mm, compared to proximally 1-2 mm with neutral positioning. It does reduce completely with extension. IMPRESSION: 1. Grade 1 anterolisthesis of C3 on C4  with slight pathologic motion with flexion and extension. 2. Multilevel degenerative disc disease, as detailed above. 3. Osteopenia. Electronically Signed   By: Newell Eke M.D.   On: 04/23/2024 14:42   DG CHEST PORT 1 VIEW Result Date: 04/23/2024 CLINICAL DATA:  Rib fractures. EXAM: PORTABLE CHEST 1 VIEW COMPARISON:  04/13/2024 and CT chest 04/22/2024. FINDINGS: Trachea is midline. Heart is enlarged. Thoracic aorta is calcified. Large hiatal hernia. No airspace consolidation or pleural fluid. Mildly displaced left sixth rib fracture, as on 04/22/2024. Degenerative changes in the shoulders with high riding humeral heads bilaterally, indicative of chronic rotator cuff tears. Degenerative changes in the spine. IMPRESSION: 1. Mildly displaced acute left sixth posterolateral rib fracture.  2. Otherwise, no acute findings. 3. Large hiatal hernia. Electronically Signed   By: Newell Eke M.D.   On: 04/23/2024 10:27   CT Head Wo Contrast Result Date: 04/22/2024 CLINICAL DATA:  Head trauma, minor (Age >= 65y); Neck trauma (Age >= 65y) EXAM: CT HEAD WITHOUT CONTRAST CT CERVICAL SPINE WITHOUT CONTRAST TECHNIQUE: Multidetector CT imaging of the head and cervical spine was performed following the standard protocol without intravenous contrast. Multiplanar CT image reconstructions of the cervical spine were also generated. RADIATION DOSE REDUCTION: This exam was performed according to the departmental dose-optimization program which includes automated exposure control, adjustment of the mA and/or kV according to patient size and/or use of iterative reconstruction technique. COMPARISON:  Ultrasound thyroid neck 02/23/2020, CT head and C-spine 04/21/2024 FINDINGS: CT HEAD FINDINGS Brain: Patchy and confluent areas of decreased attenuation are noted throughout the deep and periventricular white matter of the cerebral hemispheres bilaterally, compatible with chronic microvascular ischemic disease. No evidence of  large-territorial acute infarction. No parenchymal hemorrhage. No mass lesion. No extra-axial collection. No mass effect or midline shift. No hydrocephalus. Basilar cisterns are patent. Vascular: No hyperdense vessel. Atherosclerotic calcifications are present within the cavernous internal carotid and vertebral arteries. Skull: No acute fracture or focal lesion. Sinuses/Orbits: Left frontal sinus mucosal thickening. Otherwise paranasal sinuses and mastoid air cells are clear. Bilateral lens replacement. Otherwise the orbits are unremarkable. Other: None. CT CERVICAL SPINE FINDINGS Alignment: Normal. Skull base and vertebrae: Multilevel moderate degenerative changes spine. No associated severe osseous neural foraminal or central canal stenosis. No acute fracture. No aggressive appearing focal osseous lesion or focal pathologic process. Soft tissues and spinal canal: No prevertebral fluid or swelling. No visible canal hematoma. Upper chest: Trace left pneumothorax. Other: Enlarged right thyroid gland with question underlying hypodense nodule measuring up to 3 cm. IMPRESSION: 1. Trace left pneumothorax. Please see separately dictated CT chest 04/22/2024. 2. No acute intracranial abnormality. 3. No acute displaced fracture or traumatic listhesis of the cervical spine. 4. Enlarged right thyroid gland with question underlying hypodense nodule measuring up to 3 cm. This has been evaluated on previous imaging ultrasound thyroid neck 02/23/2020. (ref: J Am Coll Radiol. 2015 Feb;12(2): 143-50). Electronically Signed   By: Morgane  Naveau M.D.   On: 04/22/2024 21:53   CT Cervical Spine Wo Contrast Result Date: 04/22/2024 CLINICAL DATA:  Head trauma, minor (Age >= 65y); Neck trauma (Age >= 65y) EXAM: CT HEAD WITHOUT CONTRAST CT CERVICAL SPINE WITHOUT CONTRAST TECHNIQUE: Multidetector CT imaging of the head and cervical spine was performed following the standard protocol without intravenous contrast. Multiplanar CT image  reconstructions of the cervical spine were also generated. RADIATION DOSE REDUCTION: This exam was performed according to the departmental dose-optimization program which includes automated exposure control, adjustment of the mA and/or kV according to patient size and/or use of iterative reconstruction technique. COMPARISON:  Ultrasound thyroid neck 02/23/2020, CT head and C-spine 04/21/2024 FINDINGS: CT HEAD FINDINGS Brain: Patchy and confluent areas of decreased attenuation are noted throughout the deep and periventricular white matter of the cerebral hemispheres bilaterally, compatible with chronic microvascular ischemic disease. No evidence of large-territorial acute infarction. No parenchymal hemorrhage. No mass lesion. No extra-axial collection. No mass effect or midline shift. No hydrocephalus. Basilar cisterns are patent. Vascular: No hyperdense vessel. Atherosclerotic calcifications are present within the cavernous internal carotid and vertebral arteries. Skull: No acute fracture or focal lesion. Sinuses/Orbits: Left frontal sinus mucosal thickening. Otherwise paranasal sinuses and mastoid air cells are clear. Bilateral lens replacement. Otherwise the  orbits are unremarkable. Other: None. CT CERVICAL SPINE FINDINGS Alignment: Normal. Skull base and vertebrae: Multilevel moderate degenerative changes spine. No associated severe osseous neural foraminal or central canal stenosis. No acute fracture. No aggressive appearing focal osseous lesion or focal pathologic process. Soft tissues and spinal canal: No prevertebral fluid or swelling. No visible canal hematoma. Upper chest: Trace left pneumothorax. Other: Enlarged right thyroid gland with question underlying hypodense nodule measuring up to 3 cm. IMPRESSION: 1. Trace left pneumothorax. Please see separately dictated CT chest 04/22/2024. 2. No acute intracranial abnormality. 3. No acute displaced fracture or traumatic listhesis of the cervical spine. 4.  Enlarged right thyroid gland with question underlying hypodense nodule measuring up to 3 cm. This has been evaluated on previous imaging ultrasound thyroid neck 02/23/2020. (ref: J Am Coll Radiol. 2015 Feb;12(2): 143-50). Electronically Signed   By: Morgane  Naveau M.D.   On: 04/22/2024 21:53   CT CHEST ABDOMEN PELVIS WO CONTRAST Result Date: 04/22/2024 CLINICAL DATA:  Clemens yesterday. Left shoulder and left chest and leg pain. EXAM: CT CHEST, ABDOMEN AND PELVIS WITHOUT CONTRAST TECHNIQUE: Multidetector CT imaging of the chest, abdomen and pelvis was performed following the standard protocol without IV contrast. RADIATION DOSE REDUCTION: This exam was performed according to the departmental dose-optimization program which includes automated exposure control, adjustment of the mA and/or kV according to patient size and/or use of iterative reconstruction technique. COMPARISON:  CT scan 04/13/2024 FINDINGS: CT CHEST FINDINGS Cardiovascular: The heart is within normal limits in size for age. No pericardial effusion. Stable tortuosity and calcification of the thoracic aorta and stable three-vessel coronary artery calcifications. Mediastinum/Nodes: No mediastinal or hilar mass or lymphadenopathy. 3.0 x 2.6 cm right thyroid lesion is unchanged. In the setting of significant comorbidities or limited life expectancy, no follow-up recommended (ref: J Am Coll Radiol. 2015 Feb;12(2): 143-50).The esophagus is stable. Large hiatal hernia. Lungs/Pleura: Patchy inflammations/edema and small left pleural effusion with left lower lobe atelectasis. No pneumothorax. Musculoskeletal: There is a displaced left seventh lateral rib fracture with maximum displacement 11 mm. No associated pneumothorax. There is small pleural hematoma and adjacent atelectasis or contusion. There are also numerous posterior left rib fractures which are subacute/healing. The sternum and thoracic vertebral bodies are intact. CT ABDOMEN PELVIS FINDINGS  Hepatobiliary: No hepatic lesions are identified without contrast. No evidence of acute hepatic injury. No perihepatic fluid collections. The gallbladder is surgically absent. No common bile duct dilatation. Pancreas: Stable pancreatic atrophy but no mass or inflammation. Spleen: No acute splenic injury or perisplenic fluid collection. Adrenals/Urinary Tract: Adrenal glands and kidneys unremarkable and stable. The bladder is. Stomach/Bowel: Large hiatal hernia. Otherwise the stomach is unremarkable. The small bowel and colon are grossly normal without oral contrast. No obstructive findings. Severe sigmoid colon diverticulosis without findings for acute diverticulitis. Vascular/Lymphatic: Stable aortic and branch vessel calcifications but no aneurysm. Reproductive: Surgically absent. The uterus is surgically absent. The left ovary is still present and appears normal. I do not see the right ovary for certain but no adnexal mass. Other: No pelvic mass or adenopathy. No free pelvic fluid collections. No inguinal mass or adenopathy. No abdominal wall hernia or subcutaneous lesions. Musculoskeletal: No acute lumbar spine fracture. The bony pelvis is intact. Both hips are normally located. No hip fracture. IMPRESSION: 1. New/acute displaced left seventh lateral rib fracture with maximum displacement 11 mm. No associated pneumothorax. 2. Numerous posterior left rib fractures which are subacute/healing. 3. Patchy inflammation/edema and small left pleural effusion with left lower lobe atelectasis. 4.  No acute abdominal/pelvic findings, mass lesions or adenopathy. 5. Large hiatal hernia. 6. Severe sigmoid colon diverticulosis without findings for acute diverticulitis. 7. Aortic atherosclerosis. Aortic Atherosclerosis (ICD10-I70.0). Electronically Signed   By: MYRTIS Stammer M.D.   On: 04/22/2024 21:50   DG Shoulder Left Result Date: 04/22/2024 CLINICAL DATA:  Shoulder pain after fall EXAM: LEFT SHOULDER - 2+ VIEW COMPARISON:   None Available. FINDINGS: There is no acute fracture or dislocation. There is severe glenohumeral and subacromial joint space narrowing sclerosis and osteophyte formation. No acute fracture or dislocation. There is soft tissue calcifications adjacent to the greater tuberosity likely related to calcific tendinitis. The bones are diffusely osteopenic. IMPRESSION: 1. No acute fracture or dislocation. 2. Severe glenohumeral and subacromial degenerative changes. Findings compatible with rotator cuff tendinopathy. 3. Calcific tendinitis. Electronically Signed   By: Greig Pique M.D.   On: 04/22/2024 21:31   CT Head Wo Contrast Result Date: 04/21/2024 CLINICAL DATA:  Clemens.  Hit head. EXAM: CT HEAD WITHOUT CONTRAST CT CERVICAL SPINE WITHOUT CONTRAST TECHNIQUE: Multidetector CT imaging of the head and cervical spine was performed following the standard protocol without intravenous contrast. Multiplanar CT image reconstructions of the cervical spine were also generated. RADIATION DOSE REDUCTION: This exam was performed according to the departmental dose-optimization program which includes automated exposure control, adjustment of the mA and/or kV according to patient size and/or use of iterative reconstruction technique. COMPARISON:  Head CT 04/13/2024 and cervical spine CT 04/07/2024 FINDINGS: CT HEAD FINDINGS Brain: Stable age related cerebral atrophy, ventriculomegaly and periventricular white matter disease. No extra-axial fluid collections are identified. No CT findings for acute hemispheric infarction or intracranial hemorrhage. No mass lesions. The brainstem and cerebellum are normal. Vascular: Stable vascular calcifications. No aneurysm hyperdense vessels. Skull: No acute skull fracture. Sinuses/Orbits: Scattered ethmoid sinus disease. The mastoid air cells and middle ear cavities are clear. The globes are intact. Other: Small left frontal scalp hematoma. CT CERVICAL SPINE FINDINGS Alignment: The overall  alignment is maintained. Stable degenerative subluxations and facet disease. Skull base and vertebrae: No acute fracture. No primary bone lesion or focal pathologic process. Soft tissues and spinal canal: No prevertebral fluid or swelling. No visible canal hematoma. Disc levels: The spinal canal is fairly generous. No large disc protrusions or canal stenosis. Stable mild multilevel bony foraminal stenosis due to uncinate spurring and facet disease. Upper chest: The lung apices are grossly clear. Other: Stable large right thyroid goiter with a 3.4 cm right thyroid lesion. This has been evaluated on previous imaging. (ref: J Am Coll Radiol. 2015 Feb;12(2): 143-50). IMPRESSION: 1. Stable age related cerebral atrophy, ventriculomegaly and periventricular white matter disease. 2. No acute intracranial findings or skull fracture. 3. Small left frontal scalp hematoma. 4. Stable degenerative cervical spondylosis with multilevel disc disease and facet disease but no acute cervical spine fracture. Electronically Signed   By: MYRTIS Stammer M.D.   On: 04/21/2024 10:14   CT Cervical Spine Wo Contrast Result Date: 04/21/2024 CLINICAL DATA:  Clemens.  Hit head. EXAM: CT HEAD WITHOUT CONTRAST CT CERVICAL SPINE WITHOUT CONTRAST TECHNIQUE: Multidetector CT imaging of the head and cervical spine was performed following the standard protocol without intravenous contrast. Multiplanar CT image reconstructions of the cervical spine were also generated. RADIATION DOSE REDUCTION: This exam was performed according to the departmental dose-optimization program which includes automated exposure control, adjustment of the mA and/or kV according to patient size and/or use of iterative reconstruction technique. COMPARISON:  Head CT 04/13/2024 and cervical spine CT 04/07/2024  FINDINGS: CT HEAD FINDINGS Brain: Stable age related cerebral atrophy, ventriculomegaly and periventricular white matter disease. No extra-axial fluid collections are  identified. No CT findings for acute hemispheric infarction or intracranial hemorrhage. No mass lesions. The brainstem and cerebellum are normal. Vascular: Stable vascular calcifications. No aneurysm hyperdense vessels. Skull: No acute skull fracture. Sinuses/Orbits: Scattered ethmoid sinus disease. The mastoid air cells and middle ear cavities are clear. The globes are intact. Other: Small left frontal scalp hematoma. CT CERVICAL SPINE FINDINGS Alignment: The overall alignment is maintained. Stable degenerative subluxations and facet disease. Skull base and vertebrae: No acute fracture. No primary bone lesion or focal pathologic process. Soft tissues and spinal canal: No prevertebral fluid or swelling. No visible canal hematoma. Disc levels: The spinal canal is fairly generous. No large disc protrusions or canal stenosis. Stable mild multilevel bony foraminal stenosis due to uncinate spurring and facet disease. Upper chest: The lung apices are grossly clear. Other: Stable large right thyroid goiter with a 3.4 cm right thyroid lesion. This has been evaluated on previous imaging. (ref: J Am Coll Radiol. 2015 Feb;12(2): 143-50). IMPRESSION: 1. Stable age related cerebral atrophy, ventriculomegaly and periventricular white matter disease. 2. No acute intracranial findings or skull fracture. 3. Small left frontal scalp hematoma. 4. Stable degenerative cervical spondylosis with multilevel disc disease and facet disease but no acute cervical spine fracture. Electronically Signed   By: MYRTIS Stammer M.D.   On: 04/21/2024 10:14   CT HEAD WO CONTRAST Result Date: 04/13/2024 CLINICAL DATA:  Head trauma, moderate-severe EXAM: CT HEAD WITHOUT CONTRAST TECHNIQUE: Contiguous axial images were obtained from the base of the skull through the vertex without intravenous contrast. RADIATION DOSE REDUCTION: This exam was performed according to the departmental dose-optimization program which includes automated exposure control,  adjustment of the mA and/or kV according to patient size and/or use of iterative reconstruction technique. COMPARISON:  04/07/2024 FINDINGS: Brain: There is atrophy and chronic small vessel disease changes. No acute intracranial abnormality. Specifically, no hemorrhage, hydrocephalus, mass lesion, acute infarction, or significant intracranial injury. Vascular: No hyperdense vessel or unexpected calcification. Skull: No acute calvarial abnormality. Sinuses/Orbits: No acute findings Other: None IMPRESSION: Atrophy, chronic microvascular disease. No acute intracranial abnormality. Electronically Signed   By: Franky Crease M.D.   On: 04/13/2024 20:25   CT CERVICAL SPINE WO CONTRAST Result Date: 04/13/2024 CLINICAL DATA:  Polytrauma, blunt EXAM: CT CERVICAL SPINE WITHOUT CONTRAST TECHNIQUE: Multidetector CT imaging of the cervical spine was performed without intravenous contrast. Multiplanar CT image reconstructions were also generated. RADIATION DOSE REDUCTION: This exam was performed according to the departmental dose-optimization program which includes automated exposure control, adjustment of the mA and/or kV according to patient size and/or use of iterative reconstruction technique. COMPARISON:  None Available. FINDINGS: Alignment: Normal Skull base and vertebrae: No acute fracture. No primary bone lesion or focal pathologic process. Soft tissues and spinal canal: No prevertebral fluid or swelling. No visible canal hematoma. Disc levels: Disc space narrowing and spurring diffusely. Moderate degenerative facet disease, left greater than right. Multilevel bilateral neural foraminal narrowing. Upper chest: No acute findings Other: 3 cm right thyroid nodule. This has been evaluated on previous imaging. (ref: J Am Coll Radiol. 2015 Feb;12(2): 143-50). IMPRESSION: Degenerative disc and facet disease. No acute bony abnormality. Electronically Signed   By: Franky Crease M.D.   On: 04/13/2024 20:24   CT CHEST ABDOMEN  PELVIS WO CONTRAST Result Date: 04/13/2024 CLINICAL DATA:  Polytrauma, blunt EXAM: CT CHEST, ABDOMEN AND PELVIS WITHOUT CONTRAST TECHNIQUE:  Multidetector CT imaging of the chest, abdomen and pelvis was performed following the standard protocol without IV contrast. RADIATION DOSE REDUCTION: This exam was performed according to the departmental dose-optimization program which includes automated exposure control, adjustment of the mA and/or kV according to patient size and/or use of iterative reconstruction technique. COMPARISON:  None Available. FINDINGS: CT CHEST FINDINGS Cardiovascular: Heart is normal size. Three-vessel coronary artery disease and diffuse aortic atherosclerosis. No aortic aneurysm. Mediastinum/Nodes: Large hiatal hernia. No mediastinal, hilar, or axillary adenopathy. Right thyroid nodule measures 3 cm. This has been evaluated on previous imaging. (ref: J Am Coll Radiol. 2015 Feb;12(2): 143-50). Lungs/Pleura: Trace left pleural effusion, similar to prior study. Two small left lobe pulmonary nodules measuring up to 3 mm. No confluent airspace opacities. Linear scarring or atelectasis in the lung bases. No pneumothorax. Musculoskeletal: Chest wall soft tissues are unremarkable. Multiple chronic appearing posterior left rib fractures. These involve the posterior left 9th through 11th ribs. There is a fracture through the lateral left 10th rib fracture which was not definitively seen on prior study and may reflect an acute rib fracture. CT ABDOMEN PELVIS FINDINGS Hepatobiliary: No focal liver abnormality is seen. Status post cholecystectomy. No biliary dilatation. No evidence of a hepatic injury. Pancreas: No focal abnormality or ductal dilatation. Spleen: No focal abnormality.  Normal size. Adrenals/Urinary Tract: No adrenal abnormality. No focal renal abnormality. No stones or hydronephrosis. Urinary bladder is unremarkable. Stomach/Bowel: Diffuse colonic diverticulosis. No active diverticulitis.  Stomach and small bowel decompressed. No bowel obstruction or inflammatory process. Vascular/Lymphatic: Aortic atherosclerosis. No evidence of aneurysm or adenopathy. Reproductive: Prior hysterectomy.  No adnexal masses. Other: No free fluid or free air. Musculoskeletal: No acute bony abnormality. IMPRESSION: Trace left pleural effusion, similar to prior study. Probable acute left lateral 10th rib fracture. Multiple old posterior left rib fractures. Coronary artery disease, aortic atherosclerosis. Large hiatal hernia. Small left upper lobe pulmonary nodules measuring up to 3 mm. No follow-up needed if patient is low-risk (and has no known or suspected primary neoplasm). Non-contrast chest CT can be considered in 12 months if patient is high-risk. This recommendation follows the consensus statement: Guidelines for Management of Incidental Pulmonary Nodules Detected on CT Images: From the Fleischner Society 2017; Radiology 2017; 284:228-243. Colonic diverticulosis.  No active diverticulitis. Electronically Signed   By: Franky Crease M.D.   On: 04/13/2024 20:22   DG Pelvis Portable Result Date: 04/13/2024 CLINICAL DATA:  Initial evaluation for acute trauma, fall. EXAM: PORTABLE PELVIS 1-2 VIEWS COMPARISON:  Prior study from 02/24/2024 FINDINGS: Diffuse osteopenia noted, limiting evaluation of fine osseous detail. No acute fracture dislocation. Bony pelvis intact. SI joints approximated. No pubic diastasis. Osteoarthritic changes noted about the hips. Advanced spondylosis within the lower lumbar spine. No visible soft tissue injury. Scattered vascular calcifications noted. IMPRESSION: No acute osseous abnormality about the pelvis. Electronically Signed   By: Morene Hoard M.D.   On: 04/13/2024 19:05   DG Shoulder Left Port Result Date: 04/13/2024 CLINICAL DATA:  Initial evaluation for acute trauma, fall. EXAM: LEFT SHOULDER COMPARISON:  Prior radiograph from 02/24/2024 FINDINGS: Diffuse osteopenia noted,  somewhat limiting evaluation of fine osseous detail. No acute fracture dislocation. 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 No visible soft tissue abnormality. Visualized left hemithorax is clear. IMPRESSION: 1. No acute osseous abnormality about the left shoulder. 2. 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 Electronically Signed   By: Morene Hoard M.D.   On: 04/13/2024 19:04   DG Chest [facility] 1 View Result  Date: 04/13/2024 CLINICAL DATA:  Initial evaluation for acute trauma, fall. EXAM: PORTABLE CHEST 1 VIEW COMPARISON:  Radiograph from 02/24/2024 FINDINGS: Cardiomegaly, stable. Mediastinal silhouette within normal limits. Aortic atherosclerosis. Lungs normally inflated. Mild chronic coarsening of the interstitial markings noted. No focal infiltrates. No pulmonary edema. Minimal blunting of the left costophrenic angle, suggesting a trace left pleural effusion. No pneumothorax. Diffuse osteopenia. No acute osseous finding. Osteoarthritic changes noted about the shoulders bilaterally. Degenerative spondylosis noted within the visualized spine. IMPRESSION: 1. No radiographic evidence for active cardiopulmonary disease. 2. Cardiomegaly without pulmonary edema. 3. Probable trace left pleural effusion. Electronically Signed   By: Morene Hoard M.D.   On: 04/13/2024 19:02   CT Cervical Spine Wo Contrast Result Date: 04/07/2024 CLINICAL DATA:  Neck trauma.  Unwitnessed fall. EXAM: CT CERVICAL SPINE WITHOUT CONTRAST TECHNIQUE: Multidetector CT imaging of the cervical spine was performed without intravenous contrast. Multiplanar CT image reconstructions were also generated. RADIATION DOSE REDUCTION: This exam was performed according to the departmental dose-optimization program which includes automated exposure control, adjustment of the mA and/or kV according to patient size and/or use of iterative reconstruction technique. COMPARISON:  CT cervical  spine 06/10/2023. Report from thyroid ultrasound 02/23/2020 FINDINGS: Alignment: Mildly exaggerated cervical lordosis. No focal angulation or significant listhesis. Skull base and vertebrae: No evidence of acute cervical spine fracture or traumatic subluxation. Soft tissues and spinal canal: No prevertebral fluid or swelling. No visible canal hematoma. Disc levels: Similar mild multilevel spondylosis with disc space narrowing, endplate osteophytes and uncinate spurring. There is asymmetric facet hypertrophy on the left at C3-4 and C4-5. Resulting multilevel spinal stenosis and foraminal narrowing, similar to previous study. Upper chest: 3.5 x 2.9 cm right thyroid nodule is grossly unchanged. This has been previously evaluated by thyroid ultrasound. Clear lung apices. Other: None. IMPRESSION: 1. No evidence of acute cervical spine fracture, traumatic subluxation or static signs of instability. 2. Similar multilevel cervical spondylosis with resulting multilevel spinal stenosis and foraminal narrowing. 3. Stable right thyroid nodule, previously evaluated by ultrasound. Electronically Signed   By: Elsie Perone M.D.   On: 04/07/2024 14:30   CT Head Wo Contrast Result Date: 04/07/2024 CLINICAL DATA:  Head trauma, minor (Age >= 65y).  Fall. EXAM: CT HEAD WITHOUT CONTRAST TECHNIQUE: Contiguous axial images were obtained from the base of the skull through the vertex without intravenous contrast. RADIATION DOSE REDUCTION: This exam was performed according to the departmental dose-optimization program which includes automated exposure control, adjustment of the mA and/or kV according to patient size and/or use of iterative reconstruction technique. COMPARISON:  02/24/2024 FINDINGS: Brain: There is atrophy and chronic small vessel disease changes. No acute intracranial abnormality. Specifically, no hemorrhage, hydrocephalus, mass lesion, acute infarction, or significant intracranial injury. Vascular: No hyperdense vessel  or unexpected calcification. Skull: No acute calvarial abnormality. Sinuses/Orbits: No acute findings Other: None IMPRESSION: Atrophy, chronic microvascular disease. No acute intracranial abnormality. Electronically Signed   By: Franky Crease M.D.   On: 04/07/2024 14:27    Micro Results    Recent Results (from the past 240 hours)  Body fluid culture w Gram Stain     Status: None   Collection Time: 05/01/24  2:18 PM   Specimen: Pleural Fluid  Result Value Ref Range Status   Specimen Description FLUID PLEURAL  Final   Special Requests NONE  Final   Gram Stain NO WBC SEEN NO ORGANISMS SEEN   Final   Culture   Final    NO GROWTH 3 DAYS Performed  at [facility], [address], [place], [place] [address]    Report Status 05/04/2024 FINAL  Final    Today   Subjective    Nyliah Cranford today has no headache,no chest abdominal pain,no new weakness tingling or numbness, feels much better    Objective   Blood pressure (!) 166/67, pulse 86, temperature 98.3 F (36.8 C), temperature source Oral, resp. rate 14, weight 59.4 kg, SpO2 93%.   Intake/Output Summary (Last 24 hours) at 05/06/2024 0859 Last data filed at 05/05/2024 1800 Gross per 24 hour  Intake 940 ml  Output --  Net 940 ml    Exam  Awake Alert, No new F.N deficits,    [place].AT,PERRAL Supple Neck,   Symmetrical Chest wall movement, Good air movement bilaterally, CTAB RRR,No Gallops,   +ve B.Sounds, Abd Soft, Non tender,  Fall related left frontal hematoma   Data Review   Recent Labs  Lab 04/30/24 0625 04/30/24 0631 04/30/24 1024 04/30/24 1027 05/02/24 0554 05/04/24 0606  WBC 9.9  --  9.8  --  13.1* 10.9*  HGB 12.0 13.6 12.2 13.3 12.4 10.4*  HCT 35.7* 40.0 37.0 39.0 37.5 32.3*  PLT 406*  --  344  --  417* 389  MCV 77.4*  --  77.6*  --  77.3* 77.3*  MCH 26.0  --  25.6*  --  25.6* 24.9*  MCHC 33.6  --  33.0  --  33.1 32.2  RDW 16.0*  --  15.9*  --  16.3* 16.2*  LYMPHSABS  --   --  1.7  --   --  1.8   MONOABS  --   --  1.2*  --   --  1.1*  EOSABS  --   --  0.2  --   --  0.4  BASOSABS  --   --  0.0  --   --  0.0    Recent Labs  Lab 04/30/24 0625 04/30/24 0631 04/30/24 1024 04/30/24 1027 05/02/24 0554 05/04/24 0606 05/06/24 0521  NA 129*   < > 129* 130* 135 136 132*  K 4.3   < > 4.3 4.3 3.8 3.2* 3.6  CL 93*   < > 95* 97* 100 102 98  CO2 24  --  23  --  22 26 23   ANIONGAP 12  --  11  --  13 8 11   GLUCOSE 322*   < > 295* 299* 353* 200* 235*  BUN 18   < > 16 18 19 23 13   CREATININE 0.81   < > 0.68 0.70 0.81 0.65 0.61  AST 26  --  25  --   --   --   --   ALT 23  --  20  --   --   --   --   ALKPHOS 148*  --  146*  --   --   --   --   BILITOT 0.9  --  0.9  --   --   --   --   ALBUMIN 2.8*  --  2.8*  --   --   --   --   PROCALCITON  --   --  <0.10  --   --   --   --   INR 1.0  --  1.0  --   --   --   --   TSH  --   --  1.325  --   --   --   --  HGBA1C  --   --  9.8*  --   --   --   --   MG 1.6*  --   --   --   --  1.4* 1.5*  PHOS  --   --   --   --   --  3.2 2.4*  CALCIUM  9.5  --  9.2  --  9.6 8.7* 9.0   < > = values in this interval not displayed.    Total Time in preparing paper work, data evaluation and todays exam - 35 minutes  Signature  -    Lavada Stank M.D on 05/06/2024 at 8:59 AM   -  To page go to [email or web address]

## 2024-05-06 NOTE — Discharge Instructions (Addendum)
 Follow with Primary MD Neysa Tinnie BRAVO, PA in 7 days   Get CBC, CMP, Magnesium , phosphorus, 2 view Chest X ray -  checked next visit with your primary MD or SNF MD   Activity: As tolerated with Full fall precautions use walker/cane & assistance as needed  Disposition memory care unit  Diet: Heart Healthy low carbohydrate diet, CBGs q. [facility] S  Special Instructions: If you have smoked or chewed Tobacco  in the last 2 yrs please stop smoking, stop any regular Alcohol   and or any Recreational drug use.  On your next visit with your primary care physician please Get Medicines reviewed and adjusted.  Please request your Prim.MD to go over all Hospital Tests and Procedure/Radiological results at the follow up, please get all Hospital records sent to your Prim MD by signing hospital release before you go home.  If you experience worsening of your admission symptoms, develop shortness of breath, life threatening emergency, suicidal or homicidal thoughts you must seek medical attention immediately by calling 911 or calling your MD immediately  if symptoms less severe.  You Must read complete instructions/literature along with all the possible adverse reactions/side effects for all the Medicines you take and that have been prescribed to you. Take any new Medicines after you have completely understood and accpet all the possible adverse reactions/side effects.   Do not drive when taking Pain medications.  Do not take more than prescribed Pain, Sleep and Anxiety Medications  Wear Seat belts while driving.

## 2024-07-03 ENCOUNTER — Emergency Department (HOSPITAL_COMMUNITY)
Admission: EM | Admit: 2024-07-03 | Discharge: 2024-07-03 | Disposition: A | Discharge status: Skilled Nursing Facility | Attending: Emergency Medicine | Admitting: Emergency Medicine

## 2024-07-03 ENCOUNTER — Emergency Department (HOSPITAL_COMMUNITY)

## 2024-07-03 ENCOUNTER — Other Ambulatory Visit: Payer: Self-pay

## 2024-07-03 ENCOUNTER — Encounter (HOSPITAL_COMMUNITY): Payer: Self-pay | Admitting: Emergency Medicine

## 2024-07-03 DIAGNOSIS — Z7982 Long term (current) use of aspirin: Secondary | ICD-10-CM | POA: Diagnosis not present

## 2024-07-03 DIAGNOSIS — S0990XA Unspecified injury of head, initial encounter: Secondary | ICD-10-CM | POA: Diagnosis present

## 2024-07-03 DIAGNOSIS — R739 Hyperglycemia, unspecified: Secondary | ICD-10-CM | POA: Diagnosis not present

## 2024-07-03 DIAGNOSIS — W01198A Fall on same level from slipping, tripping and stumbling with subsequent striking against other object, initial encounter: Secondary | ICD-10-CM | POA: Insufficient documentation

## 2024-07-03 DIAGNOSIS — R410 Disorientation, unspecified: Secondary | ICD-10-CM | POA: Diagnosis not present

## 2024-07-03 DIAGNOSIS — Z794 Long term (current) use of insulin: Secondary | ICD-10-CM | POA: Insufficient documentation

## 2024-07-03 DIAGNOSIS — Z79899 Other long term (current) drug therapy: Secondary | ICD-10-CM | POA: Insufficient documentation

## 2024-07-03 DIAGNOSIS — W19XXXA Unspecified fall, initial encounter: Secondary | ICD-10-CM

## 2024-07-03 LAB — CBC WITH DIFFERENTIAL/PLATELET
Abs Immature Granulocytes: 0.04 K/uL (ref 0.00–0.07)
Basophils Absolute: 0 K/uL (ref 0.0–0.1)
Basophils Relative: 0 %
Eosinophils Absolute: 0.1 K/uL (ref 0.0–0.5)
Eosinophils Relative: 1 %
HCT: 38.6 % (ref 36.0–46.0)
Hemoglobin: 12.1 g/dL (ref 12.0–15.0)
Immature Granulocytes: 1 %
Lymphocytes Relative: 18 %
Lymphs Abs: 1.5 K/uL (ref 0.7–4.0)
MCH: 24.8 pg — ABNORMAL LOW (ref 26.0–34.0)
MCHC: 31.3 g/dL (ref 30.0–36.0)
MCV: 79.3 fL — ABNORMAL LOW (ref 80.0–100.0)
Monocytes Absolute: 0.8 K/uL (ref 0.1–1.0)
Monocytes Relative: 9 %
Neutro Abs: 6 K/uL (ref 1.7–7.7)
Neutrophils Relative %: 71 %
Platelets: 327 K/uL (ref 150–400)
RBC: 4.87 MIL/uL (ref 3.87–5.11)
RDW: 19.2 % — ABNORMAL HIGH (ref 11.5–15.5)
WBC: 8.5 K/uL (ref 4.0–10.5)
nRBC: 0 % (ref 0.0–0.2)

## 2024-07-03 LAB — URINALYSIS, ROUTINE W REFLEX MICROSCOPIC
Bacteria, UA: NONE SEEN
Bilirubin Urine: NEGATIVE
Glucose, UA: >=500 mg/dL — AB
Hgb urine dipstick: NEGATIVE
Ketones, ur: 5 mg/dL — AB
Leukocytes,Ua: NEGATIVE
Nitrite: NEGATIVE
Protein, ur: NEGATIVE mg/dL
Specific Gravity, Urine: 1.012 (ref 1.005–1.030)
pH: 7 (ref 5.0–8.0)

## 2024-07-03 LAB — BASIC METABOLIC PANEL WITH GFR
Anion gap: 13 (ref 5–15)
BUN: 20 mg/dL (ref 8–23)
CO2: 22 mmol/L (ref 22–32)
Calcium: 9.7 mg/dL (ref 8.9–10.3)
Chloride: 98 mmol/L (ref 98–111)
Creatinine, Ser: 0.81 mg/dL (ref 0.44–1.00)
GFR, Estimated: >60 mL/min (ref >60)
Glucose, Bld: 335 mg/dL — ABNORMAL HIGH (ref 70–99)
Potassium: 4.3 mmol/L (ref 3.5–5.1)
Sodium: 133 mmol/L — ABNORMAL LOW (ref 135–145)

## 2024-07-03 NOTE — ED Triage Notes (Signed)
 PT BIB [facility]. Per EMS PT had an unwitnessed fall. According to PT she tripped and hit her arm against the heater. PT denies, LOC, hitting her head, N/V, Dizziness. Nursing home staff requested PT be tested for a UTI

## 2024-07-03 NOTE — Progress Notes (Signed)
 Patient informed that urine sample is needed. Toileting offered. Patient states unable to void at this time

## 2024-07-03 NOTE — Discharge Instructions (Signed)
 You were seen in the emergency department after a fall.  You had a CAT scan of your head along with lab work and a urinalysis.  The only significant abnormality was an elevated blood sugar of 335.  Please follow-up with your primary care doctor and return to the emergency department if any worsening or concerning symptoms.

## 2024-07-03 NOTE — ED Provider Notes (Signed)
  EMERGENCY DEPARTMENT AT [facility] Provider Note   CSN: [id number] Arrival date & time: 07/03/24  9341     Patient presents with: Kiara Blair is a 89 y.o. female.  She is brought in by ambulance after a fall at her facility.  Level 5 caveat secondary to dementia.  Patient does recall a fall although cannot tell me the situation.  She thinks she may have hit her head.  Denies any complaints though.  Per EMS staff is also requesting a urine test.  No other history available at this time.  She does have a DNR form with her   The history is provided by the patient and the EMS personnel.  Fall This is a new problem. The problem has not changed since onset.Pertinent negatives include no chest pain, no abdominal pain, no headaches and no shortness of breath. Nothing aggravates the symptoms. Nothing relieves the symptoms. She has tried nothing for the symptoms. The treatment provided no relief.       Prior to Admission medications   Medication Sig Start Date End Date Taking? Authorizing Provider  acetaminophen  (TYLENOL ) 325 MG tablet Take 2 tablets (650 mg total) by mouth every 6 (six) hours as needed. Patient taking differently: Take 650 mg by mouth every 6 (six) hours as needed for mild pain (pain score 1-3) or moderate pain (pain score 4-6). 12/06/21   Elnor Jayson LABOR, DO  amLODipine -benazepril  (LOTREL) 5-20 MG capsule Take 1 capsule by mouth at bedtime. 05/06/24   Dennise Lavada POUR, MD  aspirin  EC 81 MG tablet Take 1 tablet (81 mg total) by mouth daily. Swallow whole. 12/05/22   Lue Elsie BROCKS, MD  atorvastatin  (LIPITOR) 40 MG tablet Take 1 tablet (40 mg total) by mouth daily. Patient taking differently: Take 40 mg by mouth at bedtime. 02/08/21   Whitfield Raisin, NP  Carboxymethylcellulose Sodium (THERATEARS OP) Apply 1 drop to eye 3 (three) times daily.    [provider]  Cholecalciferol (VITAMIN D3) 50 MCG (2000 UT) TABS Take 1 tablet by mouth  daily. 01/01/23   [provider]  diclofenac  Sodium (VOLTAREN ) 1 % GEL Apply 2 g topically 2 (two) times daily.    [provider]  escitalopram  (LEXAPRO ) 5 MG tablet Take 5 mg by mouth at bedtime. 01/26/23   [provider]  insulin  aspart (NOVOLOG ) 100 UNIT/ML FlexPen Before each meal 3 times a day, 140-199 - 2 units, 200-250 - 4 units, 251-299 - 6 units,  300-349 - 8 units,  350 or above 10 units. 05/06/24   Singh, Prashant K, MD  insulin  glargine-yfgn (SEMGLEE ) 100 UNIT/ML Pen Inject 6 Units into the skin at bedtime.    [provider]  lidocaine  (LIDODERM ) 5 % Place 1 patch onto the skin daily. Remove & Discard patch within 12 hours or as directed by MD 04/13/24   Melvenia Motto, MD  meclizine  (ANTIVERT ) 12.5 MG tablet Take 12.5 mg by mouth 3 (three) times daily as needed for dizziness or nausea. 04/22/24   [provider]  Melatonin 3 MG TBDP Take 3 mg by mouth at bedtime.    [provider]  mineral oil-hydrophilic petrolatum (AQUAPHOR) ointment Apply 1 Application topically 2 (two) times daily.    [provider]  Multiple Vitamin (DAILY VITE) TABS Take 1 tablet by mouth daily.    [provider]  propranolol  (INDERAL ) 10 MG tablet Take 10 mg by mouth See admin instructions. Give 1 tablet (10mg )  by mouth twice daily for essential tremor. Hold for HR < 55 or BP < 90/60. 01/05/21   [provider]  QUEtiapine  (SEROQUEL ) 25 MG tablet Take 1 tablet (25 mg total) by mouth at bedtime. 05/06/24   Singh, Prashant K, MD  SENNA-TABS 8.6 MG tablet Take 1 tablet by mouth every other day. 04/09/24   [provider]  Vitamin D , Ergocalciferol , (DRISDOL) 1.25 MG (50000 UNIT) CAPS capsule Take 50,000 Units by mouth once a week. 04/14/24   [provider]    Allergies: Doxycycline hyclate, Levofloxacin, Meloxicam, Naproxen-esomeprazole mg, Nitrofurantoin, Esomeprazole, Naproxen, Other, and Pneumococcal polysaccharide vaccine     Review of Systems  Constitutional:  Negative for fever.  Respiratory:  Negative for shortness of breath.   Cardiovascular:  Negative for chest pain.  Gastrointestinal:  Negative for abdominal pain.  Neurological:  Negative for headaches.    Updated Vital Signs BP (!) 162/119 (BP Location: Right Arm)   Pulse 88   Temp 98.5 F (36.9 C) (Oral)   Resp 18   SpO2 95%   Physical Exam Vitals and nursing note reviewed.  Constitutional:      General: She is not in acute distress.    Appearance: Normal appearance. She is well-developed.  HENT:     Head: Normocephalic and atraumatic.  Eyes:     Conjunctiva/sclera: Conjunctivae normal.  Cardiovascular:     Rate and Rhythm: Normal rate and regular rhythm.     Heart sounds: No murmur heard. Pulmonary:     Effort: Pulmonary effort is normal. No respiratory distress.     Breath sounds: Normal breath sounds. No stridor. No wheezing.  Abdominal:     Palpations: Abdomen is soft.     Tenderness: There is no abdominal tenderness. There is no guarding or rebound.  Musculoskeletal:        General: No tenderness or deformity. Normal range of motion.     Cervical back: Neck supple.  Skin:    General: Skin is warm and dry.  Neurological:     General: No focal deficit present.     Mental Status: She is alert. She is disoriented.     GCS: GCS eye subscore is 4. GCS verbal subscore is 5. GCS motor subscore is 6.     Motor: No weakness.     (all labs ordered are listed, but only abnormal results are displayed) Labs Reviewed  URINALYSIS, ROUTINE W REFLEX MICROSCOPIC - Abnormal; Notable for the following components:      Result Value   Color, Urine STRAW (*)    Glucose, UA >=500 (*)    Ketones, ur 5 (*)    All other components within normal limits  BASIC METABOLIC PANEL WITH GFR - Abnormal; Notable for the following components:   Sodium 133 (*)    Glucose, Bld 335 (*)    All other components within normal limits  CBC WITH  DIFFERENTIAL/PLATELET - Abnormal; Notable for the following components:   MCV 79.3 (*)    MCH 24.8 (*)    RDW 19.2 (*)    All other components within normal limits  URINE CULTURE    EKG: EKG Interpretation Date/Time:  Thursday July 03 2024 07:26:31 EDT Ventricular Rate:  84 PR Interval:  169 QRS Duration:  93 QT Interval:  379 QTC Calculation: 448 R Axis:   62  Text Interpretation: Sinus rhythm Nonspecific T abnormalities, lateral leads Minimal ST elevation, anterior leads No significant change since prior 7/25 Confirmed by Towana Sharper [phone])  on 07/03/2024 7:40:32 AM  Radiology: CT Head Wo Contrast Result Date: 07/03/2024 EXAM: CT HEAD WITHOUT CONTRAST 07/03/2024 08:27:44 AM TECHNIQUE: CT of the head was performed without the administration of intravenous contrast. Automated exposure control, iterative reconstruction, and/or weight based adjustment of the mA/kV was utilized to reduce the radiation dose to as low as reasonably achievable. COMPARISON: 04/30/2024 CLINICAL HISTORY: Head trauma, minor (Age >= 65y). PT had an unwitnessed fall. According to PT she tripped and hit her arm against the heater. PT denies, LOC, hitting her head, N/V, Dizziness FINDINGS: BRAIN AND VENTRICLES: Hypoattenuating foci in the cerebral white matter, most likely representing chronic small vessel disease. Generalized atrophy. No acute hemorrhage. No evidence of acute infarct. No hydrocephalus. No extra-axial collection. No mass effect or midline shift. ORBITS: No acute abnormality. SINUSES: Mucosal thickening of the frontal sinus and partial opacification of the left anterior ethmoid air cells. SOFT TISSUES AND SKULL: No acute soft tissue abnormality. No skull fracture. IMPRESSION: 1. No acute intracranial abnormality related to the reported head trauma. 2. Paranasal sinus disease with mucosal thickening of the frontal sinus and partial opacification of the left anterior ethmoid air cells. Electronically  signed by: Waddell Calk MD 07/03/2024 08:36 AM EDT RP Workstation: [id number]     Procedures   Medications Ordered in the ED - No data to display  Clinical Course as of 07/03/24 1707  Thu Jul 03, 2024  0924 I reached out to her son [name] who is comfortable with her going back to facility. [MB]    Clinical Course User Index [MB] Towana Ozell BROCKS, MD                                 Medical Decision Making Amount and/or Complexity of Data Reviewed Labs: ordered. Radiology: ordered.   This patient complains of fall possible confusion; this involves an extensive number of treatment Options and is a complaint that carries with it a high risk of complications and morbidity. The differential includes dementia, UTI, infection, head bleed  I ordered, reviewed and interpreted labs, which included CBC normal chemistries with elevated blood glucose urinalysis without signs of infection I ordered imaging studies which included head CT and I independently    visualized and interpreted imaging which showed no acute findings Additional history obtained from EMS and patient's son Previous records obtained and reviewed in epic, multiple ED visits for falls Cardiac monitoring reviewed, sinus rhythm Social determinants considered, social isolation Critical Interventions: None  After the interventions stated above, I reevaluated the patient and found patient to be awake, in no distress Admission and further testing considered, she will be returned back to her facility where they can continue to observe her.      Final diagnoses:  Fall, initial encounter  Hyperglycemia    ED Discharge Orders     None          Towana Ozell BROCKS, MD 07/03/24 [phone]

## 2024-07-04 LAB — URINE CULTURE: Culture: NO GROWTH

## 2024-07-19 ENCOUNTER — Other Ambulatory Visit: Payer: Self-pay

## 2024-07-19 ENCOUNTER — Emergency Department (HOSPITAL_COMMUNITY)

## 2024-07-19 ENCOUNTER — Encounter (HOSPITAL_COMMUNITY): Payer: Self-pay

## 2024-07-19 ENCOUNTER — Emergency Department (HOSPITAL_COMMUNITY)
Admission: EM | Admit: 2024-07-19 | Discharge: 2024-07-20 | Disposition: A | Discharge status: Home / Self Care | Attending: Emergency Medicine | Admitting: Emergency Medicine

## 2024-07-19 DIAGNOSIS — S51011A Laceration without foreign body of right elbow, initial encounter: Secondary | ICD-10-CM | POA: Diagnosis not present

## 2024-07-19 DIAGNOSIS — W010XXA Fall on same level from slipping, tripping and stumbling without subsequent striking against object, initial encounter: Secondary | ICD-10-CM | POA: Insufficient documentation

## 2024-07-19 DIAGNOSIS — Z8673 Personal history of transient ischemic attack (TIA), and cerebral infarction without residual deficits: Secondary | ICD-10-CM | POA: Insufficient documentation

## 2024-07-19 DIAGNOSIS — E119 Type 2 diabetes mellitus without complications: Secondary | ICD-10-CM | POA: Insufficient documentation

## 2024-07-19 DIAGNOSIS — I10 Essential (primary) hypertension: Secondary | ICD-10-CM | POA: Insufficient documentation

## 2024-07-19 DIAGNOSIS — W19XXXA Unspecified fall, initial encounter: Secondary | ICD-10-CM

## 2024-07-19 DIAGNOSIS — T07XXXA Unspecified multiple injuries, initial encounter: Secondary | ICD-10-CM

## 2024-07-19 DIAGNOSIS — S0990XA Unspecified injury of head, initial encounter: Secondary | ICD-10-CM | POA: Insufficient documentation

## 2024-07-19 DIAGNOSIS — S51811A Laceration without foreign body of right forearm, initial encounter: Secondary | ICD-10-CM | POA: Insufficient documentation

## 2024-07-19 DIAGNOSIS — S50811A Abrasion of right forearm, initial encounter: Secondary | ICD-10-CM | POA: Diagnosis present

## 2024-07-19 NOTE — ED Triage Notes (Signed)
 Patient is coming from [facility] . Patient had an unwitnessed fall tonight. Patient reports hit head but does not remember why she fell. No obvious head injury at the present. Presenting with skin tears on the upper right arm. Patient is not on thinners. Patient is denying any pain and LOC at this time.  EMS VS 138/62 BP 80 HR 18 RR 98% RA 240 CBG

## 2024-07-19 NOTE — ED Notes (Signed)
 Pt is an Social worker patient. Hospice nurse asked to be contacted if needed [phone]

## 2024-07-19 NOTE — ED Provider Notes (Signed)
 Emergency Department Provider Note   I have reviewed the triage vital signs and the nursing notes.   HISTORY  Chief Complaint Fall   HPI Kiara Blair is a 89 y.o. female with PMH of DM, HTN, and HLD presents to the emergency department for evaluation after fall.  She is a resident at [facility] .  Patient states that she tripped and fell with some abrasions to the right arm.  She has no pain at this time.  No loss of consciousness.   Past Medical History:  Diagnosis Date   Diabetes mellitus without complication (HCC)    Hypercholesteremia    Hypertension    TIA (transient ischemic attack) 01/03/2021   Tremor    right arm    Review of Systems  Constitutional: No fever/chills Cardiovascular: Denies chest pain. Respiratory: Denies shortness of breath. Gastrointestinal: No abdominal pain.  No nausea, no vomiting.  Musculoskeletal: Negative for back pain. Skin: Positive abrasions to the right arm.  Neurological: Positive HA.   ____________________________________________   PHYSICAL EXAM:  VITAL SIGNS: ED Triage Vitals  Encounter Vitals Group     BP 07/19/24 2255 (!) 152/63     Pulse Rate 07/19/24 2255 66     Resp 07/19/24 2255 16     Temp --      Temp src --      SpO2 07/19/24 2256 95 %   Constitutional: Alert and oriented. Well appearing and in no acute distress. Eyes: Conjunctivae are normal. PERRL. EOMI. Head: Atraumatic. Nose: No congestion/rhinnorhea. Mouth/Throat: Mucous membranes are moist.   Neck: No stridor. C collar in place.  Cardiovascular: Normal rate, regular rhythm. Good peripheral circulation. Grossly normal heart sounds.   Respiratory: Normal respiratory effort.  No retractions. Lungs CTAB. Gastrointestinal: Soft and nontender. No distention.  Musculoskeletal: No lower extremity tenderness nor edema. No gross deformities of extremities.  Normal range of motion of the bilateral upper and lower extremities. Neurologic:  Normal speech and  language. No gross focal neurologic deficits are appreciated.  Skin:  Skin is warm, dry.  Several skin tears to the right forearm and elbow. No deeper lacerations.   ____________________________________________  RADIOLOGY  No results found.  ____________________________________________   PROCEDURES  Procedure(s) performed:   Procedures  None  ____________________________________________   INITIAL IMPRESSION / ASSESSMENT AND PLAN / ED COURSE  Pertinent labs & imaging results that were available during my care of the patient were reviewed by me and considered in my medical decision making (see chart for details).   This patient is Presenting for Evaluation of fall, which does require a range of treatment options, and is a complaint that involves a high risk of morbidity and mortality.  The Differential Diagnoses includes subdural hematoma, epidural hematoma, acute concussion, traumatic subarachnoid hemorrhage, cerebral contusions, etc.   Radiologic Tests Ordered, included CT head and c spine. I independently interpreted the images and agree with radiology interpretation.   Medical Decision Making: Summary:  Patient presents to the emergency department after mechanical fall.  She has some skin tearing to the right arm but normal range of motion.  Soft compartments.  No bony tenderness.  Plan for CT imaging of the head and cervical spine.  Patient declines Tylenol  for pain as she tells me that she has no pain currently.   Patient's presentation is most consistent with acute, uncomplicated illness.   Disposition:   ____________________________________________  FINAL CLINICAL IMPRESSION(S) / ED DIAGNOSES  Final diagnoses:  None     NEW OUTPATIENT MEDICATIONS  STARTED DURING THIS VISIT:  New Prescriptions   No medications on file    Note:  This document was prepared using Dragon voice recognition software and may include unintentional dictation errors.  Fonda Law,  MD, [facility]

## 2024-07-19 NOTE — ED Notes (Signed)
 Patient returned from CT

## 2024-07-20 NOTE — ED Notes (Signed)
PTAR here to get patient  ?

## 2024-07-20 NOTE — Discharge Instructions (Signed)
 Your CT scan did not show any bleeding or broken bones.  Please keep your abrasions clean and dry.  You should cover with a nonstick dressing for the next 5 days. These will heal on their own. You can clean gently with warm water and soap.

## 2024-07-20 NOTE — ED Notes (Signed)
 Patient was asking for assistance with bedpan, assisted patient was incont of small amt of stool, patient was cleaned and new linens applied to stretcher. Patient is alert,

## 2024-09-05 ENCOUNTER — Emergency Department (HOSPITAL_COMMUNITY)

## 2024-09-05 ENCOUNTER — Inpatient Hospital Stay (HOSPITAL_COMMUNITY)
Admission: EM | Admit: 2024-09-05 | Discharge: 2024-09-08 | DRG: 637 | Disposition: A | Discharge status: Skilled Nursing Facility | Source: Skilled Nursing Facility | Attending: Internal Medicine | Admitting: Internal Medicine

## 2024-09-05 DIAGNOSIS — Z794 Long term (current) use of insulin: Secondary | ICD-10-CM

## 2024-09-05 DIAGNOSIS — G250 Essential tremor: Secondary | ICD-10-CM | POA: Diagnosis present

## 2024-09-05 DIAGNOSIS — Z886 Allergy status to analgesic agent status: Secondary | ICD-10-CM

## 2024-09-05 DIAGNOSIS — G309 Alzheimer's disease, unspecified: Secondary | ICD-10-CM | POA: Diagnosis present

## 2024-09-05 DIAGNOSIS — E7800 Pure hypercholesterolemia, unspecified: Secondary | ICD-10-CM | POA: Diagnosis present

## 2024-09-05 DIAGNOSIS — Z881 Allergy status to other antibiotic agents status: Secondary | ICD-10-CM

## 2024-09-05 DIAGNOSIS — E162 Hypoglycemia, unspecified: Secondary | ICD-10-CM

## 2024-09-05 DIAGNOSIS — G9341 Metabolic encephalopathy: Secondary | ICD-10-CM | POA: Diagnosis present

## 2024-09-05 DIAGNOSIS — Z66 Do not resuscitate: Secondary | ICD-10-CM | POA: Diagnosis present

## 2024-09-05 DIAGNOSIS — Z887 Allergy status to serum and vaccine status: Secondary | ICD-10-CM

## 2024-09-05 DIAGNOSIS — Z90710 Acquired absence of both cervix and uterus: Secondary | ICD-10-CM

## 2024-09-05 DIAGNOSIS — Z8249 Family history of ischemic heart disease and other diseases of the circulatory system: Secondary | ICD-10-CM

## 2024-09-05 DIAGNOSIS — Z8673 Personal history of transient ischemic attack (TIA), and cerebral infarction without residual deficits: Secondary | ICD-10-CM

## 2024-09-05 DIAGNOSIS — Z79899 Other long term (current) drug therapy: Secondary | ICD-10-CM

## 2024-09-05 DIAGNOSIS — N390 Urinary tract infection, site not specified: Secondary | ICD-10-CM | POA: Diagnosis present

## 2024-09-05 DIAGNOSIS — Z515 Encounter for palliative care: Secondary | ICD-10-CM

## 2024-09-05 DIAGNOSIS — F05 Delirium due to known physiological condition: Secondary | ICD-10-CM | POA: Diagnosis present

## 2024-09-05 DIAGNOSIS — I959 Hypotension, unspecified: Principal | ICD-10-CM | POA: Diagnosis present

## 2024-09-05 DIAGNOSIS — G9340 Encephalopathy, unspecified: Secondary | ICD-10-CM | POA: Diagnosis present

## 2024-09-05 DIAGNOSIS — R680 Hypothermia, not associated with low environmental temperature: Secondary | ICD-10-CM | POA: Diagnosis present

## 2024-09-05 DIAGNOSIS — Z7982 Long term (current) use of aspirin: Secondary | ICD-10-CM

## 2024-09-05 DIAGNOSIS — Z888 Allergy status to other drugs, medicaments and biological substances status: Secondary | ICD-10-CM

## 2024-09-05 DIAGNOSIS — I10 Essential (primary) hypertension: Secondary | ICD-10-CM | POA: Diagnosis present

## 2024-09-05 DIAGNOSIS — R296 Repeated falls: Secondary | ICD-10-CM | POA: Diagnosis present

## 2024-09-05 DIAGNOSIS — E1165 Type 2 diabetes mellitus with hyperglycemia: Secondary | ICD-10-CM | POA: Diagnosis present

## 2024-09-05 DIAGNOSIS — F0283 Dementia in other diseases classified elsewhere, unspecified severity, with mood disturbance: Secondary | ICD-10-CM | POA: Diagnosis present

## 2024-09-05 DIAGNOSIS — T68XXXA Hypothermia, initial encounter: Secondary | ICD-10-CM

## 2024-09-05 DIAGNOSIS — E11649 Type 2 diabetes mellitus with hypoglycemia without coma: Principal | ICD-10-CM | POA: Diagnosis present

## 2024-09-05 LAB — CBC WITH DIFFERENTIAL/PLATELET
Abs Immature Granulocytes: 0.07 K/uL (ref 0.00–0.07)
Basophils Absolute: 0 K/uL (ref 0.0–0.1)
Basophils Relative: 0 %
Eosinophils Absolute: 0.1 K/uL (ref 0.0–0.5)
Eosinophils Relative: 1 %
HCT: 32.4 % — ABNORMAL LOW (ref 36.0–46.0)
Hemoglobin: 10.4 g/dL — ABNORMAL LOW (ref 12.0–15.0)
Immature Granulocytes: 1 %
Lymphocytes Relative: 23 %
Lymphs Abs: 3.1 K/uL (ref 0.7–4.0)
MCH: 27.7 pg (ref 26.0–34.0)
MCHC: 32.1 g/dL (ref 30.0–36.0)
MCV: 86.2 fL (ref 80.0–100.0)
Monocytes Absolute: 0.9 K/uL (ref 0.1–1.0)
Monocytes Relative: 7 %
Neutro Abs: 9.1 K/uL — ABNORMAL HIGH (ref 1.7–7.7)
Neutrophils Relative %: 68 %
Platelets: 264 K/uL (ref 150–400)
RBC: 3.76 MIL/uL — ABNORMAL LOW (ref 3.87–5.11)
RDW: 17.5 % — ABNORMAL HIGH (ref 11.5–15.5)
WBC: 13.3 K/uL — ABNORMAL HIGH (ref 4.0–10.5)
nRBC: 0 % (ref 0.0–0.2)

## 2024-09-05 LAB — GLUCOSE, CAPILLARY
Glucose-Capillary: 227 mg/dL — ABNORMAL HIGH (ref 70–99)
Glucose-Capillary: 92 mg/dL (ref 70–99)
Glucose-Capillary: 215 mg/dL — ABNORMAL HIGH (ref 70–99)

## 2024-09-05 LAB — I-STAT CG4 LACTIC ACID, ED
Lactic Acid, Venous: 2.6 mmol/L (ref 0.5–1.9)
Lactic Acid, Venous: 1.9 mmol/L (ref 0.5–1.9)

## 2024-09-05 LAB — URINALYSIS, ROUTINE W REFLEX MICROSCOPIC
Bilirubin Urine: NEGATIVE
Glucose, UA: >=500 mg/dL — AB
Hgb urine dipstick: NEGATIVE
Ketones, ur: NEGATIVE mg/dL
Nitrite: NEGATIVE
Protein, ur: 30 mg/dL — AB
Specific Gravity, Urine: 1.008 (ref 1.005–1.030)
pH: 7 (ref 5.0–8.0)

## 2024-09-05 LAB — COMPREHENSIVE METABOLIC PANEL WITH GFR
ALT: 21 U/L (ref 0–44)
AST: 36 U/L (ref 15–41)
Albumin: 2.8 g/dL — ABNORMAL LOW (ref 3.5–5.0)
Alkaline Phosphatase: 124 U/L (ref 38–126)
Anion gap: 7 (ref 5–15)
BUN: 24 mg/dL — ABNORMAL HIGH (ref 8–23)
CO2: 24 mmol/L (ref 22–32)
Calcium: 8.4 mg/dL — ABNORMAL LOW (ref 8.9–10.3)
Chloride: 101 mmol/L (ref 98–111)
Creatinine, Ser: 0.91 mg/dL (ref 0.44–1.00)
GFR, Estimated: 59 mL/min — ABNORMAL LOW (ref >60)
Glucose, Bld: 429 mg/dL — ABNORMAL HIGH (ref 70–99)
Potassium: 3.7 mmol/L (ref 3.5–5.1)
Sodium: 132 mmol/L — ABNORMAL LOW (ref 135–145)
Total Bilirubin: 0.4 mg/dL (ref 0.0–1.2)
Total Protein: 5.1 g/dL — ABNORMAL LOW (ref 6.5–8.1)

## 2024-09-05 LAB — CBG MONITORING, ED
Glucose-Capillary: 65 mg/dL — ABNORMAL LOW (ref 70–99)
Glucose-Capillary: 488 mg/dL — ABNORMAL HIGH (ref 70–99)
Glucose-Capillary: 454 mg/dL — ABNORMAL HIGH (ref 70–99)
Glucose-Capillary: 293 mg/dL — ABNORMAL HIGH (ref 70–99)

## 2024-09-05 LAB — TROPONIN I (HIGH SENSITIVITY)
Troponin I (High Sensitivity): 8 ng/L (ref <18)
Troponin I (High Sensitivity): 8 ng/L (ref <18)

## 2024-09-05 MED ORDER — ESCITALOPRAM OXALATE 10 MG PO TABS
5.0000 mg | ORAL_TABLET | Freq: Every day | ORAL | Status: DC
  Administered 2024-09-05 – 2024-09-07 (×3): 5 mg via ORAL
  Filled 2024-09-05 (×3): qty 1

## 2024-09-05 MED ORDER — DEXTROSE 10 % IV SOLN: INTRAVENOUS | Status: DC

## 2024-09-05 MED ORDER — MELATONIN 5 MG PO TABS
5.0000 mg | ORAL_TABLET | Freq: Every day | ORAL | Status: DC
  Administered 2024-09-05 – 2024-09-07 (×3): 5 mg via ORAL
  Filled 2024-09-05 (×3): qty 1

## 2024-09-05 MED ORDER — ASPIRIN 81 MG PO TBEC
81.0000 mg | DELAYED_RELEASE_TABLET | Freq: Every day | ORAL | Status: DC
  Administered 2024-09-06 – 2024-09-08 (×3): 81 mg via ORAL
  Filled 2024-09-05 (×3): qty 1

## 2024-09-05 MED ORDER — DEXTROSE 50 % IV SOLN
INTRAVENOUS | Status: AC
  Administered 2024-09-05: 50 mL
  Filled 2024-09-05: qty 50

## 2024-09-05 MED ORDER — EPINEPHRINE HCL 5 MG/250ML IV SOLN IN NS
0.5000 ug/min | INTRAVENOUS | Status: DC
  Administered 2024-09-05: 0.5 ug/min via INTRAVENOUS
  Filled 2024-09-05: qty 250

## 2024-09-05 MED ORDER — HYDROXYZINE HCL 25 MG PO TABS
25.0000 mg | ORAL_TABLET | Freq: Once | ORAL | Status: AC
  Administered 2024-09-05: 25 mg via ORAL
  Filled 2024-09-05: qty 1

## 2024-09-05 MED ORDER — SODIUM CHLORIDE 0.9 % IV SOLN
2.0000 g | Freq: Two times a day (BID) | INTRAVENOUS | Status: DC
  Administered 2024-09-05 – 2024-09-06 (×2): 2 g via INTRAVENOUS
  Filled 2024-09-05 (×2): qty 12.5

## 2024-09-05 MED ORDER — ACETAMINOPHEN 650 MG RE SUPP: 650.0000 mg | Freq: Four times a day (QID) | RECTAL | Status: DC | PRN

## 2024-09-05 MED ORDER — ACETAMINOPHEN 325 MG PO TABS
650.0000 mg | ORAL_TABLET | Freq: Four times a day (QID) | ORAL | Status: DC | PRN
  Filled 2024-09-05: qty 2

## 2024-09-05 MED ORDER — SODIUM CHLORIDE 0.9 % IV SOLN
2.0000 g | Freq: Once | INTRAVENOUS | Status: AC
  Administered 2024-09-05: 2 g via INTRAVENOUS
  Filled 2024-09-05: qty 12.5

## 2024-09-05 MED ORDER — INSULIN ASPART 100 UNIT/ML IJ SOLN
0.0000 [IU] | Freq: Three times a day (TID) | INTRAMUSCULAR | Status: DC
  Administered 2024-09-05: 5 [IU] via SUBCUTANEOUS
  Administered 2024-09-06: 8 [IU] via SUBCUTANEOUS
  Administered 2024-09-06: 5 [IU] via SUBCUTANEOUS
  Filled 2024-09-05 (×2): qty 5
  Filled 2024-09-05: qty 8

## 2024-09-05 MED ORDER — QUETIAPINE FUMARATE 25 MG PO TABS
25.0000 mg | ORAL_TABLET | Freq: Two times a day (BID) | ORAL | Status: DC | PRN
  Administered 2024-09-05 – 2024-09-08 (×6): 25 mg via ORAL
  Filled 2024-09-05 (×6): qty 1

## 2024-09-05 MED ORDER — ATORVASTATIN CALCIUM 40 MG PO TABS
40.0000 mg | ORAL_TABLET | Freq: Every day | ORAL | Status: DC
  Administered 2024-09-06 – 2024-09-08 (×3): 40 mg via ORAL
  Filled 2024-09-05 (×3): qty 1

## 2024-09-05 MED ORDER — SODIUM CHLORIDE 0.9 % IV SOLN: INTRAVENOUS | Status: AC

## 2024-09-05 MED ORDER — SENNOSIDES-DOCUSATE SODIUM 8.6-50 MG PO TABS
2.0000 | ORAL_TABLET | Freq: Two times a day (BID) | ORAL | Status: DC
  Administered 2024-09-05 – 2024-09-08 (×6): 2 via ORAL
  Filled 2024-09-05 (×6): qty 2

## 2024-09-05 MED ORDER — VANCOMYCIN HCL IN DEXTROSE 1-5 GM/200ML-% IV SOLN
1000.0000 mg | Freq: Once | INTRAVENOUS | Status: AC
  Administered 2024-09-05: 1000 mg via INTRAVENOUS
  Filled 2024-09-05: qty 200

## 2024-09-05 MED ORDER — LACTATED RINGERS IV BOLUS
1000.0000 mL | Freq: Once | INTRAVENOUS | Status: AC
  Administered 2024-09-05: 1000 mL via INTRAVENOUS

## 2024-09-05 NOTE — Plan of Care (Signed)
  Problem: Metabolic: Goal: Ability to maintain appropriate glucose levels will improve Outcome: Progressing   Problem: Clinical Measurements: Goal: Will remain free from infection Outcome: Progressing Goal: Diagnostic test results will improve Outcome: Progressing   Problem: Coping: Goal: Level of anxiety will decrease Outcome: Progressing   Problem: Safety: Goal: Ability to remain free from injury will improve Outcome: Progressing

## 2024-09-05 NOTE — Progress Notes (Addendum)
 Kiara Blair RM 4T76 [facility] Liaison Note:   Kiara Blair is a current hospice patient with [facility] with a terminal diagnosis of Cerebrovascular Disease.  Patient was transported to ED from ALF where she was found to have increased confusion was hypoglycemic.  Patient is admitted under observation status. Per Dr. Norleen Sella with [facility] this is a related hospital admission. Patient is a DNR.   Patient was noted to be hypotensive and arrived to ED on epi drip. Patients blood glucose improved with D50 and D10 in ED.  No significant improvement to mental status. Presummed to be septic with origin likley urinary, started on IVAB. Bedside visit, no family present.  Patient awake, disoriented.  Unable to answer questions.     Per chart review, patient remains GIP appropriate as she requires IV antibiotics.    Vital Signs: 147/124 HR 72 RR 15 T 98.2 SpO2 100%    I/O: not recorded   Abnormal Labs: Blood Glucose 454 Sodium 132 BUN 24 Calcium  8.4 Total Protein 5.1 Albumin 2.8 GFR 59 WBC 13.3 RBC 3.76 Hemoglobin 10.4 HCT 32.4    Diagnostics:  CT Head WO Contrast: IMPRESSION: 1. Chronic atrophic and ischemic changes without acute abnormality. 2. Mild right parietal scalp swelling consistent with recent trauma. CT Maxillofacial WO Contrast:  IMPRESSION: 1. No acute facial fracture. 2. No soft tissue abnormality. Chest Xray 1 view  IMPRESSION: 1. No acute cardiopulmonary process with interval clearing at the left lung base. IV/PRN Meds: dextrose  50% 1 amp/24ml x 1, eFEPIme (MAXIPIME) 2 g in sodium chloride  0.9 % 100 mL IVPBc q12 hr, dextrose  10 % infusion started at 0215 dc'd at 0449, EPINEPHrine  (ADRENALIN ) 5 mg in NS 250 mL (0.02 mg/mL) premix infusion started at 012 dc'd at 0646, lactated ringers  bolus 1,000 mL x 1, vancomycin (VANCOCIN) IVPB 1000 mg/200 mL premix x 1   Assessment and Plan per Marsha Ada, MD  Acute encephalopathy c/b  hypotension iso sepsis of presumed urinary origin S/p IV vancomycin and cefepime in ED; UA notable for pyria and bacteruria; WBC 13.3 and lactic acid 2.6 -SLP consulted; apprec eval/recs (ataxic dysarthria noted w/ recs for Neuro consult; will defer for now pending ongoing assessment; NPO w/ nectar thick liquids) -IV cefepime per pharmacy protocol for now -MIVF: NS at 75cc/h for now -F/u urine and blodd cultures; titrate abx accordingly -F/u serial lactic acid until wnl per sepsis protocol   HTN -HOLD pta Lotrel 5-20mg  for now iso hypotension above   DM2 -HOLD pta Semglee  6U nightly for now -SSI TID prn -Mod Carb diet   Prior TIA -PTA ASA and statin   Mood disorder -PTA Lexapro      Advance Care Planning:   Code Status: Limited: Do not attempt resuscitation (DNR) -DNR-LIMITED -Do Not Intubate/DNI     Consults: N/A     Code Status: DNR   Discharge Planning: ongoing Family Contact:  son, [name] [phone] IDT: updated Goals of Care: DNR   Please call with any hospice related questions or concerns.  Daphne Shed, LPN [facility] Liaison [phone]

## 2024-09-05 NOTE — ED Provider Notes (Addendum)
 Emergency Department Provider Note  TRIAGE NOTE: Pt coming from nursing home, pt was found unresponsive with glucose in the 40s, HR in the 40s. En route pt started on Epi drip for HR, NS running. Atropine given by EMS  HISTORY  Chief Complaint No chief complaint on file.   HPI Kiara Blair is a 89 y.o. female with  a history of Alzheimer's dementia who presents with altered mental status. The symptoms began when she was found unresponsive with a blood sugar level of 44. After administration of sugar by mouth, her blood sugar increased to 320, but she remained sluggish. She also has a slight head injury, which is new according to her usual caregiver. Her mental status has improved but remains sluggish. She has a history of receiving hospice care. History was obtained from EMS and the patient's caregiver.  PMH Past Medical History:  Diagnosis Date   Diabetes mellitus without complication (HCC)    Hypercholesteremia    Hypertension    TIA (transient ischemic attack) 01/03/2021   Tremor    right arm    Home Medications Prior to Admission medications   Medication Sig Start Date End Date Taking? Authorizing Provider  acetaminophen  (TYLENOL ) 325 MG tablet Take 2 tablets (650 mg total) by mouth every 6 (six) hours as needed. Patient taking differently: Take 650 mg by mouth every 6 (six) hours as needed for mild pain (pain score 1-3) or moderate pain (pain score 4-6). 12/06/21   Elnor Jayson LABOR, DO  amLODipine -benazepril  (LOTREL) 5-20 MG capsule Take 1 capsule by mouth at bedtime. 05/06/24   Dennise Lavada POUR, MD  aspirin  EC 81 MG tablet Take 1 tablet (81 mg total) by mouth daily. Swallow whole. 12/05/22   Lue Elsie BROCKS, MD  atorvastatin  (LIPITOR) 40 MG tablet Take 1 tablet (40 mg total) by mouth daily. Patient taking differently: Take 40 mg by mouth at bedtime. 02/08/21   Whitfield Raisin, NP  Carboxymethylcellulose Sodium (THERATEARS OP) Apply 1 drop to eye 3 (three) times daily.     [provider]  Cholecalciferol (VITAMIN D3) 50 MCG (2000 UT) TABS Take 1 tablet by mouth daily. 01/01/23   [provider]  diclofenac  Sodium (VOLTAREN ) 1 % GEL Apply 2 g topically 2 (two) times daily.    [provider]  escitalopram  (LEXAPRO ) 5 MG tablet Take 5 mg by mouth at bedtime. 01/26/23   [provider]  insulin  aspart (NOVOLOG ) 100 UNIT/ML FlexPen Before each meal 3 times a day, 140-199 - 2 units, 200-250 - 4 units, 251-299 - 6 units,  300-349 - 8 units,  350 or above 10 units. 05/06/24   Singh, Prashant K, MD  insulin  glargine-yfgn (SEMGLEE ) 100 UNIT/ML Pen Inject 6 Units into the skin at bedtime.    [provider]  lidocaine  (LIDODERM ) 5 % Place 1 patch onto the skin daily. Remove & Discard patch within 12 hours or as directed by MD 04/13/24   Melvenia Motto, MD  meclizine  (ANTIVERT ) 12.5 MG tablet Take 12.5 mg by mouth 3 (three) times daily as needed for dizziness or nausea. 04/22/24   [provider]  Melatonin 3 MG TBDP Take 3 mg by mouth at bedtime.    [provider]  mineral oil-hydrophilic petrolatum (AQUAPHOR) ointment Apply 1 Application topically 2 (two) times daily.    [provider]  Multiple Vitamin (DAILY VITE) TABS Take 1 tablet by mouth daily.    [provider]  propranolol  (INDERAL ) 10 MG tablet Take 10  mg by mouth See admin instructions. Give 1 tablet (10mg ) by mouth twice daily for essential tremor. Hold for HR < 55 or BP < 90/60. 01/05/21   [provider]  QUEtiapine  (SEROQUEL ) 25 MG tablet Take 1 tablet (25 mg total) by mouth at bedtime. 05/06/24   Singh, Prashant K, MD  SENNA-TABS 8.6 MG tablet Take 1 tablet by mouth every other day. 04/09/24   [provider]  Vitamin D , Ergocalciferol , (DRISDOL) 1.25 MG (50000 UNIT) CAPS capsule Take 50,000 Units by mouth once a week. 04/14/24   [provider]    Social History Social History   Tobacco Use   Smoking status:  Never   Smokeless tobacco: Never  Vaping Use   Vaping status: Never Used  Substance Use Topics   Alcohol  use: No   Drug use: No    Review of Systems: Documented in HPI ____________________________________________  PHYSICAL EXAM: VITAL SIGNS: Triage: Blood pressure (!) 110/52, pulse 74, temperature (!) 94.2 F (34.6 C), temperature source Rectal, resp. rate (!) 27, SpO2 100%.  Vitals:   09/05/24 0345 09/05/24 0502 09/05/24 0530 09/05/24 0535  BP: (!) 115/42  (!) 110/52   Pulse: 70  74   Resp: (!) 22   (!) 27  Temp:  (!) 94.2 F (34.6 C)    TempSrc:  Rectal    SpO2: 100%  100%     Physical Exam Vitals and nursing note reviewed.  Constitutional:      Appearance: She is well-developed.  HENT:     Head: Normocephalic.     Comments: Ecchymosis to chin and right upper eyebrow Cardiovascular:     Rate and Rhythm: Normal rate and regular rhythm.  Pulmonary:     Effort: No respiratory distress.     Breath sounds: No stridor.  Abdominal:     General: There is no distension.  Musculoskeletal:     Cervical back: Normal range of motion.  Neurological:     Mental Status: She is alert.       ____________________________________________   LABS (all labs ordered are listed, but only abnormal results are displayed)  Labs Reviewed  CBC WITH DIFFERENTIAL/PLATELET - Abnormal; Notable for the following components:      Result Value   WBC 13.3 (*)    RBC 3.76 (*)    Hemoglobin 10.4 (*)    HCT 32.4 (*)    RDW 17.5 (*)    Neutro Abs 9.1 (*)    All other components within normal limits  COMPREHENSIVE METABOLIC PANEL WITH GFR - Abnormal; Notable for the following components:   Sodium 132 (*)    Glucose, Bld 429 (*)    BUN 24 (*)    Calcium  8.4 (*)    Total Protein 5.1 (*)    Albumin 2.8 (*)    GFR, Estimated 59 (*)    All other components within normal limits  URINALYSIS, ROUTINE W REFLEX MICROSCOPIC - Abnormal; Notable for the following components:   Color, Urine STRAW  (*)    Glucose, UA >=500 (*)    Protein, ur 30 (*)    Leukocytes,Ua SMALL (*)    Bacteria, UA RARE (*)    All other components within normal limits  CBG MONITORING, ED - Abnormal; Notable for the following components:   Glucose-Capillary 65 (*)    All other components within normal limits  CBG MONITORING, ED - Abnormal; Notable for the following components:   Glucose-Capillary 488 (*)    All other components within  normal limits  CBG MONITORING, ED - Abnormal; Notable for the following components:   Glucose-Capillary 454 (*)    All other components within normal limits  CULTURE, BLOOD (ROUTINE X 2)  CULTURE, BLOOD (ROUTINE X 2)  I-STAT CG4 LACTIC ACID, ED  TROPONIN I (HIGH SENSITIVITY)  TROPONIN I (HIGH SENSITIVITY)   ____________________________________________  EKG   EKG Interpretation Date/Time:    Ventricular Rate:    PR Interval:    QRS Duration:    QT Interval:    QTC Calculation:   R Axis:      Text Interpretation:          ____________________________________________  RADIOLOGY  DG Chest Portable 1 View Result Date: 09/05/2024 EXAM: 1 VIEW(S) XRAY OF THE CHEST 09/05/2024 02:22:49 AM COMPARISON: 05/04/2024 CLINICAL HISTORY: Recent fall. FINDINGS: LUNGS AND PLEURA: Interval clearing in the left base is noted. No pleural effusion. No pneumothorax. HEART AND MEDIASTINUM: Cardiac shadow is stable. Aortic calcifications are seen. BONES AND SOFT TISSUES: No acute osseous abnormality. IMPRESSION: 1. No acute cardiopulmonary process with interval clearing at the left lung base. Electronically signed by: Oneil Devonshire MD 09/05/2024 02:27 AM EST RP Workstation: [id number]   CT Maxillofacial Wo Contrast Result Date: 09/05/2024 EXAM: CT OF THE FACE WITHOUT CONTRAST 09/05/2024 02:18:00 AM TECHNIQUE: CT of the face was performed without the administration of intravenous contrast. Multiplanar reformatted images are provided for review. Automated exposure control, iterative  reconstruction, and/or weight based adjustment of the mA/kV was utilized to reduce the radiation dose to as low as reasonably achievable. COMPARISON: None available. CLINICAL HISTORY: Recent fall with facial pain. FINDINGS: FACIAL BONES: No acute facial fracture. No mandibular dislocation. No suspicious bone lesion. ORBITS: Globes are intact. No acute traumatic injury. No inflammatory change. SINUSES AND MASTOIDS: No acute abnormality. SOFT TISSUES: No acute abnormality. IMPRESSION: 1. No acute facial fracture. 2. No soft tissue abnormality. Electronically signed by: Oneil Devonshire MD 09/05/2024 02:26 AM EST RP Workstation: [id number]   CT Head Wo Contrast Result Date: 09/05/2024 EXAM: CT HEAD WITHOUT CONTRAST 09/05/2024 02:18:00 AM TECHNIQUE: CT of the head was performed without the administration of intravenous contrast. Automated exposure control, iterative reconstruction, and/or weight based adjustment of the mA/kV was utilized to reduce the radiation dose to as low as reasonably achievable. COMPARISON: None available. CLINICAL HISTORY: Recent fall with headaches FINDINGS: BRAIN AND VENTRICLES: No acute hemorrhage. No evidence of acute infarct. No mass effect or midline shift. Chronic atrophic changes and white matter ischemic changes are noted. ORBITS: No acute abnormality. SINUSES: No acute abnormality. SOFT TISSUES AND SKULL: Mild scalp swelling is noted on the right in the parietal region. No skull fracture. IMPRESSION: 1. Chronic atrophic and ischemic changes without acute abnormality. 2. Mild right parietal scalp swelling consistent with recent trauma. Electronically signed by: Oneil Devonshire MD 09/05/2024 02:23 AM EST RP Workstation: [id number]   ____________________________________________  PROCEDURES  Procedure(s) performed:   .Critical Care  Performed by: Lorette Mayo, MD Authorized by: Lorette Mayo, MD   Critical care provider statement:    Critical care time (minutes):  80   Critical  care was necessary to treat or prevent imminent or life-threatening deterioration of the following conditions:  Cardiac failure, circulatory failure, shock, respiratory failure and CNS failure or compromise (Goals of care discussions)   Critical care was time spent personally by me on the following activities:  Development of treatment plan with patient or surrogate, discussions with consultants, evaluation of patient's response to treatment, examination of patient, ordering and review  of laboratory studies, ordering and review of radiographic studies, ordering and performing treatments and interventions, pulse oximetry, re-evaluation of patient's condition and review of old charts  ____________________________________________  INITIAL IMPRESSION / ASSESSMENT AND PLAN       Images ordered viewed and obtained by myself. Agree with Radiology interpretation. Details in ED course.  Labs ordered reviewed by myself as detailed in ED course.  Consultations obtained/considered detailed in ED course.    CRITICAL INTERVENTIONS:  Epi Fluids Antibiotics Goals of care discussions Admission   89 year old female that came here on epi infusion, atropine and hypoglycemia.  She was still hypotensive on arrival.  Her POA's were her sons.  Long discussions with them over the phone and they did not feel like epinephrine  would be something they wanted but they wanted to come in prior to shot it off.  Basic labs were ordered to see if anything easily reversible.  She was found to have a leukocytosis but no other endorgan damage.  Head CT was done secondary to the apparent trauma with the bruising on her head but no intracranial injuries.  Chest x-ray without pneumonia, urinalysis without urinary tract infection.  After all the family arrived we finished off her fluids and then shut off epinephrine  and her D10 infusion.  She had some soft pressures persistently but over a couple hours they stayed pretty consistent  with a MAP around 60.  Her temperature was the rectal 94.2 so Bair hugger applied.  After further discussion with the family we decided that may be DNR limited is more appropriate and who is get blood cultures and give her some antibiotics and fluids as needed and observe in the hospital for trajectory.  Healthcare power of attorney's and rest of family members at bedside are in agreement with this plan.  Will discuss with hospitalist for admission.   FINAL IMPRESSION Final diagnoses:  Hypotension, unspecified hypotension type  Hypoglycemia  Hypothermia, initial encounter     Disposition Medical screening exam was performed and I feel the patient has had appropriate emergency department evaluation and work-up for their chief complaint and is stable for ADMISSION to the hospital at this time.  I discussed with Dr. Marcene with the [facility] service and discussed labs, imaging and other work-up in the emergency room.  They agree to admission for further management and work-up of said condition.  ____________________________________________   NEW OUTPATIENT MEDICATIONS STARTED DURING THIS VISIT:  New Prescriptions   No medications on file    Note:  This note was prepared with assistance of Dragon voice recognition software. Occasional wrong-word or sound-a-like substitutions may have occurred due to the inherent limitations of voice recognition software.    Noheli Melder, Selinda, MD 09/05/24 [phone]  I kept in contact and updating hospitalist throughout her ER stay.  The hospital liaison will reach out after 8:00 to the admitting team.   Lorette Selinda, MD 09/05/24 [phone]

## 2024-09-05 NOTE — TOC Initial Note (Signed)
 Transition of Care [facility]) - Initial/Assessment Note    Patient Details  Name: Kiara Blair MRN: [id number] Date of Birth: [birth year]  [facility] CM/SW Contact:    Inocente GORMAN Kindle, LCSW Phone Number: 09/05/2024, 2:16 PM  Clinical Narrative:                 Patient admitted from [facility] with [facility]. CSW following for disposition needs.     Barriers to Discharge: Continued Medical Work up   Patient Goals and CMS Choice            Expected Discharge Plan and Services In-house Referral: Clinical Social Work     Living arrangements for the past 2 months: Assisted Living Facility                                      Prior Living Arrangements/Services Living arrangements for the past 2 months: Assisted Living Facility Lives with:: Facility Resident Patient language and need for interpreter reviewed:: Yes Do you feel safe going back to the place where you live?: Yes      Need for Family Participation in Patient Care: Yes (Comment) Care giver support system in place?: Yes (comment) Current home services: Hospice [facility]) Criminal Activity/Legal Involvement Pertinent to Current Situation/Hospitalization: No - Comment as needed  Activities of Daily Living      Permission Sought/Granted Permission sought to share information with : Facility Medical Sales Representative, Family Supports Permission granted to share information with : No  Share Information with NAME: [name]  [phone]/ [name] [phone]  Permission granted to share info w AGENCY: Hospice and ALF        Emotional Assessment Appearance:: Appears stated age Attitude/Demeanor/Rapport: Unable to Assess Affect (typically observed): Unable to Assess Orientation: : Oriented to Self, Oriented to Situation Alcohol  / Substance Use: Not Applicable Psych Involvement: No (comment)  Admission diagnosis:  Hypoglycemia [E16.2] Acute encephalopathy [G93.40] Hypothermia, initial encounter  [T68.XXXA] Hypotension, unspecified hypotension type [I95.9] Patient Active Problem List   Diagnosis Date Noted   Acute encephalopathy 09/05/2024   Fall at home, initial encounter 04/30/2024   Tendinopathy of left rotator cuff 04/23/2024   Altered mental status 02/24/2024   Lower urinary tract infectious disease 02/24/2024   Fall 02/24/2024   Rib fracture 02/24/2024   Chronic health problem 02/24/2024   Abdominal pain 02/24/2024   Acute metabolic encephalopathy 11/29/2022   Stroke-like symptoms 11/27/2022   Generalized anxiety disorder 03/22/2022   History of CVA (cerebrovascular accident) 01/04/2021   Essential hypertension 01/03/2021   T2DM (type 2 diabetes mellitus) (HCC) 01/03/2021   Paresthesia 05/01/2020   Nuclear sclerotic cataract of left eye 03/29/2018   Sensorineural hearing loss (SNHL) of both ears 03/13/2017   Gastroesophageal reflux disease 06/26/2016   Diabetic polyneuropathy associated with type 2 diabetes mellitus (HCC) 01/10/2016   Essential tremor 10/22/2015   Hyperlipidemia LDL goal <100 10/22/2015   PCP:  Patient, No Pcp Per Pharmacy:   [id number] [place], [place] - [address] [place] [address] Phone: [phone] Fax: [phone]  [facility] [address] [place] [address] Phone: [phone] Fax: [phone]  [facility] [address] [place] [address] Phone: [phone] Fax: [phone]     Social Drivers of Health (SDOH) Social History: SDOH Screenings   Food Insecurity: No Food Insecurity (05/01/2024)  Housing: Low Risk  (05/01/2024)  Transportation Needs: No Transportation Needs (05/01/2024)  Utilities: Not At Risk (05/01/2024)  Financial Resource Strain: Low Risk  (01/02/2024)   Received from [facility]  Physical Activity: Sufficiently Active (06/15/2020)   Received from [facility]  Social Connections:  Socially Isolated (05/01/2024)  Stress: Stress Concern Present (06/15/2020)   Received from [facility]  Tobacco Use: Low Risk  (07/19/2024)   SDOH Interventions:     Readmission Risk Interventions    05/06/2024   10:00 AM 04/24/2024   10:30 AM  Readmission Risk Prevention Plan  Transportation Screening Complete Complete  PCP or Specialist Appt within 3-5 Days  Complete  HRI or Home Care Consult  Complete  Social Work Consult for Recovery Care Planning/Counseling  Complete  Palliative Care Screening  Not Applicable  Medication Review Oceanographer) Complete Complete  PCP or Specialist appointment within 3-5 days of discharge Complete   HRI or Home Care Consult Complete   SW Recovery Care/Counseling Consult Complete   Palliative Care Screening Complete   Skilled Nursing Facility Not Applicable

## 2024-09-05 NOTE — ED Notes (Signed)
 Per family speech is not at baseline

## 2024-09-05 NOTE — ED Notes (Signed)
 Awaiting SLP eval.  MD Georgina notified and aware.

## 2024-09-05 NOTE — ED Notes (Signed)
 Posey bed alarm in place at this time

## 2024-09-05 NOTE — ED Notes (Signed)
 Epi drip from EMS running LR 1027ml/hr D10 1029ml/hr

## 2024-09-05 NOTE — Evaluation (Signed)
 Clinical/Bedside Swallow Evaluation Patient Details  Name: Darris Carachure MRN: [id number] Date of Birth: [birth year]  Today's Date: 09/05/2024 Time: SLP Start Time (ACUTE ONLY): 0933 SLP Stop Time (ACUTE ONLY): 1000 SLP Time Calculation (min) (ACUTE ONLY): 27 min  Past Medical History:  Past Medical History:  Diagnosis Date   Diabetes mellitus without complication (HCC)    Hypercholesteremia    Hypertension    TIA (transient ischemic attack) 01/03/2021   Tremor    right arm   Past Surgical History:  Past Surgical History:  Procedure Laterality Date   ABDOMINAL HYSTERECTOMY     APPENDECTOMY     BACK SURGERY     BLADDER SUSPENSION     CHOLECYSTECTOMY     EYE SURGERY     THORACENTESIS  05/01/2024   Procedure: THORACENTESIS;  Surgeon: Mannam, Praveen, MD;  Location: [facility];  Service: Cardiopulmonary;;   TONSILLECTOMY     HPI:  This is a 89 year old female who is on hospice and has baseline dementia, admitted with acute encephalopathy of unclear source.  Initially noted to be hypoglycemic, which is improved with amps of D50 as well as transient D10, but without significant improvement in the patient's mental status. Pt had a fall and CT head shows Chronic atrophic and ischemic changes without acute abnormality and Mild right parietal scalp swelling consistent with recent trauma. Son reports pts speech is clear at baseline. Prior clinical assessments recommend regular diet and thin liquids with simple precautions due to confusion, nomention of dysarthria. Son confirms pt continues a regular diet at her facility.    Assessment / Plan / Recommendation  Clinical Impression  Pt presents in the ED with signs of an acute dysphagia. Her son reports to SLP she had a great day yesterday and ate well at thanksgiving. Her speech is typically clear. Today pt has coughing with liquids, wet vocal quality and snoring type respirations. She is not ready for oral intake until further work up is  complete and goals of care are better defined. Pt may have meds with puree and sips of nectar thick water. SLP to f/u tomorrow for further trials with the hope that function may acutely improve if related to an encephalopathy. Pt may benefit from MBS if family wants her to complete one.   However, pt has bilateral symmetry and ROM on oral motor exam but also has severe dysarthria with abnormal resonance, severe pitch variability with voice breaks and tremulous phonation. There are sound distortions and phonemic substitution errors with word repetition and counting task. 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 Recommend neurology consult if consistent with patient plan of care.   SLP Visit Diagnosis: Dysarthria and anarthria (R47.1);Dysphagia, unspecified (R13.10)    Aspiration Risk       Diet Recommendation NPO;NPO except meds;Nectar-thick liquid    Liquid Administration via: Cup;Straw Medication Administration: Whole meds with puree Supervision: Staff to assist with self feeding Postural Changes: Seated upright at 90 degrees    Other  Recommendations       Assistance Recommended at Discharge    Functional Status Assessment Patient has had a recent decline in their functional status and demonstrates the ability to make significant improvements in function in a reasonable and predictable amount of time.  Frequency and Duration min 2x/week  2 weeks       Prognosis Prognosis for improved oropharyngeal function: Guarded Barriers to Reach Goals: Cognitive deficits;Severity of deficits      Swallow Study  General HPI: This is a 89 year old female who is on hospice and has baseline dementia, admitted with acute encephalopathy of unclear source.  Initially noted to be hypoglycemic, which is improved with amps of D50 as well as transient D10, but without significant improvement in the patient's mental status. Pt had a fall and CT head  shows Chronic atrophic and ischemic changes without acute abnormality and Mild right parietal scalp swelling consistent with recent trauma. Son reports pts speech is clear at baseline. Prior clinical assessments recommend regular diet and thin liquids with simple precautions due to confusion, nomention of dysarthria. Son confirms pt continues a regular diet at her facility. Type of Study: Bedside Swallow Evaluation Diet Prior to this Study: Regular;Thin liquids (Level 0) Temperature Spikes Noted: No Respiratory Status: Room air Behavior/Cognition: Alert;Cooperative;Pleasant mood Oral Cavity Assessment: Within Functional Limits Oral Care Completed by SLP: Yes Oral Cavity - Dentition: Adequate natural dentition Vision: Functional for self-feeding Self-Feeding Abilities: Needs assist Patient Positioning: Upright in bed Baseline Vocal Quality: Other (comment) (severe dysphonia) Volitional Cough: Strong Volitional Swallow: Able to elicit    Oral/Motor/Sensory Function Overall Oral Motor/Sensory Function: Within functional limits (severey dysarthria, but no overt impairment on oral motor exam)   Ice Chips     Thin Liquid Thin Liquid: Impaired Presentation: Straw;Cup Pharyngeal  Phase Impairments: Wet Vocal Quality;Throat Clearing - Immediate;Throat Clearing - Delayed;Cough - Immediate    Nectar Thick Nectar Thick Liquid: Not tested   Honey Thick Honey Thick Liquid: Not tested   Puree Puree: Impaired Presentation: Spoon Pharyngeal Phase Impairments: Wet Vocal Quality;Throat Clearing - Immediate   Solid     Solid: Impaired Oral Phase Functional Implications: Prolonged oral transit Pharyngeal Phase Impairments: Throat Clearing - Immediate      Timithy Arons, Consuelo Fitch 09/05/2024,11:05 AM

## 2024-09-05 NOTE — ED Triage Notes (Signed)
 Pt coming from nursing home, pt was found unresponsive with glucose in the 40s, HR in the 40s. En route pt started on Epi drip for HR, NS running. Atropine given by EMS

## 2024-09-05 NOTE — ED Notes (Signed)
 This RN spoke with CCMD

## 2024-09-05 NOTE — Progress Notes (Signed)
  Carryover admission to the Day Admitter.  I discussed this case with the EDP, Dr. Lorette.  Per these discussions:   This is a 89 year old female who is on hospice, who is being admitted with acute encephalopathy of unclear source.  Initially noted to be hypoglycemic, which is improved with amps of D50 as well as transient D10, but without significant improvement in the patient's mental status.  She was initially hypotensive, and arrived on epinephrine  drip.  EDP has subsequently discussed patient's case with admitting power of attorney, the patient's son, who confirms that the patient is DNR, DNI, and that the patient would not want any pressors.  Family okay with addressing reversible contributions towards the patient's acute encephalopathy, including they are amenable to IV antibiotics and IV fluids.  Family has a very low threshold for initiating full comfort care measures.  EDP is updated the hospice service as to the above, and reports that the hospice liaison will follow-up after 8 AM this morning.   I have placed an order for  obs for the above.   I have placed some additional preliminary admit orders via the adult multi-morbid admission order set. I have also ordered DNR/DNI, per family.     Eva Pore, DO Hospitalist

## 2024-09-05 NOTE — ED Notes (Signed)
 SLP recommends applesauce for pills and to hold food until conversation can be had with family about diet at facility.

## 2024-09-05 NOTE — ED Notes (Signed)
 Patient 2-assist to bedside commode. Very unsteady on her feet and maximum assist. Purewick in place and patient reoriented to its use.

## 2024-09-05 NOTE — ED Notes (Signed)
 SLP at bedside.

## 2024-09-05 NOTE — H&P (Addendum)
 History and Physical    Patient: Kiara Blair FMW:[id number] DOB: [birth year] DOA: 09/05/2024 DOS: the patient was seen and examined on 09/05/2024 PCP: Patient, No Pcp Per  Patient coming from: Home  Chief Complaint: No chief complaint on file.  HPI: Kiara Blair is a 89 y.o. female with medical history significant of HTN, HLD, DM2, prior TIA, and R arm tremor p/w acute encephalopathy c/b hypotension iso sepsis of presumed urinary origin.  Pt is a limited historian and unable to give a medical history iso confusion. From what I can gather per discussion with her son Kiara Blair via telephone, pt was BIBA EMS from her ALF after they determined that her blood sugar was low and she appeared confused. He notes that she has not been ill recently, and that she was at his home yesterday for Thanksgiving dinner and seemed to be her normal self.   Per night MD, Dr. Marcene, Pt initially noted to be hypoglycemic, which is improved with amps of D50 as well as transient D10, but without significant improvement in the patient's mental status.  She was initially hypotensive, and arrived on epinephrine  drip. EDP has subsequently discussed patient's case with admitting power of attorney, the patient's son, who confirms that the patient is DNR, DNI, and that the patient would not want any pressors.  Family okay with addressing reversible contributions towards the patient's acute encephalopathy, including they are amenable to IV antibiotics and IV fluids. Family has a very low threshold for initiating full comfort care measures.  In the ED, pt tachypneic and intermittently hypotensive. Labs notable for WBC 13.3 and lactic acid 2.6. UA w/ pyuria and bacteruria. Blood cultures pending. CTH showed chronic atrophic and ischemic changes without acute abnormality. CXR w/o acute changes and noted clearing at L lung base. EDP start IV Vancomycin and Cefepime, bolused 1L LR x1, and requested medicine admission.     Review  of Systems: As mentioned in the history of present illness. All other systems reviewed and are negative. Past Medical History:  Diagnosis Date   Diabetes mellitus without complication (HCC)    Hypercholesteremia    Hypertension    TIA (transient ischemic attack) 01/03/2021   Tremor    right arm   Past Surgical History:  Procedure Laterality Date   ABDOMINAL HYSTERECTOMY     APPENDECTOMY     BACK SURGERY     BLADDER SUSPENSION     CHOLECYSTECTOMY     EYE SURGERY     THORACENTESIS  05/01/2024   Procedure: THORACENTESIS;  Surgeon: Mannam, Praveen, MD;  Location: [facility];  Service: Cardiopulmonary;;   TONSILLECTOMY     Social History:  reports that she has never smoked. She has never used smokeless tobacco. She reports that she does not drink alcohol  and does not use drugs.  Allergies  Allergen Reactions   Doxycycline Hyclate Nausea And Vomiting   Levofloxacin Nausea And Vomiting   Meloxicam Nausea And Vomiting   Naproxen-Esomeprazole Mg Nausea And Vomiting   Nitrofurantoin Nausea And Vomiting   Esomeprazole Other (See Comments)    No reaction listed on MAR   Naproxen Other (See Comments)    No reaction listed on MAR   Other     Pt states she is allergic to meds but does not know names   Pneumococcal Polysaccharide Vaccine Itching    Family History  Problem Relation Age of Onset   CAD Father     Prior to Admission medications   Medication Sig Start Date End Date  Taking? Authorizing Provider  acetaminophen  (TYLENOL ) 325 MG tablet Take 2 tablets (650 mg total) by mouth every 6 (six) hours as needed. Patient taking differently: Take 650 mg by mouth every 6 (six) hours as needed for mild pain (pain score 1-3) or moderate pain (pain score 4-6). 12/06/21   Elnor Jayson LABOR, DO  amLODipine -benazepril  (LOTREL) 5-20 MG capsule Take 1 capsule by mouth at bedtime. 05/06/24   Dennise Lavada POUR, MD  aspirin  EC 81 MG tablet Take 1 tablet (81 mg total) by mouth daily. Swallow whole.  12/05/22   Lue Elsie BROCKS, MD  atorvastatin  (LIPITOR) 40 MG tablet Take 1 tablet (40 mg total) by mouth daily. Patient taking differently: Take 40 mg by mouth at bedtime. 02/08/21   Whitfield Raisin, NP  Carboxymethylcellulose Sodium (THERATEARS OP) Apply 1 drop to eye 3 (three) times daily.    [provider]  Cholecalciferol (VITAMIN D3) 50 MCG (2000 UT) TABS Take 1 tablet by mouth daily. 01/01/23   [provider]  diclofenac  Sodium (VOLTAREN ) 1 % GEL Apply 2 g topically 2 (two) times daily.    [provider]  escitalopram  (LEXAPRO ) 5 MG tablet Take 5 mg by mouth at bedtime. 01/26/23   [provider]  insulin  aspart (NOVOLOG ) 100 UNIT/ML FlexPen Before each meal 3 times a day, 140-199 - 2 units, 200-250 - 4 units, 251-299 - 6 units,  300-349 - 8 units,  350 or above 10 units. 05/06/24   Singh, Prashant K, MD  insulin  glargine-yfgn (SEMGLEE ) 100 UNIT/ML Pen Inject 6 Units into the skin at bedtime.    [provider]  lidocaine  (LIDODERM ) 5 % Place 1 patch onto the skin daily. Remove & Discard patch within 12 hours or as directed by MD 04/13/24   Melvenia Motto, MD  meclizine  (ANTIVERT ) 12.5 MG tablet Take 12.5 mg by mouth 3 (three) times daily as needed for dizziness or nausea. 04/22/24   [provider]  Melatonin 3 MG TBDP Take 3 mg by mouth at bedtime.    [provider]  mineral oil-hydrophilic petrolatum (AQUAPHOR) ointment Apply 1 Application topically 2 (two) times daily.    [provider]  Multiple Vitamin (DAILY VITE) TABS Take 1 tablet by mouth daily.    [provider]  propranolol  (INDERAL ) 10 MG tablet Take 10 mg by mouth See admin instructions. Give 1 tablet (10mg ) by mouth twice daily for essential tremor. Hold for HR < 55 or BP < 90/60. 01/05/21   [provider]  QUEtiapine  (SEROQUEL ) 25 MG tablet Take 1 tablet (25 mg total) by mouth at bedtime. 05/06/24   Singh, Prashant K, MD  SENNA-TABS 8.6 MG  tablet Take 1 tablet by mouth every other day. 04/09/24   [provider]  Vitamin D , Ergocalciferol , (DRISDOL) 1.25 MG (50000 UNIT) CAPS capsule Take 50,000 Units by mouth once a week. 04/14/24   [provider]    Physical Exam: Vitals:   09/05/24 0530 09/05/24 0535 09/05/24 0645 09/05/24 0734  BP: (!) 110/52  120/66   Pulse: 74  62   Resp:  (!) 27 (!) 22   Temp:      TempSrc:      SpO2: 100%  100% 100%   General: Alert, oriented x2 (name and place), resting comfortably in no acute distress Respiratory: Lungs clear to auscultation bilaterally with normal respiratory effort; no w/r/r Cardiovascular: Regular rate and rhythm w/o m/r/g Abdomen: Soft, nontender, nondistended. Positive bowel sounds   Data Reviewed:  Lab Results  Component Value Date   WBC 13.3 (H) 09/05/2024   HGB 10.4 (L) 09/05/2024   HCT 32.4 (L) 09/05/2024   MCV 86.2 09/05/2024   PLT 264 09/05/2024   Lab Results  Component Value Date   GLUCOSE 429 (H) 09/05/2024   CALCIUM  8.4 (L) 09/05/2024   NA 132 (L) 09/05/2024   K 3.7 09/05/2024   CO2 24 09/05/2024   CL 101 09/05/2024   BUN 24 (H) 09/05/2024   CREATININE 0.91 09/05/2024   Lab Results  Component Value Date   ALT 21 09/05/2024   AST 36 09/05/2024   ALKPHOS 124 09/05/2024   BILITOT 0.4 09/05/2024   Lab Results  Component Value Date   INR 1.0 04/30/2024   INR 1.0 04/30/2024   INR 0.9 01/03/2021   Radiology: DG Chest Portable 1 View Result Date: 09/05/2024 EXAM: 1 VIEW(S) XRAY OF THE CHEST 09/05/2024 02:22:49 AM COMPARISON: 05/04/2024 CLINICAL HISTORY: Recent fall. FINDINGS: LUNGS AND PLEURA: Interval clearing in the left base is noted. No pleural effusion. No pneumothorax. HEART AND MEDIASTINUM: Cardiac shadow is stable. Aortic calcifications are seen. BONES AND SOFT TISSUES: No acute osseous abnormality. IMPRESSION: 1. No acute cardiopulmonary process with interval clearing at the left lung base. Electronically signed by: Oneil Devonshire MD 09/05/2024 02:27 AM EST RP Workstation: [id number]   CT Maxillofacial Wo Contrast Result Date: 09/05/2024 EXAM: CT OF THE FACE WITHOUT CONTRAST 09/05/2024 02:18:00 AM TECHNIQUE: CT of the face was performed without the administration of intravenous contrast. Multiplanar reformatted images are provided for review. Automated exposure control, iterative reconstruction, and/or weight based adjustment of the mA/kV was utilized to reduce the radiation dose to as low as reasonably achievable. COMPARISON: None available. CLINICAL HISTORY: Recent fall with facial pain. FINDINGS: FACIAL BONES: No acute facial fracture. No mandibular dislocation. No suspicious bone lesion. ORBITS: Globes are intact. No acute traumatic injury. No inflammatory change. SINUSES AND MASTOIDS: No acute abnormality. SOFT TISSUES: No acute abnormality. IMPRESSION: 1. No acute facial fracture. 2. No soft tissue abnormality. Electronically signed by: Oneil Devonshire MD 09/05/2024 02:26 AM EST RP Workstation: [id number]   CT Head Wo Contrast Result Date: 09/05/2024 EXAM: CT HEAD WITHOUT CONTRAST 09/05/2024 02:18:00 AM TECHNIQUE: CT of the head was performed without the administration of intravenous contrast. Automated exposure control, iterative reconstruction, and/or weight based adjustment of the mA/kV was utilized to reduce the radiation dose to as low as reasonably achievable. COMPARISON: None available. CLINICAL HISTORY: Recent fall with headaches FINDINGS: BRAIN AND VENTRICLES: No acute hemorrhage. No evidence of acute infarct. No mass effect or midline shift. Chronic atrophic changes and white matter ischemic changes are noted. ORBITS: No acute abnormality. SINUSES: No acute abnormality. SOFT TISSUES AND SKULL: Mild scalp swelling is noted on the right in the parietal region. No skull fracture. IMPRESSION: 1. Chronic atrophic and ischemic changes without acute abnormality. 2. Mild right parietal scalp swelling consistent with recent  trauma. Electronically signed by: Oneil Devonshire MD 09/05/2024 02:23 AM EST RP Workstation: [id number]    Assessment and Plan: 4F h/o HTN, HLD, DM2, prior TIA, and R arm tremor p/w acute encephalopathy c/b hypotension iso sepsis of presumed urinary origin.  Acute encephalopathy c/b hypotension iso sepsis of presumed urinary origin S/p IV vancomycin and cefepime in ED; UA notable for pyria and bacteruria; WBC 13.3 and lactic acid 2.6 -SLP consulted; apprec eval/recs (ataxic dysarthria noted w/ recs for Neuro consult; will defer for now pending ongoing assessment; NPO w/ nectar thick liquids) -IV cefepime per  pharmacy protocol for now -MIVF: NS at 75cc/h for now -F/u urine and blodd cultures; titrate abx accordingly -F/u serial lactic acid until wnl per sepsis protocol  HTN -HOLD pta Lotrel 5-20mg  for now iso hypotension above  DM2 -HOLD pta Semglee  6U nightly for now -SSI TID prn -Mod Carb diet  Prior TIA -PTA ASA and statin  Mood disorder -PTA Lexapro    Advance Care Planning:   Code Status: Limited: Do not attempt resuscitation (DNR) -DNR-LIMITED -Do Not Intubate/DNI    Consults: N/A  Family Communication: Son  Severity of Illness: The appropriate patient status for this patient is INPATIENT. Inpatient status is judged to be reasonable and necessary in order to provide the required intensity of service to ensure the patient's safety. The patient's presenting symptoms, physical exam findings, and initial radiographic and laboratory data in the context of their chronic comorbidities is felt to place them at high risk for further clinical deterioration. Furthermore, it is not anticipated that the patient will be medically stable for discharge from the hospital within 2 midnights of admission.   * I certify that at the point of admission it is my clinical judgment that the patient will require inpatient hospital care spanning beyond 2 midnights from the point of admission due to high  intensity of service, high risk for further deterioration and high frequency of surveillance required.*   ------- I spent 57 minutes reviewing previous notes, at the bedside counseling/discussing the treatment plan, and performing clinical documentation.  Author: Marsha Ada, MD 09/05/2024 7:44 AM  For on call review [email or web address].

## 2024-09-06 ENCOUNTER — Inpatient Hospital Stay (HOSPITAL_COMMUNITY)

## 2024-09-06 DIAGNOSIS — F03918 Unspecified dementia, unspecified severity, with other behavioral disturbance: Secondary | ICD-10-CM

## 2024-09-06 DIAGNOSIS — E162 Hypoglycemia, unspecified: Secondary | ICD-10-CM

## 2024-09-06 DIAGNOSIS — T68XXXA Hypothermia, initial encounter: Secondary | ICD-10-CM

## 2024-09-06 DIAGNOSIS — G9340 Encephalopathy, unspecified: Secondary | ICD-10-CM | POA: Diagnosis not present

## 2024-09-06 LAB — CBC WITH DIFFERENTIAL/PLATELET
Abs Immature Granulocytes: 0.02 K/uL (ref 0.00–0.07)
Basophils Absolute: 0 K/uL (ref 0.0–0.1)
Basophils Relative: 0 %
Eosinophils Absolute: 0.1 K/uL (ref 0.0–0.5)
Eosinophils Relative: 1 %
HCT: 37.3 % (ref 36.0–46.0)
Hemoglobin: 12.3 g/dL (ref 12.0–15.0)
Immature Granulocytes: 0 %
Lymphocytes Relative: 26 %
Lymphs Abs: 2.2 K/uL (ref 0.7–4.0)
MCH: 27.5 pg (ref 26.0–34.0)
MCHC: 33 g/dL (ref 30.0–36.0)
MCV: 83.4 fL (ref 80.0–100.0)
Monocytes Absolute: 0.9 K/uL (ref 0.1–1.0)
Monocytes Relative: 11 %
Neutro Abs: 5.4 K/uL (ref 1.7–7.7)
Neutrophils Relative %: 62 %
Platelets: 278 K/uL (ref 150–400)
RBC: 4.47 MIL/uL (ref 3.87–5.11)
RDW: 17.2 % — ABNORMAL HIGH (ref 11.5–15.5)
WBC: 8.7 K/uL (ref 4.0–10.5)
nRBC: 0 % (ref 0.0–0.2)

## 2024-09-06 LAB — BLOOD CULTURE ID PANEL (REFLEXED) - BCID2

## 2024-09-06 LAB — GLUCOSE, CAPILLARY
Glucose-Capillary: 164 mg/dL — ABNORMAL HIGH (ref 70–99)
Glucose-Capillary: 220 mg/dL — ABNORMAL HIGH (ref 70–99)
Glucose-Capillary: 254 mg/dL — ABNORMAL HIGH (ref 70–99)
Glucose-Capillary: 52 mg/dL — ABNORMAL LOW (ref 70–99)
Glucose-Capillary: 78 mg/dL (ref 70–99)
Glucose-Capillary: 83 mg/dL (ref 70–99)

## 2024-09-06 LAB — CORTISOL: Cortisol, Plasma: 17.3 ug/dL

## 2024-09-06 LAB — COMPREHENSIVE METABOLIC PANEL WITH GFR
ALT: 22 U/L (ref 0–44)
AST: 34 U/L (ref 15–41)
Albumin: 3.5 g/dL (ref 3.5–5.0)
Alkaline Phosphatase: 149 U/L — ABNORMAL HIGH (ref 38–126)
Anion gap: 11 (ref 5–15)
BUN: 11 mg/dL (ref 8–23)
CO2: 24 mmol/L (ref 22–32)
Calcium: 9.1 mg/dL (ref 8.9–10.3)
Chloride: 101 mmol/L (ref 98–111)
Creatinine, Ser: 0.79 mg/dL (ref 0.44–1.00)
GFR, Estimated: >60 mL/min (ref >60)
Glucose, Bld: 231 mg/dL — ABNORMAL HIGH (ref 70–99)
Potassium: 3.7 mmol/L (ref 3.5–5.1)
Sodium: 136 mmol/L (ref 135–145)
Total Bilirubin: 0.9 mg/dL (ref 0.0–1.2)
Total Protein: 6.3 g/dL — ABNORMAL LOW (ref 6.5–8.1)

## 2024-09-06 LAB — BRAIN NATRIURETIC PEPTIDE: B Natriuretic Peptide: 325.5 pg/mL — ABNORMAL HIGH (ref 0.0–100.0)

## 2024-09-06 LAB — C-REACTIVE PROTEIN: CRP: 0.8 mg/dL (ref <1.0)

## 2024-09-06 LAB — PHOSPHORUS: Phosphorus: 2.7 mg/dL (ref 2.5–4.6)

## 2024-09-06 LAB — PROCALCITONIN: Procalcitonin: <0.1 ng/mL

## 2024-09-06 LAB — MRSA NEXT GEN BY PCR, NASAL: MRSA by PCR Next Gen: DETECTED — AB

## 2024-09-06 LAB — MAGNESIUM: Magnesium: 1.2 mg/dL — ABNORMAL LOW (ref 1.7–2.4)

## 2024-09-06 LAB — T4, FREE: Free T4: 0.93 ng/dL (ref 0.61–1.12)

## 2024-09-06 LAB — TSH: TSH: 1.347 u[IU]/mL (ref 0.350–4.500)

## 2024-09-06 MED ORDER — SODIUM CHLORIDE 0.9 % IV SOLN
2.0000 g | INTRAVENOUS | Status: DC
  Administered 2024-09-06 – 2024-09-08 (×3): 2 g via INTRAVENOUS
  Filled 2024-09-06 (×3): qty 20

## 2024-09-06 MED ORDER — DEXTROSE 50 % IV SOLN: 12.5000 g | INTRAVENOUS | Status: DC | PRN

## 2024-09-06 MED ORDER — VANCOMYCIN HCL 1250 MG/250ML IV SOLN: 1250.0000 mg | INTRAVENOUS | Status: DC

## 2024-09-06 MED ORDER — HYDROXYZINE HCL 25 MG PO TABS
25.0000 mg | ORAL_TABLET | Freq: Three times a day (TID) | ORAL | Status: DC | PRN
  Administered 2024-09-06 – 2024-09-07 (×3): 25 mg via ORAL
  Filled 2024-09-06 (×3): qty 1

## 2024-09-06 MED ORDER — INSULIN ASPART 100 UNIT/ML IJ SOLN
0.0000 [IU] | INTRAMUSCULAR | Status: DC
  Administered 2024-09-06: 1 [IU] via SUBCUTANEOUS
  Administered 2024-09-07: 2 [IU] via SUBCUTANEOUS
  Filled 2024-09-06: qty 2

## 2024-09-06 MED ORDER — MAGNESIUM SULFATE 4 GM/100ML IV SOLN
4.0000 g | Freq: Once | INTRAVENOUS | Status: AC
  Administered 2024-09-06: 4 g via INTRAVENOUS
  Filled 2024-09-06: qty 100

## 2024-09-06 MED ORDER — PROPRANOLOL HCL 10 MG PO TABS
10.0000 mg | ORAL_TABLET | Freq: Two times a day (BID) | ORAL | Status: DC
  Administered 2024-09-06 – 2024-09-08 (×5): 10 mg via ORAL
  Filled 2024-09-06 (×5): qty 1

## 2024-09-06 NOTE — Progress Notes (Signed)
 Kiara Blair 4T76 [facility] Liaison Note:   Kiara Blair is a current hospice patient with [facility] with a terminal diagnosis of Cerebrovascular Disease.  Patient was transported to ED from ALF where she was found to have increased confusion was hypoglycemic.  Patient is admitted under observation status. Per Dr. Norleen Sella with [facility] this is a related hospital admission. Patient is a DNR.  Upon arriving at bedside, patient was resting with eyes closed. Patient appeared to be in no discomfort. Spoke with bedside nurse who states that patient has been confused. States patient was requesting to get up and walk. When PT arrived patient refused. She did get up with NT and walked a few steps but was very weak. Was seen by Speech Therapy for swallow eval. SLP recommendation: Dys 3 (mechanical soft) solids, thin liquids. Will need supervision to assist with self feeding and to cue to initiate and encourage PO intake.   Patient remains GIP appropriate due to requiring IV antibiotics.    Vital Signs: 98.0/84/17   153/62   SpO2 none documented   I/O: 876.03/1999   Abnormal Labs:  09/06/24 05:51 Comprehensive metabolic panel with GFR: Rpt ! Glucose: 231 (H) Magnesium : 1.2 (L) Alkaline Phosphatase: 149 (H) Total Protein: 6.3 (L) B Natriuretic Peptide: 325.5 (H) RDW: 17.2 (H)  09/06/24 08:36 Glucose-Capillary: 220 (H)  Diagnostics:  09/06/24 05:37 MRSA by PCR Next Gen: DETECTED !  DG Chest Port 1 View  Date: 09/06/2024 Department: Kiara Blair 5W Medical   Narrative & Impression CLINICAL DATA:  Shortness of breath.   FINDINGS: The heart size and mediastinal contours are within normal limits. Pleural-parenchymal scarring in left lower lung shows no significant change. Pulmonary hyperinflation again noted, consistent with COPD. No evidence of acute infiltrate or pleural effusion.   IMPRESSION: COPD.  No acute findings.     Electronically  Signed   By: Norleen DELENA Kil M.D.   On: 09/06/2024 06:11    IV/PRN Meds:  0.9% Sodium Chloride  Infusion 75mL/hr IV d/c'd 11.29.25 @ 07:59, Cefepime 2g IV x2, Magnesium  Sulfate 4g IV x1, Seroquel  25mg  PO x1   Assessment and Plan per Raenelle Donalda HERO, MD 11.29.25 Acute metabolic encephalopathy Likely secondary to hypoglycemia CT head negative Clinically improved-very pleasantly confused this morning-able to answer simple questions appropriately.   Hypothermia/hypotension/bradycardia-likely secondary to hypoglycemia versus sepsis related to complicated UTI Probably all related to hypoglycemia but did have leukocytosis No PNA on CXR x 2, UA suggestive of UTI but hard to discern if she has symptoms given dementia, blood culture with Staph epidermidis which is likely contamination. Unfortunately-urine cultures not sent-likely low utility of sending it out at this point-Will likely be stairwells as patient has already had several doses of antibiotics.   Will stop vancomycin/cefepime and just place her on Rocephin  for a few more days.   DM-2 with hypoglycemia A1c pending CBG stable-allow some amount of permissive hyperglycemia Will avoid initiating long-acting insulin  at this point Continue with SSI and follow.   Recent Labs (last 2 labs)       Recent Labs    09/05/24 1624 09/05/24 2024 09/06/24 0836  GLUCAP 92 215* 220*        Hypomagnesemia Replete/recheck   HTN BP slowly creeping up Amlodipine /benazepril  remains on hold Resume low-dose propranolol  (also has history of essential tremors)   HLD Statin   Prior history of TIA Continue aspirin /statin   Essential tremor Resume propranolol    Frequent falls PT/OT eval ALF/memory care resident.  Mood disorder Lexapro    Dementia with delirium Pleasantly confused Standard delirium precautions Continue as needed Seroquel  Remains on melatonin.   Palliative care DNR in place-followed by hospice in the outpatient  setting Spoke with patient's son [name]-[phone]-okay with gentle medical treatment-does not want aggressive care.  He is okay with initiating diet-accepting full risk of aspiration.  Patient seems to have improved remarkably than on initial presentation-and does not appear to be acutely sick at this point-however if she were to significantly deteriorate-then family is even okay with hospice care.     Code Status: DNR   Discharge Planning: ongoing  Family Contact:  spoke with son [name] via phone. He states she spoke with provider and is hopeful patient may be able to discharge on Monday.    IDT: updated  Goals of Care: DNR   Should patient need ambulance transfer at discharge- please use [facility]) as they contract this service for our active hospice patients.    Please call with any hospice related questions or concerns.   Nat Babe, BSN, RN [facility] Liaison [phone]

## 2024-09-06 NOTE — Progress Notes (Signed)
 Speech Language Pathology Treatment: Dysphagia  Patient Details Name: Eliot Bencivenga MRN: [id number] DOB: 1929-12-26 Today's Date: 09/06/2024 Time: 0900-0920 SLP Time Calculation (min) (ACUTE ONLY): 20 min  Assessment / Plan / Recommendation Clinical Impression  SLP recommendation: Dys 3 (mechanical soft) solids, thin liquids. Will need supervision to assist with self feeding and to cue to initiate and encourage PO intake. SLP to s/o at this time.  SLP assessed patient's swallow at bedside with thin liquids, regular solids and puree solids. She was able to feed self after setup assistance. No immediate, overt s/s aspiration but patient with one instance of delayed, brief cough following PO's of graham crackers, thin liquids. Mastication was timely and no oral holding or pocketing of PO's. SLP recommending initiate PO diet and no further f/u indicated. Patient will benefit from supervision and assistance from staff here and at next venue of care to initiate and encourage PO intake.     HPI HPI: This is a 89 year old female who is on hospice and has baseline dementia, admitted with acute encephalopathy of unclear source.  Initially noted to be hypoglycemic, which is improved with amps of D50 as well as transient D10, but without significant improvement in the patient's mental status. Pt had a fall and CT head shows Chronic atrophic and ischemic changes without acute abnormality and Mild right parietal scalp swelling consistent with recent trauma. Son reports pts speech is clear at baseline. Prior clinical assessments recommend regular diet and thin liquids with simple precautions due to confusion, nomention of dysarthria. Son confirms pt continues a regular diet at her facility. MD secure message chatted with SLP on 11/29 after he spoke with patient's son [name] regarding GOC. Per MD, [name] agrees to resuming a PO diet and is accepting full risk of aspiration. MD requested that SLP re-assess patient's  swallow to determine least restrictive oral diet.      SLP Plan  Discharge SLP treatment due to (comment);All goals met          Recommendations  Diet recommendations: Dysphagia 3 (mechanical soft);Thin liquid Liquids provided via: Cup;Straw Medication Administration: Whole meds with puree Supervision: Patient able to self feed;Full supervision/cueing for compensatory strategies Compensations: Slow rate;Small sips/bites Postural Changes and/or Swallow Maneuvers: Seated upright 90 degrees                  Oral care BID   Frequent or constant Supervision/Assistance Dysphagia, unspecified (R13.10)     Discharge SLP treatment due to (comment);All goals met     Norleen IVAR Blase, MA, CCC-SLP Speech Therapy   09/06/2024, 11:48 AM

## 2024-09-06 NOTE — Evaluation (Signed)
 Physical Therapy Evaluation Patient Details Name: Kiara Blair MRN: [id number] DOB: 27-Feb-1930 Today's Date: 09/06/2024  History of Present Illness  Patient is a 89 y.o.  female found unresponsive at ALF/memory care, EMS was called-she was found to be hypoglycemic at 46-she was given glucose by mouth-her sugars increased to 320 but she remained sluggish she was also -hypotensive/bradycardic (heart rate in the 40s) and hypothermic.   She was subsequently placed on D10 infusion, epinephrine  infusion by EMS and brought to the emergency room-where further supportive care was provided-ED MD spoke with POA (son)-although DNR and followed by hospice-okay for gentle medical treatment. Past medical history of dementia, DM-2, HLD, HTN.  Clinical Impression  Pt presents with admitting diagnosis above. +2 Min/Mod A to stand. Pt not following commands and swatting at therapist and NTs stating that she can walk on her own however once let go pt immediately had a posterior LOB into the bed. After much deliberation with pt finally agreeable to sit in chair requiring +2 Mod A HHA to step pivot into chair. Heavy posterior lean noted. Pt not following commands at all and very argumentative. Unsure of pt baseline as no family or caregivers were present however per chart review it is noted that pt has had numerous falls. Patient will benefit from continued inpatient follow up therapy, <3 hours/day. PT will continue to follow.       If plan is discharge home, recommend the following: Two people to help with walking and/or transfers;A lot of help with bathing/dressing/bathroom;Assistance with cooking/housework;Direct supervision/assist for medications management;Assistance with feeding;Direct supervision/assist for financial management;Assist for transportation;Help with stairs or ramp for entrance;Supervision due to cognitive status   Can travel by private vehicle   No    Equipment Recommendations None recommended by PT   Recommendations for Other Services       Functional Status Assessment Patient has had a recent decline in their functional status and demonstrates the ability to make significant improvements in function in a reasonable and predictable amount of time.     Precautions / Restrictions Precautions Precautions: Fall Recall of Precautions/Restrictions: Impaired Restrictions Weight Bearing Restrictions Per Provider Order: No      Mobility  Bed Mobility Overal bed mobility: Needs Assistance Bed Mobility: Supine to Sit, Sit to Supine     Supine to sit: Contact guard Sit to supine: Contact guard assist   General bed mobility comments: CGA for safety.    Transfers Overall transfer level: Needs assistance Equipment used: 2 person hand held assist Transfers: Sit to/from Stand, Bed to chair/wheelchair/BSC Sit to Stand: +2 physical assistance, Min assist, Mod assist, +2 safety/equipment   Step pivot transfers: +2 physical assistance, Mod assist       General transfer comment: +2 Min/Mod A to stand. Pt not following commands and swatting at therapist and NTs stating that she cant walk on her own however once let go pt immediately had a posterior LOB into the bed. After much deliberation with pt finally agreeable to sit in chair requiring +2 Mod A HHA to step pivot into chair. Heavy posterior lean noted. Pt not following commands at all and very argumentative.    Ambulation/Gait Ambulation/Gait assistance: +2 physical assistance, Mod assist, Max assist, +2 safety/equipment Gait Distance (Feet): 5 Feet Assistive device: 2 person hand held assist Gait Pattern/deviations: Leaning posteriorly, Decreased stride length, Step-through pattern, Narrow base of support, Shuffle Gait velocity: decreased     General Gait Details: Heavy posterior lean for transfer into chair.  Stairs  Wheelchair Mobility     Tilt Bed    Modified Rankin (Stroke Patients Only)        Balance Overall balance assessment: Needs assistance Sitting-balance support: Bilateral upper extremity supported, Feet supported Sitting balance-Leahy Scale: Fair Sitting balance - Comments: Posterior lean Postural control: Posterior lean Standing balance support: Bilateral upper extremity supported, During functional activity Standing balance-Leahy Scale: Zero Standing balance comment: reliant on staff                             Pertinent Vitals/Pain Pain Assessment Pain Assessment: Faces Faces Pain Scale: No hurt    Home Living Family/patient expects to be discharged to:: Assisted living                 Home Equipment: Shower seat - built in;Grab bars - tub/shower;Rolling Environmental Consultant (2 wheels);Cane - single point;Wheelchair - manual;Grab bars - toilet Additional Comments: From memory care per chart review    Prior Function Prior Level of Function : Needs assist;Patient poor historian/Family not available             Mobility Comments: Has RW but per chart, pt does not always use it. Caregiver present, reports pt moves very quickly. (Pulled from previous admission. No family or caregiver present) ADLs Comments: has been needing assist for ADLs with exception of eating since prior hospitalization, typically requires supervision for showering, otherwise independent, facility provides meals and med management     Extremity/Trunk Assessment   Upper Extremity Assessment Upper Extremity Assessment: Overall WFL for tasks assessed    Lower Extremity Assessment Lower Extremity Assessment: Generalized weakness    Cervical / Trunk Assessment Cervical / Trunk Assessment: Kyphotic  Communication   Communication Communication: No apparent difficulties    Cognition Arousal: Alert Behavior During Therapy: Restless, Agitated, Anxious, Impulsive   PT - Cognitive impairments: History of cognitive impairments, No family/caregiver present to determine baseline                        PT - Cognition Comments: Dementia at baseline. Pt very agitated today swatting and swinging at this therapist and NTs. Pt stating that nothing was wrong with her and that this therapist was trying to kill her. Following commands: Impaired Following commands impaired: Follows one step commands inconsistently     Cueing Cueing Techniques: Verbal cues, Gestural cues, Tactile cues, Visual cues     General Comments General comments (skin integrity, edema, etc.): VSS    Exercises     Assessment/Plan    PT Assessment Patient needs continued PT services  PT Problem List Decreased strength;Decreased range of motion;Decreased mobility;Decreased balance;Decreased activity tolerance;Decreased coordination;Decreased cognition;Decreased knowledge of use of DME;Decreased safety awareness;Decreased knowledge of precautions;Cardiopulmonary status limiting activity       PT Treatment Interventions DME instruction;Gait training;Stair training;Functional mobility training;Therapeutic activities;Therapeutic exercise;Balance training;Neuromuscular re-education;Patient/family education;Cognitive remediation    PT Goals (Current goals can be found in the Care Plan section)  Acute Rehab PT Goals Patient Stated Goal: to go home PT Goal Formulation: Patient unable to participate in goal setting Time For Goal Achievement: 09/20/24 Potential to Achieve Goals: Poor    Frequency Min 1X/week     Co-evaluation               AM-PAC PT 6 Clicks Mobility  Outcome Measure Help needed turning from your back to your side while in a flat bed without using bedrails?: A Lot Help needed  moving from lying on your back to sitting on the side of a flat bed without using bedrails?: A Lot Help needed moving to and from a bed to a chair (including a wheelchair)?: A Lot Help needed standing up from a chair using your arms (e.g., wheelchair or bedside chair)?: A Lot Help needed to walk  in hospital room?: A Lot Help needed climbing 3-5 steps with a railing? : A Lot 6 Click Score: 12    End of Session Equipment Utilized During Treatment: Gait belt Activity Tolerance: Other (comment) (Limited by cognitive deficits) Patient left: in chair;with call bell/phone within reach;with nursing/sitter in room Nurse Communication: Mobility status PT Visit Diagnosis: Other abnormalities of gait and mobility (R26.89)    Time: 1202-1223 PT Time Calculation (min) (ACUTE ONLY): 21 min   Charges:   PT Evaluation $PT Eval Moderate Complexity: 1 Mod   PT General Charges $$ ACUTE PT VISIT: 1 Visit         Sueellen NOVAK, PT, DPT Acute Rehab Services [phone]   Sueellen Buddle 09/06/2024, 1:49 PM

## 2024-09-06 NOTE — Progress Notes (Signed)
 PROGRESS NOTE        PATIENT DETAILS Name: Kiara Blair Age: 89 y.o. Sex: female Date of Birth: 1930-06-05 Admit Date: 09/05/2024 Admitting Physician Eva KATHEE Pore, DO ERE:[facility], No Pcp Per  Brief Summary: Patient is a 89 y.o.  female with a history of dementia, DM-2, HLD, HTN-found unresponsive at ALF/memory care, EMS was called-she was found to be hypoglycemic at 46-she was given glucose by mouth-her sugars increased to 320 but she remained sluggish she was also -hypotensive/bradycardic (heart rate in the 40s) and hypothermic.   She was subsequently placed on D10 infusion, epinephrine  infusion by EMS and brought to the emergency room-where further supportive care was provided-ED MD spoke with POA (son)-although DNR and followed by hospice-okay for gentle medical treatment-hence hospitalist service was asked to admit this patient for further evaluation and treatment.  Significant events: 11/28>> admit to [facility].  Significant studies: 11/28>> CT head: No acute intracranial abnormality 11/28>> CT maxillofacial: No acute facial fracture 11/28>> CXR: No PNA. 11/29>> CXR: COPD/no PNA.  Significant microbiology data: 11/28>> blood culture: 1/4 gram-positive cocci (prelim-Staph epidermidis)  Procedures: None  Consults: None  Subjective: Awake-very pleasantly confused-able to tell me her age/name-and asking for breakfast.  Objective: Vitals: Blood pressure (!) 143/106, pulse 84, temperature 98.6 F (37 C), resp. rate 19, weight 58.9 kg, SpO2 97%.   Exam: Gen Exam:Awake-not in any distress HEENT:atraumatic, normocephalic Chest: B/L clear to auscultation anteriorly CVS:S1S2 regular Abdomen:soft non tender, non distended Extremities:no edema Neurology: Non focal Skin: no rash  Pertinent Labs/Radiology:    Latest Ref Rng & Units 09/06/2024    5:51 AM 09/05/2024    1:55 AM 07/03/2024    7:16 AM  CBC  WBC 4.0 - 10.5 K/uL 8.7  13.3  8.5    Hemoglobin 12.0 - 15.0 g/dL 87.6  89.5  87.8   Hematocrit 36.0 - 46.0 % 37.3  32.4  38.6   Platelets 150 - 400 K/uL 278  264  327     Lab Results  Component Value Date   NA 136 09/06/2024   K 3.7 09/06/2024   CL 101 09/06/2024   CO2 24 09/06/2024      Assessment/Plan: Acute metabolic encephalopathy Likely secondary to hypoglycemia CT head negative Clinically improved-very pleasantly confused this morning-able to answer simple questions appropriately.  Hypothermia/hypotension/bradycardia-likely secondary to hypoglycemia versus sepsis related to complicated UTI Probably all related to hypoglycemia but did have leukocytosis No PNA on CXR x 2, UA suggestive of UTI but hard to discern if she has symptoms given dementia, blood culture with Staph epidermidis which is likely contamination. Unfortunately-urine cultures not sent-likely low utility of sending it out at this point-Will likely be stairwells as patient has already had several doses of antibiotics.   Will stop vancomycin/cefepime and just place her on Rocephin  for a few more days.  DM-2 with hypoglycemia A1c pending CBG stable-allow some amount of permissive hyperglycemia Will avoid initiating long-acting insulin  at this point Continue with SSI and follow.  Recent Labs    09/05/24 1624 09/05/24 2024 09/06/24 0836  GLUCAP 92 215* 220*     Hypomagnesemia Replete/recheck  HTN BP slowly creeping up Amlodipine /benazepril  remains on hold Resume low-dose propranolol  (also has history of essential tremors)  HLD Statin  Prior history of TIA Continue aspirin /statin  Essential tremor Resume propranolol   Frequent falls PT/OT eval ALF/memory care resident.  Mood disorder Lexapro   Dementia with delirium Pleasantly confused Standard delirium precautions Continue as needed Seroquel  Remains on melatonin.  Palliative care DNR in place-followed by hospice in the outpatient setting Spoke with patient's son  [name]-okay with gentle medical treatment-does not want aggressive care.  He is okay with initiating diet-accepting full risk of aspiration.  Patient seems to have improved remarkably than on initial presentation-and does not appear to be acutely sick at this point-however if she were to significantly deteriorate-then family is even okay with hospice care.  Code status:   Code Status: Limited: Do not attempt resuscitation (DNR) -DNR-LIMITED -Do Not Intubate/DNI    DVT Prophylaxis: SCDs Start: 09/05/24 0647   Family Communication: patient's son [name]-updated 11/29   Disposition Plan: Status is: Inpatient Remains inpatient appropriate because: Severity of illness   Planned Discharge Destination:Assisted living   Diet: Diet Order             Diet NPO time specified Except for: Ice Chips, Other (See Comments)  Diet effective now                     Antimicrobial agents: Anti-infectives (From admission, onward)    Start     Dose/Rate Route Frequency Ordered Stop   09/06/24 2200  vancomycin (VANCOREADY) IVPB 1250 mg/250 mL        1,250 mg 166.7 mL/hr over 90 Minutes Intravenous Every 48 hours 09/06/24 0514     09/05/24 1900  ceFEPIme (MAXIPIME) 2 g in sodium chloride  0.9 % 100 mL IVPB        2 g 200 mL/hr over 30 Minutes Intravenous Every 12 hours 09/05/24 0751     09/05/24 0600  vancomycin (VANCOCIN) IVPB 1000 mg/200 mL premix        1,000 mg 200 mL/hr over 60 Minutes Intravenous  Once 09/05/24 0558 09/05/24 0832   09/05/24 0600  ceFEPIme (MAXIPIME) 2 g in sodium chloride  0.9 % 100 mL IVPB        2 g 200 mL/hr over 30 Minutes Intravenous  Once 09/05/24 0558 09/05/24 0747        MEDICATIONS: Scheduled Meds:  aspirin  EC  81 mg Oral Daily   atorvastatin   40 mg Oral Daily   escitalopram   5 mg Oral QHS   insulin  aspart  0-15 Units Subcutaneous TID WC   melatonin  5 mg Oral QHS   senna-docusate  2 tablet Oral BID   Continuous  Infusions:  ceFEPime (MAXIPIME) IV 2 g (09/06/24 0629)   magnesium  sulfate bolus IVPB     vancomycin     PRN Meds:.acetaminophen  **OR** acetaminophen , QUEtiapine    I have personally reviewed following labs and imaging studies  LABORATORY DATA: CBC: Recent Labs  Lab 09/05/24 0155 09/06/24 0551  WBC 13.3* 8.7  NEUTROABS 9.1* 5.4  HGB 10.4* 12.3  HCT 32.4* 37.3  MCV 86.2 83.4  PLT 264 278    Basic Metabolic Panel: Recent Labs  Lab 09/05/24 0155 09/06/24 0551  NA 132* 136  K 3.7 3.7  CL 101 101  CO2 24 24  GLUCOSE 429* 231*  BUN 24* 11  CREATININE 0.91 0.79  CALCIUM  8.4* 9.1  MG  --  1.2*  PHOS  --  2.7    GFR: Estimated Creatinine Clearance: 37.1 mL/min (by C-G formula based on SCr of 0.79 mg/dL).  Liver Function Tests: Recent Labs  Lab 09/05/24 0155 09/06/24 0551  AST 36 34  ALT 21 22  ALKPHOS 124 149*  BILITOT  0.4 0.9  PROT 5.1* 6.3*  ALBUMIN 2.8* 3.5   No results for input(s): LIPASE, AMYLASE in the last 168 hours. No results for input(s): AMMONIA in the last 168 hours.  Coagulation Profile: No results for input(s): INR, PROTIME in the last 168 hours.  Cardiac Enzymes: No results for input(s): CKTOTAL, CKMB, CKMBINDEX, TROPONINI in the last 168 hours.  BNP (last 3 results) No results for input(s): PROBNP in the last 8760 hours.  Lipid Profile: No results for input(s): CHOL, HDL, LDLCALC, TRIG, CHOLHDL, LDLDIRECT in the last 72 hours.  Thyroid Function Tests: Recent Labs    09/06/24 0551  TSH 1.347  FREET4 0.93    Anemia Panel: No results for input(s): VITAMINB12, FOLATE, FERRITIN, TIBC, IRON, RETICCTPCT in the last 72 hours.  Urine analysis:    Component Value Date/Time   COLORURINE STRAW (A) 09/05/2024 0238   APPEARANCEUR CLEAR 09/05/2024 0238   LABSPEC 1.008 09/05/2024 0238   PHURINE 7.0 09/05/2024 0238   GLUCOSEU >=500 (A) 09/05/2024 0238   HGBUR NEGATIVE 09/05/2024 0238    BILIRUBINUR NEGATIVE 09/05/2024 0238   KETONESUR NEGATIVE 09/05/2024 0238   PROTEINUR 30 (A) 09/05/2024 0238   NITRITE NEGATIVE 09/05/2024 0238   LEUKOCYTESUR SMALL (A) 09/05/2024 0238    Sepsis Labs: Lactic Acid, Venous    Component Value Date/Time   LATICACIDVEN 1.9 09/05/2024 0946    MICROBIOLOGY: Recent Results (from the past 240 hours)  Blood culture (routine x 2)     Status: None (Preliminary result)   Collection Time: 09/05/24  6:20 AM   Specimen: BLOOD  Result Value Ref Range Status   Specimen Description BLOOD LEFT ANTECUBITAL  Final   Special Requests   Final    BOTTLES DRAWN AEROBIC AND ANAEROBIC Blood Culture results may not be optimal due to an inadequate volume of blood received in culture bottles   Culture  Setup Time   Final    GRAM POSITIVE COCCI IN CLUSTERS IN BOTH AEROBIC AND ANAEROBIC BOTTLES CRITICAL RESULT CALLED TO, READ BACK BY AND VERIFIED WITH: PHARMD J LEDFORD 09/06/2024 @ 0444 BY AB Performed at [facility], [address], [place], [place] [address]    Culture GRAM POSITIVE COCCI  Final   Report Status PENDING  Incomplete  Blood Culture ID Panel (Reflexed)     Status: Abnormal   Collection Time: 09/05/24  6:20 AM  Result Value Ref Range Status   Enterococcus faecalis NOT DETECTED NOT DETECTED Final   Enterococcus Faecium NOT DETECTED NOT DETECTED Final   Listeria monocytogenes NOT DETECTED NOT DETECTED Final   Staphylococcus species DETECTED (A) NOT DETECTED Final    Comment: CRITICAL RESULT CALLED TO, READ BACK BY AND VERIFIED WITH: PHARMD J LEDFORD 09/06/2024 @ 0444 BY AB    Staphylococcus aureus (BCID) NOT DETECTED NOT DETECTED Final   Staphylococcus epidermidis DETECTED (A) NOT DETECTED Final    Comment: Methicillin (oxacillin) resistant coagulase negative staphylococcus. Possible blood culture contaminant (unless isolated from more than one blood culture draw or clinical case suggests pathogenicity). No antibiotic treatment is  indicated for blood  culture contaminants. CRITICAL RESULT CALLED TO, READ BACK BY AND VERIFIED WITH: PHARMD J LEDFORD 09/06/2024 @ 0444 BY AB    Staphylococcus lugdunensis NOT DETECTED NOT DETECTED Final   Streptococcus species NOT DETECTED NOT DETECTED Final   Streptococcus agalactiae NOT DETECTED NOT DETECTED Final   Streptococcus pneumoniae NOT DETECTED NOT DETECTED Final   Streptococcus pyogenes NOT DETECTED NOT DETECTED Final   A.calcoaceticus-baumannii NOT DETECTED  NOT DETECTED Final   Bacteroides fragilis NOT DETECTED NOT DETECTED Final   Enterobacterales NOT DETECTED NOT DETECTED Final   Enterobacter cloacae complex NOT DETECTED NOT DETECTED Final   Escherichia coli NOT DETECTED NOT DETECTED Final   Klebsiella aerogenes NOT DETECTED NOT DETECTED Final   Klebsiella oxytoca NOT DETECTED NOT DETECTED Final   Klebsiella pneumoniae NOT DETECTED NOT DETECTED Final   Proteus species NOT DETECTED NOT DETECTED Final   Salmonella species NOT DETECTED NOT DETECTED Final   Serratia marcescens NOT DETECTED NOT DETECTED Final   Haemophilus influenzae NOT DETECTED NOT DETECTED Final   Neisseria meningitidis NOT DETECTED NOT DETECTED Final   Pseudomonas aeruginosa NOT DETECTED NOT DETECTED Final   Stenotrophomonas maltophilia NOT DETECTED NOT DETECTED Final   Candida albicans NOT DETECTED NOT DETECTED Final   Candida auris NOT DETECTED NOT DETECTED Final   Candida glabrata NOT DETECTED NOT DETECTED Final   Candida krusei NOT DETECTED NOT DETECTED Final   Candida parapsilosis NOT DETECTED NOT DETECTED Final   Candida tropicalis NOT DETECTED NOT DETECTED Final   Cryptococcus neoformans/gattii NOT DETECTED NOT DETECTED Final   Methicillin resistance mecA/C DETECTED (A) NOT DETECTED Final    Comment: CRITICAL RESULT CALLED TO, READ BACK BY AND VERIFIED WITH: PHARMD J LEDFORD 09/06/2024 @ 0444 BY AB Performed at [facility], [address] [place], [place] [address]   Blood culture  (routine x 2)     Status: None (Preliminary result)   Collection Time: 09/05/24  6:30 AM   Specimen: BLOOD  Result Value Ref Range Status   Specimen Description BLOOD RIGHT ANTECUBITAL  Final   Special Requests   Final    BOTTLES DRAWN AEROBIC AND ANAEROBIC Blood Culture results may not be optimal due to an inadequate volume of blood received in culture bottles   Culture   Final    NO GROWTH < 12 HOURS Performed at [facility], [address] [place], [place] [address]    Report Status PENDING  Incomplete  MRSA Next Gen by PCR, Nasal     Status: Abnormal   Collection Time: 09/06/24  5:37 AM   Specimen: Nasal Mucosa; Nasal Swab  Result Value Ref Range Status   MRSA by PCR Next Gen DETECTED (A) NOT DETECTED Final    Comment: (NOTE) The GeneXpert MRSA Assay (FDA approved for NASAL specimens only), is one component of a comprehensive MRSA colonization surveillance program. It is not intended to diagnose MRSA infection nor to guide or monitor treatment for MRSA infections. Test performance is not FDA approved in patients less than 25 years old. Performed at [facility], [address] [place], [place] [address]     RADIOLOGY STUDIES/RESULTS: DG Chest Port 1 View Result Date: 09/06/2024 CLINICAL DATA:  Shortness of breath. EXAM: PORTABLE CHEST 1 VIEW COMPARISON:  09/05/2024 FINDINGS: The heart size and mediastinal contours are within normal limits. Pleural-parenchymal scarring in left lower lung shows no significant change. Pulmonary hyperinflation again noted, consistent with COPD. No evidence of acute infiltrate or pleural effusion. IMPRESSION: COPD.  No acute findings. Electronically Signed   By: Norleen DELENA Kil M.D.   On: 09/06/2024 06:11   DG Chest Portable 1 View Result Date: 09/05/2024 EXAM: 1 VIEW(S) XRAY OF THE CHEST 09/05/2024 02:22:49 AM COMPARISON: 05/04/2024 CLINICAL HISTORY: Recent fall. FINDINGS: LUNGS AND PLEURA: Interval clearing in the left base is noted.  No pleural effusion. No pneumothorax. HEART AND MEDIASTINUM: Cardiac shadow is stable. Aortic calcifications are  seen. BONES AND SOFT TISSUES: No acute osseous abnormality. IMPRESSION: 1. No acute cardiopulmonary process with interval clearing at the left lung base. Electronically signed by: Oneil Devonshire MD 09/05/2024 02:27 AM EST RP Workstation: [id number]   CT Maxillofacial Wo Contrast Result Date: 09/05/2024 EXAM: CT OF THE FACE WITHOUT CONTRAST 09/05/2024 02:18:00 AM TECHNIQUE: CT of the face was performed without the administration of intravenous contrast. Multiplanar reformatted images are provided for review. Automated exposure control, iterative reconstruction, and/or weight based adjustment of the mA/kV was utilized to reduce the radiation dose to as low as reasonably achievable. COMPARISON: None available. CLINICAL HISTORY: Recent fall with facial pain. FINDINGS: FACIAL BONES: No acute facial fracture. No mandibular dislocation. No suspicious bone lesion. ORBITS: Globes are intact. No acute traumatic injury. No inflammatory change. SINUSES AND MASTOIDS: No acute abnormality. SOFT TISSUES: No acute abnormality. IMPRESSION: 1. No acute facial fracture. 2. No soft tissue abnormality. Electronically signed by: Oneil Devonshire MD 09/05/2024 02:26 AM EST RP Workstation: [id number]   CT Head Wo Contrast Result Date: 09/05/2024 EXAM: CT HEAD WITHOUT CONTRAST 09/05/2024 02:18:00 AM TECHNIQUE: CT of the head was performed without the administration of intravenous contrast. Automated exposure control, iterative reconstruction, and/or weight based adjustment of the mA/kV was utilized to reduce the radiation dose to as low as reasonably achievable. COMPARISON: None available. CLINICAL HISTORY: Recent fall with headaches FINDINGS: BRAIN AND VENTRICLES: No acute hemorrhage. No evidence of acute infarct. No mass effect or midline shift. Chronic atrophic changes and white matter ischemic changes are noted. ORBITS: No  acute abnormality. SINUSES: No acute abnormality. SOFT TISSUES AND SKULL: Mild scalp swelling is noted on the right in the parietal region. No skull fracture. IMPRESSION: 1. Chronic atrophic and ischemic changes without acute abnormality. 2. Mild right parietal scalp swelling consistent with recent trauma. Electronically signed by: Oneil Devonshire MD 09/05/2024 02:23 AM EST RP Workstation: [id number]     LOS: 1 day   Donalda Applebaum, MD  [facility]    To contact the attending provider between 7A-7P or the covering provider during after hours 7P-7A, please log into the web site [email or web address] and access using universal [place] password for that web site. If you do not have the password, please call the hospital operator.  09/06/2024, 8:28 AM

## 2024-09-06 NOTE — Progress Notes (Signed)
 PHARMACY - PHYSICIAN COMMUNICATION CRITICAL VALUE ALERT - BLOOD CULTURE IDENTIFICATION (BCID)  Kiara Blair is an 89 y.o. female who presented to [facility] on 09/05/2024 with a chief complaint of sepsis  Name of physician (or Provider) Contacted: Dr. Alfornia  Current antibiotics: Cefepime  Changes to prescribed antibiotics recommended:  Add vancomycin for now Vancomycin 1250 mg IV q48h >>>Estimated AUC: 467 Already on Cefepime Trend WBC, temp, renal function  F/U infectious work-up Drug levels as indicated   Results for orders placed or performed during the hospital encounter of 09/05/24  Blood Culture ID Panel (Reflexed) (Collected: 09/05/2024  6:20 AM)  Result Value Ref Range   Enterococcus faecalis NOT DETECTED NOT DETECTED   Enterococcus Faecium NOT DETECTED NOT DETECTED   Listeria monocytogenes NOT DETECTED NOT DETECTED   Staphylococcus species DETECTED (A) NOT DETECTED   Staphylococcus aureus (BCID) NOT DETECTED NOT DETECTED   Staphylococcus epidermidis DETECTED (A) NOT DETECTED   Staphylococcus lugdunensis NOT DETECTED NOT DETECTED   Streptococcus species NOT DETECTED NOT DETECTED   Streptococcus agalactiae NOT DETECTED NOT DETECTED   Streptococcus pneumoniae NOT DETECTED NOT DETECTED   Streptococcus pyogenes NOT DETECTED NOT DETECTED   A.calcoaceticus-baumannii NOT DETECTED NOT DETECTED   Bacteroides fragilis NOT DETECTED NOT DETECTED   Enterobacterales NOT DETECTED NOT DETECTED   Enterobacter cloacae complex NOT DETECTED NOT DETECTED   Escherichia coli NOT DETECTED NOT DETECTED   Klebsiella aerogenes NOT DETECTED NOT DETECTED   Klebsiella oxytoca NOT DETECTED NOT DETECTED   Klebsiella pneumoniae NOT DETECTED NOT DETECTED   Proteus species NOT DETECTED NOT DETECTED   Salmonella species NOT DETECTED NOT DETECTED   Serratia marcescens NOT DETECTED NOT DETECTED   Haemophilus influenzae NOT DETECTED NOT DETECTED   Neisseria meningitidis NOT DETECTED NOT DETECTED    Pseudomonas aeruginosa NOT DETECTED NOT DETECTED   Stenotrophomonas maltophilia NOT DETECTED NOT DETECTED   Candida albicans NOT DETECTED NOT DETECTED   Candida auris NOT DETECTED NOT DETECTED   Candida glabrata NOT DETECTED NOT DETECTED   Candida krusei NOT DETECTED NOT DETECTED   Candida parapsilosis NOT DETECTED NOT DETECTED   Candida tropicalis NOT DETECTED NOT DETECTED   Cryptococcus neoformans/gattii NOT DETECTED NOT DETECTED   Methicillin resistance mecA/C DETECTED (A) NOT DETECTED    Clair Agent 09/06/2024  5:14 AM

## 2024-09-06 NOTE — Progress Notes (Signed)
 Hypoglycemic Event  CBG: 53 mg/dl @ 78:91   Treatment: 8 oz juice/soda  Symptoms: None  Follow-up CBG: Time: 2131 CBG Result:83 mg/dl  Possible Reasons for Event: Unknown  Comments/MD notified:Dr.Rathore made aware.    Kiara Blair

## 2024-09-07 DIAGNOSIS — G9340 Encephalopathy, unspecified: Secondary | ICD-10-CM | POA: Diagnosis not present

## 2024-09-07 LAB — BASIC METABOLIC PANEL WITH GFR
Anion gap: 13 (ref 5–15)
BUN: 14 mg/dL (ref 8–23)
CO2: 22 mmol/L (ref 22–32)
Calcium: 9 mg/dL (ref 8.9–10.3)
Chloride: 97 mmol/L — ABNORMAL LOW (ref 98–111)
Creatinine, Ser: 0.75 mg/dL (ref 0.44–1.00)
GFR, Estimated: >60 mL/min (ref >60)
Glucose, Bld: 223 mg/dL — ABNORMAL HIGH (ref 70–99)
Potassium: 4.2 mmol/L (ref 3.5–5.1)
Sodium: 132 mmol/L — ABNORMAL LOW (ref 135–145)

## 2024-09-07 LAB — MAGNESIUM: Magnesium: 1.9 mg/dL (ref 1.7–2.4)

## 2024-09-07 LAB — GLUCOSE, CAPILLARY
Glucose-Capillary: 230 mg/dL — ABNORMAL HIGH (ref 70–99)
Glucose-Capillary: 163 mg/dL — ABNORMAL HIGH (ref 70–99)
Glucose-Capillary: 278 mg/dL — ABNORMAL HIGH (ref 70–99)
Glucose-Capillary: 104 mg/dL — ABNORMAL HIGH (ref 70–99)
Glucose-Capillary: 242 mg/dL — ABNORMAL HIGH (ref 70–99)

## 2024-09-07 LAB — HEMOGLOBIN A1C
Hgb A1c MFr Bld: 8.7 % — ABNORMAL HIGH (ref 4.8–5.6)
Mean Plasma Glucose: 203 mg/dL

## 2024-09-07 MED ORDER — HALOPERIDOL LACTATE 5 MG/ML IJ SOLN
1.0000 mg | Freq: Once | INTRAMUSCULAR | Status: AC
  Administered 2024-09-07: 1 mg via INTRAVENOUS
  Filled 2024-09-07: qty 1

## 2024-09-07 MED ORDER — INSULIN ASPART 100 UNIT/ML IJ SOLN
0.0000 [IU] | Freq: Three times a day (TID) | INTRAMUSCULAR | Status: DC
  Administered 2024-09-07: 1 [IU] via SUBCUTANEOUS
  Administered 2024-09-07: 3 [IU] via SUBCUTANEOUS
  Administered 2024-09-08: 5 [IU] via SUBCUTANEOUS
  Administered 2024-09-08: 2 [IU] via SUBCUTANEOUS
  Filled 2024-09-07: qty 1
  Filled 2024-09-07: qty 5
  Filled 2024-09-07 (×2): qty 3

## 2024-09-07 NOTE — Plan of Care (Signed)
  Problem: Metabolic: Goal: Ability to maintain appropriate glucose levels will improve Outcome: Progressing   Problem: Nutritional: Goal: Maintenance of adequate nutrition will improve Outcome: Progressing   Problem: Clinical Measurements: Goal: Will remain free from infection Outcome: Progressing Goal: Diagnostic test results will improve Outcome: Progressing

## 2024-09-07 NOTE — Progress Notes (Signed)
 PROGRESS NOTE        PATIENT DETAILS Name: Kiara Blair Age: 89 y.o. Sex: female Date of Birth: [birth year] Admit Date: 09/05/2024 Admitting Physician Eva KATHEE Pore, DO ERE:[id number], No Pcp Per  Brief Summary: Patient is a 89 y.o.  female with a history of dementia, DM-2, HLD, HTN-found unresponsive at ALF/memory care, EMS was called-she was found to be hypoglycemic at 46-she was given glucose by mouth-her sugars increased to 320 but she remained sluggish she was also -hypotensive/bradycardic (heart rate in the 40s) and hypothermic.   She was subsequently placed on D10 infusion, epinephrine  infusion by EMS and brought to the emergency room-where further supportive care was provided-ED MD spoke with POA (son)-although DNR and followed by hospice-okay for gentle medical treatment-hence hospitalist service was asked to admit this patient for further evaluation and treatment.  Significant events: 11/28>> admit to [facility].  Significant studies: 11/28>> CT head: No acute intracranial abnormality 11/28>> CT maxillofacial: No acute facial fracture 11/28>> CXR: No PNA. 11/29>> CXR: COPD/no PNA.  Significant microbiology data: 11/28>> blood culture: 1/4 gram-positive cocci (prelim-Staph epidermidis)  Procedures: None  Consults: None  Subjective: Patient in bed awake, confused but denies headache chest or abdominal pain  Objective: Vitals: Blood pressure 129/87, pulse 64, temperature 98.2 F (36.8 C), temperature source Oral, resp. rate 18, weight 54.7 kg, SpO2 97%.   Exam:  Awake but pleasantly confused, No new F.N deficits, Normal affect [place].AT,PERRAL Supple Neck, No JVD,   Symmetrical Chest wall movement, Good air movement bilaterally, CTAB RRR,No Gallops, Rubs or new Murmurs,  +ve B.Sounds, Abd Soft, No tenderness,   No Cyanosis, Clubbing or edema     Assessment/Plan:  Acute metabolic encephalopathy Likely secondary to hypoglycemia CT head  negative Clinically improved-very pleasantly confused this morning-able to answer simple questions appropriately.  Hypothermia/hypotension/bradycardia-likely secondary to hypoglycemia versus sepsis related to complicated UTI Probably all related to hypoglycemia but did have leukocytosis No PNA on CXR x 2, UA suggestive of UTI but hard to discern if she has symptoms given dementia, blood culture with Staph epidermidis which is likely contamination. Unfortunately-urine cultures not sent-likely low utility of sending it out at this point-Will likely be stairwells as patient has already had several doses of antibiotics.   Will stop vancomycin/cefepime and just place her on Rocephin  for a few more days.  DM-2 with hypoglycemia A1c pending CBG stable-allow some amount of permissive hyperglycemia Will avoid initiating long-acting insulin  at this point On SSI CBG still dropping intermittently, cut down further, allow for permissive hyperglycemia considering her age and inconsistent oral intake.  Recent Labs    09/06/24 2131 09/06/24 2355 09/07/24 0349  GLUCAP 83 164* 230*    Lab Results  Component Value Date   HGBA1C 9.8 (H) 04/30/2024    Hypomagnesemia Replete/recheck  HTN BP slowly creeping up Amlodipine /benazepril  remains on hold Resume low-dose propranolol  (also has history of essential tremors)  HLD Statin  Prior history of TIA Continue aspirin /statin  Essential tremor Resume propranolol   Frequent falls PT/OT eval ALF/memory care resident.  Mood disorder Lexapro   Dementia with delirium Pleasantly confused Standard delirium precautions Continue as needed Seroquel  Remains on melatonin.  Palliative care DNR in place-followed by hospice in the outpatient setting Spoke with patient's son [name]-[phone]-okay with gentle medical treatment-does not want aggressive care.  He is okay with initiating diet-accepting full risk of aspiration.  Patient  seems to have  improved remarkably than on initial presentation-and does not appear to be acutely sick at this point-however if she were to significantly deteriorate-then family is even okay with hospice care.  Code status:   Code Status: Limited: Do not attempt resuscitation (DNR) -DNR-LIMITED -Do Not Intubate/DNI    DVT Prophylaxis: SCDs Start: 09/05/24 0647   Family Communication: patient's son [name]-[phone]-updated 11/29   Disposition Plan: Status is: Inpatient Remains inpatient appropriate because: Severity of illness   Planned Discharge Destination:Assisted living   Diet: Diet Order             DIET DYS 3 Room service appropriate? Yes; Fluid consistency: Thin  Diet effective now                     MEDICATIONS: Scheduled Meds:  aspirin  EC  81 mg Oral Daily   atorvastatin   40 mg Oral Daily   escitalopram   5 mg Oral QHS   insulin  aspart  0-6 Units Subcutaneous TID WC   melatonin  5 mg Oral QHS   propranolol   10 mg Oral BID   senna-docusate  2 tablet Oral BID   Continuous Infusions:  cefTRIAXone  (ROCEPHIN )  IV 2 g (09/06/24 1248)   PRN Meds:.acetaminophen  **OR** acetaminophen , dextrose , hydrOXYzine , QUEtiapine    I have personally reviewed following labs and imaging studies  LABORATORY DATA: CBC: Recent Labs  Lab 09/05/24 0155 09/06/24 0551  WBC 13.3* 8.7  NEUTROABS 9.1* 5.4  HGB 10.4* 12.3  HCT 32.4* 37.3  MCV 86.2 83.4  PLT 264 278    Basic Metabolic Panel: Recent Labs  Lab 09/05/24 0155 09/06/24 0551 09/07/24 0347  NA 132* 136 132*  K 3.7 3.7 4.2  CL 101 101 97*  CO2 24 24 22   GLUCOSE 429* 231* 223*  BUN 24* 11 14  CREATININE 0.91 0.79 0.75  CALCIUM  8.4* 9.1 9.0  MG  --  1.2* 1.9  PHOS  --  2.7  --     GFR: Estimated Creatinine Clearance: 37.1 mL/min (by C-G formula based on SCr of 0.75 mg/dL).  Liver Function Tests: Recent Labs  Lab 09/05/24 0155 09/06/24 0551  AST 36 34  ALT 21 22  ALKPHOS 124 149*  BILITOT 0.4 0.9  PROT  5.1* 6.3*  ALBUMIN 2.8* 3.5   No results for input(s): LIPASE, AMYLASE in the last 168 hours. No results for input(s): AMMONIA in the last 168 hours.  Coagulation Profile: No results for input(s): INR, PROTIME in the last 168 hours.  Cardiac Enzymes: No results for input(s): CKTOTAL, CKMB, CKMBINDEX, TROPONINI in the last 168 hours.  BNP (last 3 results) No results for input(s): PROBNP in the last 8760 hours.  Lipid Profile: No results for input(s): CHOL, HDL, LDLCALC, TRIG, CHOLHDL, LDLDIRECT in the last 72 hours.  Thyroid Function Tests: Recent Labs    09/06/24 0551  TSH 1.347  FREET4 0.93    Anemia Panel: No results for input(s): VITAMINB12, FOLATE, FERRITIN, TIBC, IRON, RETICCTPCT in the last 72 hours.  Urine analysis:    Component Value Date/Time   COLORURINE STRAW (A) 09/05/2024 0238   APPEARANCEUR CLEAR 09/05/2024 0238   LABSPEC 1.008 09/05/2024 0238   PHURINE 7.0 09/05/2024 0238   GLUCOSEU >=500 (A) 09/05/2024 0238   HGBUR NEGATIVE 09/05/2024 0238   BILIRUBINUR NEGATIVE 09/05/2024 0238   KETONESUR NEGATIVE 09/05/2024 0238   PROTEINUR 30 (A) 09/05/2024 0238   NITRITE NEGATIVE 09/05/2024 0238   LEUKOCYTESUR SMALL (A) 09/05/2024 0238  Sepsis Labs: Lactic Acid, Venous    Component Value Date/Time   LATICACIDVEN 1.9 09/05/2024 0946    MICROBIOLOGY: Recent Results (from the past 240 hours)  Blood culture (routine x 2)     Status: None (Preliminary result)   Collection Time: 09/05/24  6:20 AM   Specimen: BLOOD  Result Value Ref Range Status   Specimen Description BLOOD LEFT ANTECUBITAL  Final   Special Requests   Final    BOTTLES DRAWN AEROBIC AND ANAEROBIC Blood Culture results may not be optimal due to an inadequate volume of blood received in culture bottles   Culture  Setup Time   Final    GRAM POSITIVE COCCI IN CLUSTERS IN BOTH AEROBIC AND ANAEROBIC BOTTLES CRITICAL RESULT CALLED TO, READ BACK BY AND  VERIFIED WITH: PHARMD J LEDFORD 09/06/2024 @ 0444 BY AB Performed at [facility], [address], [place], [place] [address]    Culture GRAM POSITIVE COCCI  Final   Report Status PENDING  Incomplete  Blood Culture ID Panel (Reflexed)     Status: Abnormal   Collection Time: 09/05/24  6:20 AM  Result Value Ref Range Status   Enterococcus faecalis NOT DETECTED NOT DETECTED Final   Enterococcus Faecium NOT DETECTED NOT DETECTED Final   Listeria monocytogenes NOT DETECTED NOT DETECTED Final   Staphylococcus species DETECTED (A) NOT DETECTED Final    Comment: CRITICAL RESULT CALLED TO, READ BACK BY AND VERIFIED WITH: PHARMD J LEDFORD 09/06/2024 @ 0444 BY AB    Staphylococcus aureus (BCID) NOT DETECTED NOT DETECTED Final   Staphylococcus epidermidis DETECTED (A) NOT DETECTED Final    Comment: Methicillin (oxacillin) resistant coagulase negative staphylococcus. Possible blood culture contaminant (unless isolated from more than one blood culture draw or clinical case suggests pathogenicity). No antibiotic treatment is indicated for blood  culture contaminants. CRITICAL RESULT CALLED TO, READ BACK BY AND VERIFIED WITH: PHARMD J LEDFORD 09/06/2024 @ 0444 BY AB    Staphylococcus lugdunensis NOT DETECTED NOT DETECTED Final   Streptococcus species NOT DETECTED NOT DETECTED Final   Streptococcus agalactiae NOT DETECTED NOT DETECTED Final   Streptococcus pneumoniae NOT DETECTED NOT DETECTED Final   Streptococcus pyogenes NOT DETECTED NOT DETECTED Final   A.calcoaceticus-baumannii NOT DETECTED NOT DETECTED Final   Bacteroides fragilis NOT DETECTED NOT DETECTED Final   Enterobacterales NOT DETECTED NOT DETECTED Final   Enterobacter cloacae complex NOT DETECTED NOT DETECTED Final   Escherichia coli NOT DETECTED NOT DETECTED Final   Klebsiella aerogenes NOT DETECTED NOT DETECTED Final   Klebsiella oxytoca NOT DETECTED NOT DETECTED Final   Klebsiella pneumoniae NOT DETECTED NOT DETECTED Final    Proteus species NOT DETECTED NOT DETECTED Final   Salmonella species NOT DETECTED NOT DETECTED Final   Serratia marcescens NOT DETECTED NOT DETECTED Final   Haemophilus influenzae NOT DETECTED NOT DETECTED Final   Neisseria meningitidis NOT DETECTED NOT DETECTED Final   Pseudomonas aeruginosa NOT DETECTED NOT DETECTED Final   Stenotrophomonas maltophilia NOT DETECTED NOT DETECTED Final   Candida albicans NOT DETECTED NOT DETECTED Final   Candida auris NOT DETECTED NOT DETECTED Final   Candida glabrata NOT DETECTED NOT DETECTED Final   Candida krusei NOT DETECTED NOT DETECTED Final   Candida parapsilosis NOT DETECTED NOT DETECTED Final   Candida tropicalis NOT DETECTED NOT DETECTED Final   Cryptococcus neoformans/gattii NOT DETECTED NOT DETECTED Final   Methicillin resistance mecA/C DETECTED (A) NOT DETECTED Final    Comment: CRITICAL RESULT CALLED TO, READ BACK BY AND VERIFIED  WITH: PHARMD J LEDFORD 09/06/2024 @ 0444 BY AB Performed at [facility], [address], [place], [place] [address]   Blood culture (routine x 2)     Status: None (Preliminary result)   Collection Time: 09/05/24  6:30 AM   Specimen: BLOOD  Result Value Ref Range Status   Specimen Description BLOOD RIGHT ANTECUBITAL  Final   Special Requests   Final    BOTTLES DRAWN AEROBIC AND ANAEROBIC Blood Culture results may not be optimal due to an inadequate volume of blood received in culture bottles   Culture   Final    NO GROWTH 1 DAY Performed at [facility], [address] [place], [place] [address]    Report Status PENDING  Incomplete  MRSA Next Gen by PCR, Nasal     Status: Abnormal   Collection Time: 09/06/24  5:37 AM   Specimen: Nasal Mucosa; Nasal Swab  Result Value Ref Range Status   MRSA by PCR Next Gen DETECTED (A) NOT DETECTED Final    Comment: (NOTE) The GeneXpert MRSA Assay (FDA approved for NASAL specimens only), is one component of a comprehensive MRSA colonization  surveillance program. It is not intended to diagnose MRSA infection nor to guide or monitor treatment for MRSA infections. Test performance is not FDA approved in patients less than 27 years old. Performed at [facility], [address], [place], [place] [address]     RADIOLOGY STUDIES/RESULTS: DG Chest Port 1 View Result Date: 09/06/2024 CLINICAL DATA:  Shortness of breath. EXAM: PORTABLE CHEST 1 VIEW COMPARISON:  09/05/2024 FINDINGS: The heart size and mediastinal contours are within normal limits. Pleural-parenchymal scarring in left lower lung shows no significant change. Pulmonary hyperinflation again noted, consistent with COPD. No evidence of acute infiltrate or pleural effusion. IMPRESSION: COPD.  No acute findings. Electronically Signed   By: Norleen DELENA Kil M.D.   On: 09/06/2024 06:11     LOS: 2 days   Lavada Stank, MD  [facility]    To contact the attending provider between 7A-7P or the covering provider during after hours 7P-7A, please log into the web site [email or web address] and access using universal [place] password for that web site. If you do not have the password, please call the hospital operator.  09/07/2024, 8:33 AM

## 2024-09-07 NOTE — Progress Notes (Signed)
 Patient is severely agitated,confused ,paranoid and combative.She is screaming  and trying to climb out of the bed,swinging the legs at the staff.Dr. Franky ordered for 1 mg IV Haldol .Patient is little calm at this time.Will keep monitoring.  @00 :20 Patient became agitated again.Tried solicitor for 1:1 sitter.Sitter not available at this time.Did one more dose of 1 mg iv Haldol .

## 2024-09-07 NOTE — Plan of Care (Signed)
  Problem: Fluid Volume: Goal: Ability to maintain a balanced intake and output will improve Outcome: Progressing   Problem: Health Behavior/Discharge Planning: Goal: Ability to manage health-related needs will improve Outcome: Progressing   Problem: Nutritional: Goal: Progress toward achieving an optimal weight will improve Outcome: Progressing   Problem: Skin Integrity: Goal: Risk for impaired skin integrity will decrease Outcome: Progressing   Problem: Tissue Perfusion: Goal: Adequacy of tissue perfusion will improve Outcome: Progressing   Problem: Activity: Goal: Risk for activity intolerance will decrease Outcome: Progressing   Problem: Nutrition: Goal: Adequate nutrition will be maintained Outcome: Progressing

## 2024-09-07 NOTE — Progress Notes (Addendum)
 Kiara Blair [id number] [facility] Liaison Note:   Kiara Blair is a current hospice patient with [facility] with a terminal diagnosis of Cerebrovascular Disease.  Patient was transported to ED from ALF where she was found to have increased confusion was hypoglycemic.  Patient is admitted under observation status. Per Dr. Norleen Blair with [facility] this is a related hospital admission. Patient is a DNR.   Patient seen at bedside. She was attempting to get out of bed. [facility] Liaison and staff helped to get patient pulled up in bed. Patient remains confused. Was asking if husband was coming to visit her today. In speaking with patient son, he stated that patient husband passed several years ago.    Patient remains GIP appropriate due receiving IV antibiotics.    Vital Signs: 97.7/74/17   130/104   92% on room air   I/O: 100/none documented    Abnormal Labs:  09/07/24 03:47 Basic metabolic panel with GFR: Rpt ! Sodium: 132 (L) Chloride: 97 (L) Glucose: 223 (H)  09/07/24 03:49 Glucose-Capillary: 230 (H)  09/07/24 08:40 Glucose-Capillary: 163 (H)    Diagnostics:  None New    IV/PRN Meds:  Atarax  25mg  PO x2, Seroquel  25mg  PO x2, Rocephin  2g IV x1    Assessment and Plan per Kiara Lavada POUR, MD  11.30.25 Assessment/Plan: Acute metabolic encephalopathy Likely secondary to hypoglycemia CT head negative Clinically improved-very pleasantly confused this morning-able to answer simple questions appropriately.   Hypothermia/hypotension/bradycardia-likely secondary to hypoglycemia versus sepsis related to complicated UTI Probably all related to hypoglycemia but did have leukocytosis No PNA on CXR x 2, UA suggestive of UTI but hard to discern if she has symptoms given dementia, blood culture with Staph epidermidis which is likely contamination. Unfortunately-urine cultures not sent-likely low utility of sending it out at this point-Will likely be  stairwells as patient has already had several doses of antibiotics.   Will stop vancomycin/cefepime and just place her on Rocephin  for a few more days.   DM-2 with hypoglycemia A1c pending CBG stable-allow some amount of permissive hyperglycemia Will avoid initiating long-acting insulin  at this point On SSI CBG still dropping intermittently, cut down further, allow for permissive hyperglycemia considering her age and inconsistent oral intake.   Recent Labs (last 2 labs)       Recent Labs    09/06/24 2131 09/06/24 2355 09/07/24 0349  GLUCAP 83 164* 230*      Recent Labs       Lab Results  Component Value Date    HGBA1C 9.8 (H) 04/30/2024        Hypomagnesemia Replete/recheck   HTN BP slowly creeping up Amlodipine /benazepril  remains on hold Resume low-dose propranolol  (also has history of essential tremors)   HLD Statin   Prior history of TIA Continue aspirin /statin   Essential tremor Resume propranolol    Frequent falls PT/OT eval ALF/memory care resident.   Mood disorder Lexapro    Dementia with delirium Pleasantly confused Standard delirium precautions Continue as needed Seroquel  Remains on melatonin.   Palliative care DNR in place-followed by hospice in the outpatient setting Spoke with patient's son Kiara Blair-[phone]-okay with gentle medical treatment-does not want aggressive care.  He is okay with initiating diet-accepting full risk of aspiration.  Patient seems to have improved remarkably than on initial presentation-and does not appear to be acutely sick at this point-however if she were to significantly deteriorate-then family is even okay with hospice care.     Code Status: DNR   Discharge Planning: ongoing  Family Contact:  spoke with son Kiara Blair via phone. He states she spoke with provider and is hopeful patient may be able to discharge on Monday.     IDT: updated   Goals of Care: DNR   Should patient need ambulance transfer at  discharge- please use [facility]) as they contract this service for our active hospice patients.    Please call with any hospice related questions or concerns.   Kiara Blair, BSN, RN [facility] Liaison [phone]

## 2024-09-07 NOTE — Progress Notes (Signed)
 OT Cancellation Note and Discharge  Patient Details Name: Kiara Blair MRN: [id number] DOB: 08/06/1930   Cancelled Treatment:    Reason Eval/Treat Not Completed: Other (comment). With pt being at ALF/memory care and under hospice OT not completed since she would not be able to get OT back at ALF under Hospice. We will sign off.  Donny BECKER OT Acute Rehabilitation Services Office [phone]    [place] Dorothyann Distel 09/07/2024, 11:03 AM

## 2024-09-08 DIAGNOSIS — G9340 Encephalopathy, unspecified: Secondary | ICD-10-CM | POA: Diagnosis not present

## 2024-09-08 LAB — CULTURE, BLOOD (ROUTINE X 2)

## 2024-09-08 LAB — GLUCOSE, CAPILLARY
Glucose-Capillary: 247 mg/dL — ABNORMAL HIGH (ref 70–99)
Glucose-Capillary: 384 mg/dL — ABNORMAL HIGH (ref 70–99)

## 2024-09-08 MED ORDER — HALOPERIDOL LACTATE 5 MG/ML IJ SOLN
1.0000 mg | Freq: Once | INTRAMUSCULAR | Status: AC
  Administered 2024-09-08: 1 mg via INTRAVENOUS
  Filled 2024-09-08: qty 1

## 2024-09-08 NOTE — Discharge Summary (Addendum)
 Discharge summary note.  Kiara Blair FMW:[id number] DOB: 1930-07-06 DOA: 09/05/2024  PCP: Patient, No Pcp Per  Admit date: 09/05/2024  Discharge date: 09/08/2024  Admitted From: Memory Care with Hospice    Disposition:  Memory Care with Hospice    Recommendations for Outpatient Follow-up:   Follow up with PCP in 1-2 weeks  PCP Please obtain BMP/CBC, 2 view CXR in 1week,  (see Discharge instructions)   PCP Please follow up on the following pending results:     Home Health: None   Equipment/Devices: None  Consultations: None  Discharge Condition: Fair CODE STATUS: DNR    Diet Recommendation: Dysphagia 3 diet with feeding assistance and aspiration precaution    CC - Hypoglycemia  Brief history of present illness from the day of admission and additional interim summary    89 y.o.  female with a history of dementia, DM-2, HLD, HTN-found unresponsive at ALF/memory care, EMS was called-she was found to be hypoglycemic at 46-she was given glucose by mouth-her sugars increased to 320 but she remained sluggish she was also -hypotensive/bradycardic (heart rate in the 40s) and hypothermic.   She was subsequently placed on D10 infusion, epinephrine  infusion by EMS and brought to the emergency room-where further supportive care was provided-ED MD spoke with POA (son)-although DNR and followed by hospice-okay for gentle medical treatment-hence hospitalist service was asked to admit this patient for further evaluation and treatment.   Significant events: 11/28>> admit to [facility].   Significant studies: 11/28>> CT head: No acute intracranial abnormality 11/28>> CT maxillofacial: No acute facial fracture 11/28>> CXR: No PNA. 11/29>> CXR: COPD/no PNA.   Significant microbiology data: 11/28>> blood culture: 1/4  gram-positive cocci (prelim-Staph epidermidis)                                                                 Hospital Course    DM-2 with hypoglycemia Elderly patient with inconsistent oral intake, has frequent episodes of hypoglycemia, at this age focus more on comfort and allow some amount of permissive hyperglycemia Discontinue long-acting insulin , continue sliding scale check before every meal and at bedtime, sliding scale to be given before every meal 3 times only.  Adjust gently if needed at SNF.    Lab Results  Component Value Date   HGBA1C 8.7 (H) 09/06/2024   CBG (last 3)  Recent Labs    09/07/24 1624 09/07/24 2137 09/08/24 0736  GLUCAP 104* 242* 247*    Acute metabolic encephalopathy Likely secondary to hypoglycemia CT head negative Clinically improved-very pleasantly confused this morning-able to answer simple questions appropriately.  This likely is her baseline.   Hypothermia/hypotension/bradycardia-likely secondary to hypoglycemia versus sepsis related to complicated UTI Probably all related to hypoglycemia but did have leukocytosis No PNA on CXR x 2, UA suggestive of UTI but  hard to discern if she has symptoms given dementia, blood culture with Staph epidermidis which is likely contamination. Finished empiric treatment for possible UTI.  HTN New home regimen   HLD Statin   Prior history of TIA Continue aspirin /statin   Essential tremor Resume propranolol    Frequent falls PT/OT eval ALF/memory care resident.   Mood disorder Lexapro    Dementia with delirium Pleasantly confused Standard delirium precautions Continue as needed Seroquel  Remains on melatonin.   Palliative care DNR in place-followed by hospice in the outpatient setting  Discharge diagnosis     Principal Problem:   Acute encephalopathy    Discharge instructions    Discharge Instructions     Discharge instructions   Complete by: As directed    Follow with Primary MD/  SNF MD in 7 days   Get CBC, CMP, Magnesium , 2 view Chest X ray -  checked next visit with your primary MD or SNF MD   Activity: As tolerated with Full fall precautions use walker/cane & assistance as needed  Disposition SNF  Diet: Dysphagia 3 with feeding assistance and aspiration precautions.  Kindly check CBGs q. ACHS    Special Instructions: If you have smoked or chewed Tobacco  in the last 2 yrs please stop smoking, stop any regular Alcohol   and or any Recreational drug use.  On your next visit with your primary care physician please Get Medicines reviewed and adjusted.  Please request your Prim.MD to go over all Hospital Tests and Procedure/Radiological results at the follow up, please get all Hospital records sent to your Prim MD by signing hospital release before you go home.  If you experience worsening of your admission symptoms, develop shortness of breath, life threatening emergency, suicidal or homicidal thoughts you must seek medical attention immediately by calling 911 or calling your MD immediately  if symptoms less severe.  You Must read complete instructions/literature along with all the possible adverse reactions/side effects for all the Medicines you take and that have been prescribed to you. Take any new Medicines after you have completely understood and accpet all the possible adverse reactions/side effects.   Do not drive when taking Pain medications.  Do not take more than prescribed Pain, Sleep and Anxiety Medications  Wear Seat belts while driving.   Increase activity slowly   Complete by: As directed    No wound care   Complete by: As directed        Discharge Medications   Allergies as of 09/08/2024       Reactions   Doxycycline Hyclate Nausea And Vomiting   Levofloxacin Nausea And Vomiting   Meloxicam Nausea And Vomiting   Naproxen-esomeprazole Mg Nausea And Vomiting   Nitrofurantoin Nausea And Vomiting   Esomeprazole Other (See Comments)   No  reaction listed on MAR   Naproxen Other (See Comments)   No reaction listed on MAR   Other    Pt states she is allergic to meds but does not know names   Pneumococcal Polysaccharide Vaccine Itching        Medication List     STOP taking these medications    insulin  glargine-yfgn 100 UNIT/ML Pen Commonly known as: SEMGLEE    lidocaine  5 % Commonly known as: Lidoderm        TAKE these medications    acetaminophen  325 MG tablet Commonly known as: Tylenol  Take 2 tablets (650 mg total) by mouth every 6 (six) hours as needed. What changed: reasons to take this   amLODipine -benazepril  5-20  MG capsule Commonly known as: LOTREL Take 1 capsule by mouth at bedtime.   aspirin  EC 81 MG tablet Take 1 tablet (81 mg total) by mouth daily. Swallow whole.   atorvastatin  40 MG tablet Commonly known as: LIPITOR Take 1 tablet (40 mg total) by mouth daily. What changed: when to take this   Daily Vite Tabs Take 1 tablet by mouth daily.   escitalopram  5 MG tablet Commonly known as: LEXAPRO  Take 5 mg by mouth at bedtime.   insulin  aspart 100 UNIT/ML FlexPen Commonly known as: NOVOLOG  Before each meal 3 times a day, 140-199 - 2 units, 200-250 - 4 units, 251-299 - 6 units,  300-349 - 8 units,  350 or above 10 units.   meclizine  12.5 MG tablet Commonly known as: ANTIVERT  Take 12.5 mg by mouth 3 (three) times daily as needed for dizziness or nausea.   Melatonin 3 MG Tbdp Take 3 mg by mouth at bedtime.   mineral oil-hydrophilic petrolatum ointment Apply 1 Application topically 2 (two) times daily.   propranolol  10 MG tablet Commonly known as: INDERAL  Take 10 mg by mouth See admin instructions. Give 1 tablet (10mg ) by mouth twice daily for essential tremor. Hold for HR < 55 or BP < 90/60.   QUEtiapine  25 MG tablet Commonly known as: SEROQUEL  Take 1 tablet (25 mg total) by mouth at bedtime.   Senna-Tabs 8.6 MG tablet Generic drug: senna Take 1 tablet by mouth every other  day.   THERATEARS OP Apply 1 drop to eye 3 (three) times daily.   Vitamin D  (Ergocalciferol ) 1.25 MG (50000 UNIT) Caps capsule Commonly known as: DRISDOL Take 50,000 Units by mouth once a week.   Vitamin D3 50 MCG (2000 UT) Tabs Take 1 tablet by mouth daily.   Voltaren  1 % Gel Generic drug: diclofenac  Sodium Apply 2 g topically 2 (two) times daily.          Major procedures and Radiology Reports - PLEASE review detailed and final reports thoroughly  -     DG Chest Port 1 View Result Date: 09/06/2024 CLINICAL DATA:  Shortness of breath. EXAM: PORTABLE CHEST 1 VIEW COMPARISON:  09/05/2024 FINDINGS: The heart size and mediastinal contours are within normal limits. Pleural-parenchymal scarring in left lower lung shows no significant change. Pulmonary hyperinflation again noted, consistent with COPD. No evidence of acute infiltrate or pleural effusion. IMPRESSION: COPD.  No acute findings. Electronically Signed   By: Norleen DELENA Kil M.D.   On: 09/06/2024 06:11   DG Chest Portable 1 View Result Date: 09/05/2024 EXAM: 1 VIEW(S) XRAY OF THE CHEST 09/05/2024 02:22:49 AM COMPARISON: 05/04/2024 CLINICAL HISTORY: Recent fall. FINDINGS: LUNGS AND PLEURA: Interval clearing in the left base is noted. No pleural effusion. No pneumothorax. HEART AND MEDIASTINUM: Cardiac shadow is stable. Aortic calcifications are seen. BONES AND SOFT TISSUES: No acute osseous abnormality. IMPRESSION: 1. No acute cardiopulmonary process with interval clearing at the left lung base. Electronically signed by: Oneil Devonshire MD 09/05/2024 02:27 AM EST RP Workstation: [id number]   CT Maxillofacial Wo Contrast Result Date: 09/05/2024 EXAM: CT OF THE FACE WITHOUT CONTRAST 09/05/2024 02:18:00 AM TECHNIQUE: CT of the face was performed without the administration of intravenous contrast. Multiplanar reformatted images are provided for review. Automated exposure control, iterative reconstruction, and/or weight based adjustment of  the mA/kV was utilized to reduce the radiation dose to as low as reasonably achievable. COMPARISON: None available. CLINICAL HISTORY: Recent fall with facial pain. FINDINGS: FACIAL BONES: No acute facial fracture. No  mandibular dislocation. No suspicious bone lesion. ORBITS: Globes are intact. No acute traumatic injury. No inflammatory change. SINUSES AND MASTOIDS: No acute abnormality. SOFT TISSUES: No acute abnormality. IMPRESSION: 1. No acute facial fracture. 2. No soft tissue abnormality. Electronically signed by: Oneil Devonshire MD 09/05/2024 02:26 AM EST RP Workstation: [id number]   CT Head Wo Contrast Result Date: 09/05/2024 EXAM: CT HEAD WITHOUT CONTRAST 09/05/2024 02:18:00 AM TECHNIQUE: CT of the head was performed without the administration of intravenous contrast. Automated exposure control, iterative reconstruction, and/or weight based adjustment of the mA/kV was utilized to reduce the radiation dose to as low as reasonably achievable. COMPARISON: None available. CLINICAL HISTORY: Recent fall with headaches FINDINGS: BRAIN AND VENTRICLES: No acute hemorrhage. No evidence of acute infarct. No mass effect or midline shift. Chronic atrophic changes and white matter ischemic changes are noted. ORBITS: No acute abnormality. SINUSES: No acute abnormality. SOFT TISSUES AND SKULL: Mild scalp swelling is noted on the right in the parietal region. No skull fracture. IMPRESSION: 1. Chronic atrophic and ischemic changes without acute abnormality. 2. Mild right parietal scalp swelling consistent with recent trauma. Electronically signed by: Oneil Devonshire MD 09/05/2024 02:23 AM EST RP Workstation: [id number]    Micro Results    Recent Results (from the past 240 hours)  Blood culture (routine x 2)     Status: Abnormal (Preliminary result)   Collection Time: 09/05/24  6:20 AM   Specimen: BLOOD  Result Value Ref Range Status   Specimen Description BLOOD LEFT ANTECUBITAL  Final   Special Requests   Final     BOTTLES DRAWN AEROBIC AND ANAEROBIC Blood Culture results may not be optimal due to an inadequate volume of blood received in culture bottles   Culture  Setup Time   Final    GRAM POSITIVE COCCI IN CLUSTERS IN BOTH AEROBIC AND ANAEROBIC BOTTLES CRITICAL RESULT CALLED TO, READ BACK BY AND VERIFIED WITH: PHARMD J LEDFORD 09/06/2024 @ 0444 BY AB    Culture (A)  Final    STAPHYLOCOCCUS EPIDERMIDIS THE SIGNIFICANCE OF ISOLATING THIS ORGANISM FROM A SINGLE SET OF BLOOD CULTURES WHEN MULTIPLE SETS ARE DRAWN IS UNCERTAIN. PLEASE NOTIFY THE MICROBIOLOGY DEPARTMENT WITHIN ONE WEEK IF SPECIATION AND SENSITIVITIES ARE REQUIRED. Performed at [facility], [address], [place], [place] [address]    Report Status PENDING  Incomplete  Blood Culture ID Panel (Reflexed)     Status: Abnormal   Collection Time: 09/05/24  6:20 AM  Result Value Ref Range Status   Enterococcus faecalis NOT DETECTED NOT DETECTED Final   Enterococcus Faecium NOT DETECTED NOT DETECTED Final   Listeria monocytogenes NOT DETECTED NOT DETECTED Final   Staphylococcus species DETECTED (A) NOT DETECTED Final    Comment: CRITICAL RESULT CALLED TO, READ BACK BY AND VERIFIED WITH: PHARMD J LEDFORD 09/06/2024 @ 0444 BY AB    Staphylococcus aureus (BCID) NOT DETECTED NOT DETECTED Final   Staphylococcus epidermidis DETECTED (A) NOT DETECTED Final    Comment: Methicillin (oxacillin) resistant coagulase negative staphylococcus. Possible blood culture contaminant (unless isolated from more than one blood culture draw or clinical case suggests pathogenicity). No antibiotic treatment is indicated for blood  culture contaminants. CRITICAL RESULT CALLED TO, READ BACK BY AND VERIFIED WITH: PHARMD J LEDFORD 09/06/2024 @ 0444 BY AB    Staphylococcus lugdunensis NOT DETECTED NOT DETECTED Final   Streptococcus species NOT DETECTED NOT DETECTED Final   Streptococcus agalactiae NOT DETECTED NOT DETECTED Final   Streptococcus pneumoniae NOT  DETECTED NOT DETECTED Final  Streptococcus pyogenes NOT DETECTED NOT DETECTED Final   A.calcoaceticus-baumannii NOT DETECTED NOT DETECTED Final   Bacteroides fragilis NOT DETECTED NOT DETECTED Final   Enterobacterales NOT DETECTED NOT DETECTED Final   Enterobacter cloacae complex NOT DETECTED NOT DETECTED Final   Escherichia coli NOT DETECTED NOT DETECTED Final   Klebsiella aerogenes NOT DETECTED NOT DETECTED Final   Klebsiella oxytoca NOT DETECTED NOT DETECTED Final   Klebsiella pneumoniae NOT DETECTED NOT DETECTED Final   Proteus species NOT DETECTED NOT DETECTED Final   Salmonella species NOT DETECTED NOT DETECTED Final   Serratia marcescens NOT DETECTED NOT DETECTED Final   Haemophilus influenzae NOT DETECTED NOT DETECTED Final   Neisseria meningitidis NOT DETECTED NOT DETECTED Final   Pseudomonas aeruginosa NOT DETECTED NOT DETECTED Final   Stenotrophomonas maltophilia NOT DETECTED NOT DETECTED Final   Candida albicans NOT DETECTED NOT DETECTED Final   Candida auris NOT DETECTED NOT DETECTED Final   Candida glabrata NOT DETECTED NOT DETECTED Final   Candida krusei NOT DETECTED NOT DETECTED Final   Candida parapsilosis NOT DETECTED NOT DETECTED Final   Candida tropicalis NOT DETECTED NOT DETECTED Final   Cryptococcus neoformans/gattii NOT DETECTED NOT DETECTED Final   Methicillin resistance mecA/C DETECTED (A) NOT DETECTED Final    Comment: CRITICAL RESULT CALLED TO, READ BACK BY AND VERIFIED WITH: PHARMD J LEDFORD 09/06/2024 @ 0444 BY AB Performed at [facility], [address], [place], [place] [address]   Blood culture (routine x 2)     Status: None (Preliminary result)   Collection Time: 09/05/24  6:30 AM   Specimen: BLOOD  Result Value Ref Range Status   Specimen Description BLOOD RIGHT ANTECUBITAL  Final   Special Requests   Final    BOTTLES DRAWN AEROBIC AND ANAEROBIC Blood Culture results may not be optimal due to an inadequate volume of blood received in  culture bottles   Culture   Final    NO GROWTH 2 DAYS Performed at [facility], [address], [place], [place] [address]    Report Status PENDING  Incomplete  MRSA Next Gen by PCR, Nasal     Status: Abnormal   Collection Time: 09/06/24  5:37 AM   Specimen: Nasal Mucosa; Nasal Swab  Result Value Ref Range Status   MRSA by PCR Next Gen DETECTED (A) NOT DETECTED Final    Comment: (NOTE) The GeneXpert MRSA Assay (FDA approved for NASAL specimens only), is one component of a comprehensive MRSA colonization surveillance program. It is not intended to diagnose MRSA infection nor to guide or monitor treatment for MRSA infections. Test performance is not FDA approved in patients less than 88 years old. Performed at [facility], [address], [place], [place] [address]     Today   Subjective    Kiara Blair today has no headache,no chest abdominal pain,no new weakness tingling or numbness, feels much better, remains pleasantly confused   Objective   Blood pressure 160/65 pulse 63, temperature 97.6 F (36.4 C), temperature source Axillary, resp. rate 19, weight 54.7 kg, SpO2 100%.  No intake or output data in the 24 hours ending 09/08/24 0920  Exam  Awake Alert, No new F.N deficits,    [place].AT,PERRAL Supple Neck,   Symmetrical Chest wall movement, Good air movement bilaterally, CTAB RRR,No Gallops,   +ve B.Sounds, Abd Soft, Non tender,  No Cyanosis, Clubbing or edema    Data Review   Recent Labs  Lab 09/05/24 0155 09/06/24 0551  WBC 13.3* 8.7  HGB 10.4* 12.3  HCT 32.4* 37.3  PLT 264 278  MCV 86.2 83.4  MCH 27.7 27.5  MCHC 32.1 33.0  RDW 17.5* 17.2*  LYMPHSABS 3.1 2.2  MONOABS 0.9 0.9  EOSABS 0.1 0.1  BASOSABS 0.0 0.0    Recent Labs  Lab 09/05/24 0155 09/05/24 0637 09/05/24 0946 09/06/24 0551 09/07/24 0347  NA 132*  --   --  136 132*  K 3.7  --   --  3.7 4.2  CL 101  --   --  101 97*  CO2 24  --   --  24 22  ANIONGAP 7  --   --  11 13   GLUCOSE 429*  --   --  231* 223*  BUN 24*  --   --  11 14  CREATININE 0.91  --   --  0.79 0.75  AST 36  --   --  34  --   ALT 21  --   --  22  --   ALKPHOS 124  --   --  149*  --   BILITOT 0.4  --   --  0.9  --   ALBUMIN 2.8*  --   --  3.5  --   CRP  --   --   --  0.8  --   PROCALCITON  --   --   --  <0.10  --   LATICACIDVEN  --  2.6* 1.9  --   --   TSH  --   --   --  1.347  --   HGBA1C  --   --   --  8.7*  --   BNP  --   --   --  325.5*  --   MG  --   --   --  1.2* 1.9  PHOS  --   --   --  2.7  --   CALCIUM  8.4*  --   --  9.1 9.0    Total Time in preparing paper work, data evaluation and todays exam - 35 minutes  Signature  -    Lavada Stank M.D on 09/08/2024 at 9:20 AM   -  To page go to [email or web address]

## 2024-09-08 NOTE — TOC Progression Note (Signed)
 Transition of Care [facility]) - Progression Note    Patient Details  Name: Kiara Blair MRN: [id number] Date of Birth: 09/25/1930  [facility]) CM/SW Contact  Bernardino Dean, [place] Phone Number: 09/08/2024, 11:36 AM  Clinical Narrative:    915: CSW received notice patient may be for discharge today, back to memory care at [facility] and with resumption of Hospice through [facility].   9:30: CSW called patient's son [name], confirmed discharge plan as above.   11:30: Have discussed with several [facility] staff members, most recently Latonya. Issues with faxing d/c paperwork. Will email to [email or web address].    Expected Discharge Plan: Memory Care Barriers to Discharge: No Barriers Identified               Expected Discharge Plan and Services In-house Referral: Clinical Social Work     Living arrangements for the past 2 months: Assisted Living Facility (Memory care) Expected Discharge Date: 09/08/24                                     Social Drivers of Health (SDOH) Interventions SDOH Screenings   Food Insecurity: Patient Unable To Answer (09/05/2024)  Housing: Unknown (09/05/2024)  Transportation Needs: Patient Unable To Answer (09/05/2024)  Utilities: Patient Unable To Answer (09/05/2024)  Financial Resource Strain: Low Risk  (01/02/2024)   Received from [facility]  Physical Activity: Sufficiently Active (06/15/2020)   Received from [facility]  Social Connections: Patient Unable To Answer (09/05/2024)  Stress: Stress Concern Present (06/15/2020)   Received from [facility]  Tobacco Use: Low Risk  (07/19/2024)    Readmission Risk Interventions    05/06/2024   10:00 AM 04/24/2024   10:30 AM  Readmission Risk Prevention Plan  Transportation Screening Complete Complete  PCP or Specialist Appt within 3-5 Days  Complete  HRI or Home Care Consult  Complete  Social Work Consult for Recovery Care Planning/Counseling  Complete   Palliative Care Screening  Not Applicable  Medication Review Oceanographer) Complete Complete  PCP or Specialist appointment within 3-5 days of discharge Complete   HRI or Home Care Consult Complete   SW Recovery Care/Counseling Consult Complete   Palliative Care Screening Complete   Skilled Nursing Facility Not Applicable

## 2024-09-08 NOTE — Plan of Care (Signed)
  Problem: Coping: Goal: Ability to adjust to condition or change in health will improve Outcome: Progressing   Problem: Fluid Volume: Goal: Ability to maintain a balanced intake and output will improve Outcome: Progressing   Problem: Metabolic: Goal: Ability to maintain appropriate glucose levels will improve Outcome: Progressing   Problem: Skin Integrity: Goal: Risk for impaired skin integrity will decrease Outcome: Progressing   Problem: Education: Goal: Knowledge of General Education information will improve Description: Including pain rating scale, medication(s)/side effects and non-pharmacologic comfort measures Outcome: Progressing   Problem: Clinical Measurements: Goal: Will remain free from infection Outcome: Progressing Goal: Diagnostic test results will improve Outcome: Progressing

## 2024-09-08 NOTE — TOC Transition Note (Signed)
 Transition of Care [facility]) - Discharge Note   Patient Details  Name: Kiara Blair MRN: [id number] Date of Birth: 1930-06-11  Transition of Care [facility]) CM/SW Contact:  Inocente GORMAN Kindle, LCSW Phone Number: 09/08/2024, 2:44 PM   Clinical Narrative:    Patient will DC to: [facility] Anticipated DC date: 09/08/24 Family notified: Son, [name] Transport by: [facility]   Per MD patient ready for DC to [facility]. RN to call report prior to discharge Rick [phone]). RN, patient, patient's family, and facility notified of DC. Discharge Summary and FL2 sent to facility. DC packet on chart including signed DNR. Ambulance transport requested for patient.   CSW will sign off for now as social work intervention is no longer needed. Please consult us  again if new needs arise.     Final next level of care: Memory Care Barriers to Discharge: No Barriers Identified   Patient Goals and CMS Choice     Choice offered to / list presented to : NA [place] ownership interest in [facility].provided to:: Parent NA    Discharge Placement                       Discharge Plan and Services Additional resources added to the After Visit Summary for   In-house Referral: Clinical Social Work                                   Social Drivers of Health (SDOH) Interventions SDOH Screenings   Food Insecurity: Patient Unable To Answer (09/05/2024)  Housing: Unknown (09/05/2024)  Transportation Needs: Patient Unable To Answer (09/05/2024)  Utilities: Patient Unable To Answer (09/05/2024)  Financial Resource Strain: Low Risk  (01/02/2024)   Received from [facility]  Physical Activity: Sufficiently Active (06/15/2020)   Received from [facility]  Social Connections: Patient Unable To Answer (09/05/2024)  Stress: Stress Concern Present (06/15/2020)   Received from [facility]  Tobacco Use: Low Risk  (07/19/2024)     Readmission Risk Interventions     09/08/2024    2:43 PM 05/06/2024   10:00 AM 04/24/2024   10:30 AM  Readmission Risk Prevention Plan  Transportation Screening Complete Complete Complete  PCP or Specialist Appt within 3-5 Days   Complete  HRI or Home Care Consult   Complete  Social Work Consult for Recovery Care Planning/Counseling   Complete  Palliative Care Screening   Not Applicable  Medication Review Oceanographer) Complete Complete Complete  PCP or Specialist appointment within 3-5 days of discharge Complete Complete   HRI or Home Care Consult Complete Complete   SW Recovery Care/Counseling Consult Complete Complete   Palliative Care Screening Not Applicable Complete   Skilled Nursing Facility Not Applicable Not Applicable

## 2024-09-08 NOTE — Progress Notes (Signed)
 Attempted to call SNF for handoff.Assigned person couldn't be reached out.

## 2024-09-08 NOTE — Discharge Instructions (Signed)
 Follow with Primary MD/ SNF MD in 7 days   Get CBC, CMP, Magnesium , 2 view Chest X ray -  checked next visit with your primary MD or SNF MD   Activity: As tolerated with Full fall precautions use walker/cane & assistance as needed  Disposition SNF  Diet: Dysphagia 3 with feeding assistance and aspiration precautions.  Kindly check CBGs q. ACHS    Special Instructions: If you have smoked or chewed Tobacco  in the last 2 yrs please stop smoking, stop any regular Alcohol   and or any Recreational drug use.  On your next visit with your primary care physician please Get Medicines reviewed and adjusted.  Please request your Prim.MD to go over all Hospital Tests and Procedure/Radiological results at the follow up, please get all Hospital records sent to your Prim MD by signing hospital release before you go home.  If you experience worsening of your admission symptoms, develop shortness of breath, life threatening emergency, suicidal or homicidal thoughts you must seek medical attention immediately by calling 911 or calling your MD immediately  if symptoms less severe.  You Must read complete instructions/literature along with all the possible adverse reactions/side effects for all the Medicines you take and that have been prescribed to you. Take any new Medicines after you have completely understood and accpet all the possible adverse reactions/side effects.   Do not drive when taking Pain medications.  Do not take more than prescribed Pain, Sleep and Anxiety Medications  Wear Seat belts while driving.

## 2024-09-08 NOTE — NC FL2 (Signed)
 [place]  MEDICAID [id number] LEVEL OF CARE FORM     IDENTIFICATION  Patient Name: Kiara Blair Birthdate: Sep 15, 1930 Sex: female Admission Date (Current Location): 09/05/2024  [facility] and [place] Number:  Producer, Television/film/video and Address:  [facility] [place], [address], [place], [place] [address]      Provider Number: [id number]  Attending Physician Name and Address:  Dennise Lavada POUR, MD  Relative Name and Phone Number:  Josclyn Rosales (son)  [phone]    Current Level of Care: Hospital Recommended Level of Care: Memory Care Prior Approval Number:    Date Approved/Denied:   PASRR Number:    Discharge Plan: Other (Comment) (Memory Care)    Current Diagnoses: Patient Active Problem List   Diagnosis Date Noted   Acute encephalopathy 09/05/2024   Fall at home, initial encounter 04/30/2024   Tendinopathy of left rotator cuff 04/23/2024   Altered mental status 02/24/2024   Lower urinary tract infectious disease 02/24/2024   Fall 02/24/2024   Rib fracture 02/24/2024   Chronic health problem 02/24/2024   Abdominal pain 02/24/2024   Acute metabolic encephalopathy 11/29/2022   Stroke-like symptoms 11/27/2022   Generalized anxiety disorder 03/22/2022   History of CVA (cerebrovascular accident) 01/04/2021   Essential hypertension 01/03/2021   T2DM (type 2 diabetes mellitus) (HCC) 01/03/2021   Paresthesia 05/01/2020   Nuclear sclerotic cataract of left eye 03/29/2018   Sensorineural hearing loss (SNHL) of both ears 03/13/2017   Gastroesophageal reflux disease 06/26/2016   Diabetic polyneuropathy associated with type 2 diabetes mellitus (HCC) 01/10/2016   Essential tremor 10/22/2015   Hyperlipidemia LDL goal <100 10/22/2015    Orientation RESPIRATION BLADDER Height & Weight     Self  Normal Continent Weight: 120 lb 9.5 oz (54.7 kg) Height:     BEHAVIORAL SYMPTOMS/MOOD NEUROLOGICAL BOWEL NUTRITION STATUS      Continent Diet (Mechanical Soft)   AMBULATORY STATUS COMMUNICATION OF NEEDS Skin   Extensive Assist Verbally Other (Comment) (Coccyx Wound)                       Personal Care Assistance Level of Assistance  Bathing, Dressing, Feeding Bathing Assistance: Maximum assistance Feeding assistance: Limited assistance Dressing Assistance: Maximum assistance     Functional Limitations Info             SPECIAL CARE FACTORS FREQUENCY                       Contractures      Additional Factors Info  Code Status, Allergies Code Status Info: DNR - Limited Allergies Info: Doxycycline Hyclate, Levofloxacin, Meloxicam, Naproxen-esomeprazole Mg, Nitrofurantoin, Esomeprazole, Naproxen, Other, Pneumococcal Polysaccharide Vaccine           Current Medications (09/08/2024):      Discharge Medications: STOP taking these medications     insulin  glargine-yfgn 100 UNIT/ML Pen Commonly known as: SEMGLEE     lidocaine  5 % Commonly known as: Lidoderm            TAKE these medications     acetaminophen  325 MG tablet Commonly known as: Tylenol  Take 2 tablets (650 mg total) by mouth every 6 (six) hours as needed. What changed: reasons to take this    amLODipine -benazepril  5-20 MG capsule Commonly known as: LOTREL Take 1 capsule by mouth at bedtime.    aspirin  EC 81 MG tablet Take 1 tablet (81 mg total) by mouth daily. Swallow whole.    atorvastatin  40 MG tablet  Commonly known as: LIPITOR Take 1 tablet (40 mg total) by mouth daily. What changed: when to take this    Daily Vite Tabs Take 1 tablet by mouth daily.    escitalopram  5 MG tablet Commonly known as: LEXAPRO  Take 5 mg by mouth at bedtime.    insulin  aspart 100 UNIT/ML FlexPen Commonly known as: NOVOLOG  Before each meal 3 times a day, 140-199 - 2 units, 200-250 - 4 units, 251-299 - 6 units,  300-349 - 8 units,  350 or above 10 units.    meclizine  12.5 MG tablet Commonly known as: ANTIVERT  Take 12.5 mg by mouth 3 (three) times daily as  needed for dizziness or nausea.    Melatonin 3 MG Tbdp Take 3 mg by mouth at bedtime.    mineral oil-hydrophilic petrolatum ointment Apply 1 Application topically 2 (two) times daily.    propranolol  10 MG tablet Commonly known as: INDERAL  Take 10 mg by mouth See admin instructions. Give 1 tablet (10mg ) by mouth twice daily for essential tremor. Hold for HR < 55 or BP < 90/60.    QUEtiapine  25 MG tablet Commonly known as: SEROQUEL  Take 1 tablet (25 mg total) by mouth at bedtime.    Senna-Tabs 8.6 MG tablet Generic drug: senna Take 1 tablet by mouth every other day.    THERATEARS OP Apply 1 drop to eye 3 (three) times daily.    Vitamin D  (Ergocalciferol ) 1.25 MG (50000 UNIT) Caps capsule Commonly known as: DRISDOL Take 50,000 Units by mouth once a week.    Vitamin D3 50 MCG (2000 UT) Tabs Take 1 tablet by mouth daily.    Voltaren  1 % Gel Generic drug: diclofenac  Sodium Apply 2 g topically 2 (two) times daily.      Relevant Imaging Results:  Relevant Lab Results:   Additional Information SSN:[id number], hospice to follow at facility  [facility]

## 2024-09-08 NOTE — TOC Progression Note (Addendum)
 Transition of Care [facility]) - Progression Note    Patient Details  Name: Kiara Blair MRN: [id number] Date of Birth: March 10, 1930  [facility] CM/SW Contact  Inocente GORMAN Kindle, LCSW Phone Number: 09/08/2024, 12:42 PM  Clinical Narrative:    CSW spoke with LaSandra at [facility] and she confirmed receipt of the [facility] and DC Summary and they can accept patient back today. CSW updated [facility]. Will arrange [facility] for transport.    Expected Discharge Plan: Memory Care Barriers to Discharge: No Barriers Identified               Expected Discharge Plan and Services In-house Referral: Clinical Social Work     Living arrangements for the past 2 months: Assisted Living Facility (Memory care) Expected Discharge Date: 09/08/24                                     Social Drivers of Health (SDOH) Interventions SDOH Screenings   Food Insecurity: Patient Unable To Answer (09/05/2024)  Housing: Unknown (09/05/2024)  Transportation Needs: Patient Unable To Answer (09/05/2024)  Utilities: Patient Unable To Answer (09/05/2024)  Financial Resource Strain: Low Risk  (01/02/2024)   Received from [facility]  Physical Activity: Sufficiently Active (06/15/2020)   [facility]  Social Connections: Patient Unable To Answer (09/05/2024)  Stress: Stress Concern Present (06/15/2020)   Received from [facility]  Tobacco Use: Low Risk  (07/19/2024)    Readmission Risk Interventions    05/06/2024   10:00 AM 04/24/2024   10:30 AM  Readmission Risk Prevention Plan  Transportation Screening Complete Complete  PCP or Specialist Appt within 3-5 Days  Complete  HRI or Home Care Consult  Complete  Social Work Consult for Recovery Care Planning/Counseling  Complete  Palliative Care Screening  Not Applicable  Medication Review Oceanographer) Complete Complete  PCP or Specialist appointment within 3-5 days of discharge Complete   HRI or Home Care Consult Complete    SW Recovery Care/Counseling Consult Complete   Palliative Care Screening Complete   Skilled Nursing Facility Not Applicable

## 2024-09-10 LAB — CULTURE, BLOOD (ROUTINE X 2): Culture: NO GROWTH
